# Patient Record
Sex: Female | Born: 1955 | Race: Black or African American | Hispanic: No | Marital: Single | Smoking: Former smoker
Health system: Southern US, Community
[De-identification: ages and names within clinical notes are randomized; demographics above are authoritative.]

## PROBLEM LIST (undated history)

## (undated) DIAGNOSIS — E7800 Pure hypercholesterolemia, unspecified: Secondary | ICD-10-CM

## (undated) DIAGNOSIS — I10 Essential (primary) hypertension: Secondary | ICD-10-CM

## (undated) DIAGNOSIS — E119 Type 2 diabetes mellitus without complications: Secondary | ICD-10-CM

## (undated) DIAGNOSIS — Z944 Liver transplant status: Secondary | ICD-10-CM

## (undated) HISTORY — PX: LIVER TRANSPLANTATION: SUR1389

## (undated) HISTORY — DX: Essential (primary) hypertension: I10

## (undated) HISTORY — DX: Pure hypercholesterolemia, unspecified: E78.00

---

## 1998-04-21 ENCOUNTER — Other Ambulatory Visit: Admission: RE | Admit: 1998-04-21 | Discharge: 1998-04-21 | Payer: Self-pay | Admitting: Gastroenterology

## 1998-12-13 ENCOUNTER — Ambulatory Visit (HOSPITAL_COMMUNITY): Admission: RE | Admit: 1998-12-13 | Discharge: 1998-12-13 | Payer: Self-pay | Admitting: Gastroenterology

## 2001-04-23 ENCOUNTER — Encounter (HOSPITAL_COMMUNITY): Admission: RE | Admit: 2001-04-23 | Discharge: 2001-05-28 | Payer: Self-pay | Admitting: *Deleted

## 2001-05-29 ENCOUNTER — Encounter (HOSPITAL_COMMUNITY): Admission: RE | Admit: 2001-05-29 | Discharge: 2001-08-27 | Payer: Self-pay | Admitting: *Deleted

## 2006-09-05 ENCOUNTER — Ambulatory Visit (HOSPITAL_COMMUNITY): Admission: RE | Admit: 2006-09-05 | Discharge: 2006-09-05 | Payer: Self-pay | Admitting: Internal Medicine

## 2010-05-06 ENCOUNTER — Inpatient Hospital Stay (HOSPITAL_COMMUNITY): Admission: EM | Admit: 2010-05-06 | Discharge: 2010-05-08 | Payer: Self-pay | Admitting: Emergency Medicine

## 2010-09-20 ENCOUNTER — Ambulatory Visit (HOSPITAL_COMMUNITY)
Admission: RE | Admit: 2010-09-20 | Discharge: 2010-09-20 | Payer: Self-pay | Source: Home / Self Care | Admitting: Internal Medicine

## 2011-01-14 LAB — COMPREHENSIVE METABOLIC PANEL
ALT: 14 U/L (ref 0–35)
ALT: 16 U/L (ref 0–35)
AST: 24 U/L (ref 0–37)
Albumin: 3.4 g/dL — ABNORMAL LOW (ref 3.5–5.2)
Alkaline Phosphatase: 112 U/L (ref 39–117)
CO2: 21 mEq/L (ref 19–32)
CO2: 23 mEq/L (ref 19–32)
CO2: 26 mEq/L (ref 19–32)
Calcium: 9.9 mg/dL (ref 8.4–10.5)
Chloride: 108 mEq/L (ref 96–112)
Creatinine, Ser: 0.9 mg/dL (ref 0.4–1.2)
Creatinine, Ser: 1.27 mg/dL — ABNORMAL HIGH (ref 0.4–1.2)
GFR calc Af Amer: >60 mL/min (ref >60)
GFR calc non Af Amer: 44 mL/min — ABNORMAL LOW (ref >60)
GFR calc non Af Amer: >60 mL/min (ref >60)
Glucose, Bld: 292 mg/dL — ABNORMAL HIGH (ref 70–99)
Glucose, Bld: 867 mg/dL (ref 70–99)
Potassium: 3.2 mEq/L — ABNORMAL LOW (ref 3.5–5.1)
Potassium: 4 mEq/L (ref 3.5–5.1)
Sodium: 138 mEq/L (ref 135–145)
Sodium: 140 mEq/L (ref 135–145)
Total Bilirubin: 0.8 mg/dL (ref 0.3–1.2)
Total Bilirubin: 0.9 mg/dL (ref 0.3–1.2)
Total Bilirubin: 1.7 mg/dL — ABNORMAL HIGH (ref 0.3–1.2)

## 2011-01-14 LAB — URINALYSIS, ROUTINE W REFLEX MICROSCOPIC
Bilirubin Urine: NEGATIVE
Glucose, UA: >1000 mg/dL — AB
Leukocytes, UA: NEGATIVE
Protein, ur: NEGATIVE mg/dL

## 2011-01-14 LAB — CBC
HCT: 35.3 % — ABNORMAL LOW (ref 36.0–46.0)
HCT: 44.8 % (ref 36.0–46.0)
Hemoglobin: 12.7 g/dL (ref 12.0–15.0)
Hemoglobin: 14.5 g/dL (ref 12.0–15.0)
Hemoglobin: 15.7 g/dL — ABNORMAL HIGH (ref 12.0–15.0)
MCH: 31.5 pg (ref 26.0–34.0)
MCHC: 35 g/dL (ref 30.0–36.0)
MCV: 88.8 fL (ref 78.0–100.0)
MCV: 89.8 fL (ref 78.0–100.0)
Platelets: 104 10*3/uL — ABNORMAL LOW (ref 150–400)
RBC: 4.59 MIL/uL (ref 3.87–5.11)
WBC: 3.3 10*3/uL — ABNORMAL LOW (ref 4.0–10.5)
WBC: 7 10*3/uL (ref 4.0–10.5)

## 2011-01-14 LAB — GLUCOSE, CAPILLARY
Glucose-Capillary: 128 mg/dL — ABNORMAL HIGH (ref 70–99)
Glucose-Capillary: 132 mg/dL — ABNORMAL HIGH (ref 70–99)
Glucose-Capillary: 137 mg/dL — ABNORMAL HIGH (ref 70–99)
Glucose-Capillary: 151 mg/dL — ABNORMAL HIGH (ref 70–99)
Glucose-Capillary: 175 mg/dL — ABNORMAL HIGH (ref 70–99)
Glucose-Capillary: 193 mg/dL — ABNORMAL HIGH (ref 70–99)
Glucose-Capillary: 228 mg/dL — ABNORMAL HIGH (ref 70–99)
Glucose-Capillary: 237 mg/dL — ABNORMAL HIGH (ref 70–99)
Glucose-Capillary: 272 mg/dL — ABNORMAL HIGH (ref 70–99)
Glucose-Capillary: 288 mg/dL — ABNORMAL HIGH (ref 70–99)
Glucose-Capillary: 322 mg/dL — ABNORMAL HIGH (ref 70–99)
Glucose-Capillary: >600 mg/dL (ref 70–99)

## 2011-01-14 LAB — DIFFERENTIAL
Basophils Absolute: 0.1 10*3/uL (ref 0.0–0.1)
Eosinophils Absolute: 0.1 10*3/uL (ref 0.0–0.7)
Lymphocytes Relative: 13 % (ref 12–46)
Lymphs Abs: 0.8 10*3/uL (ref 0.7–4.0)
Neutrophils Relative %: 77 % (ref 43–77)

## 2011-01-14 LAB — APTT: aPTT: 27 seconds (ref 24–37)

## 2011-01-14 LAB — LIPID PANEL
Cholesterol: 180 mg/dL (ref 0–200)
LDL Cholesterol: 65 mg/dL (ref 0–99)
Total CHOL/HDL Ratio: 4.9 RATIO
Triglycerides: 389 mg/dL — ABNORMAL HIGH (ref <150)

## 2011-01-14 LAB — URINE CULTURE

## 2011-01-14 LAB — PROTIME-INR: Prothrombin Time: 13.7 seconds (ref 11.6–15.2)

## 2011-01-14 LAB — LIPASE, BLOOD: Lipase: 36 U/L (ref 11–59)

## 2011-01-14 LAB — MICROALBUMIN, URINE: Microalb, Ur: 2.82 mg/dL — ABNORMAL HIGH (ref 0.00–1.89)

## 2011-01-14 LAB — MAGNESIUM: Magnesium: 1.7 mg/dL (ref 1.5–2.5)

## 2011-10-04 ENCOUNTER — Other Ambulatory Visit (HOSPITAL_COMMUNITY): Payer: Self-pay | Admitting: Family Medicine

## 2011-10-04 DIAGNOSIS — Z1231 Encounter for screening mammogram for malignant neoplasm of breast: Secondary | ICD-10-CM

## 2011-11-12 ENCOUNTER — Ambulatory Visit (HOSPITAL_COMMUNITY)
Admission: RE | Admit: 2011-11-12 | Discharge: 2011-11-12 | Disposition: A | Discharge status: Home / Self Care | Payer: Medicare Other | Source: Ambulatory Visit | Attending: Family Medicine | Admitting: Family Medicine

## 2011-11-12 DIAGNOSIS — Z1231 Encounter for screening mammogram for malignant neoplasm of breast: Secondary | ICD-10-CM

## 2013-07-11 ENCOUNTER — Encounter (HOSPITAL_COMMUNITY): Payer: Self-pay | Admitting: Emergency Medicine

## 2013-07-11 ENCOUNTER — Inpatient Hospital Stay (HOSPITAL_COMMUNITY)
Admission: EM | Admit: 2013-07-11 | Discharge: 2013-07-13 | DRG: 638 | Disposition: A | Discharge status: Home / Self Care | Payer: Medicare Other | Attending: Internal Medicine | Admitting: Internal Medicine

## 2013-07-11 ENCOUNTER — Emergency Department (HOSPITAL_COMMUNITY): Payer: Medicare Other

## 2013-07-11 DIAGNOSIS — Z944 Liver transplant status: Secondary | ICD-10-CM

## 2013-07-11 DIAGNOSIS — E1165 Type 2 diabetes mellitus with hyperglycemia: Secondary | ICD-10-CM

## 2013-07-11 DIAGNOSIS — E860 Dehydration: Secondary | ICD-10-CM | POA: Diagnosis present

## 2013-07-11 DIAGNOSIS — Z794 Long term (current) use of insulin: Secondary | ICD-10-CM

## 2013-07-11 DIAGNOSIS — IMO0001 Reserved for inherently not codable concepts without codable children: Principal | ICD-10-CM | POA: Diagnosis present

## 2013-07-11 DIAGNOSIS — R7309 Other abnormal glucose: Secondary | ICD-10-CM

## 2013-07-11 DIAGNOSIS — B1920 Unspecified viral hepatitis C without hepatic coma: Secondary | ICD-10-CM | POA: Diagnosis present

## 2013-07-11 DIAGNOSIS — Z79899 Other long term (current) drug therapy: Secondary | ICD-10-CM

## 2013-07-11 DIAGNOSIS — R739 Hyperglycemia, unspecified: Secondary | ICD-10-CM

## 2013-07-11 HISTORY — DX: Type 2 diabetes mellitus without complications: E11.9

## 2013-07-11 LAB — CBC WITH DIFFERENTIAL/PLATELET
Basophils Relative: 0 % (ref 0–1)
Eosinophils Absolute: 0.2 10*3/uL (ref 0.0–0.7)
Eosinophils Relative: 2 % (ref 0–5)
HCT: 42.3 % (ref 36.0–46.0)
Lymphocytes Relative: 23 % (ref 12–46)
Lymphs Abs: 1.9 10*3/uL (ref 0.7–4.0)
Monocytes Absolute: 0.6 10*3/uL (ref 0.1–1.0)
Neutro Abs: 5.7 10*3/uL (ref 1.7–7.7)
RBC: 4.94 MIL/uL (ref 3.87–5.11)
RDW: 13.5 % (ref 11.5–15.5)
WBC: 8.4 10*3/uL (ref 4.0–10.5)

## 2013-07-11 LAB — URINALYSIS, ROUTINE W REFLEX MICROSCOPIC
Bilirubin Urine: NEGATIVE
Glucose, UA: >1000 mg/dL — AB
Ketones, ur: NEGATIVE mg/dL
Protein, ur: NEGATIVE mg/dL
Urobilinogen, UA: 0.2 mg/dL (ref 0.0–1.0)

## 2013-07-11 LAB — COMPREHENSIVE METABOLIC PANEL
BUN: 25 mg/dL — ABNORMAL HIGH (ref 6–23)
CO2: 21 mEq/L (ref 19–32)
Calcium: 9.2 mg/dL (ref 8.4–10.5)
Creatinine, Ser: 0.93 mg/dL (ref 0.50–1.10)
GFR calc Af Amer: 78 mL/min — ABNORMAL LOW (ref >90)
GFR calc non Af Amer: 67 mL/min — ABNORMAL LOW (ref >90)
Glucose, Bld: 654 mg/dL (ref 70–99)
Sodium: 128 mEq/L — ABNORMAL LOW (ref 135–145)
Total Protein: 8.4 g/dL — ABNORMAL HIGH (ref 6.0–8.3)

## 2013-07-11 LAB — BASIC METABOLIC PANEL
BUN: 17 mg/dL (ref 6–23)
CO2: 24 mEq/L (ref 19–32)
Calcium: 8.6 mg/dL (ref 8.4–10.5)
Creatinine, Ser: 0.74 mg/dL (ref 0.50–1.10)
Glucose, Bld: 248 mg/dL — ABNORMAL HIGH (ref 70–99)
Potassium: 3.2 mEq/L — ABNORMAL LOW (ref 3.5–5.1)
Sodium: 138 mEq/L (ref 135–145)

## 2013-07-11 LAB — CBC
Hemoglobin: 14.1 g/dL (ref 12.0–15.0)
MCH: 30.6 pg (ref 26.0–34.0)
MCHC: 35.8 g/dL (ref 30.0–36.0)
RDW: 13.4 % (ref 11.5–15.5)

## 2013-07-11 LAB — GLUCOSE, CAPILLARY
Glucose-Capillary: >600 mg/dL (ref 70–99)
Glucose-Capillary: 430 mg/dL — ABNORMAL HIGH (ref 70–99)
Glucose-Capillary: 197 mg/dL — ABNORMAL HIGH (ref 70–99)

## 2013-07-11 LAB — URINE MICROSCOPIC-ADD ON

## 2013-07-11 MED ORDER — SODIUM CHLORIDE 0.9 % IV BOLUS (SEPSIS)
1000.0000 mL | Freq: Once | INTRAVENOUS | Status: AC
  Administered 2013-07-11: 1000 mL via INTRAVENOUS

## 2013-07-11 MED ORDER — ENOXAPARIN SODIUM 40 MG/0.4ML ~~LOC~~ SOLN
40.0000 mg | Freq: Every day | SUBCUTANEOUS | Status: DC
  Administered 2013-07-11 – 2013-07-12 (×2): 40 mg via SUBCUTANEOUS
  Filled 2013-07-11 (×3): qty 0.4

## 2013-07-11 MED ORDER — ASPIRIN 81 MG PO CHEW
81.0000 mg | CHEWABLE_TABLET | Freq: Every day | ORAL | Status: DC
  Administered 2013-07-12 – 2013-07-13 (×2): 81 mg via ORAL
  Filled 2013-07-11 (×2): qty 1

## 2013-07-11 MED ORDER — POTASSIUM CHLORIDE CRYS ER 20 MEQ PO TBCR
40.0000 meq | EXTENDED_RELEASE_TABLET | Freq: Once | ORAL | Status: AC
  Administered 2013-07-11: 40 meq via ORAL
  Filled 2013-07-11: qty 2

## 2013-07-11 MED ORDER — DEXTROSE-NACL 5-0.45 % IV SOLN
INTRAVENOUS | Status: DC
  Administered 2013-07-11: 21:00:00 via INTRAVENOUS

## 2013-07-11 MED ORDER — AMLODIPINE BESYLATE 10 MG PO TABS
10.0000 mg | ORAL_TABLET | Freq: Every day | ORAL | Status: DC
  Administered 2013-07-12 – 2013-07-13 (×2): 10 mg via ORAL
  Filled 2013-07-11 (×2): qty 1

## 2013-07-11 MED ORDER — AZATHIOPRINE 50 MG PO TABS
50.0000 mg | ORAL_TABLET | Freq: Every day | ORAL | Status: DC
  Administered 2013-07-12 – 2013-07-13 (×2): 50 mg via ORAL
  Filled 2013-07-11 (×3): qty 1

## 2013-07-11 MED ORDER — SODIUM CHLORIDE 0.9 % IV SOLN
INTRAVENOUS | Status: DC
  Administered 2013-07-11: 5.9 [IU]/h via INTRAVENOUS
  Administered 2013-07-11: 5.2 [IU]/h via INTRAVENOUS
  Administered 2013-07-11: 2.8 [IU]/h via INTRAVENOUS
  Administered 2013-07-11: 5 [IU]/h via INTRAVENOUS
  Filled 2013-07-11: qty 1

## 2013-07-11 MED ORDER — INSULIN ASPART PROT & ASPART (70-30 MIX) 100 UNIT/ML ~~LOC~~ SUSP
15.0000 [IU] | Freq: Once | SUBCUTANEOUS | Status: DC
  Filled 2013-07-11: qty 10

## 2013-07-11 MED ORDER — PROPRANOLOL HCL 20 MG PO TABS
20.0000 mg | ORAL_TABLET | Freq: Two times a day (BID) | ORAL | Status: DC
  Administered 2013-07-11 – 2013-07-13 (×4): 20 mg via ORAL
  Filled 2013-07-11 (×5): qty 1

## 2013-07-11 MED ORDER — DEXTROSE 50 % IV SOLN: 25.0000 mL | INTRAVENOUS | Status: DC | PRN

## 2013-07-11 MED ORDER — TACROLIMUS 1 MG PO CAPS
1.0000 mg | ORAL_CAPSULE | Freq: Two times a day (BID) | ORAL | Status: DC
  Administered 2013-07-11 – 2013-07-12 (×2): 1 mg via ORAL
  Filled 2013-07-11 (×3): qty 1

## 2013-07-11 MED ORDER — SODIUM CHLORIDE 0.9 % IV SOLN
INTRAVENOUS | Status: DC
  Filled 2013-07-11 (×2): qty 1000

## 2013-07-11 MED ORDER — PANTOPRAZOLE SODIUM 40 MG PO TBEC
40.0000 mg | DELAYED_RELEASE_TABLET | Freq: Every day | ORAL | Status: DC
  Administered 2013-07-12 – 2013-07-13 (×2): 40 mg via ORAL
  Filled 2013-07-11 (×2): qty 1

## 2013-07-11 MED ORDER — ADULT MULTIVITAMIN W/MINERALS CH
1.0000 | ORAL_TABLET | Freq: Every day | ORAL | Status: DC
  Administered 2013-07-12 – 2013-07-13 (×2): 1 via ORAL
  Filled 2013-07-11 (×2): qty 1

## 2013-07-11 MED ORDER — SODIUM CHLORIDE 0.9 % IV SOLN
INTRAVENOUS | Status: DC
  Administered 2013-07-11: 20:00:00 via INTRAVENOUS

## 2013-07-11 MED ORDER — PREDNISONE 5 MG PO TABS
5.0000 mg | ORAL_TABLET | Freq: Every day | ORAL | Status: DC
  Administered 2013-07-12 – 2013-07-13 (×2): 5 mg via ORAL
  Filled 2013-07-11 (×3): qty 1

## 2013-07-11 MED ORDER — POTASSIUM CHLORIDE 10 MEQ/100ML IV SOLN
10.0000 meq | INTRAVENOUS | Status: AC
  Administered 2013-07-11 (×2): 10 meq via INTRAVENOUS
  Filled 2013-07-11: qty 100

## 2013-07-11 NOTE — ED Notes (Signed)
Critical lab value CBG 654

## 2013-07-11 NOTE — ED Provider Notes (Signed)
CSN: [id number]     Arrival date & time 07/11/13  1427 History   First MD Initiated Contact with Patient 07/11/13 1504     Chief Complaint  Patient presents with  . Hyperglycemia   (Consider location/radiation/quality/duration/timing/severity/associated sxs/prior Treatment) HPI Comments: Is a 57 year old female with history of diabetes, liver transplant, hepatitis C Presents today with 2 weeks of home a blood sugar readings of high. She has been measuring her blood sugar 3 times daily and each time her blood sugar has been too high to read. She takes 50 units of Lantus at bedtime and NovoLog on a sliding scale. She's been compliant with these medications. She sees a endocrinologist at [facility]. Her next appointment is in October. She reports no recent illness. No new medications. She did recently start taking a multivitamin one month ago. She is symptomatic with polydipsia. No polyuria or polyphagia. She has had a decrease in appetite. Today she ate shrimp, salad, a baked potato.  The history is provided by the patient. No language interpreter was used.    Past Medical History  Diagnosis Date  . Diabetes mellitus without complication    Past Surgical History  Procedure Laterality Date  . Liver transplantation     No family history on file. History  Substance Use Topics  . Smoking status: Not on file  . Smokeless tobacco: Not on file  . Alcohol Use: Not on file   OB History   Grav Para Term Preterm Abortions TAB SAB Ect Mult Living                 Review of Systems  Constitutional: Positive for appetite change. Negative for fever and chills.  Endocrine: Positive for polydipsia. Negative for polyphagia and polyuria.  All other systems reviewed and are negative.    Allergies  Review of patient's allergies indicates no known allergies.  Home Medications   Current Outpatient Rx  Name  Route  Sig  Dispense  Refill  . amLODipine (NORVASC) 10 MG tablet   Oral   Take 10 mg by  mouth daily.         [name] aspirin 81 MG chewable tablet   Oral   Chew 81 mg by mouth daily.         [name] azaTHIOprine (IMURAN) 50 MG tablet   Oral   Take 50 mg by mouth daily.         . insulin aspart (NOVOLOG) 100 UNIT/ML injection   Subcutaneous   Inject 2-6 Units into the skin 3 (three) times daily with meals. Per sliding scale         . insulin glargine (LANTUS) 100 UNIT/ML injection   Subcutaneous   Inject 0-55 Units into the skin at bedtime. Per sliding scale         . Multiple Vitamin (MULTIVITAMIN WITH MINERALS) TABS tablet   Oral   Take 1 tablet by mouth daily.         [name] omeprazole (PRILOSEC) 20 MG capsule   Oral   Take 20 mg by mouth daily.         . potassium chloride SA (K-DUR,KLOR-CON) 20 MEQ tablet   Oral   Take 20 mEq by mouth daily.         . predniSONE (DELTASONE) 5 MG tablet   Oral   Take 5 mg by mouth daily.         . propranolol (INDERAL) 20 MG tablet   Oral   Take 20 mg by  mouth 2 (two) times daily.         . tacrolimus (PROGRAF) 1 MG capsule   Oral   Take 1 mg by mouth 2 (two) times daily.          BP 134/91  Pulse 89  Temp(Src) 98.5 F (36.9 C) (Oral)  Resp 16  SpO2 97% Physical Exam  Nursing note and vitals reviewed. Constitutional: She is oriented to person, place, and time. She appears well-developed and well-nourished. No distress.  HENT:  Head: Normocephalic and atraumatic.  Right Ear: External ear normal.  Left Ear: External ear normal.  Nose: Nose normal.  Mouth/Throat: Oropharynx is clear and moist. Mucous membranes are dry.  Eyes: Conjunctivae are normal.  Neck: Normal range of motion.  Cardiovascular: Normal rate, regular rhythm and normal heart sounds.   Pulmonary/Chest: Effort normal and breath sounds normal. No stridor. No respiratory distress. She has no wheezes. She has no rales.  Abdominal: Soft. She exhibits no distension.  Musculoskeletal: Normal range of motion.  Neurological: She is alert and  oriented to person, place, and time. She has normal strength.  Skin: Skin is warm and dry. She is not diaphoretic. No erythema.  Psychiatric: She has a normal mood and affect. Her behavior is normal.    ED Course  Procedures (including critical care time) Labs Review Labs Reviewed  GLUCOSE, CAPILLARY - Abnormal; Notable for the following:    Glucose-Capillary >600 (*)    All other components within normal limits  CBC WITH DIFFERENTIAL - Abnormal; Notable for the following:    Hemoglobin 15.4 (*)    MCHC 36.4 (*)    Platelets 130 (*)    All other components within normal limits  URINALYSIS, ROUTINE W REFLEX MICROSCOPIC - Abnormal; Notable for the following:    Specific Gravity, Urine 1.033 (*)    Glucose, UA >1000 (*)    Hgb urine dipstick TRACE (*)    All other components within normal limits  COMPREHENSIVE METABOLIC PANEL - Abnormal; Notable for the following:    Sodium 128 (*)    Chloride 92 (*)    Glucose, Bld 654 (*)    BUN 25 (*)    Total Protein 8.4 (*)    Alkaline Phosphatase 196 (*)    GFR calc non Af Amer 67 (*)    GFR calc Af Amer 78 (*)    All other components within normal limits  GLUCOSE, CAPILLARY - Abnormal; Notable for the following:    Glucose-Capillary 430 (*)    All other components within normal limits  GLUCOSE, CAPILLARY - Abnormal; Notable for the following:    Glucose-Capillary 340 (*)    All other components within normal limits  URINE MICROSCOPIC-ADD ON   Imaging Review Dg Chest 2 View  07/11/2013   *RADIOLOGY REPORT*  Clinical Data: Hyperglycemic, hypertension.  CHEST - 2 VIEW  Comparison: May 07, 2010.  Findings: Cardiomediastinal silhouette appears normal.  No acute pulmonary disease is noted.  Bony thorax is intact.  IMPRESSION: No acute cardiopulmonary abnormality seen.   Original Report Authenticated By: Lupita Raider.,  M.D.    MDM  No diagnosis found.  Patient presents to ED with 2 weeks of hyperglycemia. On arrival CBG >600. She has  been taking both her lantus and novalog at home. No new medications, no recent illness. Patient was placed on glucose stabilizer in ED to which she responded well. Discussed case with Dr. Elvera Lennox who agrees to admit to step down. Admission appreciated. Discussed case  with Dr. Denton Lank who agrees with plan. Vital signs stable. Patient / Family / Caregiver informed of clinical course, understand medical decision-making process, and agree with plan.     Mora Bellman, PA-C 07/11/13 [phone]

## 2013-07-11 NOTE — H&P (Signed)
[facility] History and Physical  Kathryn Walsh GNF:[id number] DOB: 04-19-1956 DOA: 07/11/2013  Referring physician: Dr. Denton Lank PCP: No primary provider on file.  Specialists: none  Chief Complaint: elevated blood sugars  HPI: Kathryn Walsh is a 57 y.o. female has a past medical history significant for insulin-dependent diabetes, liver transplant in 1999, hepatitis C, presents today with a chief complaint of high blood sugars that she hasn't been able to control over the past 2 weeks. She usually takes Lantus sliding scale, and despite her reported compliance with all these medications her sugars have been running in the 3 to 400s over the past couple weeks. She denies any fever or chills. She endorses mild nausea but denies vomiting. She has no abdominal pain. She denies shortness of breath. She had an episode like this a few years back that required hospitalization. She endorses increased thirst. She has no chest pain. She has no lightheadedness or dizziness. She denies any dysuria. She reports no medication changes. She stores her insulin in the refrigerator, and her recent insulin vial is new. No dietary changes.   Review of Systems: as per history of present illness, otherwise negative  Past Medical History  Diagnosis Date  . Diabetes mellitus without complication    Past Surgical History  Procedure Laterality Date  . Liver transplantation     Social History:  has no tobacco, alcohol, and drug history on file.  No Known Allergies  Family history non contributory  Prior to Admission medications   Medication Sig Start Date End Date Taking? Authorizing Provider  amLODipine (NORVASC) 10 MG tablet Take 10 mg by mouth daily.   Yes Historical Provider, MD  aspirin 81 MG chewable tablet Chew 81 mg by mouth daily.   Yes Historical Provider, MD  azaTHIOprine (IMURAN) 50 MG tablet Take 50 mg by mouth daily.   Yes Historical Provider, MD  insulin aspart (NOVOLOG) 100 UNIT/ML  injection Inject 2-6 Units into the skin 3 (three) times daily with meals. Per sliding scale   Yes Historical Provider, MD  insulin glargine (LANTUS) 100 UNIT/ML injection Inject 0-55 Units into the skin at bedtime. Per sliding scale   Yes Historical Provider, MD  Multiple Vitamin (MULTIVITAMIN WITH MINERALS) TABS tablet Take 1 tablet by mouth daily.   Yes Historical Provider, MD  omeprazole (PRILOSEC) 20 MG capsule Take 20 mg by mouth daily.   Yes Historical Provider, MD  potassium chloride SA (K-DUR,KLOR-CON) 20 MEQ tablet Take 20 mEq by mouth daily.   Yes Historical Provider, MD  predniSONE (DELTASONE) 5 MG tablet Take 5 mg by mouth daily.   Yes Historical Provider, MD  propranolol (INDERAL) 20 MG tablet Take 20 mg by mouth 2 (two) times daily.   Yes Historical Provider, MD  tacrolimus (PROGRAF) 1 MG capsule Take 1 mg by mouth 2 (two) times daily.   Yes Historical Provider, MD   Physical Exam: Filed Vitals:   07/11/13 1442 07/11/13 2005  BP: 134/91 125/73  Pulse: 89 79  Temp: 98.5 F (36.9 C) 98.6 F (37 C)  TempSrc: Oral Oral  Resp: 16 17  SpO2: 97% 96%     General:  No apparent distress  Eyes: PERRL, EOMI, no scleral icterus  ENT: moist oropharynx  Neck: supple, no JVD  Cardiovascular: regular rate without MRG; 2+ peripheral pulses  Respiratory: CTA biL, good air movement without wheezing, rhonchi or crackles  Abdomen: soft, non tender to palpation, positive bowel sounds, no guarding, no rebound  Skin: no rashes  Musculoskeletal:  no peripheral edema  Psychiatric: normal mood and affect  Neurologic: non focal  Labs on Admission:  Basic Metabolic Panel:  Recent Labs Lab 07/11/13 1518  NA 128*  K 4.3  CL 92*  CO2 21  GLUCOSE 654*  BUN 25*  CREATININE 0.93  CALCIUM 9.2   Liver Function Tests:  Recent Labs Lab 07/11/13 1518  AST 14  ALT 9  ALKPHOS 196*  BILITOT 0.6  PROT 8.4*  ALBUMIN 3.7   CBC:  Recent Labs Lab 07/11/13 1515  WBC 8.4   NEUTROABS 5.7  HGB 15.4*  HCT 42.3  MCV 85.6  PLT 130*   CBG:  Recent Labs Lab 07/11/13 1449 07/11/13 1731 07/11/13 1816 07/11/13 1943  GLUCAP >600* 430* 340* 321*    Radiological Exams on Admission: Dg Chest 2 View  07/11/2013   *RADIOLOGY REPORT*  Clinical Data: Hyperglycemic, hypertension.  CHEST - 2 VIEW  Comparison: May 07, 2010.  Findings: Cardiomediastinal silhouette appears normal.  No acute pulmonary disease is noted.  Bony thorax is intact.  IMPRESSION: No acute cardiopulmonary abnormality seen.   Original Report Authenticated By: Lupita Raider.,  M.D.    EKG: Independently reviewed.  Assessment/Plan Active Problems:   S/P liver transplant   HCV (hepatitis C virus)   Type II diabetes mellitus, uncontrolled   Hyperglycemia  Hyperglycemia - insulin stabilizer, SDU - bicarb 21, no ketones in urine. - unclear precipitant, no evidence of infection, UA clear, CXR normal, afebrile, no leukocytosis.  DM - check HBA1C, insulin drip, transition to Lantus once sugars better. NPO for now. S/p Liver tx - continue home medications, check tacrolimus level. Followed as an outpatient at [facility]. DVT prophylaxis - lovenox  Code Status: Full  Family Communication: none  Disposition Plan: SDU  Time spent: 20  Costin M. Elvera Lennox, MD [facility] Pager [phone]  If 7PM-7AM, please contact night-coverage [email or web address] Password [facility] 07/11/2013, 8:31 PM

## 2013-07-11 NOTE — ED Notes (Signed)
Pt here for c/o blood elevated x2 weeks even after taking scheduled Insulin denies illness blood sugar over 600

## 2013-07-12 ENCOUNTER — Encounter (HOSPITAL_COMMUNITY): Payer: Self-pay | Admitting: *Deleted

## 2013-07-12 DIAGNOSIS — B1920 Unspecified viral hepatitis C without hepatic coma: Secondary | ICD-10-CM

## 2013-07-12 LAB — BASIC METABOLIC PANEL
BUN: 14 mg/dL (ref 6–23)
BUN: 15 mg/dL (ref 6–23)
CO2: 23 mEq/L (ref 19–32)
CO2: 23 mEq/L (ref 19–32)
Calcium: 8.5 mg/dL (ref 8.4–10.5)
Calcium: 8.6 mg/dL (ref 8.4–10.5)
Chloride: 105 mEq/L (ref 96–112)
Creatinine, Ser: 0.58 mg/dL (ref 0.50–1.10)
Creatinine, Ser: 0.63 mg/dL (ref 0.50–1.10)
Creatinine, Ser: 0.64 mg/dL (ref 0.50–1.10)
GFR calc non Af Amer: >90 mL/min (ref >90)
Glucose, Bld: 141 mg/dL — ABNORMAL HIGH (ref 70–99)
Glucose, Bld: 180 mg/dL — ABNORMAL HIGH (ref 70–99)
Potassium: 3.4 mEq/L — ABNORMAL LOW (ref 3.5–5.1)
Potassium: 3.7 mEq/L (ref 3.5–5.1)

## 2013-07-12 LAB — GLUCOSE, CAPILLARY
Glucose-Capillary: 220 mg/dL — ABNORMAL HIGH (ref 70–99)
Glucose-Capillary: 187 mg/dL — ABNORMAL HIGH (ref 70–99)
Glucose-Capillary: 131 mg/dL — ABNORMAL HIGH (ref 70–99)
Glucose-Capillary: 158 mg/dL — ABNORMAL HIGH (ref 70–99)
Glucose-Capillary: 359 mg/dL — ABNORMAL HIGH (ref 70–99)
Glucose-Capillary: 339 mg/dL — ABNORMAL HIGH (ref 70–99)
Glucose-Capillary: 270 mg/dL — ABNORMAL HIGH (ref 70–99)

## 2013-07-12 LAB — HEMOGLOBIN A1C: Hgb A1c MFr Bld: 11.8 % — ABNORMAL HIGH (ref <5.7)

## 2013-07-12 MED ORDER — LIVING WELL WITH DIABETES BOOK
Freq: Once | Status: AC
  Administered 2013-07-12: 06:00:00
  Filled 2013-07-12: qty 1

## 2013-07-12 MED ORDER — PNEUMOCOCCAL VAC POLYVALENT 25 MCG/0.5ML IJ INJ
0.5000 mL | INJECTION | INTRAMUSCULAR | Status: AC
  Administered 2013-07-13: 0.5 mL via INTRAMUSCULAR
  Filled 2013-07-12 (×2): qty 0.5

## 2013-07-12 MED ORDER — TACROLIMUS 1 MG PO CAPS
3.0000 mg | ORAL_CAPSULE | Freq: Two times a day (BID) | ORAL | Status: DC
  Administered 2013-07-12 – 2013-07-13 (×2): 3 mg via ORAL
  Filled 2013-07-12 (×3): qty 3

## 2013-07-12 MED ORDER — SODIUM CHLORIDE 0.9 % IV SOLN: INTRAVENOUS | Status: DC

## 2013-07-12 MED ORDER — INSULIN GLARGINE 100 UNIT/ML ~~LOC~~ SOLN
60.0000 [IU] | Freq: Every day | SUBCUTANEOUS | Status: DC
  Administered 2013-07-12: 60 [IU] via SUBCUTANEOUS
  Filled 2013-07-12 (×2): qty 0.6

## 2013-07-12 MED ORDER — TACROLIMUS 1 MG PO CAPS
2.0000 mg | ORAL_CAPSULE | Freq: Once | ORAL | Status: AC
  Administered 2013-07-12: 2 mg via ORAL
  Filled 2013-07-12: qty 2

## 2013-07-12 MED ORDER — INSULIN ASPART 100 UNIT/ML ~~LOC~~ SOLN
0.0000 [IU] | Freq: Three times a day (TID) | SUBCUTANEOUS | Status: DC
  Administered 2013-07-12: 5 [IU] via SUBCUTANEOUS
  Administered 2013-07-12: 11 [IU] via SUBCUTANEOUS
  Administered 2013-07-12: 15 [IU] via SUBCUTANEOUS
  Administered 2013-07-12: 3 [IU] via SUBCUTANEOUS
  Administered 2013-07-13: 8 [IU] via SUBCUTANEOUS
  Administered 2013-07-13: 5 [IU] via SUBCUTANEOUS

## 2013-07-12 MED ORDER — INSULIN GLARGINE 100 UNIT/ML ~~LOC~~ SOLN
10.0000 [IU] | Freq: Once | SUBCUTANEOUS | Status: AC
  Administered 2013-07-12: 10 [IU] via SUBCUTANEOUS
  Filled 2013-07-12: qty 0.1

## 2013-07-12 MED ORDER — INSULIN ASPART 100 UNIT/ML ~~LOC~~ SOLN
0.0000 [IU] | Freq: Every day | SUBCUTANEOUS | Status: DC
  Administered 2013-07-12: 3 [IU] via SUBCUTANEOUS

## 2013-07-12 MED ORDER — HYDROCODONE-ACETAMINOPHEN 7.5-325 MG/15ML PO SOLN
10.0000 mL | Freq: Four times a day (QID) | ORAL | Status: DC | PRN
  Administered 2013-07-12: 10 mL via ORAL
  Filled 2013-07-12: qty 15

## 2013-07-12 MED ORDER — SODIUM CHLORIDE 0.9 % IV SOLN
INTRAVENOUS | Status: DC
  Administered 2013-07-12: 1000 mL via INTRAVENOUS

## 2013-07-12 NOTE — Progress Notes (Signed)
[facility] PROGRESS NOTE  Kathryn Walsh UEA:[id number] DOB: August 23, 1956 DOA: 07/11/2013 PCP: No primary provider on file.  Assessment/Plan: 1. Uncontrolled Hyperglycemia -trigger unclear, no obvious source of infection -off glucomander -Resume Lantus, increase dose to 60Units QHS, takes 50units at home -Novolog SSI, resume diet -Fu hbaic -DM coordinator consult -continue IVF-NS for 6 more hours  2. S/p Liver transplant -continue Prograf takes 3mg  BID  -Followed by Isidor Holts at [facility], next FU later this month  3. Dehydration -continue IVF today, decrease rate  DVT proph: lovenox  Code Status: Full Family Communication: none at bedside Disposition Plan: TX to floor, home tomorrow     HPI/Subjective: Feels good, hungry, no complaints  Objective: Filed Vitals:   07/12/13 0805  BP:   Pulse:   Temp: 98.1 F (36.7 C)  Resp:     Intake/Output Summary (Last 24 hours) at 07/12/13 1023 Last data filed at 07/12/13 0900  Gross per 24 hour  Intake 1437.05 ml  Output    750 ml  Net 687.05 ml   Filed Weights   07/11/13 2005 07/12/13 0400  Weight: 94.2 kg (207 lb 10.8 oz) 95.5 kg (210 lb 8.6 oz)    Exam:   General: AAOx3, no distress  Cardiovascular:S1S2/RRR  Respiratory: CTAB  Chest: small draining sinus in R inframammary area  Abdomen: soft, NT, BS present  Musculoskeletal:no edema c/c  Data Reviewed: Basic Metabolic Panel:  Recent Labs Lab 07/11/13 1518 07/11/13 2055 07/11/13 2330 07/12/13 0127 07/12/13 0520  NA 128* 138 137 140 138  K 4.3 3.2* 3.2* 3.4* 3.7  CL 92* 104 105 109 105  CO2 21 24 23 21 23   GLUCOSE 654* 248* 180* 141* 177*  BUN 25* 17 15 14 13   CREATININE 0.93 0.74 0.64 0.58 0.63  CALCIUM 9.2 8.6 8.5 8.6 8.9   Liver Function Tests:  Recent Labs Lab 07/11/13 1518  AST 14  ALT 9  ALKPHOS 196*  BILITOT 0.6  PROT 8.4*  ALBUMIN 3.7   No results found for this basename: LIPASE, AMYLASE,  in the last 168  hours No results found for this basename: AMMONIA,  in the last 168 hours CBC:  Recent Labs Lab 07/11/13 1515 07/11/13 2055  WBC 8.4 5.0  NEUTROABS 5.7  --   HGB 15.4* 14.1  HCT 42.3 39.4  MCV 85.6 85.5  PLT 130* 94*   Cardiac Enzymes: No results found for this basename: CKTOTAL, CKMB, CKMBINDEX, TROPONINI,  in the last 168 hours BNP (last 3 results) No results found for this basename: PROBNP,  in the last 8760 hours CBG:  Recent Labs Lab 07/11/13 2356 07/12/13 0059 07/12/13 0202 07/12/13 0306 07/12/13 0831  GLUCAP 177* 135* 131* 144* 226*    Recent Results (from the past 240 hour(s))  MRSA PCR SCREENING     Status: None   Collection Time    07/11/13  8:03 PM      Result Value Range Status   MRSA by PCR NEGATIVE  NEGATIVE Final   Comment:            The GeneXpert MRSA Assay (FDA     approved for NASAL specimens     only), is one component of a     comprehensive MRSA colonization     surveillance program. It is not     intended to diagnose MRSA     infection nor to guide or     monitor treatment for     MRSA infections.  Studies: Dg Chest 2 View  07/11/2013   *RADIOLOGY REPORT*  Clinical Data: Hyperglycemic, hypertension.  CHEST - 2 VIEW  Comparison: May 07, 2010.  Findings: Cardiomediastinal silhouette appears normal.  No acute pulmonary disease is noted.  Bony thorax is intact.  IMPRESSION: No acute cardiopulmonary abnormality seen.   Original Report Authenticated By: Lupita Raider.,  M.D.    Scheduled Meds: . amLODipine  10 mg Oral Daily  . aspirin  81 mg Oral Daily  . azaTHIOprine  50 mg Oral Daily  . enoxaparin (LOVENOX) injection  40 mg Subcutaneous QHS  . insulin aspart  0-15 Units Subcutaneous TID WC  . insulin aspart  0-5 Units Subcutaneous QHS  . insulin glargine  60 Units Subcutaneous QHS  . multivitamin with minerals  1 tablet Oral Daily  . pantoprazole  40 mg Oral Daily  . [START ON 07/13/2013] pneumococcal 23 valent vaccine  0.5 mL  Intramuscular Tomorrow-1000  . predniSONE  5 mg Oral Q breakfast  . propranolol  20 mg Oral BID  . tacrolimus  3 mg Oral BID   Continuous Infusions: . sodium chloride      Active Problems:   S/P liver transplant   HCV (hepatitis C virus)   Type II diabetes mellitus, uncontrolled   Hyperglycemia    Time spent:    [facility]  [facility] Pager [phone]. If 7PM-7AM, please contact night-coverage at [email or web address], password [facility] 07/12/2013, 10:23 AM  LOS: 1 day

## 2013-07-13 LAB — BASIC METABOLIC PANEL
CO2: 21 mEq/L (ref 19–32)
Chloride: 105 mEq/L (ref 96–112)
Glucose, Bld: 235 mg/dL — ABNORMAL HIGH (ref 70–99)
Potassium: 3.4 mEq/L — ABNORMAL LOW (ref 3.5–5.1)
Sodium: 136 mEq/L (ref 135–145)

## 2013-07-13 LAB — CBC
Hemoglobin: 12.7 g/dL (ref 12.0–15.0)
Platelets: 79 10*3/uL — ABNORMAL LOW (ref 150–400)
RBC: 4.25 MIL/uL (ref 3.87–5.11)
WBC: 3.1 10*3/uL — ABNORMAL LOW (ref 4.0–10.5)

## 2013-07-13 LAB — GLUCOSE, CAPILLARY: Glucose-Capillary: 233 mg/dL — ABNORMAL HIGH (ref 70–99)

## 2013-07-13 MED ORDER — INSULIN ASPART 100 UNIT/ML ~~LOC~~ SOLN: 8.0000 [IU] | Freq: Three times a day (TID) | SUBCUTANEOUS | Status: DC

## 2013-07-13 MED ORDER — INSULIN GLARGINE 100 UNIT/ML ~~LOC~~ SOLN: 60.0000 [IU] | Freq: Every day | SUBCUTANEOUS | Status: DC

## 2013-07-13 MED ORDER — INSULIN ASPART 100 UNIT/ML ~~LOC~~ SOLN
4.0000 [IU] | Freq: Three times a day (TID) | SUBCUTANEOUS | Status: DC
  Administered 2013-07-13: 4 [IU] via SUBCUTANEOUS

## 2013-07-13 NOTE — Progress Notes (Signed)
Inpatient Diabetes Program Recommendations  AACE/ADA: New Consensus Statement on Inpatient Glycemic Control (2013)  Target Ranges:  Prepandial:   less than 140 mg/dL      Peak postprandial:   less than 180 mg/dL (1-2 hours)      Critically ill patients:  140 - 180 mg/dL   Reason for Visit: Diabetes Consult  57 year-old female with history of diabetes, liver transplant, hepatitis C Presents today with 2 weeks of home a blood sugar readings of high. She has been measuring her blood sugar 3 times daily and each time her blood sugar has been too high to read. She takes 50 units of Lantus at bedtime and NovoLog on a sliding scale.  Pt states she tries to follow good diet and has increased her Novolog at mealtime since blood sugars have been running high. Checks blood sugars at least 3 times/day.  Uses insulin pen for Lantus and Novolog; gives insulin in abdomen most of the time.  Keeps insulin pen at room temp and stores other pens in refrigerator.  States insulin is not on skin, therefore she knows it's "going in." Pt states she would like to change doctors and Clinical research associate stressed importance of f/u in the next week with a MD to manage her diabetes.  She has taken logbook with her to MD appts in the past, but states the last two times she went for her appt, the doctor did not look at her readings.  Results for LESLI, ISSA (MRN [id number]) as of 07/13/2013 11:39  Ref. Range 07/12/2013 08:31 07/12/2013 12:24 07/12/2013 17:10 07/12/2013 21:40 07/13/2013 07:22  Glucose-Capillary Latest Range: 70-99 mg/dL 409 (H) 811 (H) 914 (H) 270 (H) 233 (H)  Results for LLEWELLYN, SCHOENBERGER (MRN [id number]) as of 07/13/2013 11:39  Ref. Range 07/11/2013 20:55  Hemoglobin A1C Latest Range: <5.7 % 11.8 (H)   Blood sugars still running high, although improving. Received Lantus 10 units prior to discontinuation of insulin drip. Recommendations: Increase meal coverage insulin to Novolog 8 units tidwc if pt eats >50% meal. Would  not increase Lantus at this point. Continue to titrate up her meal coverage insulin until CBGs <180 mg/dL. May need assistance from case management for PCP to manage DM.  Will continue to follow while inpatient. Thank you. Ailene Ards, RD, LDN, CDE Inpatient Diabetes Coordinator [phone]

## 2013-07-13 NOTE — Progress Notes (Signed)
Pt. Was discharged home she was given her discharge instructions and all questions were answered. She was transported home by a friend.

## 2013-07-13 NOTE — Care Management Note (Signed)
Cm spoke with patient at the bedside concerning discharge planning. Pt states wants to change PCP. Pt provided CM permission to schedule an appt at [facility] on 9/24 at noon to establish a new PCP. Pt is independent. Pt provides own transportation. Pt uses [facility] on [address].   Roxy Manns Cortavius Montesinos,RN,MSN [phone]

## 2013-07-13 NOTE — Progress Notes (Signed)
INITIAL NUTRITION ASSESSMENT  DOCUMENTATION CODES Per approved criteria  -Obesity Unspecified   INTERVENTION: - Stressed importance of following diabetic diet - serving sizes discussed, recommended meals/snacks discussed, handouts provided. Pt familiar with information. Expect good compliance. - Will continue to monitor   NUTRITION DIAGNOSIS: Altered nutrition related lab values related to uncontrolled diabetes as evidenced by elevated blood sugars and HbA1c.   Goal: 1. CBGs < 200 mg/dL 2. Pt to consume >90% of meals  Monitor:  Weights, labs, intake, CBGs  Reason for Assessment: Nutrition risk   57 y.o. female  Admitting Dx: Elevated blood sugars  ASSESSMENT: Pt with history of insulin-dependent diabetes, liver transplant, and hepatitis C. Pt with elevated blood sugars x 2 weeks associated with poor appetite and blurred vision. Pt also c/o changes in taste. Pt has tried to force herself to eat 3 meals/day. Pt states she typically eats oatmeal, fruit, and coffee for breakfast, a handful of cashew nuts for lunch, and some greens for dinner. Pt reports drinking excess amounts of water but still drinking a lot. Potassium slightly low with elevated Alk phos. Unsure of usual weight but thinks it's around 200 pounds, admission weight was 210 pounds.   Lab Results  Component Value Date   HGBA1C 11.8* 07/11/2013    Height: Ht Readings from Last 1 Encounters:  07/12/13 5\' 7"  (1.702 m)    Weight: Wt Readings from Last 1 Encounters:  07/12/13 210 lb 8.6 oz (95.5 kg)    Ideal Body Weight: 135 lb   % Ideal Body Weight: 156%  Wt Readings from Last 10 Encounters:  07/12/13 210 lb 8.6 oz (95.5 kg)    Usual Body Weight: 200 lb per pt  % Usual Body Weight: 105%  BMI:  Body mass index is 32.97 kg/(m^2). Class I obesity  Estimated Nutritional Needs: Kcal: 1550-1750 Protein: 60-75g Fluid: 1.5-1.7L/day  Skin: Intact  Diet Order: Carb Control  EDUCATION NEEDS: -Education  needs addressed - discussed diabetic diet and provided handouts of this information    Intake/Output Summary (Last 24 hours) at 07/13/13 1221 Last data filed at 07/13/13 0900  Gross per 24 hour  Intake    720 ml  Output   1000 ml  Net   -280 ml    Last BM: 9/13  Labs:   Recent Labs Lab 07/12/13 0127 07/12/13 0520 07/13/13 0345  NA 140 138 136  K 3.4* 3.7 3.4*  CL 109 105 105  CO2 21 23 21   BUN 14 13 9   CREATININE 0.58 0.63 0.60  CALCIUM 8.6 8.9 8.6  GLUCOSE 141* 177* 235*    CBG (last 3)   Recent Labs  07/12/13 2140 07/13/13 0722 07/13/13 1208  GLUCAP 270* 233* 254*    Scheduled Meds: . amLODipine  10 mg Oral Daily  . aspirin  81 mg Oral Daily  . azaTHIOprine  50 mg Oral Daily  . enoxaparin (LOVENOX) injection  40 mg Subcutaneous QHS  . insulin aspart  0-15 Units Subcutaneous TID WC  . insulin aspart  0-5 Units Subcutaneous QHS  . insulin aspart  4 Units Subcutaneous TID WC  . insulin glargine  60 Units Subcutaneous QHS  . multivitamin with minerals  1 tablet Oral Daily  . pantoprazole  40 mg Oral Daily  . predniSONE  5 mg Oral Q breakfast  . propranolol  20 mg Oral BID  . tacrolimus  3 mg Oral BID    Continuous Infusions: . sodium chloride 75 mL/hr at 07/12/13 1406  Past Medical History  Diagnosis Date  . Diabetes mellitus without complication     Past Surgical History  Procedure Laterality Date  . Liver transplantation      Levon Hedger MS, RD, LDN [phone] Pager [phone] After Hours Pager

## 2013-07-13 NOTE — ED Provider Notes (Signed)
Medical screening examination/treatment/procedure(s) were conducted as a shared visit with non-physician practitioner(s) and myself.  I personally evaluated the patient during the encounter Pt w hx liver transplant, c/o persistent high sugars, polyuria. No change in meds or doses. No change in diet. No fever or chills. No nvd. abd soft nt. Chest cta. Labs. Iv ns. Iv insulin. Admit.   Suzi Roots, MD 07/13/13 [phone]

## 2013-07-22 ENCOUNTER — Ambulatory Visit: Payer: Medicare Other | Attending: Internal Medicine | Admitting: Internal Medicine

## 2013-07-22 VITALS — BP 133/85 | HR 69 | Temp 97.9°F | Resp 16 | Wt 213.4 lb

## 2013-07-22 DIAGNOSIS — I10 Essential (primary) hypertension: Secondary | ICD-10-CM | POA: Insufficient documentation

## 2013-07-22 DIAGNOSIS — Z944 Liver transplant status: Secondary | ICD-10-CM

## 2013-07-22 DIAGNOSIS — E1165 Type 2 diabetes mellitus with hyperglycemia: Secondary | ICD-10-CM

## 2013-07-22 DIAGNOSIS — IMO0001 Reserved for inherently not codable concepts without codable children: Secondary | ICD-10-CM | POA: Insufficient documentation

## 2013-07-22 NOTE — Discharge Summary (Signed)
Physician Discharge Summary  Kathryn Walsh GEX:[id number] DOB: Aug 06, 1956 DOA: 07/11/2013  PCP: Jeanann Lewandowsky, MD  Admit date: 07/11/2013 Discharge date: 07/13/2013  Time spent: 45 minutes  Recommendations for Outpatient Follow-up:  1. PCP at [facility] on 9/24  Discharge Diagnoses:  Active Problems:   S/P liver transplant   Type II diabetes mellitus, uncontrolled   Dehydration  Discharge Condition: improved  Diet recommendation: carb modified  Filed Weights   07/11/13 2005 07/12/13 0400  Weight: 94.2 kg (207 lb 10.8 oz) 95.5 kg (210 lb 8.6 oz)    History of present illness:  Kathryn Walsh is a 57 y.o. female has a past medical history significant for insulin-dependent diabetes, liver transplant in 1999, hepatitis C, presents today with a chief complaint of high blood sugars that she hasn't been able to control over the past 2 weeks. She usually takes Lantus sliding scale, and despite her reported compliance with all these medications her sugars have been running in the 3 to 400s over the past couple weeks. She denies any fever or chills. She endorses mild nausea but denies vomiting. She has no abdominal pain. She denies shortness of breath. She had an episode like this a few years back that required hospitalization. She endorses increased thirst. She has no chest pain. She has no lightheadedness or dizziness. She denies any dysuria. She reports no medication changes. She stores her insulin in the refrigerator, and her recent insulin vial is new. No dietary changes.    Hospital Course:  1. Uncontrolled Hyperglycemia -trigger unclear, no obvious source of infection  -transitioned off glucomander  -Resumed Lantus, dose increased dose to 60Units QHS, takes 50units at home  -and Novolog meal coverage added at discharge, will need further titration as outpatient -DM coordinator consult appreciated  2. S/p Liver transplant  -continue Prograf takes 3mg  BID   -Followed by Isidor Holts at [facility], next FU later this month   3. Dehydration  -improved  Consultations:  Dm Coordinator  Discharge Exam: Filed Vitals:   07/13/13 0951  BP: 115/70  Pulse: 67  Temp: 99.7 F (37.6 C)  Resp: 16    General: AAOx3 Cardiovascular: S1S2/RRR Respiratory: CTAB  Discharge Instructions  Discharge Orders   Future Appointments Provider Department Dept Phone   08/21/2013 9:45 AM [facility] Covering Provider [facility] [phone]   Future Orders Complete By Expires   Diet - low sodium heart healthy  As directed    Diet Carb Modified  As directed    Increase activity slowly  As directed        Medication List    STOP taking these medications           TAKE these medications     insulin glargine 100 UNIT/ML injection  Commonly known as:  LANTUS Take 60 Units QHS     amLODipine 10 MG tablet  Commonly known as:  NORVASC  Take 10 mg by mouth daily.     aspirin 81 MG chewable tablet  Chew 81 mg by mouth daily.     azaTHIOprine 50 MG tablet  Commonly known as:  IMURAN  Take 50 mg by mouth daily.     insulin aspart 100 UNIT/ML injection  Commonly known as:  novoLOG  Inject 8 Units into the skin 3 (three) times daily with meals.     multivitamin with minerals Tabs tablet  Take 1 tablet by mouth daily.     omeprazole 20 MG capsule  Commonly known as:  PRILOSEC  Take 20 mg by mouth daily.     potassium chloride SA 20 MEQ tablet  Commonly known as:  K-DUR,KLOR-CON  Take 20 mEq by mouth daily.     predniSONE 5 MG tablet  Commonly known as:  DELTASONE  Take 5 mg by mouth daily.     propranolol 20 MG tablet  Commonly known as:  INDERAL  Take 20 mg by mouth 2 (two) times daily.     tacrolimus 1 MG capsule  Commonly known as:  PROGRAF  Take 3 mg by mouth 2 (two) times daily.       No Known Allergies     Follow-up Information   Follow up with [facility] On  07/22/2013. (Establish PCP appt at noon)    Contact information:   [address] [place] [address] [phone]       The results of significant diagnostics from this hospitalization (including imaging, microbiology, ancillary and laboratory) are listed below for reference.    Significant Diagnostic Studies: Dg Chest 2 View  07/11/2013   *RADIOLOGY REPORT*  Clinical Data: Hyperglycemic, hypertension.  CHEST - 2 VIEW  Comparison: May 07, 2010.  Findings: Cardiomediastinal silhouette appears normal.  No acute pulmonary disease is noted.  Bony thorax is intact.  IMPRESSION: No acute cardiopulmonary abnormality seen.   Original Report Authenticated By: Lupita Raider.,  M.D.    Microbiology: No results found for this or any previous visit (from the past 240 hour(s)).   Labs: Basic Metabolic Panel: No results found for this basename: NA, K, CL, CO2, GLUCOSE, BUN, CREATININE, CALCIUM, MG, PHOS,  in the last 168 hours Liver Function Tests: No results found for this basename: AST, ALT, ALKPHOS, BILITOT, PROT, ALBUMIN,  in the last 168 hours No results found for this basename: LIPASE, AMYLASE,  in the last 168 hours No results found for this basename: AMMONIA,  in the last 168 hours CBC: No results found for this basename: WBC, NEUTROABS, HGB, HCT, MCV, PLT,  in the last 168 hours Cardiac Enzymes: No results found for this basename: CKTOTAL, CKMB, CKMBINDEX, TROPONINI,  in the last 168 hours BNP: BNP (last 3 results) No results found for this basename: PROBNP,  in the last 8760 hours CBG: No results found for this basename: GLUCAP,  in the last 168 hours     Signed:  Ryiah Bellissimo  [facility] 07/22/2013, 8:12 PM

## 2013-07-22 NOTE — Patient Instructions (Signed)
Diabetes, Frequently Asked Questions  WHAT IS DIABETES?  Most of the food we eat is turned into glucose (sugar). Our bodies use it for energy. The pancreas makes a hormone called insulin. It helps glucose get into the cells of our bodies. When you have diabetes, your body either does not make enough insulin or cannot use its own insulin as well as it should. This causes sugars to build up in your blood.  WHAT ARE THE SYMPTOMS OF DIABETES?   Frequent urination.   Excessive thirst.   Unexplained weight loss.   Extreme hunger.   Blurred vision.   Tingling or numbness in hands or feet.   Feeling very tired much of the time.   Dry, itchy skin.   Sores that are slow to heal.   Yeast infections.  WHAT ARE THE TYPES OF DIABETES?  Type 1 Diabetes    About 10% of affected people have this type.   Usually occurs before the age of 30.   Usually occurs in thin to normal weight people.  Type 2 Diabetes   About 90% of affected people have this type.   Usually occurs after the age of 40.   Usually occurs in overweight people.   More likely to have:   A family history of diabetes.   A history of diabetes during pregnancy (gestational diabetes).   High blood pressure.   High cholesterol and triglycerides.  Gestational Diabetes   Occurs in about 4% of pregnancies.   Usually goes away after the baby is born.   More likely to occur in women with:   Family history of diabetes.   Previous gestational diabetes.   Obese.   Over 25 years old.  WHAT IS PRE-DIABETES?  Pre-diabetes means your blood glucose is higher than normal, but lower than the diabetes range. It also means you are at risk of getting type 2 diabetes and heart disease. If you are told you have pre-diabetes, have your blood glucose checked again in 1 to 2 years.  WHAT IS THE TREATMENT FOR DIABETES?   Treatment is aimed at keeping blood glucose near normal levels at all times. Learning how to manage this yourself is important in treating diabetes. Depending on the type of diabetes you have, your treatment will include one or more of the following:   Monitoring your blood glucose.   Meal planning.   Exercise.   Oral medicine (pills) or insulin.  CAN DIABETES BE PREVENTED?  With type 1 diabetes, prevention is more difficult, because the triggers that cause it are not yet known.  With type 2 diabetes, prevention is more likely, with lifestyle changes:   Maintain a healthy weight.   Eat healthy.   Exercise.  IS THERE A CURE FOR DIABETES?  No, there is no cure for diabetes. There is a lot of research going on that is looking for a cure, and progress is being made. Diabetes can be treated and controlled. People with diabetes can manage their diabetes and lead normal, active lives.  SHOULD I BE TESTED FOR DIABETES?  If you are at least 57 years old, you should be tested for diabetes. You should be tested again every 3 years. If you are 45 or older and overweight, you may want to get tested more often. If you are younger than 45, overweight, and have one or more of the following risk factors, you should be tested:   Family history of diabetes.   Inactive lifestyle.   High blood pressure.    WHAT ARE SOME OTHER SOURCES FOR INFORMATION ON DIABETES?  The following organizations may help in your search for more information on diabetes:  National Diabetes Education Program (NDEP)  Internet: http://www.ndep.nih.gov/resources  American Diabetes Association  Internet: http://www.diabetes.org   Juvenile Diabetes Foundation International  Internet: http://www.jdf.org  Document Released: 10/18/2003 Document Revised: 01/07/2012 Document Reviewed: 08/12/2009  [employer] Patient Information 2014 [employer].

## 2013-07-22 NOTE — Progress Notes (Signed)
Patient here for follow up from hospital Her blood sugar was not able to be read on the glucometer Blood sugar this am was 166

## 2013-07-22 NOTE — Progress Notes (Signed)
Patient ID: Kathryn Walsh, female   DOB: [birth year], 57 y.o.   MRN: [id number]  CC: New patient  HPI: 57 year old female with past medical history hepatitis C status post liver transplant on immune- modulator therapy, diabetes, hypertension who presented to clinic to establish primary care provider. Patient reports having been in hospital for uncontrolled diabetes and recommendation was made for a combination of Lantus and NovoLog. Patient reports that this is too difficult for her to follow and is resulting in noncompliance. She would prefer to be on a once a day regimen of Lantus. No lightheadedness or loss of consciousness. Patient does report having occasional blurry vision and is afraid she may have cataracts. No chest pain or shortness of breath.  No Known Allergies Past Medical History  Diagnosis Date  . Diabetes mellitus without complication    Current Outpatient Prescriptions on File Prior to Visit  Medication Sig Dispense Refill  . amLODipine (NORVASC) 10 MG tablet Take 10 mg by mouth daily.      [name] aspirin 81 MG chewable tablet Chew 81 mg by mouth daily.      [name] azaTHIOprine (IMURAN) 50 MG tablet Take 50 mg by mouth daily.      . Multiple Vitamin (MULTIVITAMIN WITH MINERALS) TABS tablet Take 1 tablet by mouth daily.      [name] omeprazole (PRILOSEC) 20 MG capsule Take 20 mg by mouth daily.      . potassium chloride SA (K-DUR,KLOR-CON) 20 MEQ tablet Take 20 mEq by mouth daily.      . predniSONE (DELTASONE) 5 MG tablet Take 5 mg by mouth daily.      . propranolol (INDERAL) 20 MG tablet Take 20 mg by mouth 2 (two) times daily.      . tacrolimus (PROGRAF) 1 MG capsule Take 3 mg by mouth 2 (two) times daily.      . insulin aspart (NOVOLOG) 100 UNIT/ML injection Inject 8 Units into the skin 3 (three) times daily with meals.  1 vial  12   No current facility-administered medications on file prior to visit.   Family medical history significant for HTN, HLD  History   Social History  .  Marital Status: Single    Spouse Name: N/A    Number of Children: N/A  . Years of Education: N/A   Occupational History  . Not on file.   Social History Main Topics  . Smoking status: Current Some Day Smoker -- 0.50 packs/day for 12 years    Types: Cigarettes  . Smokeless tobacco: Not on file  . Alcohol Use: Not on file  . Drug Use: Not on file  . Sexual Activity: Not on file   Other Topics Concern  . Not on file   Social History Narrative  . No narrative on file    Review of Systems  Constitutional: Negative for fever, chills, diaphoresis, activity change, appetite change and fatigue.  HENT: Negative for ear pain, nosebleeds, congestion, facial swelling, rhinorrhea, neck pain, neck stiffness and ear discharge.   Eyes: Negative for pain, discharge, redness, itching and visual disturbance.  Respiratory: Negative for cough, choking, chest tightness, shortness of breath, wheezing and stridor.   Cardiovascular: Negative for chest pain, palpitations and leg swelling.  Gastrointestinal: Negative for abdominal distention.  Genitourinary: Negative for dysuria, urgency, frequency, hematuria, flank pain, decreased urine volume, difficulty urinating and dyspareunia.  Musculoskeletal: Negative for back pain, joint swelling, arthralgias and gait problem.  Neurological: Negative for dizziness, tremors, seizures, syncope, facial asymmetry, speech difficulty, weakness,  light-headedness, numbness and headaches.  Hematological: Negative for adenopathy. Does not bruise/bleed easily.  Psychiatric/Behavioral: Negative for hallucinations, behavioral problems, confusion, dysphoric mood, decreased concentration and agitation.    Objective:   Filed Vitals:   07/22/13 1201  BP: 133/85  Pulse: 69  Temp: 97.9 F (36.6 C)  Resp: 16    Physical Exam  Constitutional: Appears well-developed and well-nourished. No distress.  HENT: Normocephalic. External right and left ear normal. Oropharynx is  clear and moist.  Eyes: Conjunctivae and EOM are normal. PERRLA, no scleral icterus.  Neck: Normal ROM. Neck supple. No JVD. No tracheal deviation. No thyromegaly.  CVS: RRR, S1/S2 +, no murmurs, no gallops, no carotid bruit.  Pulmonary: Effort and breath sounds normal, no stridor, rhonchi, wheezes, rales.  Abdominal: Soft. BS +,  no distension, tenderness, rebound or guarding.  Musculoskeletal: Normal range of motion. No edema and no tenderness.  Lymphadenopathy: No lymphadenopathy noted, cervical, inguinal. Neuro: Alert. Normal reflexes, muscle tone coordination. No cranial nerve deficit. Skin: Skin is warm and dry. No rash noted. Not diaphoretic. No erythema. No pallor.  Psychiatric: Normal mood and affect. Behavior, judgment, thought content normal.   Lab Results  Component Value Date   WBC 3.1* 07/13/2013   HGB 12.7 07/13/2013   HCT 36.5 07/13/2013   MCV 85.9 07/13/2013   PLT 79* 07/13/2013   Lab Results  Component Value Date   CREATININE 0.60 07/13/2013   BUN 9 07/13/2013   NA 136 07/13/2013   K 3.4* 07/13/2013   CL 105 07/13/2013   CO2 21 07/13/2013    Lab Results  Component Value Date   HGBA1C 11.8* 07/11/2013   Lipid Panel     Component Value Date/Time   CHOL  Value: 180        ATP III CLASSIFICATION:  <200     mg/dL   Desirable  161-096  mg/dL   Borderline High  >=045    mg/dL   High        02/04/8118 0609   TRIG 389* 05/07/2010 0609   HDL 37* 05/07/2010 0609   CHOLHDL 4.9 05/07/2010 0609   VLDL 78* 05/07/2010 0609   LDLCALC  Value: 65        Total Cholesterol/HDL:CHD Risk Coronary Heart Disease Risk Table                     Men   Women  1/2 Average Risk   3.4   3.3  Average Risk       5.0   4.4  2 X Average Risk   9.6   7.1  3 X Average Risk  23.4   11.0        Use the calculated Patient Ratio above and the CHD Risk Table to determine the patient's CHD Risk.        ATP III CLASSIFICATION (LDL):  <100     mg/dL   Optimal  147-829  mg/dL   Near or Above                    Optimal   130-159  mg/dL   Borderline  562-130  mg/dL   High  >865     mg/dL   Very High 7/84/6962 9528       Assessment and plan:   Patient Active Problem List   Diagnosis Date Noted  . HTN (hypertension) 07/22/2013    Priority: Medium - Blood pressure 133/85  - We have discussed target  BP range - I have advised pt to check BP regularly and to call us back if the numbers are higher than 140/90 - discussed the importance of compliance with medical therapy and diet  - Continue Norvasc and propranolol   . S/P liver transplant 07/11/2013    Priority: Medium - On azathioprine, prednisone and Prograf   . HCV (hepatitis C virus) 07/11/2013    Priority: Medium - Status post liver transplant   . Type II diabetes mellitus, uncontrolled 07/11/2013    Priority: Medium - CBGs at home in 250 range. Patient does not take NovoLog 8 units 3 times a day. She says this is very inconvenient for her. She's working throughout the day and she prefers to have simpler regimen to once a day. Increase Lantus to 70 units at bedtime and keep a log of sugars. Patient will return in one month and will see how CBGs look and will make further changes from this point. - Referral to ophthalmologist provided for evaluation of cataract

## 2013-07-30 ENCOUNTER — Encounter (HOSPITAL_COMMUNITY): Payer: Self-pay | Admitting: Family

## 2013-07-30 ENCOUNTER — Ambulatory Visit (HOSPITAL_BASED_OUTPATIENT_CLINIC_OR_DEPARTMENT_OTHER): Payer: Medicare Other | Admitting: Family Medicine

## 2013-07-30 ENCOUNTER — Inpatient Hospital Stay (HOSPITAL_COMMUNITY)
Admission: AD | Admit: 2013-07-30 | Discharge: 2013-08-01 | DRG: 603 | Disposition: A | Discharge status: Home Health | Payer: Medicare Other | Source: Ambulatory Visit | Attending: Family Medicine | Admitting: Family Medicine

## 2013-07-30 ENCOUNTER — Encounter: Payer: Self-pay | Admitting: Family Medicine

## 2013-07-30 VITALS — BP 149/95 | HR 88 | Temp 98.2°F | Resp 18 | Ht 71.0 in | Wt 209.0 lb

## 2013-07-30 DIAGNOSIS — IMO0002 Reserved for concepts with insufficient information to code with codable children: Secondary | ICD-10-CM | POA: Diagnosis present

## 2013-07-30 DIAGNOSIS — F17200 Nicotine dependence, unspecified, uncomplicated: Secondary | ICD-10-CM | POA: Diagnosis present

## 2013-07-30 DIAGNOSIS — Z944 Liver transplant status: Secondary | ICD-10-CM

## 2013-07-30 DIAGNOSIS — L0291 Cutaneous abscess, unspecified: Secondary | ICD-10-CM

## 2013-07-30 DIAGNOSIS — IMO0001 Reserved for inherently not codable concepts without codable children: Secondary | ICD-10-CM

## 2013-07-30 DIAGNOSIS — B182 Chronic viral hepatitis C: Secondary | ICD-10-CM | POA: Diagnosis present

## 2013-07-30 DIAGNOSIS — R739 Hyperglycemia, unspecified: Secondary | ICD-10-CM

## 2013-07-30 DIAGNOSIS — E876 Hypokalemia: Secondary | ICD-10-CM | POA: Diagnosis present

## 2013-07-30 DIAGNOSIS — L02219 Cutaneous abscess of trunk, unspecified: Secondary | ICD-10-CM

## 2013-07-30 DIAGNOSIS — K219 Gastro-esophageal reflux disease without esophagitis: Secondary | ICD-10-CM | POA: Diagnosis present

## 2013-07-30 DIAGNOSIS — B3731 Acute candidiasis of vulva and vagina: Secondary | ICD-10-CM | POA: Diagnosis present

## 2013-07-30 DIAGNOSIS — N764 Abscess of vulva: Secondary | ICD-10-CM

## 2013-07-30 DIAGNOSIS — E1165 Type 2 diabetes mellitus with hyperglycemia: Secondary | ICD-10-CM | POA: Diagnosis present

## 2013-07-30 DIAGNOSIS — I10 Essential (primary) hypertension: Secondary | ICD-10-CM

## 2013-07-30 DIAGNOSIS — R7309 Other abnormal glucose: Secondary | ICD-10-CM

## 2013-07-30 DIAGNOSIS — B373 Candidiasis of vulva and vagina: Secondary | ICD-10-CM | POA: Diagnosis present

## 2013-07-30 DIAGNOSIS — L02215 Cutaneous abscess of perineum: Secondary | ICD-10-CM

## 2013-07-30 DIAGNOSIS — D899 Disorder involving the immune mechanism, unspecified: Secondary | ICD-10-CM

## 2013-07-30 DIAGNOSIS — D849 Immunodeficiency, unspecified: Secondary | ICD-10-CM | POA: Diagnosis present

## 2013-07-30 DIAGNOSIS — B958 Unspecified staphylococcus as the cause of diseases classified elsewhere: Secondary | ICD-10-CM | POA: Diagnosis present

## 2013-07-30 MED ORDER — ONDANSETRON HCL 4 MG/2ML IJ SOLN: 4.0000 mg | Freq: Four times a day (QID) | INTRAMUSCULAR | Status: DC | PRN

## 2013-07-30 MED ORDER — HYDROMORPHONE HCL PF 1 MG/ML IJ SOLN
0.5000 mg | Freq: Once | INTRAMUSCULAR | Status: AC
  Administered 2013-07-30: 0.5 mg via INTRAVENOUS
  Filled 2013-07-30: qty 1

## 2013-07-30 MED ORDER — INSULIN ASPART 100 UNIT/ML ~~LOC~~ SOLN
8.0000 [IU] | Freq: Three times a day (TID) | SUBCUTANEOUS | Status: DC
  Administered 2013-07-31 – 2013-08-01 (×3): 8 [IU] via SUBCUTANEOUS

## 2013-07-30 MED ORDER — INSULIN ASPART 100 UNIT/ML ~~LOC~~ SOLN: 0.0000 [IU] | Freq: Every day | SUBCUTANEOUS | Status: DC

## 2013-07-30 MED ORDER — TACROLIMUS 1 MG PO CAPS
3.0000 mg | ORAL_CAPSULE | Freq: Two times a day (BID) | ORAL | Status: DC
  Administered 2013-07-31: 3 mg via ORAL
  Filled 2013-07-30 (×3): qty 3

## 2013-07-30 MED ORDER — ONDANSETRON HCL 4 MG PO TABS: 4.0000 mg | ORAL_TABLET | Freq: Four times a day (QID) | ORAL | Status: DC | PRN

## 2013-07-30 MED ORDER — POTASSIUM CHLORIDE CRYS ER 20 MEQ PO TBCR
20.0000 meq | EXTENDED_RELEASE_TABLET | Freq: Every day | ORAL | Status: DC
  Administered 2013-07-31 – 2013-08-01 (×2): 20 meq via ORAL
  Filled 2013-07-30 (×2): qty 1

## 2013-07-30 MED ORDER — ACETAMINOPHEN 325 MG PO TABS: 650.0000 mg | ORAL_TABLET | Freq: Four times a day (QID) | ORAL | Status: DC | PRN

## 2013-07-30 MED ORDER — PANTOPRAZOLE SODIUM 40 MG PO TBEC
40.0000 mg | DELAYED_RELEASE_TABLET | Freq: Every day | ORAL | Status: DC
  Administered 2013-07-31 – 2013-08-01 (×2): 40 mg via ORAL
  Filled 2013-07-30 (×2): qty 1

## 2013-07-30 MED ORDER — AMLODIPINE BESYLATE 10 MG PO TABS
10.0000 mg | ORAL_TABLET | Freq: Every day | ORAL | Status: DC
  Administered 2013-07-31 – 2013-08-01 (×2): 10 mg via ORAL
  Filled 2013-07-30 (×2): qty 1

## 2013-07-30 MED ORDER — INSULIN ASPART 100 UNIT/ML ~~LOC~~ SOLN
0.0000 [IU] | Freq: Three times a day (TID) | SUBCUTANEOUS | Status: DC
  Administered 2013-07-31: 3 [IU] via SUBCUTANEOUS
  Administered 2013-07-31: 2 [IU] via SUBCUTANEOUS
  Administered 2013-08-01: 3 [IU] via SUBCUTANEOUS

## 2013-07-30 MED ORDER — AZATHIOPRINE 50 MG PO TABS: 50.0000 mg | ORAL_TABLET | Freq: Every day | ORAL | Status: DC

## 2013-07-30 MED ORDER — MORPHINE SULFATE 2 MG/ML IJ SOLN
2.0000 mg | INTRAMUSCULAR | Status: DC | PRN
  Administered 2013-07-31 – 2013-08-01 (×6): 2 mg via INTRAVENOUS
  Filled 2013-07-30 (×6): qty 1

## 2013-07-30 MED ORDER — PREDNISONE 5 MG PO TABS
5.0000 mg | ORAL_TABLET | Freq: Every day | ORAL | Status: DC
  Administered 2013-07-31 – 2013-08-01 (×2): 5 mg via ORAL
  Filled 2013-07-30 (×2): qty 1

## 2013-07-30 MED ORDER — INSULIN GLARGINE 100 UNIT/ML ~~LOC~~ SOLN
70.0000 [IU] | Freq: Every day | SUBCUTANEOUS | Status: DC
  Administered 2013-07-31 (×2): 70 [IU] via SUBCUTANEOUS
  Filled 2013-07-30 (×3): qty 0.7

## 2013-07-30 MED ORDER — SODIUM CHLORIDE 0.9 % IV SOLN
INTRAVENOUS | Status: DC
  Administered 2013-07-30 – 2013-07-31 (×2): via INTRAVENOUS

## 2013-07-30 MED ORDER — PROPRANOLOL HCL 20 MG PO TABS
20.0000 mg | ORAL_TABLET | Freq: Two times a day (BID) | ORAL | Status: DC
  Administered 2013-07-31 – 2013-08-01 (×4): 20 mg via ORAL
  Filled 2013-07-30 (×5): qty 1

## 2013-07-30 MED ORDER — HEPARIN SODIUM (PORCINE) 5000 UNIT/ML IJ SOLN
5000.0000 [IU] | Freq: Three times a day (TID) | INTRAMUSCULAR | Status: DC
  Administered 2013-07-31 – 2013-08-01 (×5): 5000 [IU] via SUBCUTANEOUS
  Filled 2013-07-30 (×8): qty 1

## 2013-07-30 MED ORDER — ACETAMINOPHEN 650 MG RE SUPP: 650.0000 mg | Freq: Four times a day (QID) | RECTAL | Status: DC | PRN

## 2013-07-30 MED ORDER — ASPIRIN 81 MG PO CHEW
81.0000 mg | CHEWABLE_TABLET | Freq: Every day | ORAL | Status: DC
  Administered 2013-07-31 – 2013-08-01 (×2): 81 mg via ORAL
  Filled 2013-07-30 (×2): qty 1

## 2013-07-30 NOTE — Progress Notes (Signed)
Pt here with large left vaginal boil with increased purulent drainage x 1 week cbg 337

## 2013-07-30 NOTE — Patient Instructions (Addendum)
Go directly to the ER for further evaluation and treatment  Perineal Abscess An abscess is a sac filled infection. The perineum is the area between the anus and the scrotum in the female and between the anus and the vulva (the labial opening to the vagina) in the female. The size of the abscess varies. If it remains open and begins draining, it can form a fistula. A fistula is a tract that drains the abscess to the surface. The peak incidence of this problem is in the third to fourth decades of life. Men are affected more frequently than women.  CAUSES  There are numerous causes for this type of infection. In women  The spread of cancer from pelvic organs such as the uterus and ovaries or the spread of infection from special glands located near the vagina.  Problems that occur at the time or soon after delivering a baby:  Infected lochia (the fluid that weeps from the vagina for a week or so after delivery of a baby).  Stool contaminating an episiotomy (a surgical procedure for widening the outlet of the birth canal to facilitate delivery of the baby and to avoid a jagged rip of the perineum).  Poor hygiene could all contribute to forming an abscess of the perineum. In men  Infection of the prostate gland or the spread of cancer from the prostate. In men and women  Complication of surgery to the rectum.  Diseases of the colon (such as Crohn's disease).  The spread of cancer from the rectum.  It is also more common when the immune system is depressed:  Patients receiving chemotherapy.  Patients with AIDS. SYMPTOMS  Pain, swelling, and redness in the area of the abscess are the most frequent symptoms. The redness may go beyond the abscess and appear as a red streak on the skin. There is often a visible lump or a lump that can be felt when touching the area. Bleeding, pus-like discharge, fevers, and general weakness may also be present.  DIAGNOSIS  An examination by your caregiver will  establish this ailment. A digital rectal exam, and in women, a careful vaginal exam, is sometimes necessary. Sometimes looking into your rectum with a special instrument is needed.  TREATMENT  Incision and drainage is one common treatment of an abscess. This can sometimes be done in an office, emergency department, or surgical center with a local anesthetic. For larger or deeper abscesses, an operation is required to drain the abscess. Antibiotics are sometimes used if there is cellulitis (infection of the surrounding tissue). Antibiotics are medications that kill bacteria germs. Antibiotics may also be used if there are immune problems or heart valve problems.  Packing of the wound is sometimes done to produce good drainage. Frequent sitz baths may be recommended to help the wound heal in from the base and sides so that the risk of continued infection and recurrence is reduced. HOME CARE INSTRUCTIONS   If a gauze pack is used in the abscess, it can usually be removed in 2 to 3 days or as directed.  If one or more drains have been placed in the abscess cavity, be careful not to pull at them. Your caregiver will tell you how long they need to remain in place.  Use warm sitz baths 3 to 4 times per day and after bowel movements.  Keep the skin around the abscess clean and dry. Avoid cleaning the area too much or scratching.  Avoid using colored or perfumed toilet papers.  Use pain medication, antibiotics, or other medications that your caregiver tells you to use. SEEK MEDICAL CARE IF:   You have problems having a bowel movement or passing your urine.  The gauze packing or the drain(s) come out before the planned time.  Swelling or pain in the affected area does not seem to be improving as expected.  You feel you have side effects from prescribed medication. SEEK IMMEDIATE MEDICAL CARE IF:   You develop problems moving or using your legs.  You develop severe or increasing pain.  Swelling  of the affected area suddenly gets worse.  You have increased bleeding or passing of pus.  You develop chills or fever above 101 F (38.3 C). MAKE SURE YOU:   Understand these instructions.  Will watch your condition.  Will get help right away if you are not doing well or get worse. Document Released: 11/21/2006 Document Revised: 01/07/2012 Document Reviewed: 12/22/2007 [facility] Patient Information 2014 [place], [place].

## 2013-07-30 NOTE — Progress Notes (Signed)
Discussed case with Dr. Ladean Raya from [facility]. Earlier today, pt was seen for poorly controlled diabetes with draining abscess/cellulitis of vaginal region by Dr. Laural Benes. Pt was given recommendation to go to ED for consideration of admission for IV abx. Instead, pt went to Ob/Gyn where she, again, complained of draining vaginal lesions. Dr. Ladean Raya agrees with and recommends inpt admission for IV abx and better glucose control. States she will follow in consultation while inpatient.  Pt accepted to med-surg floor.

## 2013-07-30 NOTE — H&P (Signed)
Kathryn Walsh History and Physical  Kathryn Walsh ZOX:[id number] DOB: 1956/04/06 DOA: 07/30/2013  Referring physician: Dr. Ladean Walsh PCP: Kathryn Lewandowsky, MD  Specialists:   Chief Complaint: "It burns"  HPI: Kathryn Walsh is a 57 y.o. female  With a hx of poorly controlled type 2 DM, s/p liver transplant in the late 1990's, htn who was recently admitted for uncontrolled blood sugars one month ago. The patient presented to clinic today for follow up with complaints of pain around the vaginal region. Sx started around the time of discharge from her last hospitalization one month ago. Since then, pain and drainage have become worse. The patient was initially referred to the ED by Kathryn Walsh, however, the patient went to [facility] instead. The patient was then directly admitted for IV abx.  Review of Systems:  Per above, remainder of 10pt ros reviewed and are neg  Past Medical History  Diagnosis Date  . Diabetes mellitus without complication    Past Surgical History  Procedure Laterality Date  . Liver transplantation     Social History:  reports that she has been smoking Cigarettes.  She has a 6 pack-year smoking history. She does not have any smokeless tobacco history on file. Her alcohol and drug histories are not on file.  where does patient live--home, ALF, SNF? and with whom if at home?  Can patient participate in ADLs?  No Known Allergies  History reviewed. No pertinent family history.  (be sure to complete)  Prior to Admission medications   Medication Sig Start Date End Date Taking? Authorizing Provider  amLODipine (NORVASC) 10 MG tablet Take 10 mg by mouth daily.   Yes Historical Provider, MD  aspirin 81 MG chewable tablet Chew 81 mg by mouth daily.   Yes Historical Provider, MD  insulin aspart (NOVOLOG) 100 UNIT/ML injection Inject 8 Units into the skin 3 (three) times daily with meals. 07/13/13  Yes Kathryn Cove, MD  insulin glargine (LANTUS) 100  UNIT/ML injection Inject 70 Units into the skin at bedtime. Per sliding scale 07/13/13  Yes Kathryn Cove, MD  Multiple Vitamin (MULTIVITAMIN WITH MINERALS) TABS tablet Take 1 tablet by mouth daily.   Yes Historical Provider, MD  omeprazole (PRILOSEC) 20 MG capsule Take 20 mg by mouth daily.   Yes Historical Provider, MD  potassium chloride SA (K-DUR,KLOR-CON) 20 MEQ tablet Take 20 mEq by mouth daily.   Yes Historical Provider, MD  predniSONE (DELTASONE) 5 MG tablet Take 5 mg by mouth daily.   Yes Historical Provider, MD  propranolol (INDERAL) 20 MG tablet Take 20 mg by mouth 2 (two) times daily.   Yes Historical Provider, MD  tacrolimus (PROGRAF) 1 MG capsule Take 3 mg by mouth 2 (two) times daily.   Yes Historical Provider, MD   Physical Exam: Filed Vitals:   07/30/13 1809 07/30/13 2236 07/31/13 0006  BP: 125/80 95/68   Pulse: 91 80   Temp: 99.2 F (37.3 C) 98 F (36.7 C)   TempSrc: Oral    Resp: 20 18   Height:   5\' 11"  (1.803 m)  Weight:   94.802 kg (209 lb)  SpO2: 99%       General:  Awake, in nad  Eyes: PERRL B  ENT: membranes moist, dentition fair  Neck: trachea midline, neck supple  Cardiovascular: regular, s1, s2  Respiratory: normal resp effort, no wheezing  Abdomen: soft, nondistended  Skin: normal skin turgor  Musculoskeletal: perfused, no clubbing or cyanosis  Psychiatric: mood/affect normal // no auditory/visual hallucinations  Neurologic: cn2-12 grossly intact, strength/sensation intact  Labs on Admission:  Basic Metabolic Panel: No results found for this basename: NA, K, CL, CO2, GLUCOSE, BUN, CREATININE, CALCIUM, MG, PHOS,  in the last 168 hours Liver Function Tests: No results found for this basename: AST, ALT, ALKPHOS, BILITOT, PROT, ALBUMIN,  in the last 168 hours No results found for this basename: LIPASE, AMYLASE,  in the last 168 hours No results found for this basename: AMMONIA,  in the last 168 hours CBC: No results found for this  basename: WBC, NEUTROABS, HGB, HCT, MCV, PLT,  in the last 168 hours Cardiac Enzymes: No results found for this basename: CKTOTAL, CKMB, CKMBINDEX, TROPONINI,  in the last 168 hours  BNP (last 3 results) No results found for this basename: PROBNP,  in the last 8760 hours CBG: No results found for this basename: GLUCAP,  in the last 168 hours  Radiological Exams on Admission: No results found.  EKG: Independently reviewed.   Assessment/Plan Principal Problem:   Cellulitis and abscess Active Problems:   S/P liver transplant   Type II diabetes mellitus, uncontrolled   HTN (hypertension)   Immunosuppressed status   1. Vulvar abscess/cellulitis 1. Afebrile 2. Will obtain blood and wound cultures 3. Empirically start vanc to cover MRSA 2. Type 2 DM, uncontrolled 1. Last a1c of just under 12 one month ago 2. Pt claims to have bs in the 140-200 range 3. For now, will continue current regimen 4. Add SSI for additional coverage 3. Hx Liver transplant 1. Will follow LFT's closely 2. Continue immunosuppressives for now 4. HTN 1. BP stable 2. Cont current regimen 5. DVT prophylaxis 1. Heparin subQ  Code Status: Full (must indicate code status--if unknown or must be presumed, indicate so) Family Communication: Pt in room (indicate person spoken with, if applicable, with phone number if by telephone) Disposition Plan: Pending (indicate anticipated LOS)  Time spent:  Kathryn Walsh K Kathryn Walsh Pager [phone]  If 7PM-7AM, please contact night-coverage [email or web address] Password [email or web address] 07/31/2013, 12:11 AM

## 2013-07-30 NOTE — MAU Note (Signed)
57 yo, perimenopausal, presents to MAU with c/o vaginal cysts x 2; reports she has had the cysts for two weeks; began draining malodorous discharge x 2 days ago.

## 2013-07-30 NOTE — MAU Note (Signed)
Patient state she has a cyst on the left side of the vagina that started about 2 weeks ago. States she has one on the top and had one on the bottom that has burst. States she has had recurrent cysts that she has burst and has never had to have checked. Has a bad odor.

## 2013-07-30 NOTE — MAU Provider Note (Signed)
History     CSN: [id number]  Arrival date and time: 07/30/13 1743   First Provider Initiated Contact with Patient 07/30/13 2033      Chief Complaint  Patient presents with  . Abscess   HPI  57 yo sent from PCP office for evaluation of vulva abscess. Patient reports onset of discomfort approximately 2 weeks ago while hospitalized at [facility]. Patient states she felt it increase in size but the pain has now become unbearable particularly when sitting. Patient also states that an area started draining purulent discharge a few days ago. She denies fevers at home. Patient admits to having poorly controlled diabetes. Her CBG this morning in the office was 330.  Past Medical History  Diagnosis Date  . Diabetes mellitus without complication     Past Surgical History  Procedure Laterality Date  . Liver transplantation      History reviewed. No pertinent family history.  History  Substance Use Topics  . Smoking status: Current Some Day Smoker -- 0.50 packs/day for 12 years    Types: Cigarettes  . Smokeless tobacco: Not on file  . Alcohol Use: Not on file    Allergies: No Known Allergies  Prescriptions prior to admission  Medication Sig Dispense Refill  . amLODipine (NORVASC) 10 MG tablet Take 10 mg by mouth daily.      [name] aspirin 81 MG chewable tablet Chew 81 mg by mouth daily.      . insulin aspart (NOVOLOG) 100 UNIT/ML injection Inject 8 Units into the skin 3 (three) times daily with meals.  1 vial  12  . insulin glargine (LANTUS) 100 UNIT/ML injection Inject 70 Units into the skin at bedtime. Per sliding scale      . Multiple Vitamin (MULTIVITAMIN WITH MINERALS) TABS tablet Take 1 tablet by mouth daily.      [name] omeprazole (PRILOSEC) 20 MG capsule Take 20 mg by mouth daily.      . potassium chloride SA (K-DUR,KLOR-CON) 20 MEQ tablet Take 20 mEq by mouth daily.      . predniSONE (DELTASONE) 5 MG tablet Take 5 mg by mouth daily.      . propranolol (INDERAL) 20 MG tablet Take  20 mg by mouth 2 (two) times daily.      . tacrolimus (PROGRAF) 1 MG capsule Take 3 mg by mouth 2 (two) times daily.      . [DISCONTINUED] azaTHIOprine (IMURAN) 50 MG tablet Take 50 mg by mouth daily.        ROS Physical Exam   Blood pressure 125/80, pulse 91, temperature 99.2 F (37.3 C), temperature source Oral, resp. rate 20, SpO2 99.00%.  Physical Exam GENERAL: Well-developed, well-nourished female in no acute distress.  PELVIC: Visible abscess on mons pubis with area of excoriation. Erythematous and tender to touch, non fluctuant. Swollen left labia majora with a 5 mm opening draining purulent discharge. Evidence of yeast infection in groin area EXTREMITIES: No cyanosis, clubbing, or edema, 2+ distal pulses.   MAU Course  Procedures  Consult to hospitalist service  Assessment and Plan  57 yo with vulva abscess, poorly controlled diabetes, CHTN and s/p liver transplant - Hospitalist service (Dr. Rhona Leavens) accepted patient with GYN as consulting team given extensive co-morbidities - Would recommend to start IV Vancomycin (dosing per pharmacy) q 12 hours - warm compresses to vulva to help with abscess formation - topical nystatin to groin area TID - Will continue to follow.  - Contact [phone] for GYN attending on call with  any questions  Marcedes Tech 07/30/2013, 9:15 PM

## 2013-07-30 NOTE — Progress Notes (Signed)
Patient ID: Kathryn Walsh, female   DOB: 05/28/1956, 57 y.o.   MRN: [id number]  CC:  Vaginal abscess  HPI: The patient is a complicated patient status post having liver transplant with hyperglycemia and type 2 diabetes mellitus on immune suppressant drugs presents to the clinic today reporting that since she was discharged from the hospital she's had a worsening pelvic abscess in the vaginal area that has become intolerable.  The patient reports that she's having severe pain and drainage from the vaginal area involving this abscess.  She reports no fever or chills at this time.  She reports that she can barely sit down because the pain is so severe.  The patient reports that her blood sugars have been uncontrolled.  She's had blood sugars in the 400-700 range.  No Known Allergies Past Medical History  Diagnosis Date  . Diabetes mellitus without complication    Current Outpatient Prescriptions on File Prior to Visit  Medication Sig Dispense Refill  . amLODipine (NORVASC) 10 MG tablet Take 10 mg by mouth daily.      [name] aspirin 81 MG chewable tablet Chew 81 mg by mouth daily.      [name] azaTHIOprine (IMURAN) 50 MG tablet Take 50 mg by mouth daily.      . insulin aspart (NOVOLOG) 100 UNIT/ML injection Inject 8 Units into the skin 3 (three) times daily with meals.  1 vial  12  . insulin glargine (LANTUS) 100 UNIT/ML injection Inject 70 Units into the skin at bedtime. Per sliding scale      . Multiple Vitamin (MULTIVITAMIN WITH MINERALS) TABS tablet Take 1 tablet by mouth daily.      [name] omeprazole (PRILOSEC) 20 MG capsule Take 20 mg by mouth daily.      . potassium chloride SA (K-DUR,KLOR-CON) 20 MEQ tablet Take 20 mEq by mouth daily.      . predniSONE (DELTASONE) 5 MG tablet Take 5 mg by mouth daily.      . propranolol (INDERAL) 20 MG tablet Take 20 mg by mouth 2 (two) times daily.      . tacrolimus (PROGRAF) 1 MG capsule Take 3 mg by mouth 2 (two) times daily.       No current facility-administered  medications on file prior to visit.   No family history on file. History   Social History  . Marital Status: Single    Spouse Name: N/A    Number of Children: N/A  . Years of Education: N/A   Occupational History  . Not on file.   Social History Main Topics  . Smoking status: Current Some Day Smoker -- 0.50 packs/day for 12 years    Types: Cigarettes  . Smokeless tobacco: Not on file  . Alcohol Use: Not on file  . Drug Use: Not on file  . Sexual Activity: Not on file   Other Topics Concern  . Not on file   Social History Narrative  . No narrative on file    Review of Systems  Constitutional: Negative for fever, chills, diaphoresis, activity change, appetite change and fatigue.  HENT: Negative for ear pain, nosebleeds, congestion, facial swelling, rhinorrhea, neck pain, neck stiffness and ear discharge.   Eyes: Negative for pain, discharge, redness, itching and visual disturbance.  Respiratory: Negative for cough, choking, chest tightness, shortness of breath, wheezing and stridor.   Cardiovascular: Negative for chest pain, palpitations and leg swelling.  Gastrointestinal: Negative for abdominal distention.  Genitourinary: severe perineal abscess Musculoskeletal: Negative for back pain, joint  swelling, arthralgias and gait problem.  Neurological: Negative for dizziness, tremors, seizures, syncope, facial asymmetry, speech difficulty, weakness, light-headedness, numbness and headaches.  Hematological: Negative for adenopathy. Does not bruise/bleed easily.  Psychiatric/Behavioral: Negative for hallucinations, behavioral problems, confusion, dysphoric mood, decreased concentration and agitation.    Objective:   Filed Vitals:   07/30/13 1658  BP: 149/95  Pulse: 88  Temp: 98.2 F (36.8 C)  Resp: 18    Physical Exam  Constitutional: Appears well-developed and well-nourished. No distress.  HENT: Normocephalic. External right and left ear normal. Oropharynx is clear and  moist.  Eyes: Conjunctivae and EOM are normal. PERRLA, no scleral icterus.  Neck: Normal ROM. Neck supple. No JVD. No tracheal deviation. No thyromegaly.  CVS: RRR, S1/S2 +, no murmurs, no gallops, no carotid bruit.  Pulmonary: Effort and breath sounds normal, no stridor, rhonchi, wheezes, rales.  Abdominal: Soft. BS +,  no distension, tenderness, rebound or guarding.  Musculoskeletal: Normal range of motion. No edema and no tenderness.  GU: 2 large perineal abscesses noted with 1 draining yellow bloody fluid and one is hard and indurated involving nearly the entire mons pubis area.   Lymphadenopathy: No lymphadenopathy noted, cervical, inguinal. Neuro: Alert. Normal reflexes, muscle tone coordination. No cranial nerve deficit. Skin: Skin is warm and dry. No rash noted. Not diaphoretic. No erythema. No pallor.  Psychiatric: Normal mood and affect. Behavior, judgment, thought content normal.   Lab Results  Component Value Date   WBC 3.1* 07/13/2013   HGB 12.7 07/13/2013   HCT 36.5 07/13/2013   MCV 85.9 07/13/2013   PLT 79* 07/13/2013   Lab Results  Component Value Date   CREATININE 0.60 07/13/2013   BUN 9 07/13/2013   NA 136 07/13/2013   K 3.4* 07/13/2013   CL 105 07/13/2013   CO2 21 07/13/2013    Lab Results  Component Value Date   HGBA1C 11.8* 07/11/2013   Lipid Panel     Component Value Date/Time   CHOL  Value: 180        ATP III CLASSIFICATION:  <200     mg/dL   Desirable  161-096  mg/dL   Borderline High  >=045    mg/dL   High        02/04/8118 0609   TRIG 389* 05/07/2010 0609   HDL 37* 05/07/2010 0609   CHOLHDL 4.9 05/07/2010 0609   VLDL 78* 05/07/2010 0609   LDLCALC  Value: 65        Total Cholesterol/HDL:CHD Risk Coronary Heart Disease Risk Table                     Men   Women  1/2 Average Risk   3.4   3.3  Average Risk       5.0   4.4  2 X Average Risk   9.6   7.1  3 X Average Risk  23.4   11.0        Use the calculated Patient Ratio above and the CHD Risk Table to determine the  patient's CHD Risk.        ATP III CLASSIFICATION (LDL):  <100     mg/dL   Optimal  147-829  mg/dL   Near or Above                    Optimal  130-159  mg/dL   Borderline  562-130  mg/dL   High  >865     mg/dL  Very High 05/07/2010 0609     Assessment and plan:   Patient Active Problem List   Diagnosis Date Noted  . HTN (hypertension) 07/22/2013  . S/P liver transplant 07/11/2013  . HCV (hepatitis C virus) 07/11/2013  . Type II diabetes mellitus, uncontrolled 07/11/2013    The abscesses are too large to consider I&D in the office setting because we are not able to provided adequate pain control and not able to provide the IV antibiotics that the patient is going to need.  She is at very high risk for bacteremia and sepsis.  I am sending her to the ER for further evaluation, blood cultures, IV antibiotics, GYN surgical consult, etc.    Pt declined EMS transport.  Husband says that he will drive her directly to ER.  It was stressed to patient the importance that they go directly over to the ER for treatment.  They both verbalized understanding.    Called ER and reported patient history and signed out.   BS checked in office 333.    RTC after treatment.    Rodney Langton, MD, CDE, FAAFP [facility] [place], [place]

## 2013-07-31 DIAGNOSIS — R739 Hyperglycemia, unspecified: Secondary | ICD-10-CM | POA: Insufficient documentation

## 2013-07-31 LAB — CBC WITH DIFFERENTIAL/PLATELET
Basophils Relative: 0 % (ref 0–1)
Eosinophils Absolute: 0.1 10*3/uL (ref 0.0–0.7)
Eosinophils Relative: 3 % (ref 0–5)
HCT: 31 % — ABNORMAL LOW (ref 36.0–46.0)
Lymphs Abs: 1.5 10*3/uL (ref 0.7–4.0)
MCH: 29.7 pg (ref 26.0–34.0)
MCV: 87.8 fL (ref 78.0–100.0)
Neutro Abs: 2.1 10*3/uL (ref 1.7–7.7)
Platelets: 91 10*3/uL — ABNORMAL LOW (ref 150–400)
RBC: 3.53 MIL/uL — ABNORMAL LOW (ref 3.87–5.11)

## 2013-07-31 LAB — BASIC METABOLIC PANEL
BUN: 9 mg/dL (ref 6–23)
CO2: 24 mEq/L (ref 19–32)
Chloride: 105 mEq/L (ref 96–112)
Potassium: 3.3 mEq/L — ABNORMAL LOW (ref 3.5–5.1)
Sodium: 139 mEq/L (ref 135–145)

## 2013-07-31 LAB — COMPREHENSIVE METABOLIC PANEL
AST: 10 U/L (ref 0–37)
Albumin: 2.7 g/dL — ABNORMAL LOW (ref 3.5–5.2)
Alkaline Phosphatase: 219 U/L — ABNORMAL HIGH (ref 39–117)
BUN: 11 mg/dL (ref 6–23)
Calcium: 8.6 mg/dL (ref 8.4–10.5)
GFR calc Af Amer: >90 mL/min (ref >90)
Glucose, Bld: 204 mg/dL — ABNORMAL HIGH (ref 70–99)
Potassium: 3.3 mEq/L — ABNORMAL LOW (ref 3.5–5.1)
Sodium: 136 mEq/L (ref 135–145)
Total Protein: 6.9 g/dL (ref 6.0–8.3)

## 2013-07-31 LAB — CBC
HCT: 33.2 % — ABNORMAL LOW (ref 36.0–46.0)
MCH: 29.9 pg (ref 26.0–34.0)
MCV: 87.8 fL (ref 78.0–100.0)
RBC: 3.78 MIL/uL — ABNORMAL LOW (ref 3.87–5.11)
WBC: 4.2 10*3/uL (ref 4.0–10.5)

## 2013-07-31 LAB — GLUCOSE, CAPILLARY
Glucose-Capillary: 110 mg/dL — ABNORMAL HIGH (ref 70–99)
Glucose-Capillary: 164 mg/dL — ABNORMAL HIGH (ref 70–99)
Glucose-Capillary: 171 mg/dL — ABNORMAL HIGH (ref 70–99)
Glucose-Capillary: 97 mg/dL (ref 70–99)

## 2013-07-31 MED ORDER — TACROLIMUS 1 MG PO CAPS
2.0000 mg | ORAL_CAPSULE | Freq: Two times a day (BID) | ORAL | Status: DC
  Administered 2013-07-31 – 2013-08-01 (×3): 2 mg via ORAL
  Filled 2013-07-31 (×4): qty 2

## 2013-07-31 MED ORDER — NYSTATIN 100000 UNIT/GM EX OINT
TOPICAL_OINTMENT | Freq: Three times a day (TID) | CUTANEOUS | Status: DC
  Administered 2013-07-31 – 2013-08-01 (×4): via TOPICAL
  Filled 2013-07-31 (×2): qty 15

## 2013-07-31 MED ORDER — AZATHIOPRINE 50 MG PO TABS
200.0000 mg | ORAL_TABLET | Freq: Every day | ORAL | Status: DC
  Administered 2013-07-31 – 2013-08-01 (×2): 200 mg via ORAL
  Filled 2013-07-31 (×2): qty 4

## 2013-07-31 MED ORDER — VANCOMYCIN HCL IN DEXTROSE 1-5 GM/200ML-% IV SOLN
1000.0000 mg | Freq: Three times a day (TID) | INTRAVENOUS | Status: DC
  Administered 2013-07-31 – 2013-08-01 (×5): 1000 mg via INTRAVENOUS
  Filled 2013-07-31 (×7): qty 200

## 2013-07-31 NOTE — Progress Notes (Signed)
ANTIBIOTIC CONSULT NOTE - follow-up  Pharmacy Consult for Vancomycin Indication: vulvar abscess  No Known Allergies  Patient Measurements: Height: 5\' 11"  (180.3 cm) Weight: 209 lb 3.5 oz (94.9 kg) IBW/kg (Calculated) : 70.8 Adjusted Body Weight:   Vital Signs: Temp: 98.5 F (36.9 C) (10/03 0620) Temp src: Oral (10/03 0620) BP: 123/85 mmHg (10/03 0620) Pulse Rate: 73 (10/03 0620) Intake/Output from previous day: 10/02 0701 - 10/03 0700 In: 1006.7 [I.V.:1006.7] Out: -  Intake/Output from this shift:    Labs:  Recent Labs  07/31/13 0115 07/31/13 0532  WBC 4.1 4.2  HGB 10.5* 11.3*  PLT 91* 99*  CREATININE 0.60 0.62   Estimated Creatinine Clearance: 98.5 ml/min (by C-G formula based on Cr of 0.62). No results found for this basename: VANCOTROUGH, Leodis Binet, VANCORANDOM, GENTTROUGH, GENTPEAK, GENTRANDOM, TOBRATROUGH, TOBRAPEAK, TOBRARND, AMIKACINPEAK, AMIKACINTROU, AMIKACIN,  in the last 72 hours   Microbiology: Recent Results (from the past 720 hour(s))  MRSA PCR SCREENING     Status: None   Collection Time    07/11/13  8:03 PM      Result Value Range Status   MRSA by PCR NEGATIVE  NEGATIVE Final   Comment:            The GeneXpert MRSA Assay (FDA     approved for NASAL specimens     only), is one component of a     comprehensive MRSA colonization     surveillance program. It is not     intended to diagnose MRSA     infection nor to guide or     monitor treatment for     MRSA infections.    Medical History: Past Medical History  Diagnosis Date  . Diabetes mellitus without complication     Medications:  Anti-infectives   Start     Dose/Rate Route Frequency Ordered Stop   07/31/13 0015  vancomycin (VANCOCIN) IVPB 1000 mg/200 mL premix     1,000 mg 200 mL/hr over 60 Minutes Intravenous 3 times per day 07/31/13 0008       Assessment: HPI: 3 YOF with vulvar abscess, she has h/o liver transplant on tacrolimus/prednisone  07/31/2013 >> vanc >>  Tmax:  afeb WBCs: 4.2 Renal: SCr = 0.62 for est CrCl = 37ml/min (C-G) and >128ml/min (N)  10/3: blood: pending 10/3: wound: pending  Dose changes/drug level info:  10/4: VT at 0530 = ____mcg/ml prior to 5th dose of 1gm IV q8h  Goal of Therapy:  Vancomycin trough level 15-20 mcg/ml  Plan:   Continue vancomycin 1gm IV q8h d/t immunosuppression and need for more aggressive vancomycin trough  Check trough Saturday am with am labs  Monitor renal fx closely with concomitant tacrolimus  Juliette Alcide, PharmD, BCPS.   Pager: [phone]  07/31/2013,8:22 AM

## 2013-07-31 NOTE — Progress Notes (Signed)
ANTIBIOTIC CONSULT NOTE - INITIAL  Pharmacy Consult for Vancomycin Indication: vulvar abscess  No Known Allergies  Patient Measurements: Height: 5\' 11"  (180.3 cm) Weight: 209 lb (94.802 kg) IBW/kg (Calculated) : 70.8 Adjusted Body Weight:   Vital Signs: Temp: 98 F (36.7 C) (10/02 2236) Temp src: Oral (10/02 1809) BP: 95/68 mmHg (10/02 2236) Pulse Rate: 80 (10/02 2236) Intake/Output from previous day:   Intake/Output from this shift:    Labs: No results found for this basename: WBC, HGB, PLT, LABCREA, CREATININE,  in the last 72 hours Estimated Creatinine Clearance: 98.5 ml/min (by C-G formula based on Cr of 0.6). No results found for this basename: VANCOTROUGH, Leodis Binet, VANCORANDOM, GENTTROUGH, GENTPEAK, GENTRANDOM, TOBRATROUGH, TOBRAPEAK, TOBRARND, AMIKACINPEAK, AMIKACINTROU, AMIKACIN,  in the last 72 hours   Microbiology: Recent Results (from the past 720 hour(s))  MRSA PCR SCREENING     Status: None   Collection Time    07/11/13  8:03 PM      Result Value Range Status   MRSA by PCR NEGATIVE  NEGATIVE Final   Comment:            The GeneXpert MRSA Assay (FDA     approved for NASAL specimens     only), is one component of a     comprehensive MRSA colonization     surveillance program. It is not     intended to diagnose MRSA     infection nor to guide or     monitor treatment for     MRSA infections.    Medical History: Past Medical History  Diagnosis Date  . Diabetes mellitus without complication     Medications:  Anti-infectives   Start     Dose/Rate Route Frequency Ordered Stop   07/31/13 0015  vancomycin (VANCOCIN) IVPB 1000 mg/200 mL premix     1,000 mg 200 mL/hr over 60 Minutes Intravenous 3 times per day 07/31/13 0008       Assessment: Patient with vulvar abscess.    Goal of Therapy:  Vancomycin trough level 15-20 mcg/ml  Plan:  Measure antibiotic drug levels at steady state Follow up culture results Vancomycin 1gm iv q8hr  Kathryn Walsh, Kathryn Walsh 07/31/2013,12:11 AM

## 2013-07-31 NOTE — Consult Note (Signed)
  Patient with persistent pain in vulva region this morning, also complaining of persistent "burning" sensation in groin region. Patient reports area on mons started draining this morning. Patient is otherwise doing well.  Filed Vitals:   07/31/13 0620  BP: 123/85  Pulse: 73  Temp: 98.5 F (36.9 C)  Resp: 16   GENERAL: Well-developed, well-nourished female in no acute distress.  PELVIC: Area of induration on mons now localized to left half of mons. Still erythematous, tender to touch. Small area of fluctuance (1cm) with pin point opening draining foul smelling purulent discharge. Bilateral groin with skin yeast infection EXTREMITIES: No cyanosis, clubbing, or edema, 2+ distal pulses.  A/P 57 yo with vulva abscess in the setting of poorly controlled DM, CHTN and immunosuppressed state secondary to h/o liver transplant - Continue IV antibiotics - Nystatin ointment to groin  - Warm compresses to vulva - Contact GYN attending at [phone] for any question

## 2013-07-31 NOTE — Progress Notes (Signed)
Inpatient Diabetes Program Recommendations  AACE/ADA: New Consensus Statement on Inpatient Glycemic Control (2013)  Target Ranges:  Prepandial:   less than 140 mg/dL      Peak postprandial:   less than 180 mg/dL (1-2 hours)      Critically ill patients:  140 - 180 mg/dL     Results for REBEL, LAUGHRIDGE (MRN [id number]) as of 07/31/2013 09:50  Ref. Range 07/31/2013 00:13 07/31/2013 08:00  Glucose-Capillary Latest Range: 70-99 mg/dL 409 (H) 811 (H)    **Patient admitted with vaginal abscess/cellulitis.  Was recently admitted to [facility] hospital back in September for uncontrolled DM.  Has established care now at the [facility].  Saw Dr. Elisabeth Pigeon in the clinic on 09/24 and saw Dr. Laural Benes in the clinic on 10/02.  Was sent urgently to the [facility] ED for the above issue.    **Noted home dose of Lantus started at midnight.  CBGs stable so far.  Will follow. Ambrose Finland RN, MSN, CDE Diabetes Coordinator Inpatient Diabetes Program Team Pager: [phone] (8a-10p)

## 2013-07-31 NOTE — Progress Notes (Signed)
Kathryn Walsh AVW:[id number] DOB: [birth year] DOA: 07/30/2013 PCP: Jeanann Lewandowsky, MD  Brief narrative: 57 yr old ?, known uncontrolled Ty 2 dm, s/p liver transplant 1999 [? HCV] on immunosuppressants, [facility] mons pubis cellulitis and abscesses  Past medical history-As per Problem list Chart reviewed as below-  Consultants:  Dr. Jolayne Panther of OB/GYN  Procedures:  None  Antibiotics:  Vancomycin 10/2   Subjective  In a lot of pain Patient seen with Dr. Jolayne Panther Area of mons pubis now draining Denies nausea vomiting tolerating diet   Objective    Interim History: None  Telemetry: None telemetry  Objective: Filed Vitals:   07/31/13 0006 07/31/13 0010 07/31/13 0620 07/31/13 1317  BP:  110/70 123/85 108/74  Pulse:  78 73 69  Temp:  98.5 F (36.9 C) 98.5 F (36.9 C) 98.6 F (37 C)  TempSrc:  Oral Oral Oral  Resp:  20 16 14   Height: 5\' 11"  (1.803 m)     Weight: 94.802 kg (209 lb) 94.9 kg (209 lb 3.5 oz)    SpO2:  99% 98% 97%    Intake/Output Summary (Last 24 hours) at 07/31/13 1540 Last data filed at 07/31/13 1317  Gross per 24 hour  Intake 1726.67 ml  Output      0 ml  Net 1726.67 ml    Exam:  General: Alert but in some painful distress Cardiovascular: S1-S2 no murmur rub or gallop Respiratory: Clinically clear Abdomen: Soft, mons pubis has areas of fluctuance and purulent drainage Skin see above Neuro intact euthymic  Data Reviewed: Basic Metabolic Panel:  Recent Labs Lab 07/31/13 0115 07/31/13 0532  NA 136 139  K 3.3* 3.3*  CL 105 105  CO2 22 24  GLUCOSE 204* 108*  BUN 11 9  CREATININE 0.60 0.62  CALCIUM 8.6 8.8   Liver Function Tests:  Recent Labs Lab 07/31/13 0115  AST 10  ALT 13  ALKPHOS 219*  BILITOT 0.3  PROT 6.9  ALBUMIN 2.7*   No results found for this basename: LIPASE, AMYLASE,  in the last 168 hours No results found for this basename: AMMONIA,  in the last 168  hours CBC:  Recent Labs Lab 07/31/13 0115 07/31/13 0532  WBC 4.1 4.2  NEUTROABS 2.1  --   HGB 10.5* 11.3*  HCT 31.0* 33.2*  MCV 87.8 87.8  PLT 91* 99*   Cardiac Enzymes: No results found for this basename: CKTOTAL, CKMB, CKMBINDEX, TROPONINI,  in the last 168 hours BNP: No components found with this basename: POCBNP,  CBG:  Recent Labs Lab 07/31/13 0013 07/31/13 0800 07/31/13 1155  GLUCAP 171* 110* 142*    Recent Results (from the past 240 hour(s))  WOUND CULTURE     Status: None   Collection Time    07/31/13 12:20 AM      Result Value Range Status   Specimen Description VULVA   Final   Special Requests Immunocompromised   Final   Gram Stain     Final   Value: NO WBC SEEN     RARE SQUAMOUS EPITHELIAL CELLS PRESENT     ABUNDANT GRAM POSITIVE COCCI IN PAIRS     Performed at [facility]   Culture PENDING   Incomplete   Report Status PENDING   Incomplete     Studies:              All Imaging reviewed and is as per above notation   Scheduled Meds: . amLODipine  10  mg Oral Daily  . aspirin  81 mg Oral Daily  . azaTHIOprine  200 mg Oral Daily  . heparin  5,000 Units Subcutaneous Q8H  . insulin aspart  0-15 Units Subcutaneous TID WC  . insulin aspart  0-5 Units Subcutaneous QHS  . insulin aspart  8 Units Subcutaneous TID WC  . insulin glargine  70 Units Subcutaneous QHS  . nystatin ointment   Topical TID  . pantoprazole  40 mg Oral Daily  . potassium chloride SA  20 mEq Oral Daily  . predniSONE  5 mg Oral Daily  . propranolol  20 mg Oral BID  . tacrolimus  2 mg Oral BID  . vancomycin  1,000 mg Intravenous Q8H   Continuous Infusions: . sodium chloride 100 mL/hr at 07/31/13 1402     Assessment/Plan: 1. Mons pubis cellulitis-continue vancomycin.  Appreciate GYN consult and management 2. Candidiasis, vulvar-continue nystatin ointment 3 times a day-potentially may need fluconazole for better bioavailability 3. Diabetes mellitus type-blood sugars  relatively better controlled now-ranges are between 171 and 142. Please note patient is on suppressive prednisone 5 mg daily-continue Lantus 70 units subcutaneously, regular insulin 8 units 3 times a day with meals, sliding scale coverage as well 4. Status post liver transplantation-continue tacrolimus 2 mg twice a day, Imuran 200 daily, prednisone 5 mg daily 5. Hypokalemia-replacing with potassium 20 mEq daily 6. GERD-continue pantoprazole 40 daily 7. Hypertension-continue propanolol 20 mg twice a day Levert Feinstein is a once a day drug]? As well as amlodipine 10 daily   Code Status: Full Family Communication: none at bedside Disposition Plan: inpatient, not ready for d/c   Pleas Koch, MD  [facility] Pager [phone] 07/31/2013, 3:40 PM    LOS: 1 day

## 2013-08-01 LAB — CBC WITH DIFFERENTIAL/PLATELET
Basophils Absolute: 0 10*3/uL (ref 0.0–0.1)
Basophils Relative: 0 % (ref 0–1)
Eosinophils Absolute: 0.1 10*3/uL (ref 0.0–0.7)
Eosinophils Relative: 3 % (ref 0–5)
Hemoglobin: 11.3 g/dL — ABNORMAL LOW (ref 12.0–15.0)
MCH: 29.7 pg (ref 26.0–34.0)
MCV: 87.4 fL (ref 78.0–100.0)
Neutrophils Relative %: 52 % (ref 43–77)
Platelets: 111 10*3/uL — ABNORMAL LOW (ref 150–400)
RBC: 3.8 MIL/uL — ABNORMAL LOW (ref 3.87–5.11)
RDW: 13.6 % (ref 11.5–15.5)

## 2013-08-01 LAB — COMPREHENSIVE METABOLIC PANEL
ALT: 10 U/L (ref 0–35)
AST: 13 U/L (ref 0–37)
Albumin: 2.6 g/dL — ABNORMAL LOW (ref 3.5–5.2)
Calcium: 8.8 mg/dL (ref 8.4–10.5)
Creatinine, Ser: 0.56 mg/dL (ref 0.50–1.10)
GFR calc Af Amer: >90 mL/min (ref >90)
Glucose, Bld: 148 mg/dL — ABNORMAL HIGH (ref 70–99)
Potassium: 3.3 mEq/L — ABNORMAL LOW (ref 3.5–5.1)
Sodium: 137 mEq/L (ref 135–145)
Total Protein: 6.8 g/dL (ref 6.0–8.3)

## 2013-08-01 LAB — PROTIME-INR: INR: 1.06 (ref 0.00–1.49)

## 2013-08-01 LAB — GLUCOSE, CAPILLARY
Glucose-Capillary: 164 mg/dL — ABNORMAL HIGH (ref 70–99)
Glucose-Capillary: 74 mg/dL (ref 70–99)

## 2013-08-01 LAB — VANCOMYCIN, TROUGH: Vancomycin Tr: 17.1 ug/mL (ref 10.0–20.0)

## 2013-08-01 MED ORDER — SULFAMETHOXAZOLE-TMP DS 800-160 MG PO TABS: 1.0000 | ORAL_TABLET | Freq: Two times a day (BID) | ORAL | Status: DC

## 2013-08-01 MED ORDER — FLUCONAZOLE 100 MG PO TABS: 100.0000 mg | ORAL_TABLET | Freq: Every day | ORAL | Status: DC

## 2013-08-01 MED ORDER — TRAMADOL HCL 50 MG PO TABS: 50.0000 mg | ORAL_TABLET | Freq: Four times a day (QID) | ORAL | Status: DC | PRN

## 2013-08-01 MED ORDER — NYSTATIN 100000 UNIT/GM EX OINT: TOPICAL_OINTMENT | Freq: Three times a day (TID) | CUTANEOUS | Status: DC

## 2013-08-01 NOTE — Progress Notes (Signed)
   CARE MANAGEMENT NOTE 08/01/2013  Patient:  Kathryn Walsh, Kathryn Walsh   Account Number:  [id number]  Date Initiated:  07/31/2013  Documentation initiated by:  [facility]  Subjective/Objective Assessment:   57 year old female admitted with Vulvar abscess/cellulitis.     Action/Plan:   From home.   Anticipated DC Date:  08/03/2013   Anticipated DC Plan:  HOME/SELF CARE      DC Planning Services  CM consult      [facility] Choice  HOME HEALTH   Choice offered to / List presented to:  C-1 Patient        HH arranged  HH-1 RN      [facility] agency  [facility]   Status of service:  Completed, signed off Medicare Important Message given?  NA - LOS <3 / Initial given by admissions (If response is "NO", the following Medicare IM given date fields will be blank) Date Medicare IM given:   Date Additional Medicare IM given:    Discharge Disposition:  HOME W HOME HEALTH SERVICES  Per UR Regulation:  Reviewed for med. necessity/level of care/duration of stay  If discussed at Long Length of Stay Meetings, dates discussed:    Comments:  08/01/2013 1110 NCM spoke to pt and offered choice for [facility]. Pt agreeable to [facility] for HH. Notified [facility] of dc home today. Isidoro Donning RN CCM Case Mgmt phone [phone]

## 2013-08-01 NOTE — Progress Notes (Signed)
ANTIBIOTIC CONSULT NOTE - FOLLOW UP  Pharmacy Consult for Vancomycin Indication: vulvar abscess   No Known Allergies  Patient Measurements: Height: 5\' 11"  (180.3 cm) Weight: 209 lb 3.5 oz (94.9 kg) IBW/kg (Calculated) : 70.8 Adjusted Body Weight:   Vital Signs: Temp: 98.5 F (36.9 C) (10/04 0554) Temp src: Oral (10/04 0554) BP: 124/75 mmHg (10/04 0554) Pulse Rate: 73 (10/04 0554) Intake/Output from previous day: 10/03 0701 - 10/04 0700 In: 720 [P.O.:720] Out: -  Intake/Output from this shift:    Labs:  Recent Labs  07/31/13 0115 07/31/13 0532  WBC 4.1 4.2  HGB 10.5* 11.3*  PLT 91* 99*  CREATININE 0.60 0.62   Estimated Creatinine Clearance: 98.5 ml/min (by C-G formula based on Cr of 0.62).  Recent Labs  08/01/13 0523  VANCOTROUGH 17.1     Microbiology: Recent Results (from the past 720 hour(s))  MRSA PCR SCREENING     Status: None   Collection Time    07/11/13  8:03 PM      Result Value Range Status   MRSA by PCR NEGATIVE  NEGATIVE Final   Comment:            The GeneXpert MRSA Assay (FDA     approved for NASAL specimens     only), is one component of a     comprehensive MRSA colonization     surveillance program. It is not     intended to diagnose MRSA     infection nor to guide or     monitor treatment for     MRSA infections.  WOUND CULTURE     Status: None   Collection Time    07/31/13 12:20 AM      Result Value Range Status   Specimen Description VULVA   Final   Special Requests Immunocompromised   Final   Gram Stain     Final   Value: NO WBC SEEN     RARE SQUAMOUS EPITHELIAL CELLS PRESENT     ABUNDANT GRAM POSITIVE COCCI IN PAIRS     Performed at [facility]   Culture PENDING   Incomplete   Report Status PENDING   Incomplete    Anti-infectives   Start     Dose/Rate Route Frequency Ordered Stop   07/31/13 0015  vancomycin (VANCOCIN) IVPB 1000 mg/200 mL premix     1,000 mg 200 mL/hr over 60 Minutes Intravenous 3 times per  day 07/31/13 0008        Assessment: Patient with vancomycin level at goal.  Prior doses charted appropriately.    Goal of Therapy:  Vancomycin trough level 15-20 mcg/ml  Plan:  Measure antibiotic drug levels at steady state Follow up culture results Continue with current dose, recheck level at least weekly  Darlina Guys, Jacquenette Shone Crowford 08/01/2013,6:46 AM

## 2013-08-01 NOTE — Consult Note (Signed)
           [facility] OB/GYN Attending Progresss Note   S: Patient reports feeling better today, abscess is significantly decreased and still draining.  No other GYN concerns.  O: BP 124/75  Pulse 73  Temp(Src) 98.5 F (36.9 C) (Oral)  Resp 18  Ht 5\' 11"  (1.803 m)  Wt 209 lb 3.5 oz (94.9 kg)  BMI 29.19 kg/m2  SpO2 96% GENERAL: Well-developed, well-nourished female in no acute distress.  PELVIC: Area of induration on mons now localized to left half of mons and significantly decreased from yesterday. Decreased erythema, tender to touch.  About 7 mm opening with  foul smelling purulent discharge(1cm) with pin point opening draining . Bilateral groin with skin yeast infection, but decreased erythema EXTREMITIES: No cyanosis, clubbing, or edema, 2+ distal pulses.  A/P: 57 yo with resolving vulva abscess in the setting of poorly controlled DM, CHTN and immunosuppressed state secondary to h/o liver transplant - Continue IV antibiotics - Nystatin ointment to groin  - Warm compresses to vulva - No I&D needed given that it is spontaneously draining. Can be managed outpatient with oral antibiotics.  Consider consulting  ID for best choice of oral antibiotics. Needs at least a 14 day regimen.   - Consider home health RN to help clean and examine abscess upon discharge - Patient will be called with appointment in the GYN clinic next week for followup - Please contact GYN attending at [phone] for any questions. No need for further daily rounding at this point.  Appreciate care of Foy [place] by her primary team; will defer to her primary team for management of her other chronic medical issues.  Thank you for helping [place] to take care of this patient   Jaynie Collins, MD, FACOG  Attending Obstetrician & Gynecologist  Faculty Practice, [facility]

## 2013-08-01 NOTE — Discharge Summary (Signed)
Physician Discharge Summary  Kathryn Walsh HQI:[id number] DOB: [birth year] DOA: 07/30/2013  PCP: Jeanann Lewandowsky, MD  Admit date: 07/30/2013 Discharge date: 08/01/2013  Time spent: 40 minutes  Recommendations for Outpatient Follow-up:  1. Continue Bactrim DS until 08/13/13  2. Continue fluconazole from 10/4 to 08/04/13  3. Will copy for this discharge summary to Dr. Laural Benes at [facility] to coordinate further diabetes management-patient has already been instructed specialized diabetic monitoring at home with pre-meal and pulse meal on one day and then post meal and pre-meal on another day to help titrate blood sugars 4. Patient will need to be followed by OB/GYN subacutely for cellulitis management 5. Home health nursing to follow patient at home  Discharge Diagnoses:  Principal Problem:   Cellulitis and abscess Active Problems:   S/P liver transplant   Type II diabetes mellitus, uncontrolled   HTN (hypertension)   Immunosuppressed status   Discharge Condition: Good  Diet recommendation: Diabetic  Filed Weights   07/31/13 0006 07/31/13 0010  Weight: 94.802 kg (209 lb) 94.9 kg (209 lb 3.5 oz)    History of present illness:  57 yr old ?, known uncontrolled Ty 2 dm, s/p liver transplant 1999 [? HCV] on immunosuppressants, [facility] mons pubis cellulitis and abscesses   Hospital Course:  1. Mons pubis cellulitis-continue vancomycin. Appreciate GYN consult and management-did discuss with Dr. Ninetta Lights of ID who confirms Bactrim DS does have some anaerobic coverage and wouldn't interact c immunosuppressants 2. Candidiasis, vulvar-continue nystatin ointment 3 times a day-fluconazole till 10/7 3. Diabetes mellitus type-blood sugars relatively better controlled now-ranges are between 171 and 142. Please note patient is on suppressive prednisone 5 mg daily-continue Lantus 70 units subcutaneously, regular insulin 8 units 3  times a day with meals-explained to her clearly how to monitor sugars bid to get an idea as to how to up-titrate as OP-To follow with Dr. Laural Benes as above 4. Status post liver transplantation-continue tacrolimus 2 mg twice a day, Imuran 200 daily, prednisone 5 mg daily 5. Hypokalemia-replacing with potassium 20 mEq daily 6. GERD-continue pantoprazole 40 daily 7. Hypertension-continue propanolol 20 mg twice a day Levert Feinstein is a once a day drug-deferred to expert management as per PCP] As well as amlodipine 10 daily.  Consultants:  Dr. Jolayne Panther of OB/GYN Telephone consulted infectious disease Dr. Ninetta Lights 08/01/13  Procedures:  None  Antibiotics:  Vancomycin 10/2 Bactrim DS 10/4-->10/16 Fluconazole 10/4--10/7 Continue nystatin until seen by GYN  Discharge Exam: Filed Vitals:   08/01/13 0933  BP: 130/75  Pulse:   Temp:   Resp:     General: Alert pleasant oriented, in NAD Cardiovascular: s1 s 2no m/r/g Respiratory: clear, no added sound Pelvic assesment deferred as per Dr. Mat Carne note  Discharge Instructions   Future Appointments Provider Department Dept Phone   08/21/2013 9:45 AM [facility] Covering Provider [facility] [phone]       Medication List         amLODipine 10 MG tablet  Commonly known as:  NORVASC  Take 10 mg by mouth daily.     aspirin 81 MG chewable tablet  Chew 81 mg by mouth daily.     azaTHIOprine 50 MG tablet  Commonly known as:  IMURAN  Take 200 mg by mouth daily.     fluconazole 100 MG tablet  Commonly known as:  DIFLUCAN  Take 1 tablet (100 mg total) by mouth daily.     insulin aspart 100 UNIT/ML  injection  Commonly known as:  novoLOG  Inject 8 Units into the skin 3 (three) times daily with meals.     insulin glargine 100 UNIT/ML injection  Commonly known as:  LANTUS  Inject 70 Units into the skin at bedtime. Per sliding scale     multivitamin with minerals Tabs tablet  Take 1 tablet by mouth daily.      nystatin ointment  Commonly known as:  MYCOSTATIN  Apply topically 3 (three) times daily.     omeprazole 20 MG capsule  Commonly known as:  PRILOSEC  Take 20 mg by mouth daily.     potassium chloride SA 20 MEQ tablet  Commonly known as:  K-DUR,KLOR-CON  Take 20 mEq by mouth daily.     predniSONE 5 MG tablet  Commonly known as:  DELTASONE  Take 5 mg by mouth daily.     propranolol 20 MG tablet  Commonly known as:  INDERAL  Take 20 mg by mouth 2 (two) times daily.     sulfamethoxazole-trimethoprim 800-160 MG per tablet  Commonly known as:  BACTRIM DS  Take 1 tablet by mouth 2 (two) times daily.     tacrolimus 1 MG capsule  Commonly known as:  PROGRAF  Take 2 mg by mouth 2 (two) times daily.     traMADol 50 MG tablet  Commonly known as:  ULTRAM  Take 1 tablet (50 mg total) by mouth every 6 (six) hours as needed for pain.       No Known Allergies    The results of significant diagnostics from this hospitalization (including imaging, microbiology, ancillary and laboratory) are listed below for reference.    Significant Diagnostic Studies: Dg Chest 2 View  07/11/2013   *RADIOLOGY REPORT*  Clinical Data: Hyperglycemic, hypertension.  CHEST - 2 VIEW  Comparison: May 07, 2010.  Findings: Cardiomediastinal silhouette appears normal.  No acute pulmonary disease is noted.  Bony thorax is intact.  IMPRESSION: No acute cardiopulmonary abnormality seen.   Original Report Authenticated By: Lupita Raider.,  M.D.    Microbiology: Recent Results (from the past 240 hour(s))  WOUND CULTURE     Status: None   Collection Time    07/31/13 12:20 AM      Result Value Range Status   Specimen Description VULVA   Final   Special Requests Immunocompromised   Final   Gram Stain     Final   Value: NO WBC SEEN     RARE SQUAMOUS EPITHELIAL CELLS PRESENT     ABUNDANT GRAM POSITIVE COCCI IN PAIRS     Performed at [facility]   Culture PENDING   Incomplete   Report Status  PENDING   Incomplete     Labs: Basic Metabolic Panel:  Recent Labs Lab 07/31/13 0115 07/31/13 0532 08/01/13 0836  NA 136 139 137  K 3.3* 3.3* 3.3*  CL 105 105 107  CO2 22 24 20   GLUCOSE 204* 108* 148*  BUN 11 9 7   CREATININE 0.60 0.62 0.56  CALCIUM 8.6 8.8 8.8   Liver Function Tests:  Recent Labs Lab 07/31/13 0115 08/01/13 0836  AST 10 13  ALT 13 10  ALKPHOS 219* 214*  BILITOT 0.3 0.3  PROT 6.9 6.8  ALBUMIN 2.7* 2.6*   No results found for this basename: LIPASE, AMYLASE,  in the last 168 hours No results found for this basename: AMMONIA,  in the last 168 hours CBC:  Recent Labs Lab 07/31/13 0115 07/31/13 0532 08/01/13 1610  WBC 4.1 4.2 3.9*  NEUTROABS 2.1  --  2.0  HGB 10.5* 11.3* 11.3*  HCT 31.0* 33.2* 33.2*  MCV 87.8 87.8 87.4  PLT 91* 99* 111*   Cardiac Enzymes: No results found for this basename: CKTOTAL, CKMB, CKMBINDEX, TROPONINI,  in the last 168 hours BNP: BNP (last 3 results) No results found for this basename: PROBNP,  in the last 8760 hours CBG:  Recent Labs Lab 07/31/13 0800 07/31/13 1155 07/31/13 1643 07/31/13 2117 08/01/13 0740  GLUCAP 110* 142* 164* 97 164*       Signed:  Rita Vialpando, JAI-GURMUKH  [facility] 08/01/2013, 9:48 AM

## 2013-08-04 LAB — WOUND CULTURE: Gram Stain: NONE SEEN

## 2013-08-06 ENCOUNTER — Ambulatory Visit (INDEPENDENT_AMBULATORY_CARE_PROVIDER_SITE_OTHER): Payer: Medicare Other | Admitting: Obstetrics & Gynecology

## 2013-08-06 ENCOUNTER — Encounter: Payer: Self-pay | Admitting: Obstetrics & Gynecology

## 2013-08-06 VITALS — BP 114/77 | HR 78 | Temp 98.1°F | Ht 66.0 in | Wt 205.0 lb

## 2013-08-06 DIAGNOSIS — Z01419 Encounter for gynecological examination (general) (routine) without abnormal findings: Secondary | ICD-10-CM

## 2013-08-06 DIAGNOSIS — Z1231 Encounter for screening mammogram for malignant neoplasm of breast: Secondary | ICD-10-CM

## 2013-08-06 DIAGNOSIS — N760 Acute vaginitis: Secondary | ICD-10-CM

## 2013-08-06 LAB — CULTURE, BLOOD (ROUTINE X 2): Culture: NO GROWTH

## 2013-08-06 MED ORDER — NYSTATIN 100000 UNIT/GM EX CREA: TOPICAL_CREAM | Freq: Two times a day (BID) | CUTANEOUS | Status: DC

## 2013-08-06 NOTE — Patient Instructions (Signed)
Abscess  Care After  An abscess (also called a boil or furuncle) is an infected area that contains a collection of pus. Signs and symptoms of an abscess include pain, tenderness, redness, or hardness, or you may feel a moveable soft area under your skin. An abscess can occur anywhere in the body. The infection may spread to surrounding tissues causing cellulitis. A cut (incision) by the surgeon was made over your abscess and the pus was drained out. Gauze may have been packed into the space to provide a drain that will allow the cavity to heal from the inside outwards. The boil may be painful for 5 to 7 days. Most people with a boil do not have high fevers. Your abscess, if seen early, may not have localized, and may not have been lanced. If not, another appointment may be required for this if it does not get better on its own or with medications.  HOME CARE INSTRUCTIONS   · Only take over-the-counter or prescription medicines for pain, discomfort, or fever as directed by your caregiver.  · When you bathe, soak and then remove gauze or iodoform packs at least daily or as directed by your caregiver. You may then wash the wound gently with mild soapy water. Repack with gauze or do as your caregiver directs.  SEEK IMMEDIATE MEDICAL CARE IF:   · You develop increased pain, swelling, redness, drainage, or bleeding in the wound site.  · You develop signs of generalized infection including muscle aches, chills, fever, or a general ill feeling.  · An oral temperature above 102° F (38.9° C) develops, not controlled by medication.  See your caregiver for a recheck if you develop any of the symptoms described above. If medications (antibiotics) were prescribed, take them as directed.  Document Released: 05/03/2005 Document Revised: 01/07/2012 Document Reviewed: 12/29/2007  ExitCare® Patient Information ©2014 [employer].

## 2013-08-06 NOTE — Progress Notes (Signed)
Subjective:     Patient ID: Kathryn Walsh, female   DOB: 01/04/1956, 57 y.o.   MRN: [id number]  HPI Pt was seen in consultation while in the [facility] hosp.  She was noted to have a large vulvar abscess that was already draining.  She is here for f/u.  She denies new onset problems and reports that her GLC is improved since she left the hosp.  She does c/o itching of the outside of the vulva since taking atbx.  Review of Systems     Objective:   Physical Exam BP 114/77  Pulse 78  Temp(Src) 98.1 F (36.7 C) (Oral)  Ht 5\' 6"  (1.676 m)  Wt 205 lb (92.987 kg)  BMI 33.1 kg/m2 Pt in NAD GU: EGBUS:healing abscess on the vulva on the left side.  There is induration but no fluctuance and there are 2 opening with no active drainage even with attempt of expression.         Assessment:     vulvar abscess- improved Yeast vulvovaginitis     Plan:     Keep area clean and dry F/u if drainage returns. F/u in 6-8 weeks for annual GYN exam Pt to schedule mammogram when her letter arrives from [facility] that it is due. Nystatin cream tid

## 2013-08-21 ENCOUNTER — Ambulatory Visit: Payer: Medicare Other

## 2013-09-15 ENCOUNTER — Ambulatory Visit: Payer: Medicare Other | Admitting: Internal Medicine

## 2013-10-06 ENCOUNTER — Encounter: Payer: Self-pay | Admitting: Internal Medicine

## 2013-10-06 ENCOUNTER — Ambulatory Visit: Payer: Medicare Other | Attending: Internal Medicine | Admitting: Internal Medicine

## 2013-10-06 VITALS — BP 142/89 | HR 69 | Temp 99.6°F | Resp 16 | Ht 66.0 in | Wt 205.0 lb

## 2013-10-06 DIAGNOSIS — E1165 Type 2 diabetes mellitus with hyperglycemia: Secondary | ICD-10-CM

## 2013-10-06 DIAGNOSIS — F3289 Other specified depressive episodes: Secondary | ICD-10-CM | POA: Insufficient documentation

## 2013-10-06 DIAGNOSIS — Z23 Encounter for immunization: Secondary | ICD-10-CM

## 2013-10-06 DIAGNOSIS — I10 Essential (primary) hypertension: Secondary | ICD-10-CM

## 2013-10-06 DIAGNOSIS — IMO0001 Reserved for inherently not codable concepts without codable children: Secondary | ICD-10-CM | POA: Insufficient documentation

## 2013-10-06 DIAGNOSIS — Z944 Liver transplant status: Secondary | ICD-10-CM

## 2013-10-06 DIAGNOSIS — F32A Depression, unspecified: Secondary | ICD-10-CM | POA: Insufficient documentation

## 2013-10-06 DIAGNOSIS — N1831 Chronic kidney disease, stage 3a: Secondary | ICD-10-CM | POA: Insufficient documentation

## 2013-10-06 DIAGNOSIS — E1169 Type 2 diabetes mellitus with other specified complication: Secondary | ICD-10-CM | POA: Insufficient documentation

## 2013-10-06 DIAGNOSIS — E119 Type 2 diabetes mellitus without complications: Secondary | ICD-10-CM

## 2013-10-06 DIAGNOSIS — F329 Major depressive disorder, single episode, unspecified: Secondary | ICD-10-CM | POA: Insufficient documentation

## 2013-10-06 LAB — POCT GLYCOSYLATED HEMOGLOBIN (HGB A1C): Hemoglobin A1C: 5.9

## 2013-10-06 MED ORDER — ESCITALOPRAM OXALATE 10 MG PO TABS: 10.0000 mg | ORAL_TABLET | Freq: Every day | ORAL | Status: DC

## 2013-10-06 MED ORDER — AMLODIPINE BESYLATE 10 MG PO TABS: 10.0000 mg | ORAL_TABLET | Freq: Every day | ORAL | Status: DC

## 2013-10-06 NOTE — Patient Instructions (Signed)
Hypertension  As your heart beats, it forces blood through your arteries. This force is your blood pressure. If the pressure is too high, it is called hypertension (HTN) or high blood pressure. HTN is dangerous because you may have it and not know it. High blood pressure may mean that your heart has to work harder to pump blood. Your arteries may be narrow or stiff. The extra work puts you at risk for heart disease, stroke, and other problems.   Blood pressure consists of two numbers, a higher number over a lower, 110/72, for example. It is stated as "110 over 72." The ideal is below 120 for the top number (systolic) and under 80 for the bottom (diastolic). Write down your blood pressure today.  You should pay close attention to your blood pressure if you have certain conditions such as:  · Heart failure.  · Prior heart attack.  · Diabetes  · Chronic kidney disease.  · Prior stroke.  · Multiple risk factors for heart disease.  To see if you have HTN, your blood pressure should be measured while you are seated with your arm held at the level of the heart. It should be measured at least twice. A one-time elevated blood pressure reading (especially in the Emergency Department) does not mean that you need treatment. There may be conditions in which the blood pressure is different between your right and left arms. It is important to see your caregiver soon for a recheck.  Most people have essential hypertension which means that there is not a specific cause. This type of high blood pressure may be lowered by changing lifestyle factors such as:  · Stress.  · Smoking.  · Lack of exercise.  · Excessive weight.  · Drug/tobacco/alcohol use.  · Eating less salt.  Most people do not have symptoms from high blood pressure until it has caused damage to the body. Effective treatment can often prevent, delay or reduce that damage.  TREATMENT   When a cause has been identified, treatment for high blood pressure is directed at the  cause. There are a large number of medications to treat HTN. These fall into several categories, and your caregiver will help you select the medicines that are best for you. Medications may have side effects. You should review side effects with your caregiver.  If your blood pressure stays high after you have made lifestyle changes or started on medicines,   · Your medication(s) may need to be changed.  · Other problems may need to be addressed.  · Be certain you understand your prescriptions, and know how and when to take your medicine.  · Be sure to follow up with your caregiver within the time frame advised (usually within two weeks) to have your blood pressure rechecked and to review your medications.  · If you are taking more than one medicine to lower your blood pressure, make sure you know how and at what times they should be taken. Taking two medicines at the same time can result in blood pressure that is too low.  SEEK IMMEDIATE MEDICAL CARE IF:  · You develop a severe headache, blurred or changing vision, or confusion.  · You have unusual weakness or numbness, or a faint feeling.  · You have severe chest or abdominal pain, vomiting, or breathing problems.  MAKE SURE YOU:   · Understand these instructions.  · Will watch your condition.  · Will get help right away if you are not doing well   or get worse.  Document Released: 10/15/2005 Document Revised: 01/07/2012 Document Reviewed: 06/04/2008  ExitCare® Patient Information ©2014 [employer].    Diabetes and Exercise  Exercising regularly is important. It is not just about losing weight. It has many health benefits, such as:  · Improving your overall fitness, flexibility, and endurance.  · Increasing your bone density.  · Helping with weight control.  · Decreasing your body fat.  · Increasing your muscle strength.  · Reducing stress and tension.  · Improving your overall health.  People with diabetes who exercise gain additional benefits because  exercise:  · Reduces appetite.  · Improves the body's use of blood sugar (glucose).  · Helps lower or control blood glucose.  · Decreases blood pressure.  · Helps control blood lipids (such as cholesterol and triglycerides).  · Improves the body's use of the hormone insulin by:  · Increasing the body's insulin sensitivity.  · Reducing the body's insulin needs.  · Decreases the risk for heart disease because exercising:  · Lowers cholesterol and triglycerides levels.  · Increases the levels of good cholesterol (such as high-density lipoproteins [HDL]) in the body.  · Lowers blood glucose levels.  YOUR ACTIVITY PLAN   Choose an activity that you enjoy and set realistic goals. Your health care provider or diabetes educator can help you make an activity plan that works for you. You can break activities into 2 or 3 sessions throughout the day. Doing so is as good as one long session. Exercise ideas include:  · Taking the dog for a walk.  · Taking the stairs instead of the elevator.  · Dancing to your favorite song.  · Doing your favorite exercise with a friend.  RECOMMENDATIONS FOR EXERCISING WITH TYPE 1 OR TYPE 2 DIABETES   · Check your blood glucose before exercising. If blood glucose levels are greater than 240 mg/dL, check for urine ketones. Do not exercise if ketones are present.  · Avoid injecting insulin into areas of the body that are going to be exercised. For example, avoid injecting insulin into:  · The arms when playing tennis.  · The legs when jogging.  · Keep a record of:  · Food intake before and after you exercise.  · Expected peak times of insulin action.  · Blood glucose levels before and after you exercise.  · The type and amount of exercise you have done.  · Review your records with your health care provider. Your health care provider will help you to develop guidelines for adjusting food intake and insulin amounts before and after exercising.  · If you take insulin or oral hypoglycemic agents, watch  for signs and symptoms of hypoglycemia. They include:  · Dizziness.  · Shaking.  · Sweating.  · Chills.  · Confusion.  · Drink plenty of water while you exercise to prevent dehydration or heat stroke. Body water is lost during exercise and must be replaced.  · Talk to your health care provider before starting an exercise program to make sure it is safe for you. Remember, almost any type of activity is better than none.  Document Released: 01/05/2004 Document Revised: 06/17/2013 Document Reviewed: 03/24/2013  ExitCare® Patient Information ©2014 [employer].

## 2013-10-06 NOTE — Progress Notes (Signed)
Pt is here following up on her diabetes and HTN.  

## 2013-10-07 NOTE — Progress Notes (Signed)
Patient ID: Kathryn Walsh, female   DOB: [birth year], 57 y.o.   MRN: [id number] Patient Demographics  Kathryn Walsh, is a 57 y.o. female  [id number]  [id number]  DOB - 12/21/1955  Chief Complaint  Patient presents with  . Follow-up        Subjective:   Kathryn Walsh is a 57 y.o. female here today for a follow up visit. 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 She is here today for refill of her medications. She said she is doing very well at home, she is compliant with medications, no complaints. She is also here for  flu shot. She follows up with [facility] for liver transplant. She has lab ordered for the future. She has had a pneumococcal vaccine. She continue to smoke cigarette. She does not drink alcohol Patient has No headache, No chest pain, No abdominal pain - No Nausea, No new weakness tingling or numbness, No Cough - SOB.  ALLERGIES: No Known Allergies  PAST MEDICAL HISTORY: Past Medical History  Diagnosis Date  . Diabetes mellitus without complication     MEDICATIONS AT HOME: Prior to Admission medications   Medication Sig Start Date End Date Taking? Authorizing Provider  amLODipine (NORVASC) 10 MG tablet Take 1 tablet (10 mg total) by mouth daily. 10/06/13  Yes Jeanann Lewandowsky, MD  aspirin 81 MG chewable tablet Chew 81 mg by mouth daily.   Yes Historical Provider, MD  azaTHIOprine (IMURAN) 50 MG tablet Take 200 mg by mouth daily.   Yes Historical Provider, MD  escitalopram (LEXAPRO) 10 MG tablet Take 1 tablet (10 mg total) by mouth daily. 10/06/13  Yes Jeanann Lewandowsky, MD  insulin aspart (NOVOLOG) 100 UNIT/ML injection Inject 8 Units into the skin 3 (three) times daily with meals. 07/13/13  Yes Zannie Cove, MD  insulin glargine (LANTUS) 100 UNIT/ML injection Inject 70 Units into the skin at bedtime. Per sliding scale 07/13/13  Yes Zannie Cove, MD  Multiple Vitamin (MULTIVITAMIN WITH MINERALS) TABS tablet Take 1 tablet by mouth daily.   Yes Historical Provider, MD  omeprazole (PRILOSEC) 20 MG capsule Take 20 mg by mouth daily.   Yes Historical Provider, MD  predniSONE (DELTASONE) 5 MG tablet Take 5 mg by mouth daily.   Yes Historical Provider, MD  propranolol (INDERAL) 20 MG tablet Take 20 mg by mouth 2 (two) times daily.   Yes Historical Provider, MD  tacrolimus (PROGRAF) 1 MG capsule Take 2 mg by mouth 2 (two) times daily.   Yes Historical Provider, MD  fluconazole (DIFLUCAN) 100 MG tablet Take 1 tablet (100 mg total) by mouth daily. 08/01/13   Rhetta Mura, MD  nystatin cream (MYCOSTATIN) Apply topically 2 (two) times daily. 08/06/13   Willodean Rosenthal, MD  nystatin ointment (MYCOSTATIN) Apply topically 3 (three) times daily. 08/01/13   Rhetta Mura, MD  potassium chloride SA (K-DUR,KLOR-CON) 20 MEQ tablet Take 20 mEq by mouth daily.    Historical Provider, MD  sulfamethoxazole-trimethoprim (BACTRIM DS) 800-160 MG per tablet Take 1 tablet by mouth 2 (two) times daily. 08/01/13   Rhetta Mura, MD  traMADol (ULTRAM) 50 MG tablet Take 1 tablet (50 mg total) by mouth every 6 (six) hours as needed for pain. 08/01/13   Rhetta Mura, MD     Objective:   Filed Vitals:   10/06/13 1023  BP: 142/89  Pulse: 69  Temp: 99.6 F (37.6 C)  TempSrc: Oral  Resp: 16  Height: 5\' 6"  (1.676 m)  Weight: 205 lb (92.987 kg)  SpO2: 98%    Exam General appearance : Awake, alert, not in any distress. Speech Clear. Not toxic looking HEENT: Atraumatic and Normocephalic, pupils equally reactive to light and accomodation Neck: supple, no JVD. No cervical lymphadenopathy.  Chest:Good air entry bilaterally, no added sounds  CVS: S1 S2 regular, no murmurs.  Abdomen: Bowel sounds present, Non tender and not distended with no gaurding, rigidity or rebound. Extremities: B/L Lower Ext shows no edema, both legs are warm  to touch Neurology: Awake alert, and oriented X 3, CN II-XII intact, Non focal Skin:No Rash Wounds:N/A   Data Review   CBC No results found for this basename: WBC, HGB, HCT, PLT, MCV, MCH, MCHC, RDW, NEUTRABS, LYMPHSABS, MONOABS, EOSABS, BASOSABS, BANDABS, BANDSABD,  in the last 168 hours  Chemistries   No results found for this basename: NA, K, CL, CO2, GLUCOSE, BUN, CREATININE, GFRCGP, CALCIUM, MG, AST, ALT, ALKPHOS, BILITOT,  in the last 168 hours ------------------------------------------------------------------------------------------------------------------  Recent Labs  10/06/13 1030  HGBA1C 5.9   ------------------------------------------------------------------------------------------------------------------ No results found for this basename: CHOL, HDL, LDLCALC, TRIG, CHOLHDL, LDLDIRECT,  in the last 72 hours ------------------------------------------------------------------------------------------------------------------ No results found for this basename: TSH, T4TOTAL, FREET3, T3FREE, THYROIDAB,  in the last 72 hours ------------------------------------------------------------------------------------------------------------------ No results found for this basename: VITAMINB12, FOLATE, FERRITIN, TIBC, IRON, RETICCTPCT,  in the last 72 hours  Coagulation profile  No results found for this basename: INR, PROTIME,  in the last 168 hours    Assessment & Plan   1. Diabetes  - Glucose (CBG) - HgB A1c is 5.9% today, down from 11%  2. Type II diabetes mellitus, uncontrolled Continue present regimen of insulin  Patient has been counseled extensively about nutrition and exercise  - Ambulatory referral to Ophthalmology for diabetic retinopathy screening  3. S/P liver transplant Stable Continue regimen of tacrolimus  4. HTN (hypertension) Continue amlodipine 10 mg tablet by mouth daily Continue propranolol 20 mg tablet and  - amLODipine (NORVASC) 10 MG tablet;  Take 1 tablet (10 mg total) by mouth daily.  Dispense: 90 tablet; Refill: 3  5. Depression Patient counseled - escitalopram (LEXAPRO) 10 MG tablet; Take 1 tablet (10 mg total) by mouth daily.  Dispense: 90 tablet; Refill: 3  6. Need for prophylactic vaccination and inoculation against influenza Flu shot given   Follow up in 3 months or when necessary   The patient was given clear instructions to go to ER or return to medical center if symptoms don't improve, worsen or new problems develop. The patient verbalized understanding. The patient was told to call to get lab results if they haven't heard anything in the next week.    Jeanann Lewandowsky, MD, MHA, FACP, FAAP [facility] [place], [place] [phone]   10/07/2013, 11:35 AM

## 2013-10-26 ENCOUNTER — Other Ambulatory Visit: Payer: Self-pay | Admitting: Emergency Medicine

## 2013-10-26 ENCOUNTER — Telehealth: Payer: Self-pay | Admitting: Internal Medicine

## 2013-10-26 MED ORDER — GLUCOSE BLOOD VI STRP: ORAL_STRIP | Status: DC

## 2013-10-26 MED ORDER — ACCU-CHEK AVIVA PLUS W/DEVICE KIT: 1.0000 | PACK | Freq: Two times a day (BID) | Status: DC

## 2013-10-26 NOTE — Telephone Encounter (Signed)
Pt called regarding a refill of her medication, please contact pt °

## 2013-10-27 NOTE — Telephone Encounter (Signed)
Spoke with pt 10/26/13 Medications ordered

## 2014-01-04 ENCOUNTER — Other Ambulatory Visit (HOSPITAL_COMMUNITY)
Admission: RE | Admit: 2014-01-04 | Discharge: 2014-01-04 | Disposition: A | Discharge status: Home / Self Care | Payer: Medicare Other | Source: Ambulatory Visit | Attending: Internal Medicine | Admitting: Internal Medicine

## 2014-01-04 ENCOUNTER — Ambulatory Visit: Payer: Medicare Other | Attending: Internal Medicine | Admitting: Internal Medicine

## 2014-01-04 ENCOUNTER — Encounter: Payer: Self-pay | Admitting: Internal Medicine

## 2014-01-04 VITALS — BP 124/83 | HR 69 | Temp 98.8°F | Resp 16 | Ht 66.0 in | Wt 202.0 lb

## 2014-01-04 DIAGNOSIS — Z124 Encounter for screening for malignant neoplasm of cervix: Secondary | ICD-10-CM

## 2014-01-04 DIAGNOSIS — Z944 Liver transplant status: Secondary | ICD-10-CM | POA: Insufficient documentation

## 2014-01-04 DIAGNOSIS — Z01419 Encounter for gynecological examination (general) (routine) without abnormal findings: Secondary | ICD-10-CM | POA: Insufficient documentation

## 2014-01-04 DIAGNOSIS — E119 Type 2 diabetes mellitus without complications: Secondary | ICD-10-CM

## 2014-01-04 DIAGNOSIS — Z7982 Long term (current) use of aspirin: Secondary | ICD-10-CM | POA: Insufficient documentation

## 2014-01-04 DIAGNOSIS — Z1151 Encounter for screening for human papillomavirus (HPV): Secondary | ICD-10-CM | POA: Insufficient documentation

## 2014-01-04 DIAGNOSIS — Z09 Encounter for follow-up examination after completed treatment for conditions other than malignant neoplasm: Secondary | ICD-10-CM | POA: Insufficient documentation

## 2014-01-04 DIAGNOSIS — Z794 Long term (current) use of insulin: Secondary | ICD-10-CM | POA: Insufficient documentation

## 2014-01-04 DIAGNOSIS — G894 Chronic pain syndrome: Secondary | ICD-10-CM

## 2014-01-04 DIAGNOSIS — I10 Essential (primary) hypertension: Secondary | ICD-10-CM

## 2014-01-04 LAB — GLUCOSE, POCT (MANUAL RESULT ENTRY): POC GLUCOSE: 147 mg/dL — AB (ref 70–99)

## 2014-01-04 MED ORDER — TRAMADOL HCL 50 MG PO TABS: 50.0000 mg | ORAL_TABLET | Freq: Four times a day (QID) | ORAL | Status: DC | PRN

## 2014-01-04 NOTE — Progress Notes (Signed)
Pt is here following up on her diabetes. Pt is here to get a Pap smear.

## 2014-01-04 NOTE — Patient Instructions (Signed)
Tramadol tablets What is this medicine? TRAMADOL (TRA ma dole) is a pain reliever. It is used to treat moderate to severe pain in adults. This medicine may be used for other purposes; ask your health care provider or pharmacist if you have questions. COMMON BRAND NAME(S): Ultram What should I tell my health care provider before I take this medicine? They need to know if you have any of these conditions: -brain tumor -depression -drug abuse or addiction -head injury -if you frequently drink alcohol containing drinks -kidney disease or trouble passing urine -liver disease -lung disease, asthma, or breathing problems -seizures or epilepsy -suicidal thoughts, plans, or attempt; a previous suicide attempt by you or a family member -an unusual or allergic reaction to tramadol, codeine, other medicines, foods, dyes, or preservatives -pregnant or trying to get pregnant -breast-feeding How should I use this medicine? Take this medicine by mouth with a full glass of water. Follow the directions on the prescription label. If the medicine upsets your stomach, take it with food or milk. Do not take more medicine than you are told to take. Talk to your pediatrician regarding the use of this medicine in children. Special care may be needed. Overdosage: If you think you have taken too much of this medicine contact a poison control center or emergency room at once. NOTE: This medicine is only for you. Do not share this medicine with others. What if I miss a dose? If you miss a dose, take it as soon as you can. If it is almost time for your next dose, take only that dose. Do not take double or extra doses. What may interact with this medicine? Do not take this medicine with any of the following medications: -MAOIs like Carbex, Eldepryl, Marplan, Nardil, and Parnate This medicine may also interact with the following medications: -alcohol or medicines that contain  alcohol -antihistamines -benzodiazepines -bupropion -carbamazepine or oxcarbazepine -clozapine -cyclobenzaprine -digoxin -furazolidone -linezolid -medicines for depression, anxiety, or psychotic disturbances -medicines for migraine headache like almotriptan, eletriptan, frovatriptan, naratriptan, rizatriptan, sumatriptan, zolmitriptan -medicines for pain like pentazocine, buprenorphine, butorphanol, meperidine, nalbuphine, and propoxyphene -medicines for sleep -muscle relaxants -naltrexone -phenobarbital -phenothiazines like perphenazine, thioridazine, chlorpromazine, mesoridazine, fluphenazine, prochlorperazine, promazine, and trifluoperazine -procarbazine -warfarin This list may not describe all possible interactions. Give your health care provider a list of all the medicines, herbs, non-prescription drugs, or dietary supplements you use. Also tell them if you smoke, drink alcohol, or use illegal drugs. Some items may interact with your medicine. What should I watch for while using this medicine? Tell your doctor or health care professional if your pain does not go away, if it gets worse, or if you have new or a different type of pain. You may develop tolerance to the medicine. Tolerance means that you will need a higher dose of the medicine for pain relief. Tolerance is normal and is expected if you take this medicine for a long time. Do not suddenly stop taking your medicine because you may develop a severe reaction. Your body becomes used to the medicine. This does NOT mean you are addicted. Addiction is a behavior related to getting and using a drug for a non-medical reason. If you have pain, you have a medical reason to take pain medicine. Your doctor will tell you how much medicine to take. If your doctor wants you to stop the medicine, the dose will be slowly lowered over time to avoid any side effects. You may get drowsy or dizzy. Do not drive, use  machinery, or do anything that  needs mental alertness until you know how this medicine affects you. Do not stand or sit up quickly, especially if you are an older patient. This reduces the risk of dizzy or fainting spells. Alcohol can increase or decrease the effects of this medicine. Avoid alcoholic drinks. You may have constipation. Try to have a bowel movement at least every 2 to 3 days. If you do not have a bowel movement for 3 days, call your doctor or health care professional. Your mouth may get dry. Chewing sugarless gum or sucking hard candy, and drinking plenty of water may help. Contact your doctor if the problem does not go away or is severe. What side effects may I notice from receiving this medicine? Side effects that you should report to your doctor or health care professional as soon as possible: -allergic reactions like skin rash, itching or hives, swelling of the face, lips, or tongue -breathing difficulties, wheezing -confusion -itching -light headedness or fainting spells -redness, blistering, peeling or loosening of the skin, including inside the mouth -seizures Side effects that usually do not require medical attention (report to your doctor or health care professional if they continue or are bothersome): -constipation -dizziness -drowsiness -headache -nausea, vomiting This list may not describe all possible side effects. Call your doctor for medical advice about side effects. You may report side effects to FDA at 1-800-FDA-1088. Where should I keep my medicine? Keep out of the reach of children. Store at room temperature between 15 and 30 degrees C (59 and 86 degrees F). Keep container tightly closed. Throw away any unused medicine after the expiration date. NOTE: This sheet is a summary. It may not cover all possible information. If you have questions about this medicine, talk to your doctor, pharmacist, or health care provider.  2014, Elsevier/Gold Standard. (2010-06-28 11:55:44) Hypertension As  your heart beats, it forces blood through your arteries. This force is your blood pressure. If the pressure is too high, it is called hypertension (HTN) or high blood pressure. HTN is dangerous because you may have it and not know it. High blood pressure may mean that your heart has to work harder to pump blood. Your arteries may be narrow or stiff. The extra work puts you at risk for heart disease, stroke, and other problems.  Blood pressure consists of two numbers, a higher number over a lower, 110/72, for example. It is stated as "110 over 72." The ideal is below 120 for the top number (systolic) and under 80 for the bottom (diastolic). Write down your blood pressure today. You should pay close attention to your blood pressure if you have certain conditions such as:  Heart failure.  Prior heart attack.  Diabetes  Chronic kidney disease.  Prior stroke.  Multiple risk factors for heart disease. To see if you have HTN, your blood pressure should be measured while you are seated with your arm held at the level of the heart. It should be measured at least twice. A one-time elevated blood pressure reading (especially in the Emergency Department) does not mean that you need treatment. There may be conditions in which the blood pressure is different between your right and left arms. It is important to see your caregiver soon for a recheck. Most people have essential hypertension which means that there is not a specific cause. This type of high blood pressure may be lowered by changing lifestyle factors such as:  Stress.  Smoking.  Lack of exercise.  Excessive weight.  Drug/tobacco/alcohol use.  Eating less salt. Most people do not have symptoms from high blood pressure until it has caused damage to the body. Effective treatment can often prevent, delay or reduce that damage. TREATMENT  When a cause has been identified, treatment for high blood pressure is directed at the cause. There are a  large number of medications to treat HTN. These fall into several categories, and your caregiver will help you select the medicines that are best for you. Medications may have side effects. You should review side effects with your caregiver. If your blood pressure stays high after you have made lifestyle changes or started on medicines,   Your medication(s) may need to be changed.  Other problems may need to be addressed.  Be certain you understand your prescriptions, and know how and when to take your medicine.  Be sure to follow up with your caregiver within the time frame advised (usually within two weeks) to have your blood pressure rechecked and to review your medications.  If you are taking more than one medicine to lower your blood pressure, make sure you know how and at what times they should be taken. Taking two medicines at the same time can result in blood pressure that is too low. SEEK IMMEDIATE MEDICAL CARE IF:  You develop a severe headache, blurred or changing vision, or confusion.  You have unusual weakness or numbness, or a faint feeling.  You have severe chest or abdominal pain, vomiting, or breathing problems. MAKE SURE YOU:   Understand these instructions.  Will watch your condition.  Will get help right away if you are not doing well or get worse. Document Released: 10/15/2005 Document Revised: 01/07/2012 Document Reviewed: 06/04/2008 [facility] Patient Information 2014 [place].

## 2014-01-08 NOTE — Progress Notes (Signed)
Patient ID: Kathryn Walsh, female   DOB: 28-Oct-1956, 58 y.o.   MRN: [id number]   Librada Castronovo, is a 58 y.o. female  MOQ:[id number]  PTW:[id number]  DOB - [birth year]  Chief Complaint  Patient presents with  . Follow-up        Subjective:   Kathryn Walsh is a 58 y.o. female here today for a Pap smear. 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 She has no complaint today. She follows all with her liver specialist at [facility]. She is compliant with medications. Patient has No headache, No chest pain, No abdominal pain - No Nausea, No new weakness tingling or numbness, No Cough - SOB.  No problems updated.  ALLERGIES: No Known Allergies  PAST MEDICAL HISTORY: Past Medical History  Diagnosis Date  . Diabetes mellitus without complication     MEDICATIONS AT HOME: Prior to Admission medications   Medication Sig Start Date End Date Taking? Authorizing Provider  amLODipine (NORVASC) 10 MG tablet Take 1 tablet (10 mg total) by mouth daily. 10/06/13  Yes Angelica Chessman, MD  aspirin 81 MG chewable tablet Chew 81 mg by mouth daily.   Yes Historical Provider, MD  azaTHIOprine (IMURAN) 50 MG tablet Take 200 mg by mouth daily.   Yes Historical Provider, MD  Blood Glucose Monitoring Suppl (ACCU-CHEK AVIVA PLUS) W/DEVICE KIT 1 Device by Does not apply route 2 (two) times daily. 10/26/13  Yes Angelica Chessman, MD  escitalopram (LEXAPRO) 10 MG tablet Take 1 tablet (10 mg total) by mouth daily. 10/06/13  Yes Angelica Chessman, MD  fluconazole (DIFLUCAN) 100 MG tablet Take 1 tablet (100 mg total) by mouth daily. 08/01/13  Yes Nita Sells, MD  glucose blood test strip Use as instructed 10/26/13  Yes Angelica Chessman, MD  insulin aspart (NOVOLOG) 100 UNIT/ML injection Inject 8 Units into the skin 3 (three) times daily with meals. 07/13/13  Yes Domenic Polite, MD  insulin glargine  (LANTUS) 100 UNIT/ML injection Inject 70 Units into the skin at bedtime. Per sliding scale 07/13/13  Yes Domenic Polite, MD  Multiple Vitamin (MULTIVITAMIN WITH MINERALS) TABS tablet Take 1 tablet by mouth daily.   Yes Historical Provider, MD  omeprazole (PRILOSEC) 20 MG capsule Take 20 mg by mouth daily.   Yes Historical Provider, MD  potassium chloride SA (K-DUR,KLOR-CON) 20 MEQ tablet Take 20 mEq by mouth daily.   Yes Historical Provider, MD  predniSONE (DELTASONE) 5 MG tablet Take 5 mg by mouth daily.   Yes Historical Provider, MD  propranolol (INDERAL) 20 MG tablet Take 20 mg by mouth 2 (two) times daily.   Yes Historical Provider, MD  tacrolimus (PROGRAF) 1 MG capsule Take 2 mg by mouth 2 (two) times daily.   Yes Historical Provider, MD  traMADol (ULTRAM) 50 MG tablet Take 1 tablet (50 mg total) by mouth every 6 (six) hours as needed. 01/04/14  Yes Angelica Chessman, MD  nystatin cream (MYCOSTATIN) Apply topically 2 (two) times daily. 08/06/13   Lavonia Drafts, MD  nystatin ointment (MYCOSTATIN) Apply topically 3 (three) times daily. 08/01/13   Nita Sells, MD  sulfamethoxazole-trimethoprim (BACTRIM DS) 800-160 MG per tablet Take 1 tablet by mouth 2 (two) times daily. 08/01/13   Nita Sells, MD     Objective:   Filed Vitals:   01/04/14 1111  BP: 124/83  Pulse: 69  Temp: 98.8 F (37.1 C)  TempSrc: Oral  Resp: 16  Height: '5\' 6"'  (1.676 m)  Weight: 202 lb (91.627 kg)  SpO2: 97%    Exam General appearance : Awake, alert, not in any distress. Speech Clear. Not toxic looking HEENT: Atraumatic and Normocephalic, pupils equally reactive to light and accomodation Neck: supple, no JVD. No cervical lymphadenopathy.  Chest:Good air entry bilaterally, no added sounds  CVS: S1 S2 regular, no murmurs.  Abdomen: Bowel sounds present, Non tender and not distended with no gaurding, rigidity or rebound. Extremities: B/L Lower Ext shows no edema, both legs are warm to  touch Neurology: Awake alert, and oriented X 3, CN II-XII intact, Non focal Skin:No Rash Wounds:N/A  Data Review Lab Results  Component Value Date   HGBA1C 5.9 10/06/2013   HGBA1C 11.8* 07/11/2013   HGBA1C  Value: 10.5 (NOTE)                                                                       According to the ADA Clinical Practice Recommendations for 2011, when HbA1c is used as a screening test:   >=6.5%   Diagnostic of Diabetes Mellitus           (if abnormal result  is confirmed)  5.7-6.4%   Increased risk of developing Diabetes Mellitus  References:Diagnosis and Classification of Diabetes Mellitus,Diabetes YIAX,6553,74(MOLMB 1):S62-S69 and Standards of Medical Care in         Diabetes - 2011,Diabetes Care,2011,34  (Suppl 1):S11-S61.* 05/07/2010     Assessment & Plan   1. Diabetes - Glucose (CBG) Continue present regimen of insulin  2. Pap smear for cervical cancer screening  - Cytology - PAP : sent for cytology  Breast cancer screening - MM Digital Screening; Future  3. HTN (hypertension) Continue amlodipine 10 mg tablet by mouth daily Continue propranolol 20 mg total. There  4. Chronic pain syndrome  - traMADol (ULTRAM) 50 MG tablet; Take 1 tablet (50 mg total) by mouth every 6 (six) hours as needed.  Dispense: 90 tablet; Refill: 0  Patient was counseled extensively on nutrition and exercise Patient was extensively counseled on smoking cessation  Return in about 6 months (around 07/07/2014), or if symptoms worsen or fail to improve, for Routine Follow Up, HTN/DM.  The patient was given clear instructions to go to ER or return to medical center if symptoms don't improve, worsen or new problems develop. The patient verbalized understanding. The patient was told to call to get lab results if they haven't heard anything in the next week.   This note has been created with Surveyor, quantity. Any transcriptional errors are  unintentional.    Angelica Chessman, MD, [place], [place], [place] and [place], [place]   01/08/2014, 10:08 PM

## 2014-01-11 ENCOUNTER — Telehealth: Payer: Self-pay | Admitting: Emergency Medicine

## 2014-01-11 MED ORDER — FLUCONAZOLE 150 MG PO TABS: ORAL_TABLET | ORAL | Status: DC

## 2014-01-11 NOTE — Telephone Encounter (Signed)
Pt given lab results with instructions to start taking Diflucan 150 mg tab once then repeat if sx continues  Script e-scribed to [facility] [address]

## 2014-01-11 NOTE — Telephone Encounter (Signed)
Message copied by Ricci Barker on Mon Jan 11, 2014 12:50 PM ------      Message from: Tresa Garter      Created: Sun Jan 10, 2014  9:01 PM       Please call to inform patient that her Pap smear result is negative for malignancy. However her infection with candidiasis/yeast infection which we can consider treatment with medication.      Please call in Diflucan 150 mg tablet by mouth q. 72 hours x2 doses ------

## 2014-01-12 ENCOUNTER — Telehealth: Payer: Self-pay | Admitting: Internal Medicine

## 2014-01-12 NOTE — Telephone Encounter (Signed)
Pt called regarding her medication, pt states that her pharmacy has not gotten her medication. Please contact pt

## 2014-01-13 NOTE — Telephone Encounter (Signed)
Spoke with patient and the script was called into the wrong pharmacy.  Patient will call right pharmacy and have medication transferred

## 2014-04-06 ENCOUNTER — Other Ambulatory Visit: Payer: Self-pay | Admitting: Internal Medicine

## 2014-05-21 ENCOUNTER — Other Ambulatory Visit: Payer: Self-pay | Admitting: Nurse Practitioner

## 2014-05-21 DIAGNOSIS — I739 Peripheral vascular disease, unspecified: Secondary | ICD-10-CM

## 2014-06-01 ENCOUNTER — Ambulatory Visit
Admission: RE | Admit: 2014-06-01 | Discharge: 2014-06-01 | Disposition: A | Discharge status: Home / Self Care | Payer: Medicare Other | Source: Ambulatory Visit | Attending: Nurse Practitioner | Admitting: Nurse Practitioner

## 2014-06-01 DIAGNOSIS — I739 Peripheral vascular disease, unspecified: Secondary | ICD-10-CM

## 2014-09-30 ENCOUNTER — Other Ambulatory Visit: Payer: Self-pay | Admitting: Internal Medicine

## 2014-10-09 ENCOUNTER — Emergency Department (HOSPITAL_COMMUNITY)
Admission: EM | Admit: 2014-10-09 | Discharge: 2014-10-09 | Disposition: A | Discharge status: Home / Self Care | Payer: Medicare Other | Attending: Emergency Medicine | Admitting: Emergency Medicine

## 2014-10-09 ENCOUNTER — Encounter (HOSPITAL_COMMUNITY): Payer: Self-pay | Admitting: Emergency Medicine

## 2014-10-09 DIAGNOSIS — E119 Type 2 diabetes mellitus without complications: Secondary | ICD-10-CM | POA: Diagnosis not present

## 2014-10-09 DIAGNOSIS — Z720 Tobacco use: Secondary | ICD-10-CM | POA: Insufficient documentation

## 2014-10-09 DIAGNOSIS — Z794 Long term (current) use of insulin: Secondary | ICD-10-CM | POA: Diagnosis not present

## 2014-10-09 DIAGNOSIS — Z79899 Other long term (current) drug therapy: Secondary | ICD-10-CM | POA: Insufficient documentation

## 2014-10-09 DIAGNOSIS — Z7952 Long term (current) use of systemic steroids: Secondary | ICD-10-CM | POA: Insufficient documentation

## 2014-10-09 DIAGNOSIS — L293 Anogenital pruritus, unspecified: Secondary | ICD-10-CM | POA: Diagnosis present

## 2014-10-09 DIAGNOSIS — Z792 Long term (current) use of antibiotics: Secondary | ICD-10-CM | POA: Diagnosis not present

## 2014-10-09 DIAGNOSIS — N760 Acute vaginitis: Secondary | ICD-10-CM | POA: Diagnosis not present

## 2014-10-09 DIAGNOSIS — Z7982 Long term (current) use of aspirin: Secondary | ICD-10-CM | POA: Diagnosis not present

## 2014-10-09 MED ORDER — FLUCONAZOLE 150 MG PO TABS
150.0000 mg | ORAL_TABLET | Freq: Once | ORAL | Status: AC
  Administered 2014-10-09: 150 mg via ORAL
  Filled 2014-10-09: qty 1

## 2014-10-09 MED ORDER — NYSTATIN 100000 UNIT/GM EX CREA: TOPICAL_CREAM | CUTANEOUS | Status: DC

## 2014-10-09 MED ORDER — FLUCONAZOLE 150 MG PO TABS: 150.0000 mg | ORAL_TABLET | ORAL | Status: AC

## 2014-10-09 MED ORDER — LIDOCAINE HCL 2 % EX GEL
1.0000 "application " | Freq: Once | CUTANEOUS | Status: AC
  Administered 2014-10-09: 1 via TOPICAL
  Filled 2014-10-09: qty 10

## 2014-10-09 MED ORDER — NYSTATIN 100000 UNIT/GM EX OINT: TOPICAL_OINTMENT | Freq: Three times a day (TID) | CUTANEOUS | Status: DC

## 2014-10-09 MED ORDER — HYDROXYZINE HCL 25 MG PO TABS
25.0000 mg | ORAL_TABLET | Freq: Once | ORAL | Status: AC
  Administered 2014-10-09: 25 mg via ORAL
  Filled 2014-10-09: qty 1

## 2014-10-09 NOTE — ED Provider Notes (Signed)
CSN: [id number]     Arrival date & time 10/09/14  1511 History   First MD Initiated Contact with Patient 10/09/14 1708     Chief Complaint  Patient presents with  . Vaginal Itching     Patient is a 58 y.o. female presenting with vaginal itching. The history is provided by the patient.  Vaginal Itching Pertinent negatives include no abdominal pain.   Ms. Legros presents for evaluation of vaginal itching. She states that she has been itching on her external vaginal area for the last 3 days. Symptoms are severe, constant, worsening. Denies any vaginal discharge or new sexual contacts. She states that she has scratched herself with her fingernail and has some pain in the area. She denies any fevers, vomiting, or previous similar symptoms.  She has tried applying A and D ointment without relief. She states her sugars are well controlled in the low 100s. She has a history of liver transplant and takes Prograf and prednisone chronically. She has had the same sexual partner for the last 28 years and has no history of sexually transmitted infection.  Past Medical History  Diagnosis Date  . Diabetes mellitus without complication    Past Surgical History  Procedure Laterality Date  . Liver transplantation     No family history on file. History  Substance Use Topics  . Smoking status: Current Some Day Smoker -- 0.50 packs/day for 12 years    Types: Cigarettes  . Smokeless tobacco: Not on file  . Alcohol Use: No   OB History    No data available     Review of Systems  Gastrointestinal: Negative for nausea, vomiting and abdominal pain.  Genitourinary: Negative for urgency, decreased urine volume and vaginal discharge.  All other systems reviewed and are negative.     Allergies  Review of patient's allergies indicates no known allergies.  Home Medications   Prior to Admission medications   Medication Sig Start Date End Date Taking? Authorizing Provider  amLODipine (NORVASC) 10 MG  tablet Take 1 tablet (10 mg total) by mouth daily. 10/06/13  Yes Tresa Garter, MD  aspirin 81 MG chewable tablet Chew 81 mg by mouth daily.   Yes Historical Provider, MD  azaTHIOprine (IMURAN) 50 MG tablet Take 200 mg by mouth daily.   Yes Historical Provider, MD  escitalopram (LEXAPRO) 10 MG tablet Take 1 tablet (10 mg total) by mouth daily. 10/06/13  Yes Tresa Garter, MD  insulin aspart (NOVOLOG) 100 UNIT/ML injection Inject 8 Units into the skin 3 (three) times daily with meals. Patient taking differently: Inject 8 Units into the skin daily.  07/13/13  Yes Domenic Polite, MD  insulin glargine (LANTUS) 100 UNIT/ML injection Inject 15-20 Units into the skin at bedtime. Per sliding scale 07/13/13  Yes Domenic Polite, MD  Multiple Vitamin (MULTIVITAMIN WITH MINERALS) TABS tablet Take 1 tablet by mouth daily.   Yes Historical Provider, MD  omeprazole (PRILOSEC) 20 MG capsule Take 20 mg by mouth daily.   Yes Historical Provider, MD  predniSONE (DELTASONE) 5 MG tablet Take 5 mg by mouth daily.   Yes Historical Provider, MD  propranolol (INDERAL) 20 MG tablet Take 20 mg by mouth 2 (two) times daily.   Yes Historical Provider, MD  tacrolimus (PROGRAF) 1 MG capsule Take 2 mg by mouth 2 (two) times daily.   Yes Historical Provider, MD  traMADol (ULTRAM) 50 MG tablet TAKE 1 TABLET BY MOUTH EVERY 6 HOURS AS NEEDED   Yes Olugbemiga E  Doreene Burke, MD  fluconazole (DIFLUCAN) 100 MG tablet Take 1 tablet (100 mg total) by mouth daily. Patient not taking: Reported on 10/09/2014 08/01/13   Nita Sells, MD  fluconazole (DIFLUCAN) 150 MG tablet Take 1 tablet once then repeat in 72 hrs if no improvement Patient not taking: Reported on 10/09/2014 01/11/14   Tresa Garter, MD  nystatin cream (MYCOSTATIN) Apply topically 2 (two) times daily. Patient not taking: Reported on 10/09/2014 08/06/13   Lavonia Drafts, MD  nystatin ointment (MYCOSTATIN) Apply topically 3 (three) times daily. Patient not  taking: Reported on 10/09/2014 08/01/13   Nita Sells, MD  sulfamethoxazole-trimethoprim (BACTRIM DS) 800-160 MG per tablet Take 1 tablet by mouth 2 (two) times daily. Patient not taking: Reported on 10/09/2014 08/01/13   Nita Sells, MD   BP 161/82 mmHg  Pulse 80  Temp(Src) 98.3 F (36.8 C) (Oral)  Resp 18  SpO2 99% Physical Exam  Constitutional: She is oriented to person, place, and time. She appears well-developed and well-nourished.  Mild distress  Pulmonary/Chest: Effort normal and breath sounds normal. No respiratory distress.  Abdominal: Soft. There is no tenderness. There is no rebound and no guarding.  Genitourinary:  There is no vaginal discharge. There is no induration or erythema in the labial region. There are multiple excoriations and shallow ulcerations over the labial region and introitus.   Musculoskeletal: She exhibits no tenderness.  Neurological: She is alert and oriented to person, place, and time.  Skin: Skin is warm and dry.  Nursing note and vitals reviewed.   ED Course  Procedures (including critical care time) Labs Review Labs Reviewed - No data to display  Imaging Review No results found.   EKG Interpretation None      MDM   Final diagnoses:  Acute vulvovaginitis    Pt w/ hx/o liver transplant and DM here with vaginal itching.  Clinical picture c/w vaginitis, treating for candidal source.  There is no clinical evidence of acute bacterial infection.  Pt does have excoriations and ulcerations present, which pt states are self inflicted.  D/w  Pt home care with local hygeine.  Treating with diflucan and nystatin.      Quintella Reichert, MD 10/09/14 [phone]

## 2014-10-09 NOTE — ED Notes (Signed)
Pt from home c/o vaginal itching x 3 days. She denies vaginal odor or discharge. She reports using A&D ointment.

## 2014-10-09 NOTE — ED Notes (Signed)
Pelvic exam set up at bedside.  

## 2014-10-09 NOTE — Discharge Instructions (Signed)

## 2014-10-23 ENCOUNTER — Other Ambulatory Visit: Payer: Self-pay | Admitting: Internal Medicine

## 2014-10-28 ENCOUNTER — Other Ambulatory Visit: Payer: Self-pay | Admitting: Internal Medicine

## 2015-01-04 DIAGNOSIS — E782 Mixed hyperlipidemia: Secondary | ICD-10-CM | POA: Diagnosis not present

## 2015-01-04 DIAGNOSIS — Z5181 Encounter for therapeutic drug level monitoring: Secondary | ICD-10-CM | POA: Diagnosis not present

## 2015-01-04 DIAGNOSIS — Q675 Congenital deformity of spine: Secondary | ICD-10-CM | POA: Diagnosis not present

## 2015-01-04 DIAGNOSIS — E1165 Type 2 diabetes mellitus with hyperglycemia: Secondary | ICD-10-CM | POA: Diagnosis not present

## 2015-01-04 DIAGNOSIS — M545 Low back pain: Secondary | ICD-10-CM | POA: Diagnosis not present

## 2015-01-04 DIAGNOSIS — Z79891 Long term (current) use of opiate analgesic: Secondary | ICD-10-CM | POA: Diagnosis not present

## 2015-01-04 DIAGNOSIS — I10 Essential (primary) hypertension: Secondary | ICD-10-CM | POA: Diagnosis not present

## 2015-01-04 DIAGNOSIS — Z79899 Other long term (current) drug therapy: Secondary | ICD-10-CM | POA: Diagnosis not present

## 2015-02-08 DIAGNOSIS — Z944 Liver transplant status: Secondary | ICD-10-CM | POA: Diagnosis not present

## 2015-02-08 DIAGNOSIS — K754 Autoimmune hepatitis: Secondary | ICD-10-CM | POA: Diagnosis not present

## 2015-02-08 DIAGNOSIS — Z418 Encounter for other procedures for purposes other than remedying health state: Secondary | ICD-10-CM | POA: Diagnosis not present

## 2015-03-03 DIAGNOSIS — E1165 Type 2 diabetes mellitus with hyperglycemia: Secondary | ICD-10-CM | POA: Diagnosis not present

## 2015-03-03 DIAGNOSIS — B373 Candidiasis of vulva and vagina: Secondary | ICD-10-CM | POA: Diagnosis not present

## 2015-03-24 DIAGNOSIS — R319 Hematuria, unspecified: Secondary | ICD-10-CM | POA: Diagnosis not present

## 2015-08-12 DIAGNOSIS — Z23 Encounter for immunization: Secondary | ICD-10-CM | POA: Diagnosis not present

## 2015-08-12 DIAGNOSIS — K754 Autoimmune hepatitis: Secondary | ICD-10-CM | POA: Diagnosis not present

## 2015-08-12 DIAGNOSIS — Z418 Encounter for other procedures for purposes other than remedying health state: Secondary | ICD-10-CM | POA: Diagnosis not present

## 2015-08-12 DIAGNOSIS — Z944 Liver transplant status: Secondary | ICD-10-CM | POA: Diagnosis not present

## 2015-08-26 DIAGNOSIS — Z418 Encounter for other procedures for purposes other than remedying health state: Secondary | ICD-10-CM | POA: Diagnosis not present

## 2015-08-26 DIAGNOSIS — Z944 Liver transplant status: Secondary | ICD-10-CM | POA: Diagnosis not present

## 2015-08-30 DIAGNOSIS — E1165 Type 2 diabetes mellitus with hyperglycemia: Secondary | ICD-10-CM | POA: Diagnosis not present

## 2015-08-30 DIAGNOSIS — Z794 Long term (current) use of insulin: Secondary | ICD-10-CM | POA: Diagnosis not present

## 2015-08-30 DIAGNOSIS — I10 Essential (primary) hypertension: Secondary | ICD-10-CM | POA: Diagnosis not present

## 2015-09-26 DIAGNOSIS — Z418 Encounter for other procedures for purposes other than remedying health state: Secondary | ICD-10-CM | POA: Diagnosis not present

## 2015-09-26 DIAGNOSIS — Z944 Liver transplant status: Secondary | ICD-10-CM | POA: Diagnosis not present

## 2015-10-05 DIAGNOSIS — K8689 Other specified diseases of pancreas: Secondary | ICD-10-CM | POA: Diagnosis not present

## 2015-10-05 DIAGNOSIS — Z418 Encounter for other procedures for purposes other than remedying health state: Secondary | ICD-10-CM | POA: Diagnosis not present

## 2015-10-05 DIAGNOSIS — K754 Autoimmune hepatitis: Secondary | ICD-10-CM | POA: Diagnosis not present

## 2015-10-05 DIAGNOSIS — Z944 Liver transplant status: Secondary | ICD-10-CM | POA: Diagnosis not present

## 2015-10-05 DIAGNOSIS — K732 Chronic active hepatitis, not elsewhere classified: Secondary | ICD-10-CM | POA: Diagnosis not present

## 2015-10-11 DIAGNOSIS — Z418 Encounter for other procedures for purposes other than remedying health state: Secondary | ICD-10-CM | POA: Diagnosis not present

## 2015-10-11 DIAGNOSIS — Z944 Liver transplant status: Secondary | ICD-10-CM | POA: Diagnosis not present

## 2015-10-11 DIAGNOSIS — K862 Cyst of pancreas: Secondary | ICD-10-CM | POA: Diagnosis not present

## 2015-10-21 ENCOUNTER — Other Ambulatory Visit: Payer: Self-pay | Admitting: Internal Medicine

## 2015-11-08 DIAGNOSIS — Z418 Encounter for other procedures for purposes other than remedying health state: Secondary | ICD-10-CM | POA: Diagnosis not present

## 2015-11-08 DIAGNOSIS — Z944 Liver transplant status: Secondary | ICD-10-CM | POA: Diagnosis not present

## 2015-11-16 ENCOUNTER — Other Ambulatory Visit: Payer: Self-pay | Admitting: Internal Medicine

## 2015-12-22 DIAGNOSIS — Z944 Liver transplant status: Secondary | ICD-10-CM | POA: Diagnosis not present

## 2015-12-22 DIAGNOSIS — Z418 Encounter for other procedures for purposes other than remedying health state: Secondary | ICD-10-CM | POA: Diagnosis not present

## 2016-02-09 DIAGNOSIS — Z86010 Personal history of colonic polyps: Secondary | ICD-10-CM | POA: Diagnosis not present

## 2016-02-09 DIAGNOSIS — Z79899 Other long term (current) drug therapy: Secondary | ICD-10-CM | POA: Diagnosis not present

## 2016-02-09 DIAGNOSIS — K754 Autoimmune hepatitis: Secondary | ICD-10-CM | POA: Diagnosis not present

## 2016-02-09 DIAGNOSIS — Z298 Encounter for other specified prophylactic measures: Secondary | ICD-10-CM | POA: Diagnosis not present

## 2016-02-09 DIAGNOSIS — Z944 Liver transplant status: Secondary | ICD-10-CM | POA: Diagnosis not present

## 2016-02-09 DIAGNOSIS — Z4823 Encounter for aftercare following liver transplant: Secondary | ICD-10-CM | POA: Diagnosis not present

## 2016-02-16 DIAGNOSIS — M545 Low back pain: Secondary | ICD-10-CM | POA: Diagnosis not present

## 2016-02-16 DIAGNOSIS — E782 Mixed hyperlipidemia: Secondary | ICD-10-CM | POA: Diagnosis not present

## 2016-02-16 DIAGNOSIS — E1165 Type 2 diabetes mellitus with hyperglycemia: Secondary | ICD-10-CM | POA: Diagnosis not present

## 2016-02-16 DIAGNOSIS — I10 Essential (primary) hypertension: Secondary | ICD-10-CM | POA: Diagnosis not present

## 2016-05-03 DIAGNOSIS — Z794 Long term (current) use of insulin: Secondary | ICD-10-CM | POA: Diagnosis not present

## 2016-05-03 DIAGNOSIS — R8299 Other abnormal findings in urine: Secondary | ICD-10-CM | POA: Diagnosis not present

## 2016-05-03 DIAGNOSIS — Z944 Liver transplant status: Secondary | ICD-10-CM | POA: Diagnosis not present

## 2016-05-03 DIAGNOSIS — K8689 Other specified diseases of pancreas: Secondary | ICD-10-CM | POA: Diagnosis not present

## 2016-05-03 DIAGNOSIS — K754 Autoimmune hepatitis: Secondary | ICD-10-CM | POA: Diagnosis not present

## 2016-05-03 DIAGNOSIS — E119 Type 2 diabetes mellitus without complications: Secondary | ICD-10-CM | POA: Diagnosis not present

## 2016-05-03 DIAGNOSIS — E1165 Type 2 diabetes mellitus with hyperglycemia: Secondary | ICD-10-CM | POA: Diagnosis not present

## 2016-05-03 DIAGNOSIS — R531 Weakness: Secondary | ICD-10-CM | POA: Diagnosis not present

## 2016-05-03 DIAGNOSIS — Z298 Encounter for other specified prophylactic measures: Secondary | ICD-10-CM | POA: Diagnosis not present

## 2016-05-17 DIAGNOSIS — E1165 Type 2 diabetes mellitus with hyperglycemia: Secondary | ICD-10-CM | POA: Diagnosis not present

## 2016-05-17 DIAGNOSIS — I10 Essential (primary) hypertension: Secondary | ICD-10-CM | POA: Diagnosis not present

## 2016-09-18 DIAGNOSIS — I10 Essential (primary) hypertension: Secondary | ICD-10-CM | POA: Diagnosis not present

## 2016-09-18 DIAGNOSIS — Z23 Encounter for immunization: Secondary | ICD-10-CM | POA: Diagnosis not present

## 2016-09-18 DIAGNOSIS — E1165 Type 2 diabetes mellitus with hyperglycemia: Secondary | ICD-10-CM | POA: Diagnosis not present

## 2016-09-24 ENCOUNTER — Other Ambulatory Visit: Payer: Self-pay | Admitting: Internal Medicine

## 2016-09-24 DIAGNOSIS — Z1231 Encounter for screening mammogram for malignant neoplasm of breast: Secondary | ICD-10-CM

## 2016-10-19 DIAGNOSIS — E119 Type 2 diabetes mellitus without complications: Secondary | ICD-10-CM | POA: Diagnosis not present

## 2016-10-19 DIAGNOSIS — H18413 Arcus senilis, bilateral: Secondary | ICD-10-CM | POA: Diagnosis not present

## 2016-10-25 DIAGNOSIS — Z298 Encounter for other specified prophylactic measures: Secondary | ICD-10-CM | POA: Diagnosis not present

## 2016-10-25 DIAGNOSIS — Z944 Liver transplant status: Secondary | ICD-10-CM | POA: Diagnosis not present

## 2016-10-25 DIAGNOSIS — K754 Autoimmune hepatitis: Secondary | ICD-10-CM | POA: Diagnosis not present

## 2016-10-31 ENCOUNTER — Ambulatory Visit
Admission: RE | Admit: 2016-10-31 | Discharge: 2016-10-31 | Disposition: A | Discharge status: Home / Self Care | Payer: Medicare Other | Source: Ambulatory Visit | Attending: Internal Medicine | Admitting: Internal Medicine

## 2016-10-31 DIAGNOSIS — Z1231 Encounter for screening mammogram for malignant neoplasm of breast: Secondary | ICD-10-CM

## 2016-12-03 DIAGNOSIS — E1165 Type 2 diabetes mellitus with hyperglycemia: Secondary | ICD-10-CM | POA: Diagnosis not present

## 2016-12-03 DIAGNOSIS — Z79899 Other long term (current) drug therapy: Secondary | ICD-10-CM | POA: Diagnosis not present

## 2016-12-03 DIAGNOSIS — I10 Essential (primary) hypertension: Secondary | ICD-10-CM | POA: Diagnosis not present

## 2016-12-11 DIAGNOSIS — Z298 Encounter for other specified prophylactic measures: Secondary | ICD-10-CM | POA: Diagnosis not present

## 2016-12-11 DIAGNOSIS — Z944 Liver transplant status: Secondary | ICD-10-CM | POA: Diagnosis not present

## 2016-12-11 DIAGNOSIS — K754 Autoimmune hepatitis: Secondary | ICD-10-CM | POA: Diagnosis not present

## 2016-12-27 DIAGNOSIS — K740 Hepatic fibrosis: Secondary | ICD-10-CM | POA: Diagnosis not present

## 2016-12-27 DIAGNOSIS — I10 Essential (primary) hypertension: Secondary | ICD-10-CM | POA: Diagnosis not present

## 2016-12-27 DIAGNOSIS — K754 Autoimmune hepatitis: Secondary | ICD-10-CM | POA: Diagnosis not present

## 2016-12-27 DIAGNOSIS — K7469 Other cirrhosis of liver: Secondary | ICD-10-CM | POA: Diagnosis not present

## 2016-12-27 DIAGNOSIS — T8649 Other complications of liver transplant: Secondary | ICD-10-CM | POA: Diagnosis not present

## 2016-12-27 DIAGNOSIS — R079 Chest pain, unspecified: Secondary | ICD-10-CM | POA: Diagnosis not present

## 2016-12-27 DIAGNOSIS — E1165 Type 2 diabetes mellitus with hyperglycemia: Secondary | ICD-10-CM | POA: Diagnosis not present

## 2016-12-27 DIAGNOSIS — K8689 Other specified diseases of pancreas: Secondary | ICD-10-CM | POA: Diagnosis not present

## 2016-12-27 DIAGNOSIS — D899 Disorder involving the immune mechanism, unspecified: Secondary | ICD-10-CM | POA: Diagnosis not present

## 2016-12-27 DIAGNOSIS — Z944 Liver transplant status: Secondary | ICD-10-CM | POA: Diagnosis not present

## 2017-01-28 DIAGNOSIS — E1165 Type 2 diabetes mellitus with hyperglycemia: Secondary | ICD-10-CM | POA: Diagnosis not present

## 2017-01-28 DIAGNOSIS — Z79899 Other long term (current) drug therapy: Secondary | ICD-10-CM | POA: Diagnosis not present

## 2017-01-28 DIAGNOSIS — I10 Essential (primary) hypertension: Secondary | ICD-10-CM | POA: Diagnosis not present

## 2017-01-31 ENCOUNTER — Encounter (HOSPITAL_COMMUNITY): Payer: Self-pay

## 2017-01-31 ENCOUNTER — Emergency Department (HOSPITAL_COMMUNITY)
Admission: EM | Admit: 2017-01-31 | Discharge: 2017-01-31 | Disposition: A | Discharge status: Home / Self Care | Payer: Medicare Other | Attending: Emergency Medicine | Admitting: Emergency Medicine

## 2017-01-31 DIAGNOSIS — F17210 Nicotine dependence, cigarettes, uncomplicated: Secondary | ICD-10-CM | POA: Insufficient documentation

## 2017-01-31 DIAGNOSIS — Z79899 Other long term (current) drug therapy: Secondary | ICD-10-CM | POA: Insufficient documentation

## 2017-01-31 DIAGNOSIS — E119 Type 2 diabetes mellitus without complications: Secondary | ICD-10-CM | POA: Insufficient documentation

## 2017-01-31 DIAGNOSIS — Z794 Long term (current) use of insulin: Secondary | ICD-10-CM | POA: Diagnosis not present

## 2017-01-31 DIAGNOSIS — Z7982 Long term (current) use of aspirin: Secondary | ICD-10-CM | POA: Insufficient documentation

## 2017-01-31 DIAGNOSIS — I10 Essential (primary) hypertension: Secondary | ICD-10-CM | POA: Diagnosis not present

## 2017-01-31 DIAGNOSIS — B029 Zoster without complications: Secondary | ICD-10-CM

## 2017-01-31 DIAGNOSIS — R21 Rash and other nonspecific skin eruption: Secondary | ICD-10-CM | POA: Diagnosis present

## 2017-01-31 MED ORDER — OXYCODONE-ACETAMINOPHEN 5-325 MG PO TABS: 1.0000 | ORAL_TABLET | ORAL | 0 refills | Status: DC | PRN

## 2017-01-31 MED ORDER — PREDNISONE 10 MG PO TABS: 20.0000 mg | ORAL_TABLET | Freq: Every day | ORAL | 0 refills | Status: DC

## 2017-01-31 MED ORDER — OXYCODONE-ACETAMINOPHEN 5-325 MG PO TABS
2.0000 | ORAL_TABLET | Freq: Once | ORAL | Status: AC
  Administered 2017-01-31: 2 via ORAL
  Filled 2017-01-31: qty 2

## 2017-01-31 MED ORDER — VALACYCLOVIR HCL 1 G PO TABS: 1000.0000 mg | ORAL_TABLET | Freq: Three times a day (TID) | ORAL | 0 refills | Status: AC

## 2017-01-31 NOTE — ED Notes (Signed)
ED Provider at bedside. 

## 2017-01-31 NOTE — ED Provider Notes (Signed)
[facility] DEPT Provider Note   CSN: [id number] Arrival date & time: 01/31/17  1052     History   Chief Complaint Chief Complaint  Patient presents with  . Rash    HPI Kathryn Walsh is a 62 y.o. female.  Patient presents with a rash on her right forehead for approximately 1 week. Patient is status post liver transplant in 1999 at [facility]. Rash is painful. No fever, sweats, chills, eye involvement, visual disturbance, neurological deficits. Severity of pain is moderate.      Past Medical History:  Diagnosis Date  . Diabetes mellitus without complication [facility])     Patient Active Problem List   Diagnosis Date Noted  . Pap smear for cervical cancer screening 01/04/2014  . Chronic pain syndrome 01/04/2014  . Diabetes ([place]) 10/06/2013  . Depression 10/06/2013  . Hyperglycemia 07/31/2013  . Cellulitis and abscess 07/30/2013  . Immunosuppressed status ([employer]) 07/30/2013  . HTN (hypertension) 07/22/2013  . S/P liver transplant ([place]) 07/11/2013  . HCV (hepatitis C virus) 07/11/2013  . Type II diabetes mellitus, uncontrolled ([place]) 07/11/2013    Past Surgical History:  Procedure Laterality Date  . LIVER TRANSPLANTATION      OB History    No data available       Home Medications    Prior to Admission medications   Medication Sig Start Date End Date Taking? Authorizing Provider  amLODipine (NORVASC) 10 MG tablet TAKE 1 TABLET BY MOUTH DAILY. 10/20/14   Tresa Garter, MD  aspirin 81 MG chewable tablet Chew 81 mg by mouth daily.    Historical Provider, MD  azaTHIOprine (IMURAN) 50 MG tablet Take 200 mg by mouth daily.    Historical Provider, MD  escitalopram (LEXAPRO) 10 MG tablet TAKE 1 TABLET BY MOUTH EVERY DAY 10/20/14   Tresa Garter, MD  insulin aspart (NOVOLOG) 100 UNIT/ML injection Inject 8 Units into the skin 3 (three) times daily with meals. Patient taking differently: Inject 8 Units into the skin daily.  07/13/13   Domenic Polite, MD    insulin glargine (LANTUS) 100 UNIT/ML injection Inject 15-20 Units into the skin at bedtime. Per sliding scale 07/13/13   Domenic Polite, MD  Multiple Vitamin (MULTIVITAMIN WITH MINERALS) TABS tablet Take 1 tablet by mouth daily.    Historical Provider, MD  nystatin ointment (MYCOSTATIN) Apply topically 3 (three) times daily. 10/09/14   Quintella Reichert, MD  omeprazole (PRILOSEC) 20 MG capsule Take 20 mg by mouth daily.    Historical Provider, MD  oxyCODONE-acetaminophen (PERCOCET) 5-325 MG tablet Take 1-2 tablets by mouth every 4 (four) hours as needed. 01/31/17   Nat Christen, MD  predniSONE (DELTASONE) 10 MG tablet Take 2 tablets (20 mg total) by mouth daily. 01/31/17   Nat Christen, MD  propranolol (INDERAL) 20 MG tablet Take 20 mg by mouth 2 (two) times daily.    Historical Provider, MD  tacrolimus (PROGRAF) 1 MG capsule Take 2 mg by mouth 2 (two) times daily.    Historical Provider, MD  traMADol (ULTRAM) 50 MG tablet TAKE 1 TABLET BY MOUTH EVERY 6 HOURS AS NEEDED    Tresa Garter, MD  valACYclovir (VALTREX) 1000 MG tablet Take 1 tablet (1,000 mg total) by mouth 3 (three) times daily. 01/31/17 02/14/17  Nat Christen, MD    Family History No family history on file.  Social History Social History  Substance Use Topics  . Smoking status: Current Some Day Smoker    Packs/day: 0.50    Years:  12.00    Types: Cigarettes  . Smokeless tobacco: Never Used  . Alcohol use No     Allergies   Patient has no known allergies.   Review of Systems Review of Systems  All other systems reviewed and are negative.    Physical Exam Updated Vital Signs BP (!) 139/102 (BP Location: Left Arm)   Pulse 80   Temp 98.3 F (36.8 C) (Oral)   Resp 17   Ht 5\' 7"  (1.702 m)   Wt 169 lb (76.7 kg)   SpO2 98%   BMI 26.47 kg/m   Physical Exam  Constitutional: She is oriented to person, place, and time. She appears well-developed and well-nourished.  HENT:  Head: Normocephalic and atraumatic.  Eyes:  Conjunctivae are normal.  Neck: Neck supple.  Cardiovascular: Normal rate and regular rhythm.   Pulmonary/Chest: Effort normal and breath sounds normal.  Abdominal: Soft. Bowel sounds are normal.  Musculoskeletal: Normal range of motion.  Neurological: She is alert and oriented to person, place, and time.  Skin:  Right forehead:  35 cm vesicular rash  Psychiatric: She has a normal mood and affect. Her behavior is normal.  Nursing note and vitals reviewed.    ED Treatments / Results  Labs (all labs ordered are listed, but only abnormal results are displayed) Labs Reviewed - No data to display  EKG  EKG Interpretation None       Radiology No results found.  Procedures Procedures (including critical care time)  Medications Ordered in ED Medications  oxyCODONE-acetaminophen (PERCOCET/ROXICET) 5-325 MG per tablet 2 tablet (2 tablets Oral Given 01/31/17 1316)     Initial Impression / Assessment and Plan / ED Course  I have reviewed the triage vital signs and the nursing notes.  Pertinent labs & imaging results that were available during my care of the patient were reviewed by me and considered in my medical decision making (see chart for details).     History and physical consistent with shingles. No eye involvement at this time. I discussed her care with the infectious disease consultant on call. He recommended routine treatment. Patient will follow-up with ophthalmologist or return if worse. Discharge medication valacyclovir, prednisone, Percocet.  Final Clinical Impressions(s) / ED Diagnoses   Final diagnoses:  Herpes zoster without complication    New Prescriptions Discharge Medication List as of 01/31/2017  1:45 PM    START taking these medications   Details  oxyCODONE-acetaminophen (PERCOCET) 5-325 MG tablet Take 1-2 tablets by mouth every 4 (four) hours as needed., Starting Thu 01/31/2017, Print    valACYclovir (VALTREX) 1000 MG tablet Take 1 tablet (1,000 mg  total) by mouth 3 (three) times daily., Starting Thu 01/31/2017, Until Thu 02/14/2017, Print         Nat Christen, MD 01/31/17 [phone]

## 2017-01-31 NOTE — ED Notes (Signed)
Bed: [facility] Expected date:  Expected time:  Means of arrival:  Comments: 

## 2017-01-31 NOTE — ED Triage Notes (Signed)
Pt presents with painful red, pustule, scaly lesions to the right side of her forehead that extends to her scalp under her wig and states it is spreading to her right eye, onset one week ago. She reports headache. Denies any other pain.

## 2017-01-31 NOTE — Discharge Instructions (Signed)
I spoke to an infectious disease specialist. You can be treated as an outpatient. Return if infection goes into your eye or follow-up your eye doctor tomorrow. Prescription for antiviral medication, pain medicine, prednisone.

## 2017-04-04 DIAGNOSIS — E559 Vitamin D deficiency, unspecified: Secondary | ICD-10-CM | POA: Diagnosis not present

## 2017-04-04 DIAGNOSIS — E1165 Type 2 diabetes mellitus with hyperglycemia: Secondary | ICD-10-CM | POA: Diagnosis not present

## 2017-04-04 DIAGNOSIS — I10 Essential (primary) hypertension: Secondary | ICD-10-CM | POA: Diagnosis not present

## 2017-04-04 DIAGNOSIS — Z0000 Encounter for general adult medical examination without abnormal findings: Secondary | ICD-10-CM | POA: Diagnosis not present

## 2017-04-04 DIAGNOSIS — Z1389 Encounter for screening for other disorder: Secondary | ICD-10-CM | POA: Diagnosis not present

## 2017-06-15 ENCOUNTER — Other Ambulatory Visit: Payer: Self-pay | Admitting: Internal Medicine

## 2017-07-09 DIAGNOSIS — Z944 Liver transplant status: Secondary | ICD-10-CM | POA: Diagnosis not present

## 2017-07-09 DIAGNOSIS — D899 Disorder involving the immune mechanism, unspecified: Secondary | ICD-10-CM | POA: Diagnosis not present

## 2017-07-09 DIAGNOSIS — E1165 Type 2 diabetes mellitus with hyperglycemia: Secondary | ICD-10-CM | POA: Diagnosis not present

## 2017-07-10 DIAGNOSIS — M79604 Pain in right leg: Secondary | ICD-10-CM | POA: Diagnosis not present

## 2017-07-10 DIAGNOSIS — I10 Essential (primary) hypertension: Secondary | ICD-10-CM | POA: Diagnosis not present

## 2017-07-10 DIAGNOSIS — E1165 Type 2 diabetes mellitus with hyperglycemia: Secondary | ICD-10-CM | POA: Diagnosis not present

## 2017-08-22 ENCOUNTER — Emergency Department (HOSPITAL_COMMUNITY)
Admission: EM | Admit: 2017-08-22 | Discharge: 2017-08-23 | Disposition: A | Discharge status: Home / Self Care | Payer: Medicare Other | Attending: Emergency Medicine | Admitting: Emergency Medicine

## 2017-08-22 ENCOUNTER — Encounter (HOSPITAL_COMMUNITY): Payer: Self-pay

## 2017-08-22 DIAGNOSIS — A6004 Herpesviral vulvovaginitis: Secondary | ICD-10-CM | POA: Insufficient documentation

## 2017-08-22 DIAGNOSIS — Z7982 Long term (current) use of aspirin: Secondary | ICD-10-CM | POA: Diagnosis not present

## 2017-08-22 DIAGNOSIS — Z794 Long term (current) use of insulin: Secondary | ICD-10-CM | POA: Diagnosis not present

## 2017-08-22 DIAGNOSIS — E1165 Type 2 diabetes mellitus with hyperglycemia: Secondary | ICD-10-CM | POA: Insufficient documentation

## 2017-08-22 DIAGNOSIS — B3731 Acute candidiasis of vulva and vagina: Secondary | ICD-10-CM

## 2017-08-22 DIAGNOSIS — F17210 Nicotine dependence, cigarettes, uncomplicated: Secondary | ICD-10-CM | POA: Diagnosis not present

## 2017-08-22 DIAGNOSIS — R739 Hyperglycemia, unspecified: Secondary | ICD-10-CM

## 2017-08-22 DIAGNOSIS — Z79899 Other long term (current) drug therapy: Secondary | ICD-10-CM | POA: Diagnosis not present

## 2017-08-22 DIAGNOSIS — Z944 Liver transplant status: Secondary | ICD-10-CM | POA: Diagnosis not present

## 2017-08-22 DIAGNOSIS — N898 Other specified noninflammatory disorders of vagina: Secondary | ICD-10-CM | POA: Diagnosis present

## 2017-08-22 DIAGNOSIS — B373 Candidiasis of vulva and vagina: Secondary | ICD-10-CM | POA: Diagnosis not present

## 2017-08-22 HISTORY — DX: Liver transplant status: Z94.4

## 2017-08-22 LAB — URINALYSIS, ROUTINE W REFLEX MICROSCOPIC
Bacteria, UA: NONE SEEN
Bilirubin Urine: NEGATIVE
HGB URINE DIPSTICK: NEGATIVE
Ketones, ur: NEGATIVE mg/dL
NITRITE: NEGATIVE
PROTEIN: 30 mg/dL — AB
SPECIFIC GRAVITY, URINE: 1.026 (ref 1.005–1.030)
pH: 5 (ref 5.0–8.0)

## 2017-08-22 LAB — CBC
HCT: 40 % (ref 36.0–46.0)
Hemoglobin: 13.9 g/dL (ref 12.0–15.0)
MCH: 31.2 pg (ref 26.0–34.0)
MCHC: 34.8 g/dL (ref 30.0–36.0)
MCV: 89.9 fL (ref 78.0–100.0)
Platelets: 123 10*3/uL — ABNORMAL LOW (ref 150–400)
RBC: 4.45 MIL/uL (ref 3.87–5.11)
RDW: 14.8 % (ref 11.5–15.5)
WBC: 5 10*3/uL (ref 4.0–10.5)

## 2017-08-22 LAB — CBG MONITORING, ED: Glucose-Capillary: 441 mg/dL — ABNORMAL HIGH (ref 65–99)

## 2017-08-22 LAB — BASIC METABOLIC PANEL
ANION GAP: 13 (ref 5–15)
BUN: 13 mg/dL (ref 6–20)
CO2: 24 mmol/L (ref 22–32)
CREATININE: 0.7 mg/dL (ref 0.44–1.00)
Calcium: 9 mg/dL (ref 8.9–10.3)
Chloride: 97 mmol/L — ABNORMAL LOW (ref 101–111)
GFR calc Af Amer: >60 mL/min (ref >60)
GLUCOSE: 440 mg/dL — AB (ref 65–99)
Potassium: 3.9 mmol/L (ref 3.5–5.1)
Sodium: 134 mmol/L — ABNORMAL LOW (ref 135–145)

## 2017-08-22 MED ORDER — SODIUM CHLORIDE 0.9 % IV BOLUS (SEPSIS)
1000.0000 mL | Freq: Once | INTRAVENOUS | Status: AC
  Administered 2017-08-22: 1000 mL via INTRAVENOUS

## 2017-08-22 MED ORDER — LIDOCAINE HCL 2 % EX GEL
1.0000 "application " | Freq: Once | CUTANEOUS | Status: AC
  Administered 2017-08-22: 1 via TOPICAL
  Filled 2017-08-22: qty 11

## 2017-08-22 MED ORDER — VALACYCLOVIR HCL 500 MG PO TABS
1000.0000 mg | ORAL_TABLET | Freq: Once | ORAL | Status: AC
  Administered 2017-08-22: 1000 mg via ORAL
  Filled 2017-08-22: qty 2

## 2017-08-22 MED ORDER — FLUCONAZOLE 150 MG PO TABS
150.0000 mg | ORAL_TABLET | Freq: Once | ORAL | Status: AC
  Administered 2017-08-22: 150 mg via ORAL
  Filled 2017-08-22: qty 1

## 2017-08-22 MED ORDER — NYSTATIN 100000 UNIT/GM EX POWD
Freq: Once | CUTANEOUS | Status: AC
  Administered 2017-08-22: via TOPICAL
  Filled 2017-08-22: qty 15

## 2017-08-22 MED ORDER — INSULIN ASPART 100 UNIT/ML ~~LOC~~ SOLN
10.0000 [IU] | Freq: Once | SUBCUTANEOUS | Status: AC
  Administered 2017-08-22: 10 [IU] via INTRAVENOUS
  Filled 2017-08-22: qty 1

## 2017-08-22 NOTE — ED Triage Notes (Signed)
Patient presented with CBG-441. Patient denies N/v or abdominal pain. Patient also c/o vaginal itching and states she has scratched the area and is now raw.

## 2017-08-22 NOTE — ED Notes (Signed)
Pt is dressed out and has been wanded by security.

## 2017-08-22 NOTE — ED Provider Notes (Signed)
TIME SEEN: 11:17 PM  CHIEF COMPLAINT: Hyperglycemia, vaginal itching  HPI: Patient is a 61 year old female with history of insulin-dependent diabetes, liver transplant in 1999 at [facility], vaginal herpes who presents to the emergency department with hyperglycemia.  Reports several days of vaginal itching and white discharge.  Feels similar to previous yeast infections.  States she has scratched herself "raw".  No fevers.  No vomiting or diarrhea.  States she is on sliding scale Humalog in the day and Lantus 32 units at night.  She is on Prograf and Imuran as immunosuppressants.  No longer on prednisone.  ROS: See HPI Constitutional: no fever  Eyes: no drainage  ENT: no runny nose   Cardiovascular:  no chest pain  Resp: no SOB  GI: no vomiting GU: no dysuria Integumentary: no rash  Allergy: no hives  Musculoskeletal: no leg swelling  Neurological: no slurred speech ROS otherwise negative  PAST MEDICAL HISTORY/PAST SURGICAL HISTORY:  Past Medical History:  Diagnosis Date  . Diabetes mellitus without complication ([place])   . Liver transplant recipient [facility])     MEDICATIONS:  Prior to Admission medications   Medication Sig Start Date End Date Taking? Authorizing Provider  amLODipine (NORVASC) 10 MG tablet TAKE 1 TABLET BY MOUTH DAILY. Patient taking differently: TAKE 5 MG TABLET BY MOUTH DAILY. 10/20/14  Yes Tresa Garter, MD  aspirin 81 MG chewable tablet Chew 81 mg by mouth daily.   Yes [provider]  azaTHIOprine (IMURAN) 50 MG tablet Take 100 mg by mouth 2 (two) times daily.    Yes [provider]  insulin glargine (LANTUS) 100 UNIT/ML injection Inject 32 Units into the skin at bedtime. Per sliding scale  07/13/13  Yes Domenic Polite, MD  insulin lispro (HUMALOG KWIKPEN) 100 UNIT/ML KiwkPen Inject 0-15 Units into the skin 3 (three) times daily as needed (high blood sugar).   Yes [provider]  Multiple Vitamin (MULTIVITAMIN WITH MINERALS)  TABS tablet Take 1 tablet by mouth daily.   Yes [provider]  propranolol (INDERAL) 20 MG tablet Take 20 mg by mouth 2 (two) times daily.   Yes [provider]  tacrolimus (PROGRAF) 1 MG capsule Take 2 mg in am and 3 mg in pm 11/19/16  Yes [provider]  vitamin B-12 (CYANOCOBALAMIN) 1000 MCG tablet Take 1,000 mcg by mouth daily.    Yes [provider]  escitalopram (LEXAPRO) 10 MG tablet TAKE 1 TABLET BY MOUTH EVERY DAY Patient not taking: Reported on 08/22/2017 10/20/14   Tresa Garter, MD  insulin aspart (NOVOLOG) 100 UNIT/ML injection Inject 8 Units into the skin 3 (three) times daily with meals. Patient not taking: Reported on 08/22/2017 07/13/13   Domenic Polite, MD  nystatin ointment (MYCOSTATIN) Apply topically 3 (three) times daily. Patient not taking: Reported on 08/22/2017 10/09/14   Quintella Reichert, MD  oxyCODONE-acetaminophen (PERCOCET) 5-325 MG tablet Take 1-2 tablets by mouth every 4 (four) hours as needed. Patient not taking: Reported on 08/22/2017 01/31/17   Nat Christen, MD  predniSONE (DELTASONE) 10 MG tablet Take 2 tablets (20 mg total) by mouth daily. Patient not taking: Reported on 08/22/2017 01/31/17   Nat Christen, MD  traMADol (ULTRAM) 50 MG tablet TAKE 1 TABLET BY MOUTH EVERY 6 HOURS AS NEEDED Patient not taking: Reported on 08/22/2017    Tresa Garter, MD  Vitamin D, Ergocalciferol, (DRISDOL) 50000 units CAPS capsule TAKE 1 CAPSULE BY ORAL ROUTE ON TUESDAYS AND FRIDAYS 08/12/17   [provider]  ALLERGIES:  No Known Allergies  SOCIAL HISTORY:  Social History  Substance Use Topics  . Smoking status: Current Some Day Smoker    Packs/day: 0.50    Years: 12.00    Types: Cigarettes  . Smokeless tobacco: Never Used  . Alcohol use No    FAMILY HISTORY: No family history on file.  EXAM: BP (!) 147/121 (BP Location: Left Arm)   Pulse 85   Temp 98.1 F (36.7 C) (Oral)   Resp 18   Ht 5\' 6"  (1.676 m)    Wt 73.5 kg (162 lb)   SpO2 100%   BMI 26.15 kg/m  CONSTITUTIONAL: Alert and oriented and responds appropriately to questions. well-nourished, chronically ill-appearing HEAD: Normocephalic EYES: Conjunctivae clear, pupils appear equal, EOMI ENT: normal nose; moist mucous membranes NECK: Supple, no meningismus, no nuchal rigidity, no LAD  CARD: RRR; S1 and S2 appreciated; no murmurs, no clicks, no rubs, no gallops RESP: Normal chest excursion without splinting or tachypnea; breath sounds clear and equal bilaterally; no wheezes, no rhonchi, no rales, no hypoxia or respiratory distress, speaking full sentences ABD/GI: Normal bowel sounds; non-distended; soft, non-tender, no rebound, no guarding, no peritoneal signs, no hepatosplenomegaly GU: Patient has ulcerated lesions to her labia majora and minora consistent with herpes and swelling to the labia majora with excoriations and a white thick discharge consistent with yeast vaginitis, no vaginal bleeding BACK:  The back appears normal and is non-tender to palpation, there is no CVA tenderness EXT: Normal ROM in all joints; non-tender to palpation; no edema; normal capillary refill; no cyanosis, no calf tenderness or swelling    SKIN: Normal color for age and race; warm; no rash NEURO: Moves all extremities equally PSYCH: The patient's mood and manner are appropriate. Grooming and personal hygiene are appropriate.  MEDICAL DECISION MAKING: Patient here with hyperglycemia.  Labs and urine obtained show no sign of DKA.  Will give IV fluids, IV insulin.  She also appears to have yeast vaginitis as well as vaginal herpes lesions.  She states that they have given her something to "numb" this area in the past.  It appears she has been given nystatin and lidocaine which has helped her significantly.  We will give her these medications from the emergency department.  We will also place her on Valtrex and Diflucan.  ED PROGRESS: Patient reports that she is  feeling better.  Blood glucose is improved to 346 after 2 liters of IV fluids and 10 units of IV insulin.  Will give second 10 units of IV insulin the patient is requesting discharge home and I feel this is reasonable.  She is not indeed.  She will monitor her blood glucose closely and follow-up with her primary care doctor.  She has follow-up with her liver transplant doctor at [facility] on Monday.  Will discharge with Lidocaine jelly, Valtrex and Diflucan.  She has nystatin for home.   At this time, I do not feel there is any life-threatening condition present. I have reviewed and discussed all results (EKG, imaging, lab, urine as appropriate) and exam findings with patient/family. I have reviewed nursing notes and appropriate previous records.  I feel the patient is safe to be discharged home without further emergent workup and can continue workup as an outpatient as needed. Discussed usual and customary return precautions. Patient/family verbalize understanding and are comfortable with this plan.  Outpatient follow-up has been provided if needed. All questions have been answered.      Ward, Delice Bison, DO  08/23/17 0301  

## 2017-08-23 LAB — CBG MONITORING, ED
GLUCOSE-CAPILLARY: 361 mg/dL — AB (ref 65–99)
Glucose-Capillary: 346 mg/dL — ABNORMAL HIGH (ref 65–99)
Glucose-Capillary: 481 mg/dL — ABNORMAL HIGH (ref 65–99)

## 2017-08-23 MED ORDER — FLUCONAZOLE 150 MG PO TABS: 150.0000 mg | ORAL_TABLET | Freq: Once | ORAL | 0 refills | Status: AC

## 2017-08-23 MED ORDER — VALACYCLOVIR HCL 1 G PO TABS: 1000.0000 mg | ORAL_TABLET | Freq: Two times a day (BID) | ORAL | 0 refills | Status: DC

## 2017-08-23 MED ORDER — INSULIN ASPART 100 UNIT/ML ~~LOC~~ SOLN
10.0000 [IU] | Freq: Once | SUBCUTANEOUS | Status: AC
  Administered 2017-08-23: 10 [IU] via INTRAVENOUS
  Filled 2017-08-23: qty 1

## 2017-08-23 MED ORDER — SODIUM CHLORIDE 0.9 % IV BOLUS (SEPSIS)
1000.0000 mL | Freq: Once | INTRAVENOUS | Status: AC
  Administered 2017-08-23: 1000 mL via INTRAVENOUS

## 2017-08-23 MED ORDER — LIDOCAINE HCL 2 % EX GEL: 1.0000 "application " | Freq: Four times a day (QID) | CUTANEOUS | 0 refills | Status: DC | PRN

## 2017-12-10 ENCOUNTER — Other Ambulatory Visit: Payer: Self-pay | Admitting: Internal Medicine

## 2017-12-10 DIAGNOSIS — Z139 Encounter for screening, unspecified: Secondary | ICD-10-CM

## 2017-12-23 DIAGNOSIS — E1165 Type 2 diabetes mellitus with hyperglycemia: Secondary | ICD-10-CM | POA: Diagnosis not present

## 2017-12-23 DIAGNOSIS — Z79899 Other long term (current) drug therapy: Secondary | ICD-10-CM | POA: Diagnosis not present

## 2017-12-23 DIAGNOSIS — Z944 Liver transplant status: Secondary | ICD-10-CM | POA: Diagnosis not present

## 2017-12-23 DIAGNOSIS — I10 Essential (primary) hypertension: Secondary | ICD-10-CM | POA: Diagnosis not present

## 2017-12-27 ENCOUNTER — Ambulatory Visit: Payer: Medicare Other

## 2017-12-27 ENCOUNTER — Ambulatory Visit
Admission: RE | Admit: 2017-12-27 | Discharge: 2017-12-27 | Disposition: A | Discharge status: Home / Self Care | Payer: Medicare Other | Source: Ambulatory Visit | Attending: Internal Medicine | Admitting: Internal Medicine

## 2017-12-27 DIAGNOSIS — Z139 Encounter for screening, unspecified: Secondary | ICD-10-CM

## 2017-12-27 DIAGNOSIS — Z1231 Encounter for screening mammogram for malignant neoplasm of breast: Secondary | ICD-10-CM | POA: Diagnosis not present

## 2018-01-08 DIAGNOSIS — Z944 Liver transplant status: Secondary | ICD-10-CM | POA: Diagnosis not present

## 2018-01-08 DIAGNOSIS — D899 Disorder involving the immune mechanism, unspecified: Secondary | ICD-10-CM | POA: Diagnosis not present

## 2018-02-10 DIAGNOSIS — D899 Disorder involving the immune mechanism, unspecified: Secondary | ICD-10-CM | POA: Diagnosis not present

## 2018-02-10 DIAGNOSIS — Z944 Liver transplant status: Secondary | ICD-10-CM | POA: Diagnosis not present

## 2018-02-20 DIAGNOSIS — E119 Type 2 diabetes mellitus without complications: Secondary | ICD-10-CM | POA: Diagnosis not present

## 2018-03-26 DIAGNOSIS — E559 Vitamin D deficiency, unspecified: Secondary | ICD-10-CM | POA: Diagnosis not present

## 2018-03-26 DIAGNOSIS — M549 Dorsalgia, unspecified: Secondary | ICD-10-CM | POA: Diagnosis not present

## 2018-03-26 DIAGNOSIS — I10 Essential (primary) hypertension: Secondary | ICD-10-CM | POA: Diagnosis not present

## 2018-03-26 DIAGNOSIS — E1165 Type 2 diabetes mellitus with hyperglycemia: Secondary | ICD-10-CM | POA: Diagnosis not present

## 2018-04-01 DIAGNOSIS — K754 Autoimmune hepatitis: Secondary | ICD-10-CM | POA: Diagnosis not present

## 2018-04-01 DIAGNOSIS — Z944 Liver transplant status: Secondary | ICD-10-CM | POA: Diagnosis not present

## 2018-04-01 DIAGNOSIS — D899 Disorder involving the immune mechanism, unspecified: Secondary | ICD-10-CM | POA: Diagnosis not present

## 2018-04-07 DIAGNOSIS — Z1212 Encounter for screening for malignant neoplasm of rectum: Secondary | ICD-10-CM | POA: Diagnosis not present

## 2018-04-07 DIAGNOSIS — Z1211 Encounter for screening for malignant neoplasm of colon: Secondary | ICD-10-CM | POA: Diagnosis not present

## 2018-04-08 LAB — COLOGUARD: Cologuard: NEGATIVE

## 2018-07-10 ENCOUNTER — Other Ambulatory Visit: Payer: Self-pay | Admitting: Internal Medicine

## 2018-07-10 DIAGNOSIS — E2839 Other primary ovarian failure: Secondary | ICD-10-CM

## 2018-08-01 ENCOUNTER — Other Ambulatory Visit: Payer: Self-pay | Admitting: Internal Medicine

## 2018-08-15 ENCOUNTER — Other Ambulatory Visit: Payer: Self-pay | Admitting: Internal Medicine

## 2018-09-15 ENCOUNTER — Other Ambulatory Visit: Payer: Self-pay | Admitting: Internal Medicine

## 2018-09-29 ENCOUNTER — Other Ambulatory Visit: Payer: Self-pay | Admitting: Internal Medicine

## 2018-10-06 ENCOUNTER — Ambulatory Visit (INDEPENDENT_AMBULATORY_CARE_PROVIDER_SITE_OTHER): Payer: Medicare Other | Admitting: Internal Medicine

## 2018-10-06 ENCOUNTER — Encounter: Payer: Self-pay | Admitting: Internal Medicine

## 2018-10-06 VITALS — BP 116/78 | HR 72 | Temp 98.2°F | Ht 66.0 in | Wt 173.4 lb

## 2018-10-06 DIAGNOSIS — Z7982 Long term (current) use of aspirin: Secondary | ICD-10-CM

## 2018-10-06 DIAGNOSIS — E1165 Type 2 diabetes mellitus with hyperglycemia: Secondary | ICD-10-CM

## 2018-10-06 DIAGNOSIS — F17210 Nicotine dependence, cigarettes, uncomplicated: Secondary | ICD-10-CM | POA: Diagnosis not present

## 2018-10-06 DIAGNOSIS — F17200 Nicotine dependence, unspecified, uncomplicated: Secondary | ICD-10-CM

## 2018-10-06 DIAGNOSIS — I10 Essential (primary) hypertension: Secondary | ICD-10-CM | POA: Diagnosis not present

## 2018-10-06 NOTE — Progress Notes (Addendum)
Subjective:     Patient ID: Kathryn Walsh , female    DOB: 01/23/1956 , 62 y.o.   MRN: [id number]   Chief Complaint  Patient presents with  . Diabetes  . Hypertension    HPI  Diabetes  She presents for her follow-up diabetic visit. She has type 2 diabetes mellitus. Her disease course has been improving. There are no hypoglycemic associated symptoms. Pertinent negatives for hypoglycemia include no headaches. There are no diabetic associated symptoms. Pertinent negatives for diabetes include no blurred vision and no chest pain. There are no hypoglycemic complications. Risk factors for coronary artery disease include diabetes mellitus, dyslipidemia, hypertension, sedentary lifestyle and post-menopausal. She is compliant with treatment some of the time.  Hypertension  This is a chronic problem. The current episode started more than 1 year ago. The problem has been gradually improving since onset. The problem is controlled. Pertinent negatives include no blurred vision, chest pain, headaches, palpitations or shortness of breath.   Unfortunately, she had a call from her ex on the way here. She also smoked a cigarette after the call. Something she has not done in years.   Past Medical History:  Diagnosis Date  . Diabetes mellitus without complication (HCC)   . Liver transplant recipient (HCC)      Family History  Problem Relation Age of Onset  . Healthy Mother   . Healthy Father      Current Outpatient Medications:  .  amLODipine (NORVASC) 10 MG tablet, TAKE 1 TABLET BY MOUTH DAILY. (Patient taking differently: TAKE 5 MG TABLET BY MOUTH DAILY.), Disp: 90 tablet, Rfl: 3 .  aspirin 81 MG chewable tablet, Chew 81 mg by mouth daily., Disp: , Rfl:  .  azaTHIOprine (IMURAN) 50 MG tablet, Take 100 mg by mouth 2 (two) times daily. , Disp: , Rfl:  .  insulin glargine (LANTUS) 100 UNIT/ML injection, Inject 32 Units into the skin at bedtime. Per sliding scale , Disp: , Rfl:  .  insulin lispro  (HUMALOG KWIKPEN) 100 UNIT/ML KiwkPen, Inject 0-15 Units into the skin 3 (three) times daily as needed (high blood sugar)., Disp: , Rfl:  .  Multiple Vitamin (MULTIVITAMIN WITH MINERALS) TABS tablet, Take 1 tablet by mouth daily., Disp: , Rfl:  .  propranolol (INDERAL) 20 MG tablet, Take 20 mg by mouth 2 (two) times daily., Disp: , Rfl:  .  tacrolimus (PROGRAF) 1 MG capsule, Take 2 mg in am and 3 mg in pm, Disp: , Rfl:  .  Vitamin D, Ergocalciferol, (DRISDOL) 50000 units CAPS capsule, TAKE ONE CAPSULE BY MOUTH ON TUESDAYS AND FRIDAYS, Disp: 24 capsule, Rfl: 0 .  vitamin B-12 (CYANOCOBALAMIN) 1000 MCG tablet, Take 1,000 mcg by mouth daily. , Disp: , Rfl:    No Known Allergies   Review of Systems  Constitutional: Negative.   Eyes: Negative for blurred vision.  Respiratory: Negative.  Negative for shortness of breath.   Cardiovascular: Negative.  Negative for chest pain and palpitations.  Gastrointestinal: Negative.   Neurological: Negative.  Negative for headaches.  Psychiatric/Behavioral: Negative.      Today's Vitals   10/06/18 1148  BP: 116/78  Pulse: 72  Temp: 98.2 F (36.8 C)  TempSrc: Oral  Weight: 173 lb 6.4 oz (78.7 kg)  Height: 5' 6" (1.676 m)   Body mass index is 27.99 kg/m.   Objective:  Physical Exam  Constitutional: She is oriented to person, place, and time. She appears well-developed and well-nourished.  HENT:  Head: Normocephalic and atraumatic.    Eyes: EOM are normal.  Cardiovascular: Normal rate, regular rhythm and normal heart sounds.  Pulmonary/Chest: Effort normal and breath sounds normal.  Neurological: She is alert and oriented to person, place, and time.  Psychiatric: She has a normal mood and affect.  Nursing note and vitals reviewed.       Assessment And Plan:     1. Uncontrolled type 2 diabetes mellitus with hyperglycemia (HCC)  I will check labs as listed below. Importance of medication compliance was discussed with the patient. She is  encouraged to avoid sugary beverages and processed foods.   - CMP14+EGFR - Hemoglobin A1c  2. Essential hypertension, benign  Well controlled. She will continue with current meds for now. She is advised to avoid smoking cigarettes prior to her appts. She is encouraged to throw out remainder of her cigs. She will rto in 3 months for re-evaluation.   3. Cigarette nicotine dependence without complication  Importance of smoking cessation was discussed with the patient. She was congratulated for being successful all these years, and encouraged to not pick up this habit again. WE discussed this for greater than 5 minutes. She does not wish to start any medication at this time.     N , MD  

## 2018-10-06 NOTE — Patient Instructions (Signed)

## 2018-10-07 LAB — CMP14+EGFR
ALBUMIN: 4 g/dL (ref 3.6–4.8)
ALT: 9 IU/L (ref 0–32)
AST: 16 IU/L (ref 0–40)
Albumin/Globulin Ratio: 1.4 (ref 1.2–2.2)
Alkaline Phosphatase: 202 IU/L — ABNORMAL HIGH (ref 39–117)
BUN / CREAT RATIO: 19 (ref 12–28)
BUN: 13 mg/dL (ref 8–27)
Bilirubin Total: 0.4 mg/dL (ref 0.0–1.2)
CO2: 24 mmol/L (ref 20–29)
CREATININE: 0.67 mg/dL (ref 0.57–1.00)
Calcium: 9.1 mg/dL (ref 8.7–10.3)
Chloride: 98 mmol/L (ref 96–106)
GFR calc non Af Amer: 95 mL/min/{1.73_m2} (ref >59)
GFR, EST AFRICAN AMERICAN: 109 mL/min/{1.73_m2} (ref >59)
GLUCOSE: 194 mg/dL — AB (ref 65–99)
Globulin, Total: 2.8 g/dL (ref 1.5–4.5)
Potassium: 4.3 mmol/L (ref 3.5–5.2)
Sodium: 137 mmol/L (ref 134–144)
TOTAL PROTEIN: 6.8 g/dL (ref 6.0–8.5)

## 2018-10-07 LAB — HEMOGLOBIN A1C
Est. average glucose Bld gHb Est-mCnc: 212 mg/dL
Hgb A1c MFr Bld: 9 % — ABNORMAL HIGH (ref 4.8–5.6)

## 2018-10-08 NOTE — Progress Notes (Signed)
Your kidney fxn is nl. Liver fxn is nl. Your hba1c is 9.0. This is too high. Our goal is less than 7.9. pls increase exercise. Get back on eating plan.

## 2018-10-15 ENCOUNTER — Other Ambulatory Visit: Payer: Self-pay

## 2018-10-21 ENCOUNTER — Other Ambulatory Visit: Payer: Self-pay | Admitting: Internal Medicine

## 2018-12-08 ENCOUNTER — Ambulatory Visit (INDEPENDENT_AMBULATORY_CARE_PROVIDER_SITE_OTHER): Payer: Medicare Other | Admitting: Internal Medicine

## 2018-12-08 ENCOUNTER — Encounter: Payer: Self-pay | Admitting: Internal Medicine

## 2018-12-08 VITALS — BP 126/82 | HR 81 | Temp 98.0°F | Ht 66.4 in | Wt 168.8 lb

## 2018-12-08 DIAGNOSIS — Z944 Liver transplant status: Secondary | ICD-10-CM

## 2018-12-08 DIAGNOSIS — E1165 Type 2 diabetes mellitus with hyperglycemia: Secondary | ICD-10-CM | POA: Diagnosis not present

## 2018-12-08 DIAGNOSIS — F17210 Nicotine dependence, cigarettes, uncomplicated: Secondary | ICD-10-CM | POA: Diagnosis not present

## 2018-12-08 DIAGNOSIS — I10 Essential (primary) hypertension: Secondary | ICD-10-CM

## 2018-12-08 LAB — POCT URINALYSIS DIPSTICK
GLUCOSE UA: POSITIVE — AB
Ketones, UA: NEGATIVE
Leukocytes, UA: NEGATIVE
NITRITE UA: NEGATIVE
PROTEIN UA: POSITIVE — AB
Urobilinogen, UA: 1 E.U./dL
pH, UA: 5.5 (ref 5.0–8.0)

## 2018-12-08 LAB — POCT UA - MICROALBUMIN
Creatinine, POC: 200 mg/dL
Microalbumin Ur, POC: 150 mg/L

## 2018-12-08 NOTE — Progress Notes (Signed)
Subjective:     Patient ID: Kathryn Walsh , female    DOB: [birth year] , 63 y.o.   MRN: [id number]   Chief Complaint  Patient presents with  . Diabetes  . Hypertension    HPI  She is here today for diabetes check.  She reports her sugars have been high.  Her son has moved in with her. He buys a lot of bad foods, including bread. She reports she is not usually a bread eater. She is not eating her usual diet.   Diabetes  She presents for her follow-up diabetic visit. She has type 2 diabetes mellitus. Her disease course has been worsening. There are no hypoglycemic associated symptoms. Associated symptoms include weight loss. Pertinent negatives for diabetes include no blurred vision and no chest pain. There are no hypoglycemic complications. Risk factors for coronary artery disease include diabetes mellitus, dyslipidemia, hypertension and sedentary lifestyle. Her weight is decreasing steadily. Her breakfast blood glucose is taken between 9-10 am. Her breakfast blood glucose range is generally 140-180 mg/dl.  Hypertension  This is a chronic problem. The current episode started more than 1 year ago. The problem has been gradually improving since onset. The problem is controlled. Pertinent negatives include no blurred vision, chest pain, palpitations or shortness of breath.   She reports compliance with meds.   Past Medical History:  Diagnosis Date  . Diabetes mellitus without complication ([place])   . High cholesterol   . HTN (hypertension)   . Liver transplant recipient [facility])      Family History  Problem Relation Age of Onset  . Healthy Mother   . Healthy Father      Current Outpatient Medications:  .  amLODipine (NORVASC) 10 MG tablet, TAKE 1 TABLET BY MOUTH DAILY. (Patient taking differently: TAKE 5 MG TABLET BY MOUTH DAILY.), Disp: 90 tablet, Rfl: 3 .  aspirin 81 MG chewable tablet, Chew 81 mg by mouth daily., Disp: , Rfl:  .  azaTHIOprine (IMURAN) 50 MG tablet, Take 100 mg by  mouth 2 (two) times daily. , Disp: , Rfl:  .  Cholecalciferol (VITAMIN D HIGH POTENCY) 25 MCG (1000 UT) capsule, Take 4,000 Units by mouth daily., Disp: , Rfl:  .  insulin lispro (HUMALOG KWIKPEN) 100 UNIT/ML KiwkPen, Inject 0-15 Units into the skin 3 (three) times daily as needed (high blood sugar)., Disp: , Rfl:  .  Multiple Vitamin (MULTIVITAMIN WITH MINERALS) TABS tablet, Take 1 tablet by mouth daily., Disp: , Rfl:  .  propranolol (INDERAL) 20 MG tablet, Take 20 mg by mouth 2 (two) times daily., Disp: , Rfl:  .  tacrolimus (PROGRAF) 1 MG capsule, Take 2 mg in am and 3 mg in pm, Disp: , Rfl:  .  Insulin Glargine, 2 Unit Dial, (TOUJEO MAX SOLOSTAR) 300 UNIT/ML SOPN, INJECT 20 UNITS INTO THE SKIN AT BEDTIME PER INSULIN PROTOCOL (Patient taking differently: 32 Units. ), Disp: 9 pen, Rfl: 7   No Known Allergies   Review of Systems  Constitutional: Positive for weight loss.  Eyes: Negative for blurred vision.  Respiratory: Negative.  Negative for shortness of breath.   Cardiovascular: Negative.  Negative for chest pain and palpitations.  Gastrointestinal: Negative.   Neurological: Negative.   Psychiatric/Behavioral: Negative.      Today's Vitals   12/08/18 1020  BP: 126/82  Pulse: 81  Temp: 98 F (36.7 C)  TempSrc: Oral  Weight: 168 lb 12.8 oz (76.6 kg)  Height: 5' 6.4" (1.687 m)   Body mass index  is 26.92 kg/m.   Objective:  Physical Exam Vitals signs and nursing note reviewed.  Constitutional:      Appearance: Normal appearance.  HENT:     Head: Normocephalic and atraumatic.  Cardiovascular:     Rate and Rhythm: Normal rate and regular rhythm.     Heart sounds: Normal heart sounds.  Pulmonary:     Effort: Pulmonary effort is normal.     Breath sounds: Normal breath sounds.  Skin:    General: Skin is warm.  Neurological:     General: No focal deficit present.     Mental Status: She is alert.  Psychiatric:        Mood and Affect: Mood normal.         Assessment  And Plan:     1. Uncontrolled type 2 diabetes mellitus with hyperglycemia ([place])  I will check labs as listed below.  Importance of dietary compliance was discussed with the patient.  She is encouraged to remove bread from her diet.  Pt reminded that every day her sugars are elevated, this affects her eyes, brain, heart and kidneys. She promises to try to do better.   - POCT Urinalysis Dipstick (81002) - POCT UA - Microalbumin - BMP8+EGFR - Hemoglobin A1c  2. Essential hypertension  Well controlled.  She will continue with current meds. She is encouraged to avoid adding salt to her foods.   3. Cigarette nicotine dependence without complication  She is currently smoking 1/2ppd. She has been smoking since age 45. She reports she plans to start using her Nicotine patches. Importance of smoking cessation was discussed with the patient.    4. S/P liver transplant (HCC)    Maximino Greenland, MD

## 2018-12-08 NOTE — Patient Instructions (Signed)
Diabetes Mellitus and Nutrition, Adult  When you have diabetes (diabetes mellitus), it is very important to have healthy eating habits because your blood sugar (glucose) levels are greatly affected by what you eat and drink. Eating healthy foods in the appropriate amounts, at about the same times every day, can help you:  · Control your blood glucose.  · Lower your risk of heart disease.  · Improve your blood pressure.  · Reach or maintain a healthy weight.  Every person with diabetes is different, and each person has different needs for a meal plan. Your health care provider may recommend that you work with a diet and nutrition specialist (dietitian) to make a meal plan that is best for you. Your meal plan may vary depending on factors such as:  · The calories you need.  · The medicines you take.  · Your weight.  · Your blood glucose, blood pressure, and cholesterol levels.  · Your activity level.  · Other health conditions you have, such as heart or kidney disease.  How do carbohydrates affect me?  Carbohydrates, also called carbs, affect your blood glucose level more than any other type of food. Eating carbs naturally raises the amount of glucose in your blood. Carb counting is a method for keeping track of how many carbs you eat. Counting carbs is important to keep your blood glucose at a healthy level, especially if you use insulin or take certain oral diabetes medicines.  It is important to know how many carbs you can safely have in each meal. This is different for every person. Your dietitian can help you calculate how many carbs you should have at each meal and for each snack.  Foods that contain carbs include:  · Bread, cereal, rice, pasta, and crackers.  · Potatoes and corn.  · Peas, beans, and lentils.  · Milk and yogurt.  · Fruit and juice.  · Desserts, such as cakes, cookies, ice cream, and candy.  How does alcohol affect me?  Alcohol can cause a sudden decrease in blood glucose (hypoglycemia),  especially if you use insulin or take certain oral diabetes medicines. Hypoglycemia can be a life-threatening condition. Symptoms of hypoglycemia (sleepiness, dizziness, and confusion) are similar to symptoms of having too much alcohol.  If your health care provider says that alcohol is safe for you, follow these guidelines:  · Limit alcohol intake to no more than 1 drink per day for nonpregnant women and 2 drinks per day for men. One drink equals 12 oz of beer, 5 oz of wine, or 1½ oz of hard liquor.  · Do not drink on an empty stomach.  · Keep yourself hydrated with water, diet soda, or unsweetened iced tea.  · Keep in mind that regular soda, juice, and other mixers may contain a lot of sugar and must be counted as carbs.  What are tips for following this plan?    Reading food labels  · Start by checking the serving size on the "Nutrition Facts" label of packaged foods and drinks. The amount of calories, carbs, fats, and other nutrients listed on the label is based on one serving of the item. Many items contain more than one serving per package.  · Check the total grams (g) of carbs in one serving. You can calculate the number of servings of carbs in one serving by dividing the total carbs by 15. For example, if a food has 30 g of total carbs, it would be equal to 2   servings of carbs.  · Check the number of grams (g) of saturated and trans fats in one serving. Choose foods that have low or no amount of these fats.  · Check the number of milligrams (mg) of salt (sodium) in one serving. Most people should limit total sodium intake to less than 2,300 mg per day.  · Always check the nutrition information of foods labeled as "low-fat" or "nonfat". These foods may be higher in added sugar or refined carbs and should be avoided.  · Talk to your dietitian to identify your daily goals for nutrients listed on the label.  Shopping  · Avoid buying canned, premade, or processed foods. These foods tend to be high in fat, sodium,  and added sugar.  · Shop around the outside edge of the grocery store. This includes fresh fruits and vegetables, bulk grains, fresh meats, and fresh dairy.  Cooking  · Use low-heat cooking methods, such as baking, instead of high-heat cooking methods like deep frying.  · Cook using healthy oils, such as olive, canola, or sunflower oil.  · Avoid cooking with butter, cream, or high-fat meats.  Meal planning  · Eat meals and snacks regularly, preferably at the same times every day. Avoid going long periods of time without eating.  · Eat foods high in fiber, such as fresh fruits, vegetables, beans, and whole grains. Talk to your dietitian about how many servings of carbs you can eat at each meal.  · Eat 4-6 ounces (oz) of lean protein each day, such as lean meat, chicken, fish, eggs, or tofu. One oz of lean protein is equal to:  ? 1 oz of meat, chicken, or fish.  ? 1 egg.  ? ¼ cup of tofu.  · Eat some foods each day that contain healthy fats, such as avocado, nuts, seeds, and fish.  Lifestyle  · Check your blood glucose regularly.  · Exercise regularly as told by your health care provider. This may include:  ? 150 minutes of moderate-intensity or vigorous-intensity exercise each week. This could be brisk walking, biking, or water aerobics.  ? Stretching and doing strength exercises, such as yoga or weightlifting, at least 2 times a week.  · Take medicines as told by your health care provider.  · Do not use any products that contain nicotine or tobacco, such as cigarettes and e-cigarettes. If you need help quitting, ask your health care provider.  · Work with a counselor or diabetes educator to identify strategies to manage stress and any emotional and social challenges.  Questions to ask a health care provider  · Do I need to meet with a diabetes educator?  · Do I need to meet with a dietitian?  · What number can I call if I have questions?  · When are the best times to check my blood glucose?  Where to find more  information:  · American Diabetes Association: diabetes.org  · Academy of Nutrition and Dietetics: www.eatright.org  · National Institute of Diabetes and Digestive and Kidney Diseases (NIH): www.niddk.nih.gov  Summary  · A healthy meal plan will help you control your blood glucose and maintain a healthy lifestyle.  · Working with a diet and nutrition specialist (dietitian) can help you make a meal plan that is best for you.  · Keep in mind that carbohydrates (carbs) and alcohol have immediate effects on your blood glucose levels. It is important to count carbs and to use alcohol carefully.  This information is not intended to   replace advice given to you by your health care provider. Make sure you discuss any questions you have with your health care provider.  Document Released: 07/12/2005 Document Revised: 05/15/2017 Document Reviewed: 11/19/2016  Elsevier Interactive Patient Education © 2019 [employer]

## 2018-12-09 LAB — BMP8+EGFR
BUN/Creatinine Ratio: 19 (ref 12–28)
BUN: 14 mg/dL (ref 8–27)
CALCIUM: 9.4 mg/dL (ref 8.7–10.3)
CO2: 24 mmol/L (ref 20–29)
Chloride: 99 mmol/L (ref 96–106)
Creatinine, Ser: 0.72 mg/dL (ref 0.57–1.00)
GFR calc Af Amer: 103 mL/min/{1.73_m2} (ref >59)
GFR, EST NON AFRICAN AMERICAN: 89 mL/min/{1.73_m2} (ref >59)
Glucose: 245 mg/dL — ABNORMAL HIGH (ref 65–99)
POTASSIUM: 4.2 mmol/L (ref 3.5–5.2)
Sodium: 138 mmol/L (ref 134–144)

## 2018-12-09 LAB — HEMOGLOBIN A1C
ESTIMATED AVERAGE GLUCOSE: 283 mg/dL
Hgb A1c MFr Bld: 11.5 % — ABNORMAL HIGH (ref 4.8–5.6)

## 2018-12-10 ENCOUNTER — Telehealth: Payer: Self-pay

## 2018-12-10 NOTE — Telephone Encounter (Signed)
Left the pt a message to call back for her lab results. 

## 2018-12-10 NOTE — Telephone Encounter (Signed)
-----   Message from Glendale Chard, MD sent at 12/09/2018  9:53 PM EST ----- Your kidney fxn is nl. Your hba1c is worse at 11.5. pls cut out breads as we discussed during your visit. pls call in am sugars every Monday and Th so I can adjust your insulin.

## 2018-12-18 ENCOUNTER — Telehealth: Payer: Self-pay

## 2018-12-18 NOTE — Telephone Encounter (Signed)
Left the pt a message to call back for her lab results. 

## 2019-02-09 ENCOUNTER — Encounter: Payer: Self-pay | Admitting: Internal Medicine

## 2019-02-09 ENCOUNTER — Ambulatory Visit (INDEPENDENT_AMBULATORY_CARE_PROVIDER_SITE_OTHER): Payer: Medicare Other | Admitting: Internal Medicine

## 2019-02-09 ENCOUNTER — Other Ambulatory Visit: Payer: Self-pay

## 2019-02-09 VITALS — BP 124/66 | HR 46 | Temp 98.5°F | Ht 66.4 in | Wt 174.4 lb

## 2019-02-09 DIAGNOSIS — F17210 Nicotine dependence, cigarettes, uncomplicated: Secondary | ICD-10-CM | POA: Diagnosis not present

## 2019-02-09 DIAGNOSIS — E1165 Type 2 diabetes mellitus with hyperglycemia: Secondary | ICD-10-CM

## 2019-02-09 DIAGNOSIS — I10 Essential (primary) hypertension: Secondary | ICD-10-CM | POA: Diagnosis not present

## 2019-02-09 NOTE — Patient Instructions (Signed)
Diabetes Mellitus and Exercise Exercising regularly is important for your overall health, especially when you have diabetes (diabetes mellitus). Exercising is not only about losing weight. It has many other health benefits, such as increasing muscle strength and bone density and reducing body fat and stress. This leads to improved fitness, flexibility, and endurance, all of which result in better overall health. Exercise has additional benefits for people with diabetes, including:  Reducing appetite.  Helping to lower and control blood glucose.  Lowering blood pressure.  Helping to control amounts of fatty substances (lipids) in the blood, such as cholesterol and triglycerides.  Helping the body to respond better to insulin (improving insulin sensitivity).  Reducing how much insulin the body needs.  Decreasing the risk for heart disease by: ? Lowering cholesterol and triglyceride levels. ? Increasing the levels of good cholesterol. ? Lowering blood glucose levels. What is my activity plan? Your health care provider or certified diabetes educator can help you make a plan for the type and frequency of exercise (activity plan) that works for you. Make sure that you:  Do at least 150 minutes of moderate-intensity or vigorous-intensity exercise each week. This could be brisk walking, biking, or water aerobics. ? Do stretching and strength exercises, such as yoga or weightlifting, at least 2 times a week. ? Spread out your activity over at least 3 days of the week.  Get some form of physical activity every day. ? Do not go more than 2 days in a row without some kind of physical activity. ? Avoid being inactive for more than 30 minutes at a time. Take frequent breaks to walk or stretch.  Choose a type of exercise or activity that you enjoy, and set realistic goals.  Start slowly, and gradually increase the intensity of your exercise over time. What do I need to know about managing my  diabetes?   Check your blood glucose before and after exercising. ? If your blood glucose is 240 mg/dL (13.3 mmol/L) or higher before you exercise, check your urine for ketones. If you have ketones in your urine, do not exercise until your blood glucose returns to normal. ? If your blood glucose is 100 mg/dL (5.6 mmol/L) or lower, eat a snack containing 15-20 grams of carbohydrate. Check your blood glucose 15 minutes after the snack to make sure that your level is above 100 mg/dL (5.6 mmol/L) before you start your exercise.  Know the symptoms of low blood glucose (hypoglycemia) and how to treat it. Your risk for hypoglycemia increases during and after exercise. Common symptoms of hypoglycemia can include: ? Hunger. ? Anxiety. ? Sweating and feeling clammy. ? Confusion. ? Dizziness or feeling light-headed. ? Increased heart rate or palpitations. ? Blurry vision. ? Tingling or numbness around the mouth, lips, or tongue. ? Tremors or shakes. ? Irritability.  Keep a rapid-acting carbohydrate snack available before, during, and after exercise to help prevent or treat hypoglycemia.  Avoid injecting insulin into areas of the body that are going to be exercised. For example, avoid injecting insulin into: ? The arms, when playing tennis. ? The legs, when jogging.  Keep records of your exercise habits. Doing this can help you and your health care provider adjust your diabetes management plan as needed. Write down: ? Food that you eat before and after you exercise. ? Blood glucose levels before and after you exercise. ? The type and amount of exercise you have done. ? When your insulin is expected to peak, if you use   insulin. Avoid exercising at times when your insulin is peaking.  When you start a new exercise or activity, work with your health care provider to make sure the activity is safe for you, and to adjust your insulin, medicines, or food intake as needed.  Drink plenty of water while  you exercise to prevent dehydration or heat stroke. Drink enough fluid to keep your urine clear or pale yellow. Summary  Exercising regularly is important for your overall health, especially when you have diabetes (diabetes mellitus).  Exercising has many health benefits, such as increasing muscle strength and bone density and reducing body fat and stress.  Your health care provider or certified diabetes educator can help you make a plan for the type and frequency of exercise (activity plan) that works for you.  When you start a new exercise or activity, work with your health care provider to make sure the activity is safe for you, and to adjust your insulin, medicines, or food intake as needed. This information is not intended to replace advice given to you by your health care provider. Make sure you discuss any questions you have with your health care provider. Document Released: 01/05/2004 Document Revised: 04/25/2017 Document Reviewed: 03/26/2016 Elsevier Interactive Patient Education  2019 Elsevier Inc.  

## 2019-02-09 NOTE — Progress Notes (Signed)
Subjective:     Patient ID: Kathryn Walsh , female    DOB: November 20, 1955 , 63 y.o.   MRN: [id number]   Chief Complaint  Patient presents with  . Diabetes  . Hypertension    HPI  Diabetes  She presents for her follow-up diabetic visit. She has type 2 diabetes mellitus. Her disease course has been improving. There are no hypoglycemic associated symptoms. Pertinent negatives for diabetes include no blurred vision and no chest pain. There are no hypoglycemic complications. Risk factors for coronary artery disease include diabetes mellitus, dyslipidemia, hypertension, sedentary lifestyle and post-menopausal. She is compliant with treatment some of the time. Her weight is fluctuating minimally. She is following a generally healthy diet. Her home blood glucose trend is fluctuating minimally. Her breakfast blood glucose is taken between 8-9 am. Her breakfast blood glucose range is generally 140-180 mg/dl.  Hypertension  This is a chronic problem. The current episode started more than 1 month ago. The problem has been gradually improving since onset. Pertinent negatives include no blurred vision, chest pain, palpitations or shortness of breath.   She reports compliance with meds.  Past Medical History:  Diagnosis Date  . Diabetes mellitus without complication ([place])   . High cholesterol   . HTN (hypertension)   . Liver transplant recipient [facility])      Family History  Problem Relation Age of Onset  . Healthy Mother   . Healthy Father      Current Outpatient Medications:  .  amLODipine (NORVASC) 10 MG tablet, TAKE 1 TABLET BY MOUTH DAILY. (Patient taking differently: TAKE 5 MG TABLET BY MOUTH DAILY.), Disp: 90 tablet, Rfl: 3 .  aspirin 81 MG chewable tablet, Chew 81 mg by mouth daily., Disp: , Rfl:  .  Cholecalciferol (VITAMIN D HIGH POTENCY) 25 MCG (1000 UT) capsule, Take 4,000 Units by mouth daily., Disp: , Rfl:  .  Insulin Glargine, 2 Unit Dial, (TOUJEO MAX SOLOSTAR) 300 UNIT/ML SOPN,  INJECT 20 UNITS INTO THE SKIN AT BEDTIME PER INSULIN PROTOCOL (Patient taking differently: 32 Units. ), Disp: 9 pen, Rfl: 7 .  insulin lispro (HUMALOG KWIKPEN) 100 UNIT/ML KiwkPen, Inject 0-15 Units into the skin 3 (three) times daily as needed (high blood sugar)., Disp: , Rfl:  .  Multiple Vitamin (MULTIVITAMIN WITH MINERALS) TABS tablet, Take 1 tablet by mouth daily., Disp: , Rfl:  .  propranolol (INDERAL) 20 MG tablet, Take 20 mg by mouth 2 (two) times daily., Disp: , Rfl:  .  tacrolimus (PROGRAF) 1 MG capsule, Take 2 mg in am and 3 mg in pm, Disp: , Rfl:  .  azaTHIOprine (IMURAN) 50 MG tablet, Take 100 mg by mouth 2 (two) times daily. , Disp: , Rfl:    No Known Allergies   Review of Systems  Constitutional: Negative.   HENT: Negative.   Eyes: Negative for blurred vision.  Respiratory: Negative.  Negative for shortness of breath.   Cardiovascular: Negative.  Negative for chest pain and palpitations.  Gastrointestinal: Negative.   Genitourinary: Negative.   Neurological: Negative.   Psychiatric/Behavioral: Negative.      Today's Vitals   02/09/19 1007  BP: 124/66  Pulse: (!) 46  Temp: 98.5 F (36.9 C)  TempSrc: Oral  Weight: 174 lb 6.4 oz (79.1 kg)  Height: 5' 6.4" (1.687 m)  PainSc: 0-No pain   Body mass index is 27.81 kg/m.   Objective:  Physical Exam Vitals signs and nursing note reviewed.  Constitutional:      Appearance: Normal  appearance.  HENT:     Head: Normocephalic and atraumatic.  Cardiovascular:     Rate and Rhythm: Normal rate and regular rhythm.     Heart sounds: Normal heart sounds.  Pulmonary:     Effort: Pulmonary effort is normal.     Breath sounds: Normal breath sounds.  Skin:    General: Skin is warm.  Neurological:     General: No focal deficit present.     Mental Status: She is alert.  Psychiatric:        Mood and Affect: Mood normal.        Behavior: Behavior normal.         Assessment And Plan:     1. Uncontrolled type 2 diabetes  mellitus with hyperglycemia ([place])  I will check labs as listed below. Importance of regular exercise was discussed with the patient.  Encouraged to comply with dietary recommendations to help her achieve optimal glycemic control.   - BMP8+EGFR - Hemoglobin A1c - Lipid panel  2. Essential hypertension  Well controlled. She will continue with current meds. She is encouraged to avoid adding salt to her foods.   3. Cigarette nicotine dependence without complication  She is smoking 4 cigs/ day.  She is encouraged to choose a quit date. She reports she will think about this in the future. Increased risk of cancers, cardiovascular disease associated with tobacco use was discussed with the patient in full detail.   Maximino Greenland, MD    THE PATIENT IS ENCOURAGED TO PRACTICE SOCIAL DISTANCING DUE TO THE COVID-19 PANDEMIC.

## 2019-02-10 LAB — LIPID PANEL
Chol/HDL Ratio: 4 ratio (ref 0.0–4.4)
Cholesterol, Total: 174 mg/dL (ref 100–199)
HDL: 44 mg/dL (ref >39)
LDL Calculated: 96 mg/dL (ref 0–99)
Triglycerides: 168 mg/dL — ABNORMAL HIGH (ref 0–149)
VLDL Cholesterol Cal: 34 mg/dL (ref 5–40)

## 2019-02-10 LAB — BMP8+EGFR
BUN/Creatinine Ratio: 20 (ref 12–28)
BUN: 13 mg/dL (ref 8–27)
CO2: 21 mmol/L (ref 20–29)
Calcium: 9.1 mg/dL (ref 8.7–10.3)
Chloride: 102 mmol/L (ref 96–106)
Creatinine, Ser: 0.66 mg/dL (ref 0.57–1.00)
GFR calc Af Amer: 109 mL/min/{1.73_m2} (ref >59)
GFR calc non Af Amer: 94 mL/min/{1.73_m2} (ref >59)
Glucose: 199 mg/dL — ABNORMAL HIGH (ref 65–99)
Potassium: 4.5 mmol/L (ref 3.5–5.2)
Sodium: 138 mmol/L (ref 134–144)

## 2019-02-10 LAB — HEMOGLOBIN A1C
Est. average glucose Bld gHb Est-mCnc: 243 mg/dL
Hgb A1c MFr Bld: 10.1 % — ABNORMAL HIGH (ref 4.8–5.6)

## 2019-02-18 ENCOUNTER — Other Ambulatory Visit: Payer: Self-pay

## 2019-02-18 ENCOUNTER — Telehealth: Payer: Self-pay

## 2019-02-18 MED ORDER — PRAVASTATIN SODIUM 40 MG PO TABS: 40.0000 mg | ORAL_TABLET | Freq: Every day | ORAL | 1 refills | Status: DC

## 2019-02-18 NOTE — Telephone Encounter (Signed)
I left the pt a message that I needed her to call back so that I can schedule her an appt for a f/u on her pravastatin medication.

## 2019-03-14 ENCOUNTER — Other Ambulatory Visit: Payer: Self-pay | Admitting: Internal Medicine

## 2019-04-07 ENCOUNTER — Other Ambulatory Visit: Payer: Self-pay

## 2019-04-07 ENCOUNTER — Ambulatory Visit (INDEPENDENT_AMBULATORY_CARE_PROVIDER_SITE_OTHER): Payer: Medicare Other | Admitting: Internal Medicine

## 2019-04-07 ENCOUNTER — Encounter: Payer: Self-pay | Admitting: Internal Medicine

## 2019-04-07 ENCOUNTER — Other Ambulatory Visit (HOSPITAL_COMMUNITY)
Admission: RE | Admit: 2019-04-07 | Discharge: 2019-04-07 | Disposition: A | Discharge status: Home / Self Care | Payer: Medicare Other | Source: Ambulatory Visit | Attending: Internal Medicine | Admitting: Internal Medicine

## 2019-04-07 VITALS — BP 116/78 | HR 77 | Temp 98.6°F | Ht 67.0 in | Wt 165.6 lb

## 2019-04-07 DIAGNOSIS — F17210 Nicotine dependence, cigarettes, uncomplicated: Secondary | ICD-10-CM

## 2019-04-07 DIAGNOSIS — E1165 Type 2 diabetes mellitus with hyperglycemia: Secondary | ICD-10-CM | POA: Diagnosis not present

## 2019-04-07 DIAGNOSIS — M5431 Sciatica, right side: Secondary | ICD-10-CM | POA: Diagnosis not present

## 2019-04-07 DIAGNOSIS — Z794 Long term (current) use of insulin: Secondary | ICD-10-CM

## 2019-04-07 DIAGNOSIS — Z1211 Encounter for screening for malignant neoplasm of colon: Secondary | ICD-10-CM

## 2019-04-07 DIAGNOSIS — Z01419 Encounter for gynecological examination (general) (routine) without abnormal findings: Secondary | ICD-10-CM

## 2019-04-07 DIAGNOSIS — I10 Essential (primary) hypertension: Secondary | ICD-10-CM

## 2019-04-07 DIAGNOSIS — Z0000 Encounter for general adult medical examination without abnormal findings: Secondary | ICD-10-CM | POA: Diagnosis not present

## 2019-04-07 LAB — POCT UA - MICROALBUMIN
Albumin/Creatinine Ratio, Urine, POC: 300
Creatinine, POC: 300 mg/dL
Microalbumin Ur, POC: 150 mg/L

## 2019-04-07 LAB — POCT URINALYSIS DIPSTICK
Bilirubin, UA: NEGATIVE
Glucose, UA: POSITIVE — AB
Leukocytes, UA: NEGATIVE
Nitrite, UA: NEGATIVE
Protein, UA: POSITIVE — AB
Spec Grav, UA: 1.02 (ref 1.010–1.025)
Urobilinogen, UA: 2 E.U./dL — AB
pH, UA: 5.5 (ref 5.0–8.0)

## 2019-04-07 LAB — POC HEMOCCULT BLD/STL (OFFICE/1-CARD/DIAGNOSTIC)
Card #1 Date: 6092020
Fecal Occult Blood, POC: NEGATIVE

## 2019-04-07 MED ORDER — CYCLOBENZAPRINE HCL 10 MG PO TABS: ORAL_TABLET | ORAL | 0 refills | Status: DC

## 2019-04-07 NOTE — Patient Instructions (Signed)

## 2019-04-07 NOTE — Progress Notes (Signed)
Subjective:     Patient ID: Kathryn Walsh , female    DOB: 02-Aug-1956 , 63 y.o.   MRN: [id number]   Chief Complaint  Patient presents with  . Annual Exam  . Diabetes  . Hypertension    HPI  She is here today for a full physical examination.  She is no longer seen by GYN. She is not currently sexually active.   Diabetes  She presents for her follow-up diabetic visit. She has type 2 diabetes mellitus. Her disease course has been worsening. There are no hypoglycemic associated symptoms. Pertinent negatives for diabetes include no blurred vision and no chest pain. There are no hypoglycemic complications. Risk factors for coronary artery disease include diabetes mellitus, dyslipidemia, hypertension and sedentary lifestyle. Her weight is decreasing steadily. She is following a diabetic diet. She participates in exercise intermittently. Her breakfast blood glucose is taken between 9-10 am. Her breakfast blood glucose range is generally 140-180 mg/dl.  Hypertension  This is a chronic problem. The current episode started more than 1 year ago. The problem has been gradually improving since onset. The problem is controlled. Pertinent negatives include no blurred vision, chest pain, palpitations or shortness of breath. Risk factors for coronary artery disease include diabetes mellitus, dyslipidemia and post-menopausal state.     Past Medical History:  Diagnosis Date  . Diabetes mellitus without complication ([place])   . High cholesterol   . HTN (hypertension)   . Liver transplant recipient [facility])      Family History  Problem Relation Age of Onset  . Healthy Mother   . Healthy Father      Current Outpatient Medications:  .  amLODipine (NORVASC) 10 MG tablet, TAKE 1 TABLET BY MOUTH DAILY. (Patient taking differently: TAKE 5 MG TABLET BY MOUTH DAILY.), Disp: 90 tablet, Rfl: 3 .  aspirin 81 MG chewable tablet, Chew 81 mg by mouth daily., Disp: , Rfl:  .  azaTHIOprine (IMURAN) 50 MG tablet,  Take 100 mg by mouth 2 (two) times daily. , Disp: , Rfl:  .  Cholecalciferol (VITAMIN D HIGH POTENCY) 25 MCG (1000 UT) capsule, Take 4,000 Units by mouth daily., Disp: , Rfl:  .  Insulin Glargine, 2 Unit Dial, (TOUJEO MAX SOLOSTAR) 300 UNIT/ML SOPN, INJECT 20 UNITS INTO THE SKIN AT BEDTIME PER INSULIN PROTOCOL (Patient taking differently: 32 Units. ), Disp: 9 pen, Rfl: 7 .  insulin lispro (HUMALOG KWIKPEN) 100 UNIT/ML KiwkPen, Inject 0-15 Units into the skin 3 (three) times daily as needed (high blood sugar)., Disp: , Rfl:  .  Multiple Vitamin (MULTIVITAMIN WITH MINERALS) TABS tablet, Take 1 tablet by mouth daily., Disp: , Rfl:  .  pravastatin (PRAVACHOL) 40 MG tablet, TAKE 1 TABLET BY MOUTH EVERYDAY AT BEDTIME, Disp: 30 tablet, Rfl: 1 .  propranolol (INDERAL) 20 MG tablet, Take 20 mg by mouth 2 (two) times daily., Disp: , Rfl:  .  tacrolimus (PROGRAF) 1 MG capsule, Take 2 mg in am and 3 mg in pm, Disp: , Rfl:  .  cyclobenzaprine (FLEXERIL) 10 MG tablet, One tab po qhs pr, Disp: 30 tablet, Rfl: 0   No Known Allergies    The patient states she uses post menopausal status for birth control. Last LMP was No LMP recorded. Patient is postmenopausal.. Negative for Dysmenorrhea Negative for: breast discharge, breast lump(s), breast pain and breast self exam. Associated symptoms include abnormal vaginal bleeding. Pertinent negatives include abnormal bleeding (hematology), anxiety, decreased libido, depression, difficulty falling sleep, dyspareunia, history of infertility, nocturia, sexual  dysfunction, sleep disturbances, urinary incontinence, urinary urgency, vaginal discharge and vaginal itching. Diet regular.The patient states her exercise level is  minimal.  . The patient's tobacco use is:  Social History   Tobacco Use  Smoking Status Current Some Day Smoker  . Packs/day: 0.25  . Years: 12.00  . Pack years: 3.00  . Types: Cigarettes  Smokeless Tobacco Never Used  . She has been exposed to passive  smoke. The patient's alcohol use is:  Social History   Substance and Sexual Activity  Alcohol Use No    Review of Systems  Constitutional: Negative.   HENT: Negative.   Eyes: Negative.  Negative for blurred vision.  Respiratory: Negative.  Negative for shortness of breath.   Cardiovascular: Negative.  Negative for chest pain and palpitations.  Gastrointestinal: Negative.   Endocrine: Negative.   Genitourinary: Negative.   Musculoskeletal: Positive for back pain.  Allergic/Immunologic: Negative.   Neurological: Negative.   Hematological: Negative.   Psychiatric/Behavioral: Negative.      Today's Vitals   04/07/19 0950  BP: 116/78  Pulse: 77  Temp: 98.6 F (37 C)  TempSrc: Oral  Weight: 165 lb 9.6 oz (75.1 kg)  Height: 5\' 7"  (1.702 m)  PainSc: 0-No pain   Body mass index is 25.94 kg/m.   Objective:  Physical Exam Vitals signs and nursing note reviewed. Exam conducted with a chaperone present.  Constitutional:      Appearance: Normal appearance.  HENT:     Head: Normocephalic and atraumatic.     Right Ear: Tympanic membrane, ear canal and external ear normal.     Left Ear: Tympanic membrane, ear canal and external ear normal.     Nose: Nose normal.     Mouth/Throat:     Mouth: Mucous membranes are moist.     Pharynx: Oropharynx is clear.  Eyes:     Extraocular Movements: Extraocular movements intact.     Conjunctiva/sclera: Conjunctivae normal.     Pupils: Pupils are equal, round, and reactive to light.  Neck:     Musculoskeletal: Normal range of motion and neck supple.  Cardiovascular:     Rate and Rhythm: Normal rate and regular rhythm.     Pulses: Normal pulses.          Dorsalis pedis pulses are 2+ on the right side and 2+ on the left side.     Heart sounds: Normal heart sounds.  Pulmonary:     Effort: Pulmonary effort is normal.     Breath sounds: Normal breath sounds.  Chest:     Breasts:        Right: Normal. No swelling, bleeding, inverted  nipple, mass, nipple discharge or skin change.        Left: Normal. No swelling, bleeding, inverted nipple, mass, nipple discharge or skin change.  Abdominal:     General: Abdomen is flat. Bowel sounds are normal.     Palpations: Abdomen is soft.  Genitourinary:    General: Normal vulva.     Exam position: Lithotomy position.     Vagina: Normal.     Cervix: Normal.     Uterus: Normal.      Adnexa: Right adnexa normal and left adnexa normal.     Rectum: Normal. Guaiac result negative.  Musculoskeletal: Normal range of motion.  Feet:     Right foot:     Protective Sensation: 5 sites tested. 5 sites sensed.     Skin integrity: Callus present.     Toenail Condition:  Right toenails are normal.     Left foot:     Protective Sensation: 5 sites tested. 5 sites sensed.     Skin integrity: Callus present.     Toenail Condition: Left toenails are normal.  Skin:    General: Skin is warm and dry.  Neurological:     General: No focal deficit present.     Mental Status: She is alert and oriented to person, place, and time.  Psychiatric:        Mood and Affect: Mood normal.        Behavior: Behavior normal.         Assessment And Plan:     1. Routine general medical examination at health care facility  A full exam was performed. Importance of monthly self breast exams was discussed with the patient. PATIENT HAS BEEN ADVISED TO GET 30-45 MINUTES REGULAR EXERCISE NO LESS THAN FOUR TO FIVE DAYS PER WEEK - BOTH WEIGHTBEARING EXERCISES AND AEROBIC ARE RECOMMENDED.  SHE IS ADVISED TO FOLLOW A HEALTHY DIET WITH AT LEAST SIX FRUITS/VEGGIES PER DAY, DECREASE INTAKE OF RED MEAT, AND TO INCREASE FISH INTAKE TO TWO DAYS PER WEEK.  MEATS/FISH SHOULD NOT BE FRIED, BAKED OR BROILED IS PREFERABLE.  I SUGGEST WEARING SPF 50 SUNSCREEN ON EXPOSED PARTS AND ESPECIALLY WHEN IN THE DIRECT SUNLIGHT FOR AN EXTENDED PERIOD OF TIME.  PLEASE AVOID FAST FOOD RESTAURANTS AND INCREASE YOUR WATER INTAKE.  - CBC -  Hemoglobin A1c - Liver Profile  2. Encntr for gyn exam (general) (routine) w/o abn findings  Pap smear performed, along with HPV testing. Stool is guiaic negative.   3. Uncontrolled type 2 diabetes mellitus with hyperglycemia ([facility])  Diabetic foot exam was performed. I DISCUSSED WITH THE PATIENT AT LENGTH REGARDING THE GOALS OF GLYCEMIC CONTROL AND POSSIBLE LONG-TERM COMPLICATIONS.  I  ALSO STRESSED THE IMPORTANCE OF COMPLIANCE WITH HOME GLUCOSE MONITORING, DIETARY RESTRICTIONS INCLUDING AVOIDANCE OF SUGARY DRINKS/PROCESSED FOODS,  ALONG WITH REGULAR EXERCISE.  I  ALSO STRESSED THE IMPORTANCE OF ANNUAL EYE EXAMS, SELF FOOT CARE AND COMPLIANCE WITH OFFICE VISITS.   4. Essential hypertension  Well controlled. She will continue with current meds. She is encouraged to avoid adding salt to her foods. EKG performed, no new changes noted. She will rto in six months for her next evaluation.   - EKG 12-Lead  5. Cigarette nicotine dependence without complication  Importance of smoking cessation was discussed with the patient for greater than 3 minutes. She reports smoking 1/2 ppd since the age of 16. She denies ever smoking 1 ppd. She does not wish to quit at this time.   6. Sciatica of right side  She was given rx cyclobenzaprine, 10 mg to use nightly as needed. She will let me know if her symptoms persist. She is also encouraged to perform back stretches daily. She may also benefit from OTC lidocaine patches to affected area as needed.   Maximino Greenland, MD    THE PATIENT IS ENCOURAGED TO PRACTICE SOCIAL DISTANCING DUE TO THE COVID-19 PANDEMIC.

## 2019-04-08 ENCOUNTER — Other Ambulatory Visit: Payer: Self-pay

## 2019-04-08 DIAGNOSIS — E1165 Type 2 diabetes mellitus with hyperglycemia: Secondary | ICD-10-CM

## 2019-04-08 LAB — CBC
Hematocrit: 41.3 % (ref 34.0–46.6)
Hemoglobin: 14.3 g/dL (ref 11.1–15.9)
MCH: 30.9 pg (ref 26.6–33.0)
MCHC: 34.6 g/dL (ref 31.5–35.7)
MCV: 89 fL (ref 79–97)
Platelets: 131 10*3/uL — ABNORMAL LOW (ref 150–450)
RBC: 4.63 x10E6/uL (ref 3.77–5.28)
RDW: 14.5 % (ref 11.7–15.4)
WBC: 6.2 10*3/uL (ref 3.4–10.8)

## 2019-04-08 LAB — HEPATIC FUNCTION PANEL
ALT: 9 IU/L (ref 0–32)
AST: 13 IU/L (ref 0–40)
Albumin: 4.3 g/dL (ref 3.8–4.8)
Alkaline Phosphatase: 198 IU/L — ABNORMAL HIGH (ref 39–117)
Bilirubin Total: 0.6 mg/dL (ref 0.0–1.2)
Bilirubin, Direct: 0.19 mg/dL (ref 0.00–0.40)
Total Protein: 7.6 g/dL (ref 6.0–8.5)

## 2019-04-08 LAB — HEMOGLOBIN A1C
Est. average glucose Bld gHb Est-mCnc: 255 mg/dL
Hgb A1c MFr Bld: 10.5 % — ABNORMAL HIGH (ref 4.8–5.6)

## 2019-04-09 ENCOUNTER — Telehealth: Payer: Self-pay

## 2019-04-09 LAB — CYTOLOGY - PAP
Diagnosis: NEGATIVE
HPV: NOT DETECTED

## 2019-04-14 ENCOUNTER — Other Ambulatory Visit: Payer: Self-pay | Admitting: Internal Medicine

## 2019-04-15 ENCOUNTER — Other Ambulatory Visit: Payer: Self-pay

## 2019-04-15 MED ORDER — ACCU-CHEK AVIVA PLUS W/DEVICE KIT: PACK | 1 refills | Status: DC

## 2019-04-16 ENCOUNTER — Telehealth: Payer: Self-pay

## 2019-04-17 ENCOUNTER — Other Ambulatory Visit: Payer: Self-pay

## 2019-04-17 ENCOUNTER — Ambulatory Visit (INDEPENDENT_AMBULATORY_CARE_PROVIDER_SITE_OTHER): Payer: Medicare Other | Admitting: Pharmacist

## 2019-04-17 DIAGNOSIS — I10 Essential (primary) hypertension: Secondary | ICD-10-CM | POA: Diagnosis not present

## 2019-04-17 DIAGNOSIS — E1165 Type 2 diabetes mellitus with hyperglycemia: Secondary | ICD-10-CM

## 2019-04-17 NOTE — Patient Outreach (Signed)
[place] [facility] [facility]) Care Management  04/17/2019  Kathryn Walsh [birth year] [id number]  TELEPHONE SCREENING Referral date: 04/17/19 Referral source: primary MD  Referral reason: medication assistance Insurance: [employer]  Attempt #1  Telephone call to patient regarding primary MD referral. Attempted call to listed phone number [phone].  Message states caller cannot take call. Unable to leave message.  Attempted call to listed number [phone] phone number is disconnected.   PLAN: RNCM will attempt 2nd telephone outreach to patient within 4 business days.  RNCM will send outreach letter to patient to attempt contact   Quinn Plowman RN,BSN,CCM [facility] Telephonic  [phone]

## 2019-04-21 ENCOUNTER — Other Ambulatory Visit: Payer: Self-pay

## 2019-04-21 NOTE — Progress Notes (Signed)
  Chronic Care Management   Initial Visit Note  04/17/2019 Name: Kathryn Walsh MRN: [id number] DOB: 03-16-1956  Referred by: Glendale Chard, MD Reason for referral : Chronic Care Management   Kathryn Walsh is a 63 y.o. year old female who is a primary care patient of Glendale Chard, MD. The CCM team was consulted for assistance with chronic disease management and care coordination needs.   Review of patient status, including review of consultants reports, relevant laboratory and other test results, and collaboration with appropriate care team members and the patient's provider was performed as part of comprehensive patient evaluation and provision of chronic care management services.    I spoke with [name] by telephone today.  Objective:   Goals Addressed            This Visit's Progress     Patient Stated   . I want to apply for financial assistance for my insulins (pt-stated)       Current Barriers:  . Non Adherence to prescribed medication regimen . Financial Barriers  Pharmacist Clinical Goal(s):  [name] Over the next 30 days, patient will work with PharmD to address needs related to applying for medication assistance  Interventions: [name] Medication assistance applications mailed to patient for Toujeo and Humalog.  Patient agreeable to participate in programs. . Patient states that her blood sugars are "coming down", but does not elaborate on numbers.  She denies hypoglycemia.  Patient uses an Accu[employer] glucometer and needs test strip refills (will send CMA message for refill).  Will continue to work with patient on diabetes control . Reviewed dispense history-->patient appears compliant with medication fills.   . Statin in diabetes --> patient has been taking her statin as prescribed. . Patient's last A1c 10.5 on 04/07/19 . Will discuss patient case with CCM RN CM, Angel Little to help engage patient with diabetes control.  Patient Self Care Activities:  . Attends  all scheduled provider appointments . Calls pharmacy for medication refills . Performs ADL's independently  Initial goal documentation         [name] was given information about Chronic Care Management services today including:  1. CCM service includes personalized support from designated clinical staff supervised by her physician, including individualized plan of care and coordination with other care providers 2. 24/7 contact phone numbers for assistance for urgent and routine care needs. 3. Service will only be billed when office clinical staff spend 20 minutes or more in a month to coordinate care. 4. Only one practitioner may furnish and bill the service in a calendar month. 5. The patient may stop CCM services at any time (effective at the end of the month) by phone call to the office staff. 6. The patient will be responsible for cost sharing (co-pay) of up to 20% of the service fee (after annual deductible is met).  Patient agreed to services and verbal consent obtained.   Plan:   The care management team will reach out to the patient again over the next 14 days.   Regina Eck, PharmD, BCPS Clinical Pharmacist, [facility] [place]: [phone]

## 2019-04-21 NOTE — Patient Instructions (Signed)
Visit Information  Goals Addressed            This Visit's Progress     Patient Stated   . I want to apply for financial assistance for my insulins (pt-stated)       Current Barriers:  . Non Adherence to prescribed medication regimen . Financial Barriers  Pharmacist Clinical Goal(s):  [name] Over the next 30 days, patient will work with PharmD to address needs related to applying for medication assistance  Interventions: [name] Medication assistance applications mailed to patient for Toujeo and Humalog.  Patient agreeable to participate in programs. . Patient states that her blood sugars are "coming down", but does not elaborate on numbers.  She denies hypoglycemia.  Patient uses an AccuCheck glucometer and needs test strip refills (will send CMA message for refill).  Will continue to work with patient on diabetes control . Reviewed dispense history-->patient appears compliant with medication fills.   . Statin in diabetes --> patient has been taking her statin as prescribed. . Patient's last A1c 10.5 on 04/07/19 . Will discuss patient case with CCM RN CM, Angel Little to help engage patient with diabetes control.  Patient Self Care Activities:  . Attends all scheduled provider appointments . Calls pharmacy for medication refills . Performs ADL's independently  Initial goal documentation        The patient verbalized understanding of instructions provided today and declined a print copy of patient instruction materials.   The care management team will reach out to the patient again over the next 14 days.   Regina Eck, PharmD, BCPS Clinical Pharmacist, [facility] [place]: [phone]

## 2019-04-21 NOTE — Patient Outreach (Signed)
[facility]) Care Management  04/21/2019  Kathryn Walsh May 15, 1956 [id number]  TELEPHONE SCREENING Referral date: 04/17/19 Referral source: primary MD  Referral reason: medication assistance Insurance: [employer]  Attempt #2  Telephone call to patient regarding primary MD referral.  Attempted call to listed home number.  Message states mail box is full and cannot accept messages.  Telephone call to patients listed mobile number.  Message states phone is disconnected.    PLAN: RNCM will attempt 3rd telephone call to patient within 4 business days.   Quinn Plowman RN,BSN, [place] Telephonic  [phone]

## 2019-04-22 ENCOUNTER — Other Ambulatory Visit: Payer: Self-pay

## 2019-04-22 NOTE — Patient Outreach (Signed)
[facility]) Care Management  04/22/2019  Aviva Wolfer 1956/01/06 [id number]  Care coordination follow up: [facility] received notification from [facility] pharmacist that patient is being followed up by the [facility] care management embedded team.  PLAN; No further telephonic outreach needed by this RNCM at this time.   Quinn Plowman RN,BSN,CCM [facility] Telephonic  [phone]

## 2019-04-27 ENCOUNTER — Ambulatory Visit: Payer: Self-pay

## 2019-04-29 ENCOUNTER — Telehealth: Payer: Self-pay

## 2019-04-29 ENCOUNTER — Ambulatory Visit: Payer: Self-pay

## 2019-04-29 NOTE — Chronic Care Management (AMB) (Signed)
  Chronic Care Management   Outreach Note  04/29/2019 Name: Kathryn Walsh MRN: [id number] DOB: 1956/06/26  Referred by: Glendale Chard, MD Reason for referral : Care Coordination   An unsuccessful telephone outreach was attempted today. The patient was referred to the case management team by for assistance with chronic care management and care coordination.   Follow Up Plan: The care management team will reach out to the patient again over the next 7-10 days.   Daneen Schick, BSW, CDP Social Worker, Certified Dementia Practitioner [place] [facility] [place] [facility] [phone]

## 2019-05-07 ENCOUNTER — Ambulatory Visit: Payer: Self-pay

## 2019-05-07 DIAGNOSIS — E1165 Type 2 diabetes mellitus with hyperglycemia: Secondary | ICD-10-CM

## 2019-05-07 NOTE — Patient Instructions (Signed)
Social Worker Visit Information  Goals we discussed today:  Goals Addressed            This Visit's Progress     Patient Stated   . I want to apply for financial assistance for my insulins (pt-stated)   Not on track    Current Barriers:  . Non Adherence to prescribed medication regimen . Financial Barriers  Pharmacist Clinical Goal(s):  [name] Over the next 30 days, patient will work with PharmD to address needs related to applying for medication assistance  Interventions: [name] Medication assistance applications mailed to patient for Toujeo and Humalog.  Patient agreeable to participate in programs. . Patient states that her blood sugars are "coming down", but does not elaborate on numbers.  She denies hypoglycemia.  Patient uses an AccuCheck glucometer and needs test strip refills (will send CMA message for refill).  Will continue to work with patient on diabetes control . Reviewed dispense history-->patient appears compliant with medication fills.   . Statin in diabetes --> patient has been taking her statin as prescribed. . Patient's last A1c 10.5 on 04/07/19 . Will discuss patient case with CCM RN CM, Angel Little to help engage patient with diabetes control.  CCM SW Interventions: Completed 05/07/19 with the patient . Outbound call to the patient to conduct SDOH screening call . Informed by the patient she has yet to complete the medication assistance applications due to not being able to locate her current social security award letter . "I guess I will just keep doing what I am doing" . Advised the patient CCM SW would communicate with embedded PharmD [name] to update why the patient has yet to complete application  . Collaboration with [name] PharmD via in basket message to communicate patient stated reasoning for not completing the application at this time  Patient Self Care Activities:  . Attends all scheduled provider appointments . Calls pharmacy for medication  refills . Performs ADL's independently  Please see past updates related to this goal by clicking on the "Past Updates" button in the selected goal          Materials Provided: No: Patient declined  Follow Up Plan: No planned follow up by SW at this time. Please call me if you have future resource needs.   Daneen Schick, BSW, CDP Social Worker, Certified Dementia Practitioner [facility] [place] [facility] [phone]

## 2019-05-07 NOTE — Chronic Care Management (AMB) (Signed)
  Chronic Care Management   Social Work General Note  05/07/2019 Name: [name] MRN: [id number] DOB: December 15, 1955  Kathryn Walsh is a 63 y.o. year old female who is a primary care patient of Glendale Chard, MD. The CCM was consulted to assist the patient with chronic care management.  Review of patient status, including review of consultants reports, relevant laboratory and other test results, and collaboration with appropriate care team members and the patient's provider was performed as part of comprehensive patient evaluation and provision of chronic care management services.    SDOH (Social Determinants of Health) screening performed today. The patient denies any current SDOH challenges during today's call. The patient does discuss challenges in completing her current pharmacy goal. See below for today's interventions related to medication assistance.  Goals Addressed            This Visit's Progress     Patient Stated   . I want to apply for financial assistance for my insulins (pt-stated)   Not on track    Current Barriers:  . Non Adherence to prescribed medication regimen . Financial Barriers  Pharmacist Clinical Goal(s):  [name] Over the next 30 days, patient will work with PharmD to address needs related to applying for medication assistance  CCM SW Interventions: Completed 05/07/19 with the patient . Outbound call to the patient to conduct SDOH screening call . Informed by the patient she has yet to complete the medication assistance applications due to not being able to locate her current social security award letter . "I guess I will just keep doing what I am doing" . Advised the patient CCM SW would communicate with embedded PharmD [name] to update why the patient has yet to complete application  . Collaboration with [name] PharmD via in basket message to communicate patient stated reasoning for not completing the application at this time  Patient Self  Care Activities:  . Attends all scheduled provider appointments . Calls pharmacy for medication refills . Performs ADL's independently  Please see past updates related to this goal by clicking on the "Past Updates" button in the selected goal          Follow Up Plan: No planned SW follow up at this time. The patient is encouraged to contact CCM SW for any future resource needs.        Daneen Schick, BSW, CDP Social Worker, Certified Dementia Practitioner [place] [facility] [place] [facility] [phone]  Total time spent performing care coordination and/or care management activities with the patient by phone or face to face = 12 minutes.

## 2019-05-08 ENCOUNTER — Other Ambulatory Visit: Payer: Self-pay | Admitting: Internal Medicine

## 2019-05-11 ENCOUNTER — Ambulatory Visit: Payer: Self-pay | Admitting: Pharmacist

## 2019-05-11 NOTE — Progress Notes (Signed)
  Chronic Care Management   Outreach Note  05/11/2019 Name: Kathryn Walsh MRN: [id number] DOB: 1956-06-19  Referred by: Glendale Chard, MD Reason for referral : Chronic Care Management   An unsuccessful telephone outreach was attempted today. The patient was referred to the case management team by for assistance with chronic care management and care coordination. Discussed case with CCM embedded social worker, Daneen Schick.  Follow Up Plan: The care management team will reach out to the patient again over the next 3-5 business days.    Regina Eck, PharmD, BCPS Clinical Pharmacist, [facility] [place]: [phone]

## 2019-05-13 ENCOUNTER — Telehealth: Payer: Self-pay

## 2019-05-14 ENCOUNTER — Ambulatory Visit: Payer: Self-pay | Admitting: Pharmacist

## 2019-05-14 ENCOUNTER — Other Ambulatory Visit: Payer: Self-pay

## 2019-05-14 DIAGNOSIS — E1165 Type 2 diabetes mellitus with hyperglycemia: Secondary | ICD-10-CM

## 2019-05-14 DIAGNOSIS — I10 Essential (primary) hypertension: Secondary | ICD-10-CM

## 2019-05-14 MED ORDER — AMLODIPINE BESYLATE 10 MG PO TABS: 10.0000 mg | ORAL_TABLET | Freq: Every day | ORAL | 3 refills | Status: DC

## 2019-05-14 MED ORDER — TOUJEO MAX SOLOSTAR 300 UNIT/ML ~~LOC~~ SOPN: 42.0000 [IU] | PEN_INJECTOR | Freq: Every day | SUBCUTANEOUS | 2 refills | Status: DC

## 2019-05-15 ENCOUNTER — Telehealth: Payer: Self-pay | Admitting: Internal Medicine

## 2019-05-15 NOTE — Telephone Encounter (Signed)
I called the patient to schedule an earlier AWV with Nicke[place].  There was no answer and no option to leave a message. VDM (DD)

## 2019-05-16 NOTE — Progress Notes (Signed)
Chronic Care Management   Visit Note  05/14/2019 Name: Graclynn Vanantwerp MRN: [id number] DOB: 07-Mar-1956  Referred by: Glendale Chard, MD Reason for referral : Chronic Care Management   Kathryn Walsh is a 63 y.o. year old female who is a primary care patient of Glendale Chard, MD. The CCM team was consulted for assistance with chronic disease management and care coordination needs.   Review of patient status, including review of consultants reports, relevant laboratory and other test results, and collaboration with appropriate care team members and the patient's provider was performed as part of comprehensive patient evaluation and provision of chronic care management services.    I spoke with [name] by telephone today.  Objective:   Goals Addressed            This Visit's Progress     Patient Stated   . I want to apply for financial assistance for my insulins (pt-stated)       Current Barriers:  . Non Adherence to prescribed medication regimen . Financial Barriers  Pharmacist Clinical Goal(s):  [name] Over the next 60 days, patient will work with PharmD to address needs related to applying for medication assistance  Interventions: [name] Medication assistance applications mailed to patient for Toujeo and Humalog, however it appears that patient has LIS/extra help with prescription drug programs.  Given this information, patient does not qualify for medication assistance programs.  Refills will now need to be called in for 90-days=$8.90 for brand name copays and 90 days=$3.40 for generics. . Patient states that her blood sugars are "coming down", but does not elaborate on numbers.  She denies hypoglycemia.  Patient uses an AccuCheck glucometer and needs test strip refills (will send CMA message for refill).  Will continue to work with patient on diabetes control (see other patient goal). . Patient's last A1c 10.5 on 04/07/19 . Will continue to follow patient goal to ensure copays  are affordable to aid in medication compliance.  Patient Self Care Activities:  . Attends all scheduled provider appointments . Calls pharmacy for medication refills . Performs ADL's independently  Please see past updates related to this goal by clicking on the "Past Updates" button in the selected goal      . I would like to control my diabetes (pt-stated)       Current Barriers:  [name] Knowledge Deficits related to disease state management, Non Adherence to prescribed medication regimen, and Financial Barriers  Pharmacist Clinical Goal(s):  [name] Over the next 90 days, patient will work with PharmD & PCP to address needs related to optimized medication management of chronic conditions  Interventions: . Comprehensive medication review performed.  Reviewed medication fill history via insurance claims data confirming patient appears compliant with having his medications filled on time as prescribed by provider. . Reviewed & discussed the following diabetes-related information with patient: o Continue checking blood sugars as directed o Follow ADA recommended "diabetes-friendly" diet  (reviewed healthy snack/food options) o Confirmed current DM regimen: Toujeo 42 units nightly. Humalog sliding scale with meals (0-15units)  Could consider GLP-1 to optimize regimen and compliance.  Will continue to address. o Discussed insulin injection technique; Patient uses AccuCheck glucometer-->needs test strip refills (will send CMA message for refill).  o Reviewed medication purpose/side effects-->patient denies adverse events, denies hypoglycemia, reports states that her blood sugars are "coming down", but does not elaborate on numbers. Reminded patient of goal and encouraged her to continue taking medications as prescribed, o Most recent A1c is 10.5 on 04/07/19 .  Reviewed dispense history-->patient appears compliant with medication fills.  She is currently taking Toujeo 42 units nightly.  She had been running  out of pens early due to new RX needed for increased dose.  New RX for Toujeo has been sent to pharmacy.  Patient states she will pick up tomorrow. . Statin in diabetes --> patient has been taking her statin as prescribed.  New RX called in on 05/14/19.  Will update next month for 90-day supply.  Patient states she does miss doses "every now and then", but she actually feels worse when she misses statin.  Encouraged patient to continue taking statin and pick up medication tomorrow when she gets her Toujeo. . Will discuss patient case with CCM RN CM, Angel Little, to assist in keeping patient engaged with diabetes management.  Patient Self Care Activities:  . Self administers medications as prescribed . Attends all scheduled provider appointments . Calls pharmacy for medication refills . Performs ADL's independently . Performs IADL's independently . Calls provider office for new concerns or questions  Initial goal documentation         Plan:   The care management team will reach out to the patient again over the next 3 weeks.  Regina Eck, PharmD, BCPS Clinical Pharmacist, [facility] [place]: [phone]

## 2019-05-16 NOTE — Patient Instructions (Signed)
Visit Information  Goals Addressed            This Visit's Progress     Patient Stated   . I want to apply for financial assistance for my insulins (pt-stated)       Current Barriers:  . Non Adherence to prescribed medication regimen . Financial Barriers  Pharmacist Clinical Goal(s):  [name] Over the next 60 days, patient will work with PharmD to address needs related to applying for medication assistance  Interventions: [name] Medication assistance applications mailed to patient for Toujeo and Humalog, however it appears that patient has LIS/extra help with prescription drug programs.  Given this information, patient does not qualify for medication assistance programs.  Refills will now need to be called in for 90-days=$8.90 for brand name copays and 90 days=$3.40 for generics. . Patient states that her blood sugars are "coming down", but does not elaborate on numbers.  She denies hypoglycemia.  Patient uses an AccuCheck glucometer and needs test strip refills (will send CMA message for refill).  Will continue to work with patient on diabetes control (see other patient goal). . Patient's last A1c 10.5 on 04/07/19 . Will continue to follow patient goal to ensure copays are affordable to aid in medication compliance.   CCM SW Interventions: Completed 05/07/19 with the patient . Outbound call to the patient to conduct SDOH screening call . Informed by the patient she has yet to complete the medication assistance applications due to not being able to locate her current social security award letter . "I guess I will just keep doing what I am doing" . Advised the patient CCM SW would communicate with embedded PharmD [name] to update why the patient has yet to complete application  . Collaboration with [name] PharmD via in basket message to communicate patient stated reasoning for not completing the application at this time  Patient Self Care Activities:  . Attends all scheduled provider  appointments . Calls pharmacy for medication refills . Performs ADL's independently  Please see past updates related to this goal by clicking on the "Past Updates" button in the selected goal      . I would like to control my diabetes (pt-stated)       Current Barriers:  [name] Knowledge Deficits related to disease state management, Non Adherence to prescribed medication regimen, and Financial Barriers  Pharmacist Clinical Goal(s):  [name] Over the next 90 days, patient will work with PharmD & PCP to address needs related to optimized medication management of chronic conditions  Interventions: . Comprehensive medication review performed.  Reviewed medication fill history via insurance claims data confirming patient appears compliant with having his medications filled on time as prescribed by provider. . Reviewed & discussed the following diabetes-related information with patient: o Continue checking blood sugars as directed o Follow ADA recommended "diabetes-friendly" diet  (reviewed healthy snack/food options) o Confirmed current DM regimen: Toujeo 42 units nightly. Humalog sliding scale with meals (0-15units)  Could consider GLP-1 to optimize regimen and compliance.  Will continue to address. o Discussed insulin injection technique; Patient uses AccuCheck glucometer-->needs test strip refills (will send CMA message for refill).  o Reviewed medication purpose/side effects-->patient denies adverse events, denies hypoglycemia, reports states that her blood sugars are "coming down", but does not elaborate on numbers. Reminded patient of goal and encouraged her to continue taking medications as prescribed, o Most recent A1c is 10.5 on 04/07/19 . Reviewed dispense history-->patient appears compliant with medication fills.  She is currently  taking Toujeo 42 units nightly.  She had been running out of pens early due to new RX needed for increased dose.  New RX for Toujeo has been sent to pharmacy.  Patient  states she will pick up tomorrow. . Statin in diabetes --> patient has been taking her statin as prescribed.  New RX called in on 05/14/19.  Will update next month for 90-day supply.  Patient states she does miss doses "every now and then", but she actually feels worse when she misses statin.  Encouraged patient to continue taking statin and pick up medication tomorrow when she gets her Toujeo. . Will discuss patient case with CCM RN CM, Angel Little, to assist in keeping patient engaged with diabetes management.  Patient Self Care Activities:  . Self administers medications as prescribed . Attends all scheduled provider appointments . Calls pharmacy for medication refills . Performs ADL's independently . Performs IADL's independently . Calls provider office for new concerns or questions  Initial goal documentation        The patient verbalized understanding of instructions provided today and declined a print copy of patient instruction materials.   The care management team will reach out to the patient again over the next 3 weeks.  Regina Eck, PharmD, BCPS Clinical Pharmacist, [facility] [place]: [phone]

## 2019-05-20 ENCOUNTER — Telehealth: Payer: Self-pay

## 2019-05-29 ENCOUNTER — Ambulatory Visit: Payer: Self-pay | Admitting: Pharmacist

## 2019-05-29 NOTE — Progress Notes (Signed)
  Chronic Care Management   Outreach Note  05/29/2019 Name: Kathryn Walsh MRN: [id number] DOB: 12-Dec-1955  Referred by: Glendale Chard, MD Reason for referral : Chronic Care Management   An unsuccessful telephone outreach was attempted today. The patient was referred to the case management team by for assistance with chronic care management and care coordination.   Follow Up Plan: The care management team will reach out to the patient again over the next 10-14 business days.  No voicemail has been set up, therefore unable to leave message.   Regina Eck, PharmD, BCPS Clinical Pharmacist, [facility] [place]: [phone]

## 2019-06-04 ENCOUNTER — Telehealth: Payer: Self-pay

## 2019-06-06 ENCOUNTER — Other Ambulatory Visit: Payer: Self-pay | Admitting: Internal Medicine

## 2019-06-12 ENCOUNTER — Ambulatory Visit (INDEPENDENT_AMBULATORY_CARE_PROVIDER_SITE_OTHER): Payer: Medicare Other | Admitting: Pharmacist

## 2019-06-12 DIAGNOSIS — E1165 Type 2 diabetes mellitus with hyperglycemia: Secondary | ICD-10-CM

## 2019-06-12 DIAGNOSIS — I10 Essential (primary) hypertension: Secondary | ICD-10-CM | POA: Diagnosis not present

## 2019-06-16 NOTE — Patient Instructions (Signed)
Visit Information  Goals Addressed            This Visit's Progress     Patient Stated   . COMPLETED: I want to apply for financial assistance for my insulins (pt-stated)       Current Barriers:  . Non Adherence to prescribed medication regimen . Financial Barriers  Pharmacist Clinical Goal(s):  [name] Over the next 60 days, patient will work with PharmD to address needs related to applying for medication assistance  Interventions: 06/12/19 call completed with patient  . Medication assistance applications mailed to patient for Toujeo and Humalog, however it appears that patient has LIS/extra help with prescription drug programs.  Given this information, patient does not qualify for medication assistance programs.  Refills will now need to be called in for 90-days=$8.90 for brand name copays and 90 days=$3.40 for generics.  Patient verbalizes understanding and was able to pick up all refills for this month. . Patient states that her blood sugars are "coming down", but does not elaborate on numbers.  She denies hypoglycemia.  Patient uses an AccuCheck glucometer and needs test strip refills (refill requested).  Will continue to work with patient on diabetes control (see other patient goal). . Patient's last A1c 10.5 on 04/07/19 . Will continue to follow patient goal to ensure copays are affordable to aid in medication compliance.   CCM SW Interventions: Completed 05/07/19 with the patient . Outbound call to the patient to conduct SDOH screening call . Informed by the patient she has yet to complete the medication assistance applications due to not being able to locate her current social security award letter . "I guess I will just keep doing what I am doing" . Advised the patient CCM SW would communicate with embedded PharmD [name] to update why the patient has yet to complete application  . Collaboration with [name] PharmD via in basket message to communicate patient stated reasoning  for not completing the application at this time  Patient Self Care Activities:  . Attends all scheduled provider appointments . Calls pharmacy for medication refills . Performs ADL's independently  Please see past updates related to this goal by clicking on the "Past Updates" button in the selected goal      . I would like to control my diabetes (pt-stated)       Current Barriers:  [name] Knowledge Deficits related to disease state management, Non Adherence to prescribed medication regimen, and Financial Barriers  Pharmacist Clinical Goal(s):  [name] Over the next 90 days, patient will work with PharmD & PCP to address needs related to optimized medication management of chronic conditions  Interventions: . Comprehensive medication review performed.  Reviewed medication fill history via insurance claims data confirming patient appears compliant with having her medications filled on time as prescribed by provider. . Reviewed & discussed the following diabetes-related information with patient: o Continue checking blood sugars as directed o Follow ADA recommended "diabetes-friendly" diet  (reviewed healthy snack/food options) o Confirmed current DM regimen: Toujeo 42 units nightly. Humalog sliding scale with meals (0-15units)  Could consider GLP-1 to optimize regimen and compliance.  Will continue to address. o Discussed insulin injection technique; Patient uses AccuCheck glucometer-->needs test strip refills (patient was able to pick up refill).  o Reviewed medication purpose/side effects-->patient denies adverse events, denies hypoglycemia, reports states that her blood sugars are "coming down", but does not elaborate on numbers. Reminded patient of goal and encouraged her to continue taking medications as prescribed, o Most  recent A1c is 10.5 on 04/07/19 (continues to be elevated 10-11% this year) . Reviewed dispense history-->patient appears compliant with medication fills.  She continues to take  Toujeo 42 units nightly.  She had been running out of pens early due to new RX needed for increased dose.  New RX for Toujeo has been sent to pharmacy.  Patient has picked up and reports compliance with insulin. . Statin in diabetes --> patient has been taking her statin as prescribed.  New RX called in for statin 90 day supply on 06/08/19.  Patient states she does miss doses "every now and then", but she actually feels worse when she misses statin.  Encouraged patient to continue taking statin as there are great benefits for its use in diabetes patients . Will discuss patient case with CCM RN CM, Angel Little, to assist in keeping patient engaged with diabetes management.  Patient Self Care Activities:  . Self administers medications as prescribed . Attends all scheduled provider appointments . Calls pharmacy for medication refills . Performs ADL's independently . Performs IADL's independently . Calls provider office for new concerns or questions  Please see past updates related to this goal by clicking on the "Past Updates" button in the selected goal         The patient verbalized understanding of instructions provided today and declined a print copy of patient instruction materials.   The care management team will reach out to the patient again over the next 3 weeks.  Regina Eck, PharmD, BCPS Clinical Pharmacist, [facility] [place]: [phone]

## 2019-06-16 NOTE — Progress Notes (Signed)
Chronic Care Management   Visit Note  06/12/2019 Name: Kathryn Walsh MRN: [id number] DOB: 1956-07-23  Referred by: Glendale Chard, MD Reason for referral : Chronic Care Management   Kathryn Walsh is a 63 y.o. year old female who is a primary care patient of Glendale Chard, MD. The CCM team was consulted for assistance with chronic disease management and care coordination needs.   Review of patient status, including review of consultants reports, relevant laboratory and other test results, and collaboration with appropriate care team members and the patient's provider was performed as part of comprehensive patient evaluation and provision of chronic care management services.    I spoke with Kathryn Walsh by telephone today,  Medications: Outpatient Encounter Medications as of 06/12/2019  Medication Sig Note  . amLODipine (NORVASC) 10 MG tablet Take 1 tablet (10 mg total) by mouth daily. (Patient taking differently: Take 5 mg by mouth daily. )   . aspirin 81 MG chewable tablet Chew 81 mg by mouth daily.   [name] azaTHIOprine (IMURAN) 50 MG tablet Take 100 mg by mouth 2 (two) times daily.    . Blood Glucose Monitoring Suppl (ACCU-CHEK AVIVA PLUS) w/Device KIT Use as directed to check blood sugars 3 times per day dx:e11.65   . Cholecalciferol (VITAMIN D HIGH POTENCY) 25 MCG (1000 UT) capsule Take 4,000 Units by mouth daily.   . cyclobenzaprine (FLEXERIL) 10 MG tablet One tab po qhs pr   . Insulin Glargine, 2 Unit Dial, (TOUJEO MAX SOLOSTAR) 300 UNIT/ML SOPN Inject 42 Units into the skin at bedtime. (Patient taking differently: Inject 32 Units into the skin at bedtime. )   . insulin lispro (HUMALOG KWIKPEN) 100 UNIT/ML KiwkPen Inject 0-15 Units into the skin 3 (three) times daily as needed (high blood sugar). 08/22/2017: Pt last dose was 8 units.  . Multiple Vitamin (MULTIVITAMIN WITH MINERALS) TABS tablet Take 1 tablet by mouth daily.   . pravastatin (PRAVACHOL) 40 MG tablet TAKE 1 TABLET BY  MOUTH EVERYDAY AT BEDTIME   . propranolol (INDERAL) 20 MG tablet Take 20 mg by mouth 2 (two) times daily.   . tacrolimus (PROGRAF) 1 MG capsule Take 2 mg in am and 3 mg in pm    No facility-administered encounter medications on file as of 06/12/2019.      Objective:   Goals Addressed            This Visit's Progress     Patient Stated   . COMPLETED: I want to apply for financial assistance for my insulins (pt-stated)       Current Barriers:  . Non Adherence to prescribed medication regimen . Financial Barriers  Pharmacist Clinical Goal(s):  [name] Over the next 60 days, patient will work with PharmD to address needs related to applying for medication assistance  Interventions: 06/12/19 call completed with patient  . Medication assistance applications mailed to patient for Toujeo and Humalog, however it appears that patient has LIS/extra help with prescription drug programs.  Given this information, patient does not qualify for medication assistance programs.  Refills will now need to be called in for 90-days=$8.90 for brand name copays and 90 days=$3.40 for generics.  Patient verbalizes understanding and was able to pick up all refills for this month. . Patient states that her blood sugars are "coming down", but does not elaborate on numbers.  She denies hypoglycemia.  Patient uses an AccuCheck glucometer and needs test strip refills (refill requested).  Will continue to work with patient on diabetes control (  see other patient goal). . Patient's last A1c 10.5 on 04/07/19 . Will continue to follow patient goal to ensure copays are affordable to aid in medication compliance.   CCM SW Interventions: Completed 05/07/19 with the patient . Outbound call to the patient to conduct SDOH screening call . Informed by the patient she has yet to complete the medication assistance applications due to not being able to locate her current social security award letter . "I guess I will just keep doing what  I am doing" . Advised the patient CCM SW would communicate with embedded PharmD [name] to update why the patient has yet to complete application  . Collaboration with [name] PharmD via in basket message to communicate patient stated reasoning for not completing the application at this time  Patient Self Care Activities:  . Attends all scheduled provider appointments . Calls pharmacy for medication refills . Performs ADL's independently  Please see past updates related to this goal by clicking on the "Past Updates" button in the selected goal      . I would like to control my diabetes (pt-stated)       Current Barriers:  [name] Knowledge Deficits related to disease state management, Non Adherence to prescribed medication regimen, and Financial Barriers  Pharmacist Clinical Goal(s):  [name] Over the next 90 days, patient will work with PharmD & PCP to address needs related to optimized medication management of chronic conditions  Interventions: . Comprehensive medication review performed.  Reviewed medication fill history via insurance claims data confirming patient appears compliant with having her medications filled on time as prescribed by provider. . Reviewed & discussed the following diabetes-related information with patient: o Continue checking blood sugars as directed o Follow ADA recommended "diabetes-friendly" diet  (reviewed healthy snack/food options) o Confirmed current DM regimen: Toujeo 42 units nightly. Humalog sliding scale with meals (0-15units)  Could consider GLP-1 to optimize regimen and compliance.  Will continue to address. o Discussed insulin injection technique; Patient uses AccuCheck glucometer-->needs test strip refills (patient was able to pick up refill).  o Reviewed medication purpose/side effects-->patient denies adverse events, denies hypoglycemia, reports states that her blood sugars are "coming down", but does not elaborate on numbers. Reminded patient of  goal and encouraged her to continue taking medications as prescribed, o Most recent A1c is 10.5 on 04/07/19 (continues to be elevated 10-11% this year) . Reviewed dispense history-->patient appears compliant with medication fills.  She continues to take Toujeo 42 units nightly.  She had been running out of pens early due to new RX needed for increased dose.  New RX for Toujeo has been sent to pharmacy.  Patient has picked up and reports compliance with insulin. . Statin in diabetes --> patient has been taking her statin as prescribed.  New RX called in for statin 90 day supply on 06/08/19.  Patient states she does miss doses "every now and then", but she actually feels worse when she misses statin.  Encouraged patient to continue taking statin as there are great benefits for its use in diabetes patients . Will discuss patient case with CCM RN CM, Angel Little, to assist in keeping patient engaged with diabetes management.  Patient Self Care Activities:  . Self administers medications as prescribed . Attends all scheduled provider appointments . Calls pharmacy for medication refills . Performs ADL's independently . Performs IADL's independently . Calls provider office for new concerns or questions  Please see past updates related to this goal by clicking on  the "Past Updates" button in the selected goal         Plan:   The care management team will reach out to the patient again over the next 3 weeks.  Regina Eck, PharmD, BCPS Clinical Pharmacist, [facility] [place]: [phone]

## 2019-06-18 ENCOUNTER — Other Ambulatory Visit: Payer: Self-pay

## 2019-06-18 DIAGNOSIS — E2839 Other primary ovarian failure: Secondary | ICD-10-CM

## 2019-06-27 ENCOUNTER — Other Ambulatory Visit: Payer: Self-pay | Admitting: Internal Medicine

## 2019-07-01 ENCOUNTER — Telehealth: Payer: Self-pay

## 2019-07-02 ENCOUNTER — Other Ambulatory Visit: Payer: Self-pay

## 2019-07-02 ENCOUNTER — Ambulatory Visit (INDEPENDENT_AMBULATORY_CARE_PROVIDER_SITE_OTHER): Payer: Medicare Other

## 2019-07-02 ENCOUNTER — Encounter: Payer: Self-pay | Admitting: Internal Medicine

## 2019-07-02 ENCOUNTER — Ambulatory Visit (INDEPENDENT_AMBULATORY_CARE_PROVIDER_SITE_OTHER): Payer: Medicare Other | Admitting: Internal Medicine

## 2019-07-02 VITALS — BP 138/70 | HR 82 | Temp 98.8°F | Ht 67.0 in | Wt 170.4 lb

## 2019-07-02 DIAGNOSIS — Z944 Liver transplant status: Secondary | ICD-10-CM | POA: Diagnosis not present

## 2019-07-02 DIAGNOSIS — E1165 Type 2 diabetes mellitus with hyperglycemia: Secondary | ICD-10-CM | POA: Diagnosis not present

## 2019-07-02 DIAGNOSIS — Z0000 Encounter for general adult medical examination without abnormal findings: Secondary | ICD-10-CM | POA: Diagnosis not present

## 2019-07-02 DIAGNOSIS — Z23 Encounter for immunization: Secondary | ICD-10-CM

## 2019-07-02 DIAGNOSIS — Z716 Tobacco abuse counseling: Secondary | ICD-10-CM

## 2019-07-02 DIAGNOSIS — D849 Immunodeficiency, unspecified: Secondary | ICD-10-CM

## 2019-07-02 DIAGNOSIS — I10 Essential (primary) hypertension: Secondary | ICD-10-CM

## 2019-07-02 MED ORDER — HUMALOG KWIKPEN 200 UNIT/ML ~~LOC~~ SOPN: 6.0000 [IU] | PEN_INJECTOR | Freq: Two times a day (BID) | SUBCUTANEOUS | 5 refills | Status: DC

## 2019-07-02 MED ORDER — CYCLOBENZAPRINE HCL 10 MG PO TABS: ORAL_TABLET | ORAL | 0 refills | Status: DC

## 2019-07-02 NOTE — Progress Notes (Signed)
Subjective:     Patient ID: Kathryn Walsh , female    DOB: 1955-12-13 , 63 y.o.   MRN: [id number]   Chief Complaint  Patient presents with  . Diabetes  . Hypertension    HPI  Diabetes She presents for her follow-up diabetic visit. She has type 2 diabetes mellitus. Her disease course has been improving. There are no hypoglycemic associated symptoms. Pertinent negatives for diabetes include no blurred vision and no chest pain. There are no hypoglycemic complications. Risk factors for coronary artery disease include diabetes mellitus, dyslipidemia, hypertension, sedentary lifestyle and post-menopausal. She is compliant with treatment some of the time. Her weight is fluctuating minimally. She is following a generally healthy diet. Her home blood glucose trend is fluctuating minimally. Her breakfast blood glucose is taken between 8-9 am. Her breakfast blood glucose range is generally 140-180 mg/dl.  Hypertension This is a chronic problem. The current episode started more than 1 month ago. The problem has been gradually improving since onset. Pertinent negatives include no blurred vision, chest pain, palpitations or shortness of breath. Past treatments include calcium channel blockers and beta blockers. The current treatment provides moderate improvement. Compliance problems include exercise.      Past Medical History:  Diagnosis Date  . Diabetes mellitus without complication ([place])   . High cholesterol   . HTN (hypertension)   . Liver transplant recipient [facility])      Family History  Problem Relation Age of Onset  . Healthy Mother   . Healthy Father      Current Outpatient Medications:  .  amLODipine (NORVASC) 10 MG tablet, Take 1 tablet (10 mg total) by mouth daily. (Patient taking differently: Take 5 mg by mouth daily. ), Disp: 90 tablet, Rfl: 3 .  amLODipine (NORVASC) 5 MG tablet, TAKE 1 TABLET BY MOUTH EVERY DAY, Disp: 90 tablet, Rfl: 2 .  aspirin 81 MG chewable tablet, Chew 81  mg by mouth daily., Disp: , Rfl:  .  azaTHIOprine (IMURAN) 50 MG tablet, Take 100 mg by mouth 2 (two) times daily. , Disp: , Rfl:  .  Blood Glucose Monitoring Suppl (ACCU-CHEK AVIVA PLUS) w/Device KIT, Use as directed to check blood sugars 3 times per day dx:e11.65, Disp: 1 kit, Rfl: 1 .  Cholecalciferol (VITAMIN D HIGH POTENCY) 25 MCG (1000 UT) capsule, Take 4,000 Units by mouth daily., Disp: , Rfl:  .  cyclobenzaprine (FLEXERIL) 10 MG tablet, One tab po qhs pr, Disp: 30 tablet, Rfl: 0 .  Insulin Glargine, 2 Unit Dial, (TOUJEO MAX SOLOSTAR) 300 UNIT/ML SOPN, Inject 42 Units into the skin at bedtime. (Patient taking differently: Inject 32 Units into the skin at bedtime. ), Disp: 5 pen, Rfl: 2 .  Insulin Lispro (HUMALOG KWIKPEN) 200 UNIT/ML SOPN, Inject 6 Units into the skin 2 (two) times daily before a meal. Please inject 6 units before breakfast and dinner; max dose titration 40 units daily, Disp: 5 pen, Rfl: 5 .  Multiple Vitamin (MULTIVITAMIN WITH MINERALS) TABS tablet, Take 1 tablet by mouth daily., Disp: , Rfl:  .  pravastatin (PRAVACHOL) 40 MG tablet, TAKE 1 TABLET BY MOUTH EVERYDAY AT BEDTIME, Disp: 90 tablet, Rfl: 1 .  propranolol (INDERAL) 20 MG tablet, Take 20 mg by mouth 2 (two) times daily., Disp: , Rfl:  .  tacrolimus (PROGRAF) 1 MG capsule, Take 2 mg in am and 3 mg in pm, Disp: , Rfl:  .  Turmeric 500 MG TABS, Take by mouth., Disp: , Rfl:    No  Known Allergies   Review of Systems  Constitutional: Negative.   Eyes: Negative for blurred vision.  Respiratory: Negative.  Negative for shortness of breath.   Cardiovascular: Negative.  Negative for chest pain and palpitations.  Gastrointestinal: Negative.   Neurological: Negative.   Psychiatric/Behavioral: Negative.      Today's Vitals   07/02/19 1052  BP: 138/70  Pulse: 82  Temp: 98.8 F (37.1 C)  TempSrc: Oral  SpO2: 97%  Weight: 170 lb 6.4 oz (77.3 kg)  Height: '5\' 7"'  (1.702 m)   Body mass index is 26.69 kg/m.    Objective:  Physical Exam Vitals signs and nursing note reviewed.  Constitutional:      Appearance: Normal appearance.  HENT:     Head: Normocephalic and atraumatic.  Cardiovascular:     Rate and Rhythm: Normal rate and regular rhythm.     Heart sounds: Normal heart sounds.  Pulmonary:     Effort: Pulmonary effort is normal.     Breath sounds: Normal breath sounds.  Skin:    General: Skin is warm.  Neurological:     General: No focal deficit present.     Mental Status: She is alert.  Psychiatric:        Mood and Affect: Mood normal.        Behavior: Behavior normal.         Assessment And Plan:     1. Uncontrolled type 2 diabetes mellitus with hyperglycemia (HCC)  Chronic. I will check labs as listed below. Importance of dietary compliance was discussed with the patient.   - BMP8+EGFR - Hemoglobin A1c  2. Essential hypertension  Chronic, fair control. Pt advised optimal bp is less than 130/80.  She is encouraged to avoid adding salt to her foods.   3. Immunosuppressed status ([place])  She is s/p liver transplant.   4. Encounter for smoking cessation counseling  Importance of smoking cessation was discussed with the patient for greater than 3 minutes. She is not interested in quitting at this time. She is encouraged to avoid smoking in her home and in her car.   Kathryn Greenland, MD    THE PATIENT IS ENCOURAGED TO PRACTICE SOCIAL DISTANCING DUE TO THE COVID-19 PANDEMIC.

## 2019-07-02 NOTE — Patient Instructions (Addendum)
Kathryn Walsh , Thank you for taking time to come for your Medicare Wellness Visit. I appreciate your ongoing commitment to your health goals. Please review the following plan we discussed and let me know if I can assist you in the future.   Screening recommendations/referrals: Colonoscopy: 11/2018 Mammogram: 12/2017 Bone Density: 05/2014 Recommended yearly ophthalmology/optometry visit for glaucoma screening and checkup Recommended yearly dental visit for hygiene and checkup  Vaccinations: Influenza vaccine: today Pneumococcal vaccine: 11/2018 Tdap vaccine: 05/2012 Shingles vaccine: discussed    Advanced directives: Please bring a copy of your POA (Power of [place]) and/or Living Will to your next appointment.    Conditions/risks identified: overweight  Next appointment: 04/12/2020 at 9:30  Preventive Care 40-64 Years, Female Preventive care refers to lifestyle choices and visits with your health care provider that can promote health and wellness. What does preventive care include?  A yearly physical exam. This is also called an annual well check.  Dental exams once or twice a year.  Routine eye exams. Ask your health care provider how often you should have your eyes checked.  Personal lifestyle choices, including:  Daily care of your teeth and gums.  Regular physical activity.  Eating a healthy diet.  Avoiding tobacco and drug use.  Limiting alcohol use.  Practicing safe sex.  Taking low-dose aspirin daily starting at age 22.  Taking vitamin and mineral supplements as recommended by your health care provider. What happens during an annual well check? The services and screenings done by your health care provider during your annual well check will depend on your age, overall health, lifestyle risk factors, and family history of disease. Counseling  Your health care provider may ask you questions about your:  Alcohol use.  Tobacco use.  Drug use.  Emotional  well-being.  Home and relationship well-being.  Sexual activity.  Eating habits.  Work and work Statistician.  Method of birth control.  Menstrual cycle.  Pregnancy history. Screening  You may have the following tests or measurements:  Height, weight, and BMI.  Blood pressure.  Lipid and cholesterol levels. These may be checked every 5 years, or more frequently if you are over 66 years old.  Skin check.  Lung cancer screening. You may have this screening every year starting at age 34 if you have a 30-pack-year history of smoking and currently smoke or have quit within the past 15 years.  Fecal occult blood test (FOBT) of the stool. You may have this test every year starting at age 65.  Flexible sigmoidoscopy or colonoscopy. You may have a sigmoidoscopy every 5 years or a colonoscopy every 10 years starting at age 87.  Hepatitis C blood test.  Hepatitis B blood test.  Sexually transmitted disease (STD) testing.  Diabetes screening. This is done by checking your blood sugar (glucose) after you have not eaten for a while (fasting). You may have this done every 1-3 years.  Mammogram. This may be done every 1-2 years. Talk to your health care provider about when you should start having regular mammograms. This may depend on whether you have a family history of breast cancer.  BRCA-related cancer screening. This may be done if you have a family history of breast, ovarian, tubal, or peritoneal cancers.  Pelvic exam and Pap test. This may be done every 3 years starting at age 17. Starting at age 26, this may be done every 5 years if you have a Pap test in combination with an HPV test.  Bone density scan.  This is done to screen for osteoporosis. You may have this scan if you are at high risk for osteoporosis. Discuss your test results, treatment options, and if necessary, the need for more tests with your health care provider. Vaccines  Your health care provider may recommend  certain vaccines, such as:  Influenza vaccine. This is recommended every year.  Tetanus, diphtheria, and acellular pertussis (Tdap, Td) vaccine. You may need a Td booster every 10 years.  Zoster vaccine. You may need this after age 26.  Pneumococcal 13-valent conjugate (PCV13) vaccine. You may need this if you have certain conditions and were not previously vaccinated.  Pneumococcal polysaccharide (PPSV23) vaccine. You may need one or two doses if you smoke cigarettes or if you have certain conditions. Talk to your health care provider about which screenings and vaccines you need and how often you need them. This information is not intended to replace advice given to you by your health care provider. Make sure you discuss any questions you have with your health care provider. Document Released: 11/11/2015 Document Revised: 07/04/2016 Document Reviewed: 08/16/2015 Elsevier Interactive Patient Education  2017 [place] Prevention in the Home Falls can cause injuries. They can happen to people of all ages. There are many things you can do to make your home safe and to help prevent falls. What can I do on the outside of my home?  Regularly fix the edges of walkways and driveways and fix any cracks.  Remove anything that might make you trip as you walk through a door, such as a raised step or threshold.  Trim any bushes or trees on the path to your home.  Use bright outdoor lighting.  Clear any walking paths of anything that might make someone trip, such as rocks or tools.  Regularly check to see if handrails are loose or broken. Make sure that both sides of any steps have handrails.  Any raised decks and porches should have guardrails on the edges.  Have any leaves, snow, or ice cleared regularly.  Use sand or salt on walking paths during winter.  Clean up any spills in your garage right away. This includes oil or grease spills. What can I do in the bathroom?  Use  night lights.  Install grab bars by the toilet and in the tub and shower. Do not use towel bars as grab bars.  Use non-skid mats or decals in the tub or shower.  If you need to sit down in the shower, use a plastic, non-slip stool.  Keep the floor dry. Clean up any water that spills on the floor as soon as it happens.  Remove soap buildup in the tub or shower regularly.  Attach bath mats securely with double-sided non-slip rug tape.  Do not have throw rugs and other things on the floor that can make you trip. What can I do in the bedroom?  Use night lights.  Make sure that you have a light by your bed that is easy to reach.  Do not use any sheets or blankets that are too big for your bed. They should not hang down onto the floor.  Have a firm chair that has side arms. You can use this for support while you get dressed.  Do not have throw rugs and other things on the floor that can make you trip. What can I do in the kitchen?  Clean up any spills right away.  Avoid walking on wet floors.  Keep items that you use a lot in easy-to-reach places.  If you need to reach something above you, use a strong step stool that has a grab bar.  Keep electrical cords out of the way.  Do not use floor polish or wax that makes floors slippery. If you must use wax, use non-skid floor wax.  Do not have throw rugs and other things on the floor that can make you trip. What can I do with my stairs?  Do not leave any items on the stairs.  Make sure that there are handrails on both sides of the stairs and use them. Fix handrails that are broken or loose. Make sure that handrails are as long as the stairways.  Check any carpeting to make sure that it is firmly attached to the stairs. Fix any carpet that is loose or worn.  Avoid having throw rugs at the top or bottom of the stairs. If you do have throw rugs, attach them to the floor with carpet tape.  Make sure that you have a light switch at  the top of the stairs and the bottom of the stairs. If you do not have them, ask someone to add them for you. What else can I do to help prevent falls?  Wear shoes that:  Do not have high heels.  Have rubber bottoms.  Are comfortable and fit you well.  Are closed at the toe. Do not wear sandals.  If you use a stepladder:  Make sure that it is fully opened. Do not climb a closed stepladder.  Make sure that both sides of the stepladder are locked into place.  Ask someone to hold it for you, if possible.  Clearly mark and make sure that you can see:  Any grab bars or handrails.  First and last steps.  Where the edge of each step is.  Use tools that help you move around (mobility aids) if they are needed. These include:  Canes.  Walkers.  Scooters.  Crutches.  Turn on the lights when you go into a dark area. Replace any light bulbs as soon as they burn out.  Set up your furniture so you have a clear path. Avoid moving your furniture around.  If any of your floors are uneven, fix them.  If there are any pets around you, be aware of where they are.  Review your medicines with your doctor. Some medicines can make you feel dizzy. This can increase your chance of falling. Ask your doctor what other things that you can do to help prevent falls. This information is not intended to replace advice given to you by your health care provider. Make sure you discuss any questions you have with your health care provider. Document Released: 08/11/2009 Document Revised: 03/22/2016 Document Reviewed: 11/19/2014 Elsevier Interactive Patient Education  2017 [employer].

## 2019-07-02 NOTE — Progress Notes (Signed)
Subjective:   Zuri Lascala is a 63 y.o. female who presents for Medicare Annual (Subsequent) preventive examination.  Review of Systems:  n/a Cardiac Risk Factors include: diabetes mellitus;hypertension     Objective:     Vitals: BP 138/70 (BP Location: Left Arm, Patient Position: Sitting, Cuff Size: Normal)   Pulse 82   Temp 98.8 F (37.1 C) (Oral)   Ht '5\' 7"'  (1.702 m)   Wt 170 lb 6.4 oz (77.3 kg)   SpO2 97%   BMI 26.69 kg/m   Body mass index is 26.69 kg/m.  Advanced Directives 07/02/2019 08/22/2017 01/31/2017 10/09/2014 07/30/2013 07/11/2013  Does Patient Have a Medical Advance Directive? Yes Yes No No Patient has advance directive, copy not in chart Patient has advance directive, copy not in chart  Type of Advance Directive [place];Living will [place];Living will - - Living will Living will  Copy of [place] in Chart? No - copy requested - - - Copy requested from family Copy requested from family  Would patient like information on creating a medical advance directive? - - No - Patient declined No - patient declined information - -  Pre-existing out of facility DNR order (yellow form or pink MOST form) - - - - No No    Tobacco Social History   Tobacco Use  Smoking Status Current Some Day Smoker  . Packs/day: 0.25  . Years: 12.00  . Pack years: 3.00  . Types: Cigarettes  Smokeless Tobacco Never Used     Ready to quit: Yes Counseling given: Not Answered   Clinical Intake:  Pre-visit preparation completed: Yes  Pain : No/denies pain     Nutritional Status: BMI 25 -29 Overweight Nutritional Risks: None Diabetes: Yes CBG done?: No Did pt. bring in CBG monitor from home?: No  How often do you need to have someone help you when you read instructions, pamphlets, or other written materials from your doctor or pharmacy?: 1 - Never What is the last grade level you completed in school?: 2 years college   Interpreter Needed?: No  Information entered by :: NAllen LPN  Past Medical History:  Diagnosis Date  . Diabetes mellitus without complication ([place])   . High cholesterol   . HTN (hypertension)   . 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 LIVER TRANSPLANTATION     Family History  Problem Relation Age of Onset  . Healthy Mother   . Healthy Father    Social History   Socioeconomic History  . Marital status: Single    Spouse name: Not on file  . Number of children: Not on file  . Years of education: Not on file  . Highest education level: Not on file  Occupational History  . Occupation: employed  Scientific laboratory technician  . Financial resource strain: Not very hard  . Food insecurity    Worry: Never true    Inability: Never true  . Transportation needs    Medical: No    Non-medical: No  Tobacco Use  . Smoking status: Current Some Day Smoker    Packs/day: 0.25    Years: 12.00    Pack years: 3.00    Types: Cigarettes  . Smokeless tobacco: Never Used  Substance and Sexual Activity  . Alcohol use: No  . Drug use: No  . Sexual activity: Not Currently  Lifestyle  . Physical activity    Days per week: 7  days    Minutes per session: 30 min  . Stress: To some extent  Relationships  . Social Herbalist on phone: Not on file    Gets together: Not on file    Attends religious service: Not on file    Active member of club or organization: Not on file    Attends meetings of clubs or organizations: Not on file    Relationship status: Not on file  Other Topics Concern  . Not on file  Social History Narrative  . Not on file    Outpatient Encounter Medications as of 07/02/2019  Medication Sig  . amLODipine (NORVASC) 10 MG tablet Take 1 tablet (10 mg total) by mouth daily. (Patient taking differently: Take 5 mg by mouth daily. )  . amLODipine (NORVASC) 5 MG tablet TAKE 1 TABLET BY MOUTH EVERY DAY  . aspirin 81 MG chewable  tablet Chew 81 mg by mouth daily.  [name] azaTHIOprine (IMURAN) 50 MG tablet Take 100 mg by mouth 2 (two) times daily.   . Blood Glucose Monitoring Suppl (ACCU-CHEK AVIVA PLUS) w/Device KIT Use as directed to check blood sugars 3 times per day dx:e11.65  . Cholecalciferol (VITAMIN D HIGH POTENCY) 25 MCG (1000 UT) capsule Take 4,000 Units by mouth daily.  . cyclobenzaprine (FLEXERIL) 10 MG tablet One tab po qhs pr  . Insulin Glargine, 2 Unit Dial, (TOUJEO MAX SOLOSTAR) 300 UNIT/ML SOPN Inject 42 Units into the skin at bedtime. (Patient taking differently: Inject 32 Units into the skin at bedtime. )  . insulin lispro (HUMALOG KWIKPEN) 100 UNIT/ML KiwkPen Inject 0-15 Units into the skin 3 (three) times daily as needed (high blood sugar).  . pravastatin (PRAVACHOL) 40 MG tablet TAKE 1 TABLET BY MOUTH EVERYDAY AT BEDTIME  . propranolol (INDERAL) 20 MG tablet Take 20 mg by mouth 2 (two) times daily.  . tacrolimus (PROGRAF) 1 MG capsule Take 2 mg in am and 3 mg in pm  . Turmeric 500 MG TABS Take by mouth.  . Multiple Vitamin (MULTIVITAMIN WITH MINERALS) TABS tablet Take 1 tablet by mouth daily.  . [DISCONTINUED] amLODipine (NORVASC) 10 MG tablet TAKE 1 TABLET BY MOUTH DAILY. (Patient taking differently: TAKE 5 MG TABLET BY MOUTH DAILY.)  . [DISCONTINUED] Insulin Glargine, 2 Unit Dial, (TOUJEO MAX SOLOSTAR) 300 UNIT/ML SOPN INJECT 20 UNITS INTO THE SKIN AT BEDTIME PER INSULIN PROTOCOL (Patient taking differently: 32 Units. )   No facility-administered encounter medications on file as of 07/02/2019.     Activities of Daily Living In your present state of health, do you have any difficulty performing the following activities: 07/02/2019  Hearing? N  Vision? Y  Comment blurry vision feels due to diabetes  Difficulty concentrating or making decisions? N  Walking or climbing stairs? N  Dressing or bathing? N  Doing errands, shopping? N  Preparing Food and eating ? N  Using the Toilet? N  In the past six  months, have you accidently leaked urine? N  Do you have problems with loss of bowel control? N  Managing your Medications? N  Managing your Finances? N  Housekeeping or managing your Housekeeping? N  Some recent data might be hidden    Patient Care Team: Glendale Chard, MD as PCP - General (Internal Medicine) Lavera Guise, [facility] (Pharmacist)    Assessment:   This is a routine wellness examination for [place].  Exercise Activities and Dietary recommendations Current Exercise Habits: Home exercise routine, Type of exercise: walking, Time (  Minutes): 30, Frequency (Times/Week): 7, Weekly Exercise (Minutes/Week): 210, Intensity: Mild  Goals    . I would like to control my diabetes (pt-stated)     Current Barriers:  [name] Knowledge Deficits related to disease state management, Non Adherence to prescribed medication regimen, and Financial Barriers  Pharmacist Clinical Goal(s):  [name] Over the next 90 days, patient will work with PharmD & PCP to address needs related to optimized medication management of chronic conditions  Interventions: . Comprehensive medication review performed.  Reviewed medication fill history via insurance claims data confirming patient appears compliant with having her medications filled on time as prescribed by provider. . Reviewed & discussed the following diabetes-related information with patient: o Continue checking blood sugars as directed o Follow ADA recommended "diabetes-friendly" diet  (reviewed healthy snack/food options) o Confirmed current DM regimen: Toujeo 42 units nightly. Humalog sliding scale with meals (0-15units)  Could consider GLP-1 to optimize regimen and compliance.  Will continue to address. o Discussed insulin injection technique; Patient uses AccuCheck glucometer-->needs test strip refills (patient was able to pick up refill).  o Reviewed medication purpose/side effects-->patient denies adverse events, denies hypoglycemia, reports states that her  blood sugars are "coming down", but does not elaborate on numbers. Reminded patient of goal and encouraged her to continue taking medications as prescribed, o Most recent A1c is 10.5 on 04/07/19 (continues to be elevated 10-11% this year) . Reviewed dispense history-->patient appears compliant with medication fills.  She continues to take Toujeo 42 units nightly.  She had been running out of pens early due to new RX needed for increased dose.  New RX for Toujeo has been sent to pharmacy.  Patient has picked up and reports compliance with insulin. . Statin in diabetes --> patient has been taking her statin as prescribed.  New RX called in for statin 90 day supply on 06/08/19.  Patient states she does miss doses "every now and then", but she actually feels worse when she misses statin.  Encouraged patient to continue taking statin as there are great benefits for its use in diabetes patients . Will discuss patient case with CCM RN CM, Angel Little, to assist in keeping patient engaged with diabetes management.  Patient Self Care Activities:  . Self administers medications as prescribed . Attends all scheduled provider appointments . Calls pharmacy for medication refills . Performs ADL's independently . Performs IADL's independently . Calls provider office for new concerns or questions  Please see past updates related to this goal by clicking on the "Past Updates" button in the selected goal      . Quit Smoking       Fall Risk Fall Risk  07/02/2019 07/02/2019 04/07/2019 12/08/2018 10/06/2018  Falls in the past year? 0 0 0 0 0  Risk for fall due to : - Medication side effect - - -  Follow up - Falls evaluation completed;Education provided;Falls prevention discussed - - -   Is the patient's home free of loose throw rugs in walkways, pet beds, electrical cords, etc?   yes      Grab bars in the bathroom? no      Handrails on the stairs?  n/a      Adequate lighting?   yes  Timed Get Up and Go performed:  n/a  Depression Screen PHQ 2/9 Scores 07/02/2019 04/07/2019 12/08/2018 10/06/2018  PHQ - 2 Score 0 0 0 0  PHQ- 9 Score 1 - - -     Cognitive Function  6CIT Screen 07/02/2019  What Year? 0 points  What month? 0 points  What time? 0 points  Count back from 20 0 points  Months in reverse 0 points  Repeat phrase 0 points  Total Score 0    Immunization History  Administered Date(s) Administered  . DTaP 06/10/2012  . Influenza,inj,Quad PF,6+ Mos 10/06/2013, 07/02/2019  . Influenza-Unspecified 08/06/2018  . Pneumococcal Polysaccharide-23 07/13/2013    Qualifies for Shingles Vaccine? yes  Screening Tests Health Maintenance  Topic Date Due  . OPHTHALMOLOGY EXAM  11/21/1965  . HIV Screening  12/09/2019 (Originally 11/21/1970)  . HEMOGLOBIN A1C  10/07/2019  . MAMMOGRAM  12/28/2019  . FOOT EXAM  04/06/2020  . URINE MICROALBUMIN  04/06/2020  . PAP SMEAR-Modifier  04/06/2022  . TETANUS/TDAP  06/10/2022  . COLONOSCOPY  12/08/2028  . INFLUENZA VACCINE  Completed  . PNEUMOCOCCAL POLYSACCHARIDE VACCINE AGE 65-64 HIGH RISK  Completed  . Hepatitis C Screening  Completed    Cancer Screenings: Lung: Low Dose CT Chest recommended if Age 83-80 years, 30 pack-year currently smoking OR have quit w/in 15years. Patient does not qualify. Breast:  Up to date on Mammogram? Yes   Up to date of Bone Density/Dexa? Yes Colorectal: up to date  Additional Screenings: : Hepatitis C Screening: 07/2013     Plan:    Patient wants to quit smoking.   I have personally reviewed and noted the following in the patient's chart:   . Medical and social history . Use of alcohol, tobacco or illicit drugs  . Current medications and supplements . Functional ability and status . Nutritional status . Physical activity . Advanced directives . List of other physicians . Hospitalizations, surgeries, and ER visits in previous 12 months . Vitals . Screenings to include cognitive, depression, and falls .  Referrals and appointments  In addition, I have reviewed and discussed with patient certain preventive protocols, quality metrics, and best practice recommendations. A written personalized care plan for preventive services as well as general preventive health recommendations were provided to patient.     Kellie Simmering, LPN  04/06/6167

## 2019-07-02 NOTE — Patient Instructions (Signed)

## 2019-07-03 LAB — BMP8+EGFR
BUN/Creatinine Ratio: 16 (ref 12–28)
BUN: 11 mg/dL (ref 8–27)
CO2: 25 mmol/L (ref 20–29)
Calcium: 9.1 mg/dL (ref 8.7–10.3)
Chloride: 100 mmol/L (ref 96–106)
Creatinine, Ser: 0.7 mg/dL (ref 0.57–1.00)
GFR calc Af Amer: 107 mL/min/{1.73_m2} (ref >59)
GFR calc non Af Amer: 93 mL/min/{1.73_m2} (ref >59)
Glucose: 259 mg/dL — ABNORMAL HIGH (ref 65–99)
Potassium: 4.7 mmol/L (ref 3.5–5.2)
Sodium: 138 mmol/L (ref 134–144)

## 2019-07-03 LAB — HEMOGLOBIN A1C
Est. average glucose Bld gHb Est-mCnc: 223 mg/dL
Hgb A1c MFr Bld: 9.4 % — ABNORMAL HIGH (ref 4.8–5.6)

## 2019-07-09 ENCOUNTER — Encounter: Payer: Self-pay | Admitting: Internal Medicine

## 2019-07-13 ENCOUNTER — Telehealth: Payer: Self-pay

## 2019-07-30 ENCOUNTER — Ambulatory Visit: Payer: Self-pay | Admitting: Pharmacist

## 2019-08-01 NOTE — Progress Notes (Signed)
  Chronic Care Management   Outreach Note  07/30/2019 Name: Kathryn Walsh MRN: [id number] DOB: 07/22/1956  Referred by: Glendale Chard, MD Reason for referral : Chronic Care Management   An unsuccessful telephone outreach was attempted today. The patient was referred to the case management team by for assistance with care management and care coordination.   Follow Up Plan: The care management team will reach out to the patient again over the next 5-7 business days.   Regina Eck, PharmD, BCPS Clinical Pharmacist, [facility] [place]: [phone]

## 2019-08-06 ENCOUNTER — Telehealth: Payer: Self-pay

## 2019-08-07 ENCOUNTER — Ambulatory Visit: Payer: Self-pay | Admitting: Pharmacist

## 2019-08-07 DIAGNOSIS — E1165 Type 2 diabetes mellitus with hyperglycemia: Secondary | ICD-10-CM

## 2019-08-07 DIAGNOSIS — I10 Essential (primary) hypertension: Secondary | ICD-10-CM

## 2019-08-07 NOTE — Progress Notes (Signed)
  Chronic Care Management   Outreach Note  08/07/2019 Name: Kathryn Walsh MRN: [id number] DOB: January 19, 1956  Referred by: Glendale Chard, MD Reason for referral : Chronic Care Management   A second unsuccessful telephone outreach was attempted today. The patient was referred to the case management team for assistance with care management and care coordination.   Follow Up Plan: The care management team will reach out to the patient again over the next 2 weeks.   Regina Eck, PharmD, BCPS Clinical Pharmacist, [facility] [place]: [phone]

## 2019-08-13 ENCOUNTER — Ambulatory Visit: Payer: Self-pay

## 2019-08-13 ENCOUNTER — Telehealth: Payer: Self-pay

## 2019-08-13 DIAGNOSIS — I10 Essential (primary) hypertension: Secondary | ICD-10-CM

## 2019-08-13 DIAGNOSIS — E1165 Type 2 diabetes mellitus with hyperglycemia: Secondary | ICD-10-CM

## 2019-08-13 NOTE — Chronic Care Management (AMB) (Signed)
  Chronic Care Management   Outreach Note  08/13/2019 Name: Kathryn Walsh MRN: [id number] DOB: 08-12-1956  Referred by: Glendale Chard, MD Reason for referral : Chronic Care Management (INITIAL CCM RNCM Telephone Outreach)   An unsuccessful telephone outreach was attempted today. The patient was referred to the case management team by Glendale Chard MD for assistance with care management and care coordination.   Follow Up Plan: Telephone follow up appointment with care management team member scheduled for: 09/10/19  Barb Merino, RN, BSN, CCM Care Management Coordinator [place] [facility]  Direct Phone: [phone]

## 2019-08-18 ENCOUNTER — Telehealth: Payer: Self-pay | Admitting: Pharmacist

## 2019-09-03 ENCOUNTER — Telehealth: Payer: Self-pay

## 2019-09-10 ENCOUNTER — Telehealth: Payer: Self-pay

## 2019-09-16 ENCOUNTER — Telehealth: Payer: Self-pay

## 2019-09-17 ENCOUNTER — Ambulatory Visit (INDEPENDENT_AMBULATORY_CARE_PROVIDER_SITE_OTHER): Payer: Medicare Other | Admitting: Pharmacist

## 2019-09-17 ENCOUNTER — Ambulatory Visit: Payer: Medicare Other | Admitting: Internal Medicine

## 2019-09-17 DIAGNOSIS — E1165 Type 2 diabetes mellitus with hyperglycemia: Secondary | ICD-10-CM

## 2019-09-17 DIAGNOSIS — I10 Essential (primary) hypertension: Secondary | ICD-10-CM

## 2019-09-18 ENCOUNTER — Ambulatory Visit: Payer: Self-pay

## 2019-09-18 DIAGNOSIS — E1165 Type 2 diabetes mellitus with hyperglycemia: Secondary | ICD-10-CM

## 2019-09-18 DIAGNOSIS — I10 Essential (primary) hypertension: Secondary | ICD-10-CM

## 2019-09-18 NOTE — Patient Instructions (Signed)
Social Worker Visit Information  Goals we discussed today:  Goals Addressed            This Visit's Progress     Patient Stated   . "My furnace broken and I can't afford to fix it" (pt-stated)       Current Barriers:  . Financial constraints related to cost of furnace repair . Lacks knowledge of community resource: [place] Work Clinical Goal(s):  [name] Over the next 60 days the patient will work with SW to identity possible resources to assist with heating repair  CCM SW Interventions: . Outbound call to the patient in response to communication received from PharmD indicating patient is utilizing space heaters in the home . Determined the patient is unable to repair her furnace at this time due to financial constraints . Educated the patient on local resources to assist with heating repair . Placed patient referral to [employer] . Scheduled follow up call over the next three weeks to assess outcome of referral . Advised the patient SW would place referral to alternative resource if needed  Patient Self Care Activities:  . Patient verbalizes understanding of plan to follow up with community resource regarding heat . Attends all scheduled provider appointments . Performs ADL's independently . Calls provider office for new concerns or questions  Initial goal documentation         Materials Provided: Verbal education about community resources provided by phone  Follow Up Plan: SW will follow up with patient by phone over the next 3 weeks.   Daneen Schick, BSW, CDP Social Worker, Certified Dementia Practitioner [place] [facility] [place] [facility] [phone]

## 2019-09-18 NOTE — Chronic Care Management (AMB) (Signed)
Chronic Care Management   Social Work General Note  09/18/2019 Name: Kathryn Walsh MRN: [id number] DOB: 30-Aug-1956  Kathryn Walsh is a 63 y.o. year old female who is a primary care patient of Glendale Chard, MD. The CCM was consulted to assist the patient with care coordination.   Review of patient status, including review of consultants reports, relevant laboratory and other test results, and collaboration with appropriate care team members and the patient's provider was performed as part of comprehensive patient evaluation and provision of chronic care management services.    SDOH (Social Determinants of Health) screening performed today. See Care Plan Entry related to challenges with: Housing   Outpatient Encounter Medications as of 09/18/2019  Medication Sig  . amLODipine (NORVASC) 10 MG tablet Take 1 tablet (10 mg total) by mouth daily. (Patient taking differently: Take 5 mg by mouth daily. )  . amLODipine (NORVASC) 5 MG tablet TAKE 1 TABLET BY MOUTH EVERY DAY  . aspirin 81 MG chewable tablet Chew 81 mg by mouth daily.  [name] azaTHIOprine (IMURAN) 50 MG tablet Take 100 mg by mouth 2 (two) times daily.   . Blood Glucose Monitoring Suppl (ACCU-CHEK AVIVA PLUS) w/Device KIT Use as directed to check blood sugars 3 times per day dx:e11.65  . Cholecalciferol (VITAMIN D HIGH POTENCY) 25 MCG (1000 UT) capsule Take 4,000 Units by mouth daily.  . cyclobenzaprine (FLEXERIL) 10 MG tablet One tab po qhs pr  . Insulin Glargine, 2 Unit Dial, (TOUJEO MAX SOLOSTAR) 300 UNIT/ML SOPN Inject 42 Units into the skin at bedtime. (Patient taking differently: Inject 32 Units into the skin at bedtime. )  . Insulin Lispro (HUMALOG KWIKPEN) 200 UNIT/ML SOPN Inject 6 Units into the skin 2 (two) times daily before a meal. Please inject 6 units before breakfast and dinner; max dose titration 40 units daily  . Multiple Vitamin (MULTIVITAMIN WITH MINERALS) TABS tablet Take 1 tablet by mouth daily.  . pravastatin  (PRAVACHOL) 40 MG tablet TAKE 1 TABLET BY MOUTH EVERYDAY AT BEDTIME  . propranolol (INDERAL) 20 MG tablet Take 20 mg by mouth 2 (two) times daily.  . tacrolimus (PROGRAF) 1 MG capsule Take 2 mg in am and 3 mg in pm  . Turmeric 500 MG TABS Take by mouth.   No facility-administered encounter medications on file as of 09/18/2019.     Goals Addressed            This Visit's Progress     Patient Stated   . "My furnace broken and I can't afford to fix it" (pt-stated)       Current Barriers:  . Financial constraints related to cost of furnace repair . Lacks knowledge of community resource: [place] Work Clinical Goal(s):  [name] Over the next 60 days the patient will work with SW to identity possible resources to assist with heating repair  CCM SW Interventions: . Outbound call to the patient in response to communication received from PharmD indicating patient is utilizing space heaters in the home . Determined the patient is unable to repair her furnace at this time due to financial constraints . Educated the patient on local resources to assist with heating repair . Placed patient referral to [employer] . Scheduled follow up call over the next three weeks to assess outcome of referral . Advised the patient SW would place referral to alternative resource if needed  Patient Self Care Activities:  . Patient verbalizes understanding of plan to follow up with  community resource regarding heat . Attends all scheduled provider appointments . Performs ADL's independently . Calls provider office for new concerns or questions  Initial goal documentation         Follow Up Plan: SW will follow up with patient by phone over the next three weeks       Daneen Schick, BSW, CDP Social Worker, Certified Dementia Practitioner [place] [facility] [place] [facility] [phone]  Total time spent performing care coordination and/or care management activities with  the patient by phone or face to face = 10 minutes.

## 2019-09-20 NOTE — Progress Notes (Signed)
Chronic Care Management    Visit Note  09/17/2019 Name: Kathryn Walsh MRN: [id number] DOB: 1956/09/01  Referred by: Glendale Chard, MD Reason for referral : Chronic Care Management   Kathryn Walsh is a 63 y.o. year old female who is a primary care patient of Glendale Chard, MD. The CCM team was consulted for assistance with chronic disease management and care coordination needs related to HLD and DMII  Review of patient status, including review of consultants reports, relevant laboratory and other test results, and collaboration with appropriate care team members and the patient's provider was performed as part of comprehensive patient evaluation and provision of chronic care management services.    I spoke with Kathryn Walsh by telephone today.  Medications: Outpatient Encounter Medications as of 09/17/2019  Medication Sig  . amLODipine (NORVASC) 10 MG tablet Take 1 tablet (10 mg total) by mouth daily. (Patient taking differently: Take 5 mg by mouth daily. )  . amLODipine (NORVASC) 5 MG tablet TAKE 1 TABLET BY MOUTH EVERY DAY  . aspirin 81 MG chewable tablet Chew 81 mg by mouth daily.  [name] azaTHIOprine (IMURAN) 50 MG tablet Take 100 mg by mouth 2 (two) times daily.   . Blood Glucose Monitoring Suppl (ACCU-CHEK AVIVA PLUS) w/Device KIT Use as directed to check blood sugars 3 times per day dx:e11.65  . Cholecalciferol (VITAMIN D HIGH POTENCY) 25 MCG (1000 UT) capsule Take 4,000 Units by mouth daily.  . cyclobenzaprine (FLEXERIL) 10 MG tablet One tab po qhs pr  . Insulin Glargine, 2 Unit Dial, (TOUJEO MAX SOLOSTAR) 300 UNIT/ML SOPN Inject 42 Units into the skin at bedtime. (Patient taking differently: Inject 32 Units into the skin at bedtime. )  . Insulin Lispro (HUMALOG KWIKPEN) 200 UNIT/ML SOPN Inject 6 Units into the skin 2 (two) times daily before a meal. Please inject 6 units before breakfast and dinner; max dose titration 40 units daily  . Multiple Vitamin (MULTIVITAMIN WITH  MINERALS) TABS tablet Take 1 tablet by mouth daily.  . pravastatin (PRAVACHOL) 40 MG tablet TAKE 1 TABLET BY MOUTH EVERYDAY AT BEDTIME  . propranolol (INDERAL) 20 MG tablet Take 20 mg by mouth 2 (two) times daily.  . tacrolimus (PROGRAF) 1 MG capsule Take 2 mg in am and 3 mg in pm  . Turmeric 500 MG TABS Take by mouth.   No facility-administered encounter medications on file as of 09/17/2019.      Objective:   Goals Addressed            This Visit's Progress     Patient Stated   . I would like to control my diabetes (pt-stated)       Current Barriers:  . Diabetes: T2DM; most recent A1c 9.5% on 07/02/19 (was 10.5% on 04/07/19)  . Current antihyperglycemic regimen: Toujeo 42 units qHS, Humalog sliding scale with meals (0-15 units)   o Reviewed dispense history-->patient appears compliant with medication fills.  She continues to take Toujeo 42 units nightly.  She previously was running out of pens early due to new RX needed for increased dose.  New RX for Toujeo has been sent to pharmacy.  Patient continues to pick up and reports compliance with insulin. . Denies hypoglycemic symptoms; Denies hyperglycemic symptoms . Current exercise: n/a; encouraged patient to exercise as able and as recommended by PCP . Current blood glucose readings:  o Reports that her blood sugars are "coming down".  She now states FBG<180 which is an improvement. Reminded patient of goal and encouraged  her to continue taking medications as prescribed, . Cardiovascular risk reduction: o Current hypertensive regimen: o Current hyperlipidemia regimen: Pravastatin 58m daily. Ensured new RX called in for pravastatin 90 day supply on 09/15/19 (filled).  Patient states she does miss doses "every now and then", but she actually feels worse when she misses statin.  Encouraged patient to continue taking statin as prescribed as there are positive benefits for its use in diabetes patients.  Pharmacist Clinical Goal(s):  .Marland KitchenOver  the next 90 days, patient will work with PharmD & PCP to address needs related to optimized medication management of chronic conditions  Interventions: . Comprehensive medication review performed, medication list updated in electronic medical record.  Reviewed medication fill history via insurance claims data confirming patient appears compliant with having her medications filled on time as prescribed by provider. . Reviewed & discussed the following diabetes-related information with patient: o Follow ADA recommended "diabetes-friendly" diet  (reviewed healthy snack/food options) o Discussed insulin injection technique; Patient uses AccuCheck glucometer  o Reviewed medication purpose/side effects-->patient denies adverse events  Patient Self Care Activities:  . Patient will check blood glucose daily , document, and provide at future appointments . Patient will focus on medication adherence by continuing to take medications as prescribed . Patient will take medications as prescribed . Patient will contact provider with any episodes of hypoglycemia . Patient will report any questions or concerns to provider   Please see past updates related to this goal by clicking on the "Past Updates" button in the selected goal           Plan:   The care management team will reach out to the patient again over the next 45 days.   Provider Signature JRegina Eck PharmD, BCPS Clinical Pharmacist, [facility] [place] [phone]

## 2019-09-20 NOTE — Patient Instructions (Signed)
Visit Information  Goals Addressed            This Visit's Progress     Patient Stated   . I would like to control my diabetes (pt-stated)       Current Barriers:  . Diabetes: T2DM; most recent A1c 9.5% on 07/02/19 (was 10.5% on 04/07/19)  . Current antihyperglycemic regimen: Toujeo 42 units qHS, Humalog sliding scale with meals (0-15 units)   o Reviewed dispense history-->patient appears compliant with medication fills.  She continues to take Toujeo 42 units nightly.  She previously was running out of pens early due to new RX needed for increased dose.  New RX for Toujeo has been sent to pharmacy.  Patient continues to pick up and reports compliance with insulin. . Denies hypoglycemic symptoms; Denies hyperglycemic symptoms . Current exercise: n/a; encouraged patient to exercise as able and as recommended by PCP . Current blood glucose readings:  o Reports that her blood sugars are "coming down".  She now states FBG<180 which is an improvement. Reminded patient of goal and encouraged her to continue taking medications as prescribed, . Cardiovascular risk reduction: o Current hypertensive regimen: o Current hyperlipidemia regimen: Pravastatin 40mg  daily. Ensured new RX called in for pravastatin 90 day supply on 09/15/19 (filled).  Patient states she does miss doses "every now and then", but she actually feels worse when she misses statin.  Encouraged patient to continue taking statin as prescribed as there are positive benefits for its use in diabetes patients.  Pharmacist Clinical Goal(s):  [name] Over the next 90 days, patient will work with PharmD & PCP to address needs related to optimized medication management of chronic conditions  Interventions: . Comprehensive medication review performed, medication list updated in electronic medical record.  Reviewed medication fill history via insurance claims data confirming patient appears compliant with having her medications filled on time as  prescribed by provider. . Reviewed & discussed the following diabetes-related information with patient: o Follow ADA recommended "diabetes-friendly" diet  (reviewed healthy snack/food options) o Discussed insulin injection technique; Patient uses AccuCheck glucometer  o Reviewed medication purpose/side effects-->patient denies adverse events  Patient Self Care Activities:  . Patient will check blood glucose daily , document, and provide at future appointments . Patient will focus on medication adherence by continuing to take medications as prescribed . Patient will take medications as prescribed . Patient will contact provider with any episodes of hypoglycemia . Patient will report any questions or concerns to provider   Please see past updates related to this goal by clicking on the "Past Updates" button in the selected goal         The patient verbalized understanding of instructions provided today and declined a print copy of patient instruction materials.   The care management team will reach out to the patient again over the next 45 days.   SIGNATURE Regina Eck, PharmD, BCPS Clinical Pharmacist, [facility] [place]: [phone]

## 2019-10-08 ENCOUNTER — Ambulatory Visit: Payer: Self-pay

## 2019-10-08 DIAGNOSIS — E1165 Type 2 diabetes mellitus with hyperglycemia: Secondary | ICD-10-CM

## 2019-10-09 NOTE — Chronic Care Management (AMB) (Signed)
Chronic Care Management   Social Work Follow Up Note  10/08/2019 Name: Kathryn Walsh MRN: [id number] DOB: 26-Nov-1955  Kathryn Walsh is a 63 y.o. year old female who is a primary care patient of Glendale Chard, MD. The CCM team was consulted for assistance with care coordination.   Review of patient status, including review of consultants reports, other relevant assessments, and collaboration with appropriate care team members and the patient's provider was performed as part of comprehensive patient evaluation and provision of chronic care management services.    SW placed an outbound call to the patient to review patient goal and assist with care coordination needs.   Outpatient Encounter Medications as of 10/08/2019  Medication Sig  . amLODipine (NORVASC) 10 MG tablet Take 1 tablet (10 mg total) by mouth daily. (Patient taking differently: Take 5 mg by mouth daily. )  . amLODipine (NORVASC) 5 MG tablet TAKE 1 TABLET BY MOUTH EVERY DAY  . aspirin 81 MG chewable tablet Chew 81 mg by mouth daily.  [name] azaTHIOprine (IMURAN) 50 MG tablet Take 100 mg by mouth 2 (two) times daily.   . Blood Glucose Monitoring Suppl (ACCU-CHEK AVIVA PLUS) w/Device KIT Use as directed to check blood sugars 3 times per day dx:e11.65  . Cholecalciferol (VITAMIN D HIGH POTENCY) 25 MCG (1000 UT) capsule Take 4,000 Units by mouth daily.  . cyclobenzaprine (FLEXERIL) 10 MG tablet One tab po qhs pr  . Insulin Glargine, 2 Unit Dial, (TOUJEO MAX SOLOSTAR) 300 UNIT/ML SOPN Inject 42 Units into the skin at bedtime. (Patient taking differently: Inject 32 Units into the skin at bedtime. )  . Insulin Lispro (HUMALOG KWIKPEN) 200 UNIT/ML SOPN Inject 6 Units into the skin 2 (two) times daily before a meal. Please inject 6 units before breakfast and dinner; max dose titration 40 units daily  . Multiple Vitamin (MULTIVITAMIN WITH MINERALS) TABS tablet Take 1 tablet by mouth daily.  . pravastatin (PRAVACHOL) 40 MG tablet TAKE 1  TABLET BY MOUTH EVERYDAY AT BEDTIME  . propranolol (INDERAL) 20 MG tablet Take 20 mg by mouth 2 (two) times daily.  . tacrolimus (PROGRAF) 1 MG capsule Take 2 mg in am and 3 mg in pm  . Turmeric 500 MG TABS Take by mouth.   No facility-administered encounter medications on file as of 10/08/2019.     Goals Addressed            This Visit's Progress     Patient Stated   . "My furnace broken and I can't afford to fix it" (pt-stated)       Current Barriers:  . Financial constraints related to cost of furnace repair . Lacks knowledge of community resource: [place] Work Clinical Goal(s):  [name] Over the next 60 days the patient will work with SW to identity possible resources to assist with heating repair  CCM SW Interventions: Completed 10/08/2019 . Outbound call to the patient to assess progression of patient goal . Determined the patient has yet to receive contact from [employer] . Outbound call placed to [employer], voice message left for [facility] requesting a return call  Patient Self Care Activities:  . Patient verbalizes understanding of plan to follow up with community resource regarding heat . Attends all scheduled provider appointments . Performs ADL's independently . Calls provider office for new concerns or questions  Please see past updates related to this goal by clicking on the "Past Updates" button in the selected goal  Follow Up Plan: SW will follow up with patient by phone over the next week   Daneen Schick, BSW, CDP Social Worker, Certified Dementia Practitioner [place] [facility] [place] [facility] [phone]

## 2019-10-14 ENCOUNTER — Telehealth: Payer: Self-pay

## 2019-11-02 ENCOUNTER — Other Ambulatory Visit: Payer: Self-pay

## 2019-11-20 ENCOUNTER — Telehealth: Payer: Self-pay

## 2019-11-23 ENCOUNTER — Other Ambulatory Visit: Payer: Self-pay | Admitting: Internal Medicine

## 2019-11-23 DIAGNOSIS — Z944 Liver transplant status: Secondary | ICD-10-CM | POA: Diagnosis not present

## 2019-11-23 DIAGNOSIS — Z298 Encounter for other specified prophylactic measures: Secondary | ICD-10-CM | POA: Diagnosis not present

## 2019-11-23 DIAGNOSIS — D849 Immunodeficiency, unspecified: Secondary | ICD-10-CM | POA: Diagnosis not present

## 2019-11-23 NOTE — Telephone Encounter (Signed)
It is ok for the cyclobenzaprine refill

## 2019-12-01 DIAGNOSIS — Z23 Encounter for immunization: Secondary | ICD-10-CM | POA: Diagnosis not present

## 2019-12-21 ENCOUNTER — Encounter: Payer: Self-pay | Admitting: Internal Medicine

## 2019-12-21 ENCOUNTER — Ambulatory Visit: Payer: Self-pay | Admitting: Pharmacist

## 2019-12-21 ENCOUNTER — Ambulatory Visit (INDEPENDENT_AMBULATORY_CARE_PROVIDER_SITE_OTHER): Payer: Medicare Other | Admitting: Internal Medicine

## 2019-12-21 ENCOUNTER — Other Ambulatory Visit: Payer: Self-pay

## 2019-12-21 VITALS — BP 126/70 | HR 86 | Temp 98.4°F | Ht 67.0 in | Wt 160.0 lb

## 2019-12-21 DIAGNOSIS — Z944 Liver transplant status: Secondary | ICD-10-CM

## 2019-12-21 DIAGNOSIS — E1165 Type 2 diabetes mellitus with hyperglycemia: Secondary | ICD-10-CM

## 2019-12-21 DIAGNOSIS — D849 Immunodeficiency, unspecified: Secondary | ICD-10-CM | POA: Diagnosis not present

## 2019-12-21 DIAGNOSIS — I10 Essential (primary) hypertension: Secondary | ICD-10-CM | POA: Diagnosis not present

## 2019-12-21 DIAGNOSIS — Z716 Tobacco abuse counseling: Secondary | ICD-10-CM

## 2019-12-22 ENCOUNTER — Encounter: Payer: Self-pay | Admitting: Internal Medicine

## 2019-12-22 ENCOUNTER — Telehealth: Payer: Self-pay

## 2019-12-22 LAB — CMP14+EGFR
ALT: 10 IU/L (ref 0–32)
AST: 13 IU/L (ref 0–40)
Albumin/Globulin Ratio: 1.2 (ref 1.2–2.2)
Albumin: 3.7 g/dL — ABNORMAL LOW (ref 3.8–4.8)
Alkaline Phosphatase: 184 IU/L — ABNORMAL HIGH (ref 39–117)
BUN/Creatinine Ratio: 17 (ref 12–28)
BUN: 13 mg/dL (ref 8–27)
Bilirubin Total: 0.3 mg/dL (ref 0.0–1.2)
CO2: 25 mmol/L (ref 20–29)
Calcium: 8.8 mg/dL (ref 8.7–10.3)
Chloride: 101 mmol/L (ref 96–106)
Creatinine, Ser: 0.76 mg/dL (ref 0.57–1.00)
GFR calc Af Amer: 96 mL/min/{1.73_m2} (ref >59)
GFR calc non Af Amer: 83 mL/min/{1.73_m2} (ref >59)
Globulin, Total: 3.1 g/dL (ref 1.5–4.5)
Glucose: 276 mg/dL — ABNORMAL HIGH (ref 65–99)
Potassium: 4.3 mmol/L (ref 3.5–5.2)
Sodium: 137 mmol/L (ref 134–144)
Total Protein: 6.8 g/dL (ref 6.0–8.5)

## 2019-12-22 LAB — HEMOGLOBIN A1C
Est. average glucose Bld gHb Est-mCnc: 298 mg/dL
Hgb A1c MFr Bld: 12 % — ABNORMAL HIGH (ref 4.8–5.6)

## 2019-12-22 NOTE — Telephone Encounter (Signed)
-----   Message from Glendale Chard, MD sent at 12/22/2019  9:53 AM EST ----- Your liver and kidney function are stable. Your hba1c is 12.0.  I am going to refer you to an endocrinologist for further management of your diabetes. You have not been at goal in over a year. Please make necessary dietary changes. Please call in your sugars - you have been prescribed insulin, but you reported yesterday you were not taking prescribed amount. Please call office on Th with AM BS readings. I want to hear from you every Monday and Thursday so I can adjust your Toujeo dose. Someone will contact you once an appt has been scheduled with endocrinologist.

## 2019-12-22 NOTE — Progress Notes (Signed)
This visit occurred during the SARS-CoV-2 public health emergency.  Safety protocols were in place, including screening questions prior to the visit, additional usage of staff PPE, and extensive cleaning of exam room while observing appropriate contact time as indicated for disinfecting solutions.  Subjective:     Patient ID: Kathryn Walsh , female    DOB: 04/22/1956 , 64 y.o.   MRN: [id number]   Chief Complaint  Patient presents with  . Diabetes  . Hypertension    HPI  She is here today for diabetes check.  Sh reports compliance with meds.   Diabetes She presents for her follow-up diabetic visit. She has type 2 diabetes mellitus. Her disease course has been worsening. There are no hypoglycemic associated symptoms. Pertinent negatives for diabetes include no blurred vision and no chest pain. There are no hypoglycemic complications. Risk factors for coronary artery disease include diabetes mellitus, dyslipidemia, hypertension and sedentary lifestyle. Her weight is decreasing steadily. Her breakfast blood glucose is taken between 9-10 am. Her breakfast blood glucose range is generally 180-200 mg/dl.  Hypertension This is a chronic problem. The current episode started more than 1 year ago. The problem has been gradually improving since onset. The problem is controlled. Pertinent negatives include no blurred vision, chest pain, palpitations or shortness of breath. The current treatment provides moderate improvement. Compliance problems include exercise.      Past Medical History:  Diagnosis Date  . Diabetes mellitus without complication ([place])   . High cholesterol   . HTN (hypertension)   . Liver transplant recipient [facility])      Family History  Problem Relation Age of Onset  . Healthy Mother   . Healthy Father      Current Outpatient Medications:  .  amLODipine (NORVASC) 5 MG tablet, TAKE 1 TABLET BY MOUTH EVERY DAY, Disp: 90 tablet, Rfl: 2 .  aspirin 81 MG chewable tablet, Chew  81 mg by mouth daily., Disp: , Rfl:  .  azaTHIOprine (IMURAN) 50 MG tablet, Take 100 mg by mouth 2 (two) times daily. , Disp: , Rfl:  .  Blood Glucose Monitoring Suppl (ACCU-CHEK AVIVA PLUS) w/Device KIT, Use as directed to check blood sugars 3 times per day dx:e11.65, Disp: 1 kit, Rfl: 1 .  Cholecalciferol (VITAMIN D HIGH POTENCY) 25 MCG (1000 UT) capsule, Take 4,000 Units by mouth daily., Disp: , Rfl:  .  cyclobenzaprine (FLEXERIL) 10 MG tablet, TAKE 1 TABLET BY MOUTH EVERYDAY AT BEDTIME AS NEEDED FOR BACK PAIN, Disp: 30 tablet, Rfl: 0 .  Insulin Glargine, 2 Unit Dial, (TOUJEO MAX SOLOSTAR) 300 UNIT/ML SOPN, Inject 42 Units into the skin at bedtime. (Patient taking differently: Inject 32 Units into the skin at bedtime. ), Disp: 5 pen, Rfl: 2 .  Insulin Lispro (HUMALOG KWIKPEN) 200 UNIT/ML SOPN, Inject 6 Units into the skin 2 (two) times daily before a meal. Please inject 6 units before breakfast and dinner; max dose titration 40 units daily, Disp: 5 pen, Rfl: 5 .  pravastatin (PRAVACHOL) 40 MG tablet, TAKE 1 TABLET BY MOUTH EVERYDAY AT BEDTIME, Disp: 90 tablet, Rfl: 1 .  propranolol (INDERAL) 20 MG tablet, Take 20 mg by mouth 2 (two) times daily., Disp: , Rfl:  .  tacrolimus (PROGRAF) 1 MG capsule, Take 2 mg in am and 3 mg in pm, Disp: , Rfl:  .  Turmeric 500 MG TABS, Take by mouth., Disp: , Rfl:    No Known Allergies   Review of Systems  Constitutional: Negative.   Eyes:  Negative for blurred vision.  Respiratory: Negative.  Negative for shortness of breath.   Cardiovascular: Negative.  Negative for chest pain and palpitations.  Gastrointestinal: Negative.   Neurological: Negative.   Psychiatric/Behavioral: Negative.      Today's Vitals   12/21/19 1552  BP: 126/70  Pulse: 86  Temp: 98.4 F (36.9 C)  TempSrc: Oral  Weight: 160 lb (72.6 kg)  Height: 5' 7" (1.702 m)  PainSc: 0-No pain   Body mass index is 25.06 kg/m.   Wt Readings from Last 3 Encounters:  12/21/19 160 lb (72.6  kg)  07/02/19 170 lb 6.4 oz (77.3 kg)  07/02/19 170 lb 6.4 oz (77.3 kg)     Objective:  Physical Exam Vitals and nursing note reviewed.  Constitutional:      Appearance: Normal appearance.  HENT:     Head: Normocephalic and atraumatic.  Cardiovascular:     Rate and Rhythm: Normal rate and regular rhythm.     Heart sounds: Normal heart sounds.  Pulmonary:     Effort: Pulmonary effort is normal.     Breath sounds: Normal breath sounds.  Skin:    General: Skin is warm.  Neurological:     General: No focal deficit present.     Mental Status: She is alert.  Psychiatric:        Mood and Affect: Mood normal.        Behavior: Behavior normal.         Assessment And Plan:     1. Uncontrolled type 2 diabetes mellitus with hyperglycemia (HCC)  Chronic, I will check labs as listed below.  Unfortunately, her last a1c was 9.4 in Sept 2020.   Importance of dietary compliance was discussed with the patient. I will check renal function today.   - CMP14+EGFR - Hemoglobin A1c  2. Essential hypertension  Chronic, well controlled. She will continue with current meds. She is encouraged to avoid   3. Liver transplant status ([place])   4. Immunosuppressed status ([place])  She is encouraged to practice 3 W's, due to her immune status. She is encouraged to speak to her transplant team regarding COVID vaccine.   5. Encounter for smoking cessation counseling  Smoking cessation instruction/counseling given:  counseled patient on the dangers of tobacco use, advised patient to stop smoking, and reviewed strategies to maximize success  She is encouraged to quit smoking, advised of increased inflammation that results from smoking.   Maximino Greenland, MD    THE PATIENT IS ENCOURAGED TO PRACTICE SOCIAL DISTANCING DUE TO THE COVID-19 PANDEMIC.

## 2019-12-22 NOTE — Telephone Encounter (Signed)
LVM for pt to call the office to receive message from provider   Kathryn Chard, MD  Michelle Nasuti, CMA  Cc: Kathryn Walsh, Kathryn Walsh  Your liver and kidney function are stable. Your hba1c is 12.0. I am going to refer you to an endocrinologist for further management of your diabetes. You have not been at goal in over a year. Please make necessary dietary changes. Please call in your sugars - you have been prescribed insulin, but you reported yesterday you were not taking prescribed amount. Please call office on Th with AM BS readings. I want to hear from you every Monday and Thursday so I can adjust your Toujeo dose. Someone will contact you once an appt has been scheduled with endocrinologist.

## 2019-12-22 NOTE — Patient Instructions (Signed)

## 2019-12-23 ENCOUNTER — Telehealth: Payer: Self-pay

## 2019-12-23 ENCOUNTER — Ambulatory Visit: Payer: Self-pay

## 2019-12-23 DIAGNOSIS — E1165 Type 2 diabetes mellitus with hyperglycemia: Secondary | ICD-10-CM

## 2019-12-23 NOTE — Chronic Care Management (AMB) (Signed)
  Chronic Care Management    Social Work Follow Up Note  12/23/2019 Name: Kathryn Walsh MRN: [id number] DOB: [birth year]  Kathryn Walsh is a 64 y.o. year old female who is a primary care patient of Glendale Chard, MD. The CCM team was consulted for assistance with care coordination.   Review of patient status, including review of consultants reports, other relevant assessments, and collaboration with appropriate care team members and the patient's provider was performed as part of comprehensive patient evaluation and provision of chronic care management services.    SDOH (Social Determinants of Health) assessments performed: Yes. No acute needs identified. The patient reports concern with cost of insulin but reports she is actively engaged with PharmD regarding financial assistance programs.   Outpatient Encounter Medications as of 12/23/2019  Medication Sig  . amLODipine (NORVASC) 5 MG tablet TAKE 1 TABLET BY MOUTH EVERY DAY  . aspirin 81 MG chewable tablet Chew 81 mg by mouth daily.  [name] azaTHIOprine (IMURAN) 50 MG tablet Take 100 mg by mouth 2 (two) times daily.   . Blood Glucose Monitoring Suppl (ACCU-CHEK AVIVA PLUS) w/Device KIT Use as directed to check blood sugars 3 times per day dx:e11.65  . Cholecalciferol (VITAMIN D HIGH POTENCY) 25 MCG (1000 UT) capsule Take 4,000 Units by mouth daily.  . cyclobenzaprine (FLEXERIL) 10 MG tablet TAKE 1 TABLET BY MOUTH EVERYDAY AT BEDTIME AS NEEDED FOR BACK PAIN  . Insulin Glargine, 2 Unit Dial, (TOUJEO MAX SOLOSTAR) 300 UNIT/ML SOPN Inject 42 Units into the skin at bedtime. (Patient taking differently: Inject 32 Units into the skin at bedtime. )  . Insulin Lispro (HUMALOG KWIKPEN) 200 UNIT/ML SOPN Inject 6 Units into the skin 2 (two) times daily before a meal. Please inject 6 units before breakfast and dinner; max dose titration 40 units daily  . pravastatin (PRAVACHOL) 40 MG tablet TAKE 1 TABLET BY MOUTH EVERYDAY AT BEDTIME  . propranolol  (INDERAL) 20 MG tablet Take 20 mg by mouth 2 (two) times daily.  . tacrolimus (PROGRAF) 1 MG capsule Take 2 mg in am and 3 mg in pm  . Turmeric 500 MG TABS Take by mouth.   No facility-administered encounter medications on file as of 12/23/2019.     Follow Up Plan: SW will follow up with patient by phone over the next month.  Daneen Schick, BSW, CDP Social Worker, Certified Dementia Practitioner [facility] [phone]

## 2019-12-23 NOTE — Telephone Encounter (Signed)
3rd attempt to reach pt to give lab results to and to give provider message to  Kathryn Chard, MD  Michelle Nasuti, CMA  Cc: [name], [place]  Your liver and kidney function are stable. Your hba1c is 12.0. I am going to refer you to an endocrinologist for further management of your diabetes. You have not been at goal in over a year. Please make necessary dietary changes. Please call in your sugars - you have been prescribed insulin, but you reported yesterday you were not taking prescribed amount. Please call office on Th with AM BS readings. I want to hear from you every Monday and Thursday so I can adjust your Toujeo dose. Someone will contact you once an appt has been scheduled with endocrinologist.

## 2019-12-25 ENCOUNTER — Other Ambulatory Visit: Payer: Self-pay

## 2019-12-25 ENCOUNTER — Telehealth: Payer: Self-pay

## 2019-12-25 ENCOUNTER — Ambulatory Visit: Payer: Self-pay

## 2019-12-25 DIAGNOSIS — E1165 Type 2 diabetes mellitus with hyperglycemia: Secondary | ICD-10-CM | POA: Diagnosis not present

## 2019-12-25 DIAGNOSIS — I10 Essential (primary) hypertension: Secondary | ICD-10-CM | POA: Diagnosis not present

## 2019-12-28 NOTE — Patient Instructions (Signed)
Visit Information  Goals Addressed      Patient Stated   . "I would like to lower my A1C" (pt-stated)       CARE PLAN ENTRY (see longtitudinal plan of care for additional care plan information)  Current Barriers:  [name] Knowledge Deficits related to disease process and Self Health management of DM . Chronic Disease Management support and education needs related to DMII, HTN  Nurse Case Manager Clinical Goal(s):  [name] Over the next 90 days, patient will work with the CCM team and PCP to address needs related to disease education and support to improve Self Health management of DM AEB patient will lower her A1C <12.0 . Over the next 60 days, patient will complete a new patient appointment with Endocrinology for management of DM  CCM RN CM Interventions:  12/28/19 spoke to patient  . Evaluation of current treatment plan related to Diabetes and patient's adherence to plan as established by provider. . Provided education to patient re: recent A1C is up to 12.0; education provided to patient with rationale regarding target A1C of <7.0; Education provided related to ways to achieve this goal through medication adherence, following a diabetic friendly diet and implementing exercise into daily routine  . Reviewed medications with patient and discussed the indication, dosage and frequency of patient's prescribed medication regimen; patient reports adherence with no missed doses; patient did experience symptoms of hypoglycemia this morning after administering 6 units of Insulin Lispro;  she was unable to check her BS but became symptomatic, patient drank orange juice to bring up her sugar; Discussed patient had eaten a boiled egg, 1 slice of toast and water for breakfast; Determined her FBS was 194 prior to insulin administration; Educated patient on appropriate Self health management of hypo/hyperglycemic events . Collaborated with Dr. Baird Cancer PCP  regarding patient's reported dose of Toujeo and hypoglycemic  event occurring this morning following her insulin adminstration  . Discussed plans with patient for ongoing care management follow up and provided patient with direct contact information for care management team . Provided patient with printed educational materials related to Diabetes management with Meal Planning; Diabetes Zone Safety Tool; Know Your A1C, Carb Choices, Carb Counting . Advised patient, providing education and rationale, to check cbg daily before breakfast and record, calling the CCM team and or PCP for findings outside established parameters.    Patient Self Care Activities:  . Patient verbalizes understanding of plan to notify the CCM team and or PCP for reoccurring hypoglycemic events . Self administers medications as prescribed . Attends all scheduled provider appointments . Calls pharmacy for medication refills . Performs ADL's independently . Performs IADL's independently . Calls provider office for new concerns or questions  Initial goal documentation       Patient verbalizes understanding of instructions provided today.   Telephone follow up appointment with care management team member scheduled for: 01/21/20  Barb Merino, RN, BSN, CCM Care Management Coordinator [place] [facility]  Direct Phone: [phone]

## 2019-12-28 NOTE — Chronic Care Management (AMB) (Signed)
Chronic Care Management   Initial Visit Note  12/28/2019 Name: Kathryn Walsh MRN: [id number] DOB: [birth year]  Referred by: Kathryn Chard, MD Reason for referral : Chronic Care Management (RQ #2 Initial Call - DM, HTN)   Kathryn Walsh is a 64 y.o. year old female who is a primary care patient of Kathryn Chard, MD. The CCM team was consulted for assistance with chronic disease management and care coordination needs related to HTN and DMII  Review of patient status, including review of consultants reports, relevant laboratory and other test results, and collaboration with appropriate care team members and the patient's provider was performed as part of comprehensive patient evaluation and provision of chronic care management services.    SDOH (Social Determinants of Health) assessments performed: No See Care Plan activities for detailed interventions related to Kathryn Walsh)   Placed initial CCM RN CM outbound call to patient to assess for CCM RN CM needs.     Medications: Outpatient Encounter Medications as of 12/25/2019  Medication Sig Note  . Insulin Glargine, 2 Unit Dial, (TOUJEO MAX SOLOSTAR) 300 UNIT/ML SOPN Inject 42 Units into the skin at bedtime. (Patient taking differently: Inject 32 Units into the skin at bedtime. ) 12/28/2019: 12/28/19 Patient reports she is taking 40 units at bedtime  . Insulin Lispro (HUMALOG KWIKPEN) 200 UNIT/ML SOPN Inject 6 Units into the skin 2 (two) times daily before a meal. Please inject 6 units before breakfast and dinner; max dose titration 40 units daily   . amLODipine (NORVASC) 5 MG tablet TAKE 1 TABLET BY MOUTH EVERY DAY   . aspirin 81 MG chewable tablet Chew 81 mg by mouth daily.   Kathryn Walsh azaTHIOprine (IMURAN) 50 MG tablet Take 100 mg by mouth 2 (two) times daily.    . Blood Glucose Monitoring Suppl (ACCU-CHEK AVIVA PLUS) w/Device KIT Use as directed to check blood sugars 3 times per day dx:e11.65   . Cholecalciferol (VITAMIN D HIGH POTENCY) 25 MCG (1000  UT) capsule Take 4,000 Units by mouth daily.   . cyclobenzaprine (FLEXERIL) 10 MG tablet TAKE 1 TABLET BY MOUTH EVERYDAY AT BEDTIME AS NEEDED FOR BACK PAIN   . pravastatin (PRAVACHOL) 40 MG tablet TAKE 1 TABLET BY MOUTH EVERYDAY AT BEDTIME   . propranolol (INDERAL) 20 MG tablet Take 20 mg by mouth 2 (two) times daily.   . tacrolimus (PROGRAF) 1 MG capsule Take 2 mg in am and 3 mg in pm   . Turmeric 500 MG TABS Take by mouth.    No facility-administered encounter medications on file as of 12/25/2019.     Objective:  Lab Results  Component Value Date   HGBA1C 12.0 (H) 12/21/2019   HGBA1C 9.4 (H) 07/02/2019   HGBA1C 10.5 (H) 04/07/2019   Lab Results  Component Value Date   MICROALBUR 150 04/07/2019   LDLCALC 96 02/09/2019   CREATININE 0.76 12/21/2019   BP Readings from Last 3 Encounters:  12/21/19 126/70  07/02/19 138/70  07/02/19 138/70    Goals Addressed      Patient Stated   . "I would like to lower my A1C" (pt-stated)       CARE PLAN ENTRY (see longtitudinal plan of care for additional care plan information)  Current Barriers:  Kathryn Walsh Knowledge Deficits related to disease process and Self Health management of DM . Chronic Disease Management support and education needs related to DMII, HTN  Nurse Case Manager Clinical Goal(s):  Kathryn Walsh Over the next 90 days, patient will work with the CCM team  and PCP to address needs related to disease education and support to improve Self Health management of DM AEB patient will lower her A1C <12.0 . Over the next 60 days, patient will complete a new patient appointment with Endocrinology for management of DM  CCM RN CM Interventions:  12/28/19 spoke to patient  . Evaluation of current treatment plan related to Diabetes and patient's adherence to plan as established by provider. . Provided education to patient re: recent A1C is up to 12.0; education provided to patient with rationale regarding target A1C of <7.0; Education provided related to  ways to achieve this goal through medication adherence, following a diabetic friendly diet and implementing exercise into daily routine  . Reviewed medications with patient and discussed the indication, dosage and frequency of patient's prescribed medication regimen; patient reports adherence with no missed doses; patient did experience symptoms of hypoglycemia this morning after administering 6 units of Insulin Lispro;  she was unable to check her BS but became symptomatic, patient drank orange juice to bring up her sugar; Discussed patient had eaten a boiled egg, 1 slice of toast and water for breakfast; Determined her FBS was 194 prior to insulin administration; Educated patient on appropriate Self health management of hypo/hyperglycemic events . Collaborated with Kathryn Walsh Cancer PCP  regarding patient's reported dose of Toujeo and hypoglycemic event occurring this morning following her insulin adminstration  . Discussed plans with patient for ongoing care management follow up and provided patient with direct contact information for care management team . Provided patient with printed educational materials related to Diabetes management with Meal Planning; Diabetes Zone Safety Tool; Know Your A1C, Carb Choices, Carb Counting . Advised patient, providing education and rationale, to check cbg daily before breakfast and record, calling the CCM team and or PCP for findings outside established parameters.    Patient Self Care Activities:  . Patient verbalizes understanding of plan to notify the CCM team and or PCP for reoccurring hypoglycemic events . Self administers medications as prescribed . Attends all scheduled provider appointments . Calls pharmacy for medication refills . Performs ADL's independently . Performs IADL's independently . Calls provider office for new concerns or questions  Initial goal documentation      Plan:   Telephone follow up appointment with care management team member  scheduled for: 01/21/20  Barb Merino, RN, BSN, CCM Care Management Coordinator [place] [facility]  Direct Phone: [phone]

## 2019-12-31 NOTE — Progress Notes (Signed)
Chronic Care Management   Visit Note  12/21/2019 Name: Kathryn Walsh MRN: [id number] DOB: 09/16/1956  Referred by: Glendale Chard, MD Reason for referral : CCM-DM   Kathryn Walsh is a 64 y.o. year old female who is a primary care patient of Glendale Chard, MD. The CCM team was consulted for assistance with chronic disease management and care coordination needs related to DMII  Review of patient status, including review of consultants reports, relevant laboratory and other test results, and collaboration with appropriate care team members and the patient's provider was performed as part of comprehensive patient evaluation and provision of chronic care management services.    SDOH (Social Determinants of Health) assessments performed: Yes See Care Plan activities for detailed interventions related to SDOH)     Medications: Outpatient Encounter Medications as of 12/21/2019  Medication Sig Note  . amLODipine (NORVASC) 5 MG tablet TAKE 1 TABLET BY MOUTH EVERY DAY   . aspirin 81 MG chewable tablet Chew 81 mg by mouth daily.   [name] azaTHIOprine (IMURAN) 50 MG tablet Take 100 mg by mouth 2 (two) times daily.    . Blood Glucose Monitoring Suppl (ACCU-CHEK AVIVA PLUS) w/Device KIT Use as directed to check blood sugars 3 times per day dx:e11.65   . Cholecalciferol (VITAMIN D HIGH POTENCY) 25 MCG (1000 UT) capsule Take 4,000 Units by mouth daily.   . cyclobenzaprine (FLEXERIL) 10 MG tablet TAKE 1 TABLET BY MOUTH EVERYDAY AT BEDTIME AS NEEDED FOR BACK PAIN   . Insulin Glargine, 2 Unit Dial, (TOUJEO MAX SOLOSTAR) 300 UNIT/ML SOPN Inject 42 Units into the skin at bedtime. (Patient taking differently: Inject 32 Units into the skin at bedtime. ) 12/28/2019: 12/28/19 Patient reports she is taking 40 units at bedtime  . Insulin Lispro (HUMALOG KWIKPEN) 200 UNIT/ML SOPN Inject 6 Units into the skin 2 (two) times daily before a meal. Please inject 6 units before breakfast and dinner; max dose titration 40  units daily   . pravastatin (PRAVACHOL) 40 MG tablet TAKE 1 TABLET BY MOUTH EVERYDAY AT BEDTIME   . propranolol (INDERAL) 20 MG tablet Take 20 mg by mouth 2 (two) times daily.   . tacrolimus (PROGRAF) 1 MG capsule Take 2 mg in am and 3 mg in pm   . Turmeric 500 MG TABS Take by mouth.    No facility-administered encounter medications on file as of 12/21/2019.     Objective:   Goals Addressed            This Visit's Progress     Patient Stated   . I would like to control my diabetes (pt-stated)       Current Barriers:  . Diabetes: T2DM; most recent A1c increased to 12.5%  . Current antihyperglycemic regimen: Toujeo 42 units qHS, Humalog sliding scale with meals (0-15 units)   o Denies hypoglycemic symptoms; Denies hyperglycemic symptoms o Unable to gauge patient's compliance; A1c does not match with patient-reported compliance. o MD referral for endocrine o Samples provided for patient (basaglar and humalog)--Toujeo was not available.  Patient has lost medicaid and is unable to afford medications. . Current exercise: n/a; encouraged patient to exercise as able and as recommended by PCP.  Patient is now working 3-4 hours per day. . Current blood glucose readings:  o Reports that her blood sugars are "coming down".  Strongly recommended patient to take medications as prescribed and abide by diet/lifestyle . Cardiovascular risk reduction: o Current hypertensive regimen: o Current hyperlipidemia regimen: Pravastatin 54m daily. Patient  states she does miss doses "every now and then", but she actually feels worse when she misses statin.  Encouraged patient to continue taking statin as prescribed as there are positive benefits for its use in diabetes patients.  Pharmacist Clinical Goal(s):  [name] Over the next 90 days, patient will work with PharmD & PCP to address needs related to optimized medication management of chronic conditions  Interventions: . Comprehensive medication review  performed, medication list updated in electronic medical record.   . Reviewed & discussed the following diabetes-related information with patient: o Follow ADA recommended "diabetes-friendly" diet  (reviewed healthy snack/food options) o Discussed insulin injection technique; Patient uses AccuCheck glucometer  o Reviewed medication purpose/side effects-->patient denies adverse events  Patient Self Care Activities:  . Patient will check blood glucose daily , document, and provide at future appointments . Patient will focus on medication adherence by continuing to take medications as prescribed . Patient will take medications as prescribed . Patient will contact provider with any episodes of hypoglycemia . Patient will report any questions or concerns to provider   Please see past updates related to this goal by clicking on the "Past Updates" button in the selected goal         Plan:   The care management team will reach out to the patient again over the next 30 days.   Provider Signature Regina Eck, PharmD, BCPS Clinical Pharmacist, [facility] [place]: [phone]

## 2019-12-31 NOTE — Patient Instructions (Signed)
Visit Information  Goals Addressed            This Visit's Progress     Patient Stated   . I would like to control my diabetes (pt-stated)       Current Barriers:  . Diabetes: T2DM; most recent A1c increased to 12.5%  . Current antihyperglycemic regimen: Toujeo 42 units qHS, Humalog sliding scale with meals (0-15 units)   o Denies hypoglycemic symptoms; Denies hyperglycemic symptoms o Unable to gauge patient's compliance; A1c does not match with patient-reported compliance. o MD referral for endocrine o Samples provided for patient (basaglar and humalog)--Toujeo was not available.  Patient has lost medicaid and is unable to afford medications. . Current exercise: n/a; encouraged patient to exercise as able and as recommended by PCP.  Patient is now working 3-4 hours per day. . Current blood glucose readings:  o Reports that her blood sugars are "coming down".  Strongly recommended patient to take medications as prescribed and abide by diet/lifestyle . Cardiovascular risk reduction: o Current hypertensive regimen: o Current hyperlipidemia regimen: Pravastatin 40mg  daily. Patient states she does miss doses "every now and then", but she actually feels worse when she misses statin.  Encouraged patient to continue taking statin as prescribed as there are positive benefits for its use in diabetes patients.  Pharmacist Clinical Goal(s):  [name] Over the next 90 days, patient will work with PharmD & PCP to address needs related to optimized medication management of chronic conditions  Interventions: . Comprehensive medication review performed, medication list updated in electronic medical record.   . Reviewed & discussed the following diabetes-related information with patient: o Follow ADA recommended "diabetes-friendly" diet  (reviewed healthy snack/food options) o Discussed insulin injection technique; Patient uses AccuCheck glucometer  o Reviewed medication purpose/side effects-->patient  denies adverse events  Patient Self Care Activities:  . Patient will check blood glucose daily , document, and provide at future appointments . Patient will focus on medication adherence by continuing to take medications as prescribed . Patient will take medications as prescribed . Patient will contact provider with any episodes of hypoglycemia . Patient will report any questions or concerns to provider   Please see past updates related to this goal by clicking on the "Past Updates" button in the selected goal         The patient verbalized understanding of instructions provided today and declined a print copy of patient instruction materials.   The care management team will reach out to the patient again over the next 30 days.   SIGNATURE Regina Eck, PharmD, BCPS Clinical Pharmacist, [facility] [place]: [phone]

## 2020-01-03 ENCOUNTER — Other Ambulatory Visit: Payer: Self-pay | Admitting: Internal Medicine

## 2020-01-04 NOTE — Telephone Encounter (Signed)
Cyclobenzaprine refill 

## 2020-01-07 ENCOUNTER — Telehealth: Payer: Self-pay

## 2020-01-07 NOTE — Telephone Encounter (Signed)
I returned the pt's call to notify her of her last lab results.

## 2020-01-18 DIAGNOSIS — E119 Type 2 diabetes mellitus without complications: Secondary | ICD-10-CM | POA: Diagnosis not present

## 2020-01-18 DIAGNOSIS — Z944 Liver transplant status: Secondary | ICD-10-CM | POA: Diagnosis not present

## 2020-01-18 DIAGNOSIS — J90 Pleural effusion, not elsewhere classified: Secondary | ICD-10-CM | POA: Diagnosis not present

## 2020-01-18 DIAGNOSIS — K754 Autoimmune hepatitis: Secondary | ICD-10-CM | POA: Diagnosis not present

## 2020-01-18 DIAGNOSIS — D849 Immunodeficiency, unspecified: Secondary | ICD-10-CM | POA: Diagnosis not present

## 2020-01-18 DIAGNOSIS — K7469 Other cirrhosis of liver: Secondary | ICD-10-CM | POA: Diagnosis not present

## 2020-01-18 DIAGNOSIS — T8649 Other complications of liver transplant: Secondary | ICD-10-CM | POA: Diagnosis not present

## 2020-01-18 DIAGNOSIS — K7400 Hepatic fibrosis, unspecified: Secondary | ICD-10-CM | POA: Diagnosis not present

## 2020-01-18 DIAGNOSIS — Z8619 Personal history of other infectious and parasitic diseases: Secondary | ICD-10-CM | POA: Diagnosis not present

## 2020-01-21 ENCOUNTER — Telehealth: Payer: Self-pay

## 2020-01-25 DIAGNOSIS — Z944 Liver transplant status: Secondary | ICD-10-CM | POA: Diagnosis not present

## 2020-01-25 DIAGNOSIS — Z298 Encounter for other specified prophylactic measures: Secondary | ICD-10-CM | POA: Diagnosis not present

## 2020-01-25 DIAGNOSIS — D849 Immunodeficiency, unspecified: Secondary | ICD-10-CM | POA: Diagnosis not present

## 2020-01-26 ENCOUNTER — Telehealth: Payer: Self-pay

## 2020-01-26 ENCOUNTER — Ambulatory Visit: Payer: Self-pay

## 2020-01-26 DIAGNOSIS — E1165 Type 2 diabetes mellitus with hyperglycemia: Secondary | ICD-10-CM

## 2020-01-26 DIAGNOSIS — I10 Essential (primary) hypertension: Secondary | ICD-10-CM

## 2020-01-26 NOTE — Chronic Care Management (AMB) (Signed)
  Chronic Care Management   Outreach Note  01/26/2020 Name: Kathryn Walsh MRN: [id number] DOB: 1956/04/22  Referred by: Glendale Chard, MD Reason for referral : Care Coordination   SW placed an unsuccessful outbound call to the patient to assist with care coordination needs. SW left a HIPAA compliant voice message requesting a return call.  Follow Up Plan: The care management team will reach out to the patient again over the next 14 days.   Daneen Schick, BSW, CDP Social Worker, Certified Dementia Practitioner [place] [facility] [place] [facility] [phone]

## 2020-02-08 ENCOUNTER — Ambulatory Visit: Payer: Self-pay

## 2020-02-08 DIAGNOSIS — E1165 Type 2 diabetes mellitus with hyperglycemia: Secondary | ICD-10-CM

## 2020-02-08 NOTE — Chronic Care Management (AMB) (Signed)
  Chronic Care Management    Social Work Follow Up Note  02/08/2020 Name: Kathryn Walsh MRN: [id number] DOB: 1956/10/25  Kathryn Walsh is a 64 y.o. year old female who is a primary care patient of Kathryn Chard, MD. The CCM team was consulted for assistance with care coordination.   Review of patient status, including review of consultants reports, other relevant assessments, and collaboration with appropriate care team members and the patient's provider was performed as part of comprehensive patient evaluation and provision of chronic care management services.    SW placed a successful outbound call to the patient to assess for ongoing care coordination needs. Determined the patient is still experiencing difficulty with medication costs, specifically the cost of insulin, Advised the patient Kathryn Walsh, PharmD has transitioned to an alternate role and is no longer embedded as a member of the patients primary care team. Informed the patient SW would place PharmD referral for medication assistance.    Outpatient Encounter Medications as of 02/08/2020  Medication Sig Note  . amLODipine (NORVASC) 5 MG tablet TAKE 1 TABLET BY MOUTH EVERY DAY   . aspirin 81 MG chewable tablet Chew 81 mg by mouth daily.   [name] azaTHIOprine (IMURAN) 50 MG tablet Take 100 mg by mouth 2 (two) times daily.    . Blood Glucose Monitoring Suppl (ACCU-CHEK AVIVA PLUS) w/Device KIT Use as directed to check blood sugars 3 times per day dx:e11.65   . Cholecalciferol (VITAMIN D HIGH POTENCY) 25 MCG (1000 UT) capsule Take 4,000 Units by mouth daily.   . cyclobenzaprine (FLEXERIL) 10 MG tablet TAKE 1 TABLET BY MOUTH EVERYDAY AT BEDTIME AS NEEDED FOR BACK PAIN   . Insulin Glargine, 2 Unit Dial, (TOUJEO MAX SOLOSTAR) 300 UNIT/ML SOPN Inject 42 Units into the skin at bedtime. (Patient taking differently: Inject 32 Units into the skin at bedtime. ) 12/28/2019: 12/28/19 Patient reports she is taking 40 units at bedtime  . Insulin  Lispro (HUMALOG KWIKPEN) 200 UNIT/ML SOPN Inject 6 Units into the skin 2 (two) times daily before a meal. Please inject 6 units before breakfast and dinner; max dose titration 40 units daily   . pravastatin (PRAVACHOL) 40 MG tablet TAKE 1 TABLET BY MOUTH EVERYDAY AT BEDTIME   . propranolol (INDERAL) 20 MG tablet Take 20 mg by mouth 2 (two) times daily.   . tacrolimus (PROGRAF) 1 MG capsule Take 2 mg in am and 3 mg in pm   . Turmeric 500 MG TABS Take by mouth.    No facility-administered encounter medications on file as of 02/08/2020.     Follow Up Plan: SW will follow up with patient by phone over the next month.   Kathryn Walsh, BSW, CDP Social Worker, Certified Dementia Practitioner [place] [facility] [place] [facility] [phone]

## 2020-02-11 ENCOUNTER — Ambulatory Visit: Payer: Self-pay | Admitting: *Deleted

## 2020-02-11 NOTE — Chronic Care Management (AMB) (Signed)
  Chronic Care Management   Note  02/11/2020 Name: Kathryn Walsh MRN: [id number] DOB: 1956/05/11  Central Pharm[facility] referral made for assistance with insulin assistance/diabetes medication management.   Follow up plan: The [facility] team will follow up with this patient and provide direct communication to the PCP.   [place] Management Coordinator Direct Dial:  [phone]  Fax: [phone]

## 2020-02-15 DIAGNOSIS — Z944 Liver transplant status: Secondary | ICD-10-CM | POA: Diagnosis not present

## 2020-02-15 DIAGNOSIS — D849 Immunodeficiency, unspecified: Secondary | ICD-10-CM | POA: Diagnosis not present

## 2020-02-15 DIAGNOSIS — J90 Pleural effusion, not elsewhere classified: Secondary | ICD-10-CM | POA: Diagnosis not present

## 2020-02-15 DIAGNOSIS — C220 Liver cell carcinoma: Secondary | ICD-10-CM | POA: Diagnosis not present

## 2020-02-16 ENCOUNTER — Other Ambulatory Visit: Payer: Self-pay | Admitting: Pharmacy Technician

## 2020-02-16 NOTE — Patient Outreach (Signed)
[facility])   02/16/2020  Jahara Clucas 08/31/1956 [id number]                                       Medication Assistance Referral  Referral From: [place]   Medication/Company: [employer] and Tyler Aas / [employer] Patient application portion:  Education officer, museum portion: Faxed  to Dr. Johnnye Lana Provider address/fax verified via: Office website   Follow up:  Will follow up with patient in 10-14 business days to confirm application(s) have been received.  Maud Deed Chana Bode, [place] [address] Certified Pharmacy Technician [employer]

## 2020-02-17 ENCOUNTER — Other Ambulatory Visit: Payer: Self-pay | Admitting: Internal Medicine

## 2020-02-18 ENCOUNTER — Telehealth: Payer: Self-pay

## 2020-02-18 ENCOUNTER — Ambulatory Visit: Payer: Self-pay

## 2020-02-18 DIAGNOSIS — I10 Essential (primary) hypertension: Secondary | ICD-10-CM

## 2020-02-18 DIAGNOSIS — E1165 Type 2 diabetes mellitus with hyperglycemia: Secondary | ICD-10-CM

## 2020-02-18 NOTE — Chronic Care Management (AMB) (Signed)
  Chronic Care Management   Outreach Note  02/18/2020 Name: Kathryn Walsh MRN: [id number] DOB: [birth year]  Referred by: Glendale Chard, MD Reason for referral : Care Coordination   SW placed an unsuccessful outbound call to the patient to assist with care coordination needs. Unfortunately, the patients voice mailbox was full which prohibited SW from leaving a HIPAA compliant voice message requesting a return call.  Follow Up Plan: The care management team will reach out to the patient again over the next 10 days.   Daneen Schick, BSW, CDP Social Worker, Certified Dementia Practitioner [place] [facility] [place] [facility] [phone]

## 2020-02-23 ENCOUNTER — Other Ambulatory Visit: Payer: Self-pay | Admitting: Internal Medicine

## 2020-02-23 DIAGNOSIS — Z1231 Encounter for screening mammogram for malignant neoplasm of breast: Secondary | ICD-10-CM

## 2020-02-24 ENCOUNTER — Telehealth: Payer: Self-pay

## 2020-02-24 ENCOUNTER — Ambulatory Visit: Payer: Self-pay

## 2020-02-24 DIAGNOSIS — I10 Essential (primary) hypertension: Secondary | ICD-10-CM

## 2020-02-24 DIAGNOSIS — E1165 Type 2 diabetes mellitus with hyperglycemia: Secondary | ICD-10-CM

## 2020-02-24 NOTE — Chronic Care Management (AMB) (Signed)
  Chronic Care Management   Outreach Note  02/24/2020 Name: Kathryn Walsh MRN: [id number] DOB: 04-09-1956  Referred by: Glendale Chard, MD Reason for referral : Care Coordination   SW placed a second unsuccessful outbound call to the patient to assist with care coordination needs. Unfortunately, the patients voice mailbox is full which prohibited SW from leaving a HIPAA compliant voice message requesting a return call.  Follow Up Plan: SW will attempt a third and final outreach over the next 10 days.  Daneen Schick, BSW, CDP Social Worker, Certified Dementia Practitioner [place] [facility] [place] [facility] [phone]

## 2020-03-01 ENCOUNTER — Other Ambulatory Visit: Payer: Self-pay | Admitting: Pharmacy Technician

## 2020-03-01 DIAGNOSIS — Z0101 Encounter for examination of eyes and vision with abnormal findings: Secondary | ICD-10-CM | POA: Diagnosis not present

## 2020-03-01 DIAGNOSIS — E109 Type 1 diabetes mellitus without complications: Secondary | ICD-10-CM | POA: Diagnosis not present

## 2020-03-01 NOTE — Patient Outreach (Signed)
[facility])  03/01/2020  Kathryn Walsh Apr 04, 1956 [id number]  Received provider portion(s) of patient assistance application(s) for [place] and Novolin R. Faxed completed application and required documents into [employer].  Will follow up with company(ies) in 10-14 business days to check status of application(s).  Maud Deed Chana Bode, [place] Certified Pharmacy Technician [employer]

## 2020-03-07 ENCOUNTER — Ambulatory Visit: Payer: Self-pay

## 2020-03-07 ENCOUNTER — Telehealth: Payer: Self-pay

## 2020-03-07 DIAGNOSIS — I10 Essential (primary) hypertension: Secondary | ICD-10-CM

## 2020-03-07 DIAGNOSIS — E1165 Type 2 diabetes mellitus with hyperglycemia: Secondary | ICD-10-CM

## 2020-03-07 NOTE — Chronic Care Management (AMB) (Signed)
  Chronic Care Management   Outreach Note  03/07/2020 Name: Kathryn Walsh MRN: [id number] DOB: [birth year]  Referred by: Glendale Chard, MD Reason for referral : Care Coordination   SW placed a third unsuccessful outbound call to the patient to assist with care coordination needs. SW unable to leave voice message due to patients voice mailbox continuing to be full. SW to close community resource related goals due to an inability to maintain patient contact. The patient will remain active with embedded RN Care Manager and Pharmacy team.  Follow Up Plan: Follow up with RN Care Manager scheduled for 03/18/20. SW signing off at this time.  Daneen Schick, BSW, CDP Social Worker, Certified Dementia Practitioner [place] [facility] [place] [facility] [phone]

## 2020-03-18 ENCOUNTER — Telehealth: Payer: Self-pay

## 2020-03-19 ENCOUNTER — Other Ambulatory Visit: Payer: Self-pay | Admitting: Internal Medicine

## 2020-03-23 ENCOUNTER — Telehealth: Payer: Self-pay

## 2020-03-23 NOTE — Telephone Encounter (Signed)
Spoke w/pt she will stop by to pick up patient assistant medication

## 2020-04-12 ENCOUNTER — Ambulatory Visit (INDEPENDENT_AMBULATORY_CARE_PROVIDER_SITE_OTHER): Payer: Medicare Other | Admitting: Internal Medicine

## 2020-04-12 ENCOUNTER — Encounter: Payer: Self-pay | Admitting: Internal Medicine

## 2020-04-12 ENCOUNTER — Other Ambulatory Visit (HOSPITAL_COMMUNITY)
Admission: RE | Admit: 2020-04-12 | Discharge: 2020-04-12 | Disposition: A | Discharge status: Home / Self Care | Payer: Medicare Other | Source: Ambulatory Visit | Attending: Internal Medicine | Admitting: Internal Medicine

## 2020-04-12 ENCOUNTER — Other Ambulatory Visit: Payer: Self-pay

## 2020-04-12 VITALS — BP 144/76 | HR 76 | Temp 98.9°F | Ht 66.6 in | Wt 164.6 lb

## 2020-04-12 DIAGNOSIS — Z0000 Encounter for general adult medical examination without abnormal findings: Secondary | ICD-10-CM

## 2020-04-12 DIAGNOSIS — Z1151 Encounter for screening for human papillomavirus (HPV): Secondary | ICD-10-CM | POA: Insufficient documentation

## 2020-04-12 DIAGNOSIS — E1165 Type 2 diabetes mellitus with hyperglycemia: Secondary | ICD-10-CM

## 2020-04-12 DIAGNOSIS — Z780 Asymptomatic menopausal state: Secondary | ICD-10-CM | POA: Diagnosis not present

## 2020-04-12 DIAGNOSIS — Z01419 Encounter for gynecological examination (general) (routine) without abnormal findings: Secondary | ICD-10-CM | POA: Diagnosis present

## 2020-04-12 DIAGNOSIS — I10 Essential (primary) hypertension: Secondary | ICD-10-CM

## 2020-04-12 LAB — POCT UA - MICROALBUMIN
Albumin/Creatinine Ratio, Urine, POC: >300
Creatinine, POC: 50 mg/dL
Microalbumin Ur, POC: 150 mg/L

## 2020-04-12 LAB — POCT URINALYSIS DIPSTICK
Bilirubin, UA: NEGATIVE
Glucose, UA: NEGATIVE
Ketones, UA: NEGATIVE
Leukocytes, UA: NEGATIVE
Nitrite, UA: NEGATIVE
Protein, UA: POSITIVE — AB
Spec Grav, UA: 1.025 (ref 1.010–1.025)
Urobilinogen, UA: 1 E.U./dL
pH, UA: 6 (ref 5.0–8.0)

## 2020-04-12 LAB — POC HEMOCCULT BLD/STL (OFFICE/1-CARD/DIAGNOSTIC)
Card #1 Date: 6152021
Fecal Occult Blood, POC: NEGATIVE

## 2020-04-12 NOTE — Patient Instructions (Signed)
Health Maintenance, Female Adopting a healthy lifestyle and getting preventive care are important in promoting health and wellness. Ask your health care provider about:  The right schedule for you to have regular tests and exams.  Things you can do on your own to prevent diseases and keep yourself healthy. What should I know about diet, weight, and exercise? Eat a healthy diet   Eat a diet that includes plenty of vegetables, fruits, low-fat dairy products, and lean protein.  Do not eat a lot of foods that are high in solid fats, added sugars, or sodium. Maintain a healthy weight Body mass index (BMI) is used to identify weight problems. It estimates body fat based on height and weight. Your health care provider can help determine your BMI and help you achieve or maintain a healthy weight. Get regular exercise Get regular exercise. This is one of the most important things you can do for your health. Most adults should:  Exercise for at least 150 minutes each week. The exercise should increase your heart rate and make you sweat (moderate-intensity exercise).  Do strengthening exercises at least twice a week. This is in addition to the moderate-intensity exercise.  Spend less time sitting. Even light physical activity can be beneficial. Watch cholesterol and blood lipids Have your blood tested for lipids and cholesterol at 64 years of age, then have this test every 5 years. Have your cholesterol levels checked more often if:  Your lipid or cholesterol levels are high.  You are older than 64 years of age.  You are at high risk for heart disease. What should I know about cancer screening? Depending on your health history and family history, you may need to have cancer screening at various ages. This may include screening for:  Breast cancer.  Cervical cancer.  Colorectal cancer.  Skin cancer.  Lung cancer. What should I know about heart disease, diabetes, and high blood  pressure? Blood pressure and heart disease  High blood pressure causes heart disease and increases the risk of stroke. This is more likely to develop in people who have high blood pressure readings, are of African descent, or are overweight.  Have your blood pressure checked: ? Every 3-5 years if you are 18-39 years of age. ? Every year if you are 40 years old or older. Diabetes Have regular diabetes screenings. This checks your fasting blood sugar level. Have the screening done:  Once every three years after age 40 if you are at a normal weight and have a low risk for diabetes.  More often and at a younger age if you are overweight or have a high risk for diabetes. What should I know about preventing infection? Hepatitis B If you have a higher risk for hepatitis B, you should be screened for this virus. Talk with your health care provider to find out if you are at risk for hepatitis B infection. Hepatitis C Testing is recommended for:  Everyone born from 1945 through 1965.  Anyone with known risk factors for hepatitis C. Sexually transmitted infections (STIs)  Get screened for STIs, including gonorrhea and chlamydia, if: ? You are sexually active and are younger than 64 years of age. ? You are older than 64 years of age and your health care provider tells you that you are at risk for this type of infection. ? Your sexual activity has changed since you were last screened, and you are at increased risk for chlamydia or gonorrhea. Ask your health care provider if   you are at risk.  Ask your health care provider about whether you are at high risk for HIV. Your health care provider may recommend a prescription medicine to help prevent HIV infection. If you choose to take medicine to prevent HIV, you should first get tested for HIV. You should then be tested every 3 months for as long as you are taking the medicine. Pregnancy  If you are about to stop having your period (premenopausal) and  you may become pregnant, seek counseling before you get pregnant.  Take 400 to 800 micrograms (mcg) of folic acid every day if you become pregnant.  Ask for birth control (contraception) if you want to prevent pregnancy. Osteoporosis and menopause Osteoporosis is a disease in which the bones lose minerals and strength with aging. This can result in bone fractures. If you are 65 years old or older, or if you are at risk for osteoporosis and fractures, ask your health care provider if you should:  Be screened for bone loss.  Take a calcium or vitamin D supplement to lower your risk of fractures.  Be given hormone replacement therapy (HRT) to treat symptoms of menopause. Follow these instructions at home: Lifestyle  Do not use any products that contain nicotine or tobacco, such as cigarettes, e-cigarettes, and chewing tobacco. If you need help quitting, ask your health care provider.  Do not use street drugs.  Do not share needles.  Ask your health care provider for help if you need support or information about quitting drugs. Alcohol use  Do not drink alcohol if: ? Your health care provider tells you not to drink. ? You are pregnant, may be pregnant, or are planning to become pregnant.  If you drink alcohol: ? Limit how much you use to 0-1 drink a day. ? Limit intake if you are breastfeeding.  Be aware of how much alcohol is in your drink. In the [place] one drink equals one 12 oz bottle of beer (355 mL), one 5 oz glass of wine (148 mL), or one 1 oz glass of hard liquor (44 mL). General instructions  Schedule regular health, dental, and eye exams.  Stay current with your vaccines.  Tell your health care provider if: ? You often feel depressed. ? You have ever been abused or do not feel safe at home. Summary  Adopting a healthy lifestyle and getting preventive care are important in promoting health and wellness.  Follow your health care provider's instructions about healthy  diet, exercising, and getting tested or screened for diseases.  Follow your health care provider's instructions on monitoring your cholesterol and blood pressure. This information is not intended to replace advice given to you by your health care provider. Make sure you discuss any questions you have with your health care provider. Document Revised: 10/08/2018 Document Reviewed: 10/08/2018 Elsevier Patient Education  2020 [employer]  

## 2020-04-12 NOTE — Progress Notes (Signed)
This visit occurred during the SARS-CoV-2 public health emergency.  Safety protocols were in place, including screening questions prior to the visit, additional usage of staff PPE, and extensive cleaning of exam room while observing appropriate contact time as indicated for disinfecting solutions.  Subjective:     Patient ID: Kathryn Walsh , female    DOB: 14-Nov-1955 , 64 y.o.   MRN: [id number]   Chief Complaint  Patient presents with  . Annual Exam  . Diabetes  . Hypertension    HPI  She is here today for a full physical exam. She is no longer followed by GYN for her pelvic exams. She is willing to have a pelvic exam performed today.   Diabetes She presents for her follow-up diabetic visit. She has type 2 diabetes mellitus. Her disease course has been worsening. There are no hypoglycemic associated symptoms. Pertinent negatives for diabetes include no blurred vision and no chest pain. There are no hypoglycemic complications. Risk factors for coronary artery disease include diabetes mellitus, dyslipidemia, hypertension and sedentary lifestyle. Her weight is decreasing steadily. She is following a diabetic diet. She participates in exercise intermittently. Her breakfast blood glucose is taken between 9-10 am. Her breakfast blood glucose range is generally 140-180 mg/dl.  Hypertension This is a chronic problem. The current episode started more than 1 year ago. The problem has been gradually improving since onset. The problem is controlled. Pertinent negatives include no blurred vision, chest pain, palpitations or shortness of breath. Risk factors for coronary artery disease include diabetes mellitus, dyslipidemia and post-menopausal state.     Past Medical History:  Diagnosis Date  . Diabetes mellitus without complication ([place])   . High cholesterol   . HTN (hypertension)   . Liver transplant recipient [facility])      Family History  Problem Relation Age of Onset  . Healthy Mother   .  Healthy Father      Current Outpatient Medications:  .  amLODipine (NORVASC) 5 MG tablet, TAKE 1 TABLET BY MOUTH EVERY DAY, Disp: 90 tablet, Rfl: 2 .  aspirin 81 MG chewable tablet, Chew 81 mg by mouth daily., Disp: , Rfl:  .  Blood Glucose Monitoring Suppl (ACCU-CHEK AVIVA PLUS) w/Device KIT, Use as directed to check blood sugars 3 times per day dx:e11.65, Disp: 1 kit, Rfl: 1 .  Cholecalciferol (VITAMIN D HIGH POTENCY) 25 MCG (1000 UT) capsule, Take 4,000 Units by mouth daily., Disp: , Rfl:  .  insulin degludec (TRESIBA FLEXTOUCH) 200 UNIT/ML FlexTouch Pen, Inject 42 Units into the skin. Pt doing 32 units qhs, Disp: , Rfl:  .  Insulin Lispro (HUMALOG KWIKPEN) 200 UNIT/ML SOPN, Inject 6 Units into the skin 2 (two) times daily before a meal. Please inject 6 units before breakfast and dinner; max dose titration 40 units daily, Disp: 5 pen, Rfl: 5 .  propranolol (INDERAL) 20 MG tablet, Take 20 mg by mouth 2 (two) times daily., Disp: , Rfl:  .  tacrolimus (PROGRAF) 1 MG capsule, Take 2 mg in am and 3 mg in pm, Disp: , Rfl:  .  Turmeric 500 MG TABS, Take by mouth., Disp: , Rfl:  .  azaTHIOprine (IMURAN) 50 MG tablet, Take 100 mg by mouth 2 (two) times daily.  (Patient not taking: Reported on 04/11/2020), Disp: , Rfl:    No Known Allergies    The patient states she uses post menopausal status for birth control. Last LMP was No LMP recorded. Patient is postmenopausal.. Negative for Dysmenorrhea Negative for: breast  discharge, breast lump(s), breast pain and breast self exam. Associated symptoms include abnormal vaginal bleeding. Pertinent negatives include abnormal bleeding (hematology), anxiety, decreased libido, depression, difficulty falling sleep, dyspareunia, history of infertility, nocturia, sexual dysfunction, sleep disturbances, urinary incontinence, urinary urgency, vaginal discharge and vaginal itching. Diet regular.The patient states her exercise level is  intermittent.   . The patient's  tobacco use is:  Social History   Tobacco Use  Smoking Status Current Some Day Smoker  . Packs/day: 0.25  . Years: 12.00  . Pack years: 3.00  . Types: Cigarettes  Smokeless Tobacco Never Used  Tobacco Comment   encouraged to avoid smoking in the house and in her car  . She has been exposed to passive smoke. The patient's alcohol use is:  Social History   Substance and Sexual Activity  Alcohol Use No   Review of Systems  Constitutional: Negative.   HENT: Negative.   Eyes: Negative.  Negative for blurred vision.  Respiratory: Negative.  Negative for shortness of breath.   Cardiovascular: Negative.  Negative for chest pain and palpitations.  Endocrine: Negative.   Genitourinary: Negative.   Musculoskeletal: Negative.   Skin: Negative.   Allergic/Immunologic: Negative.   Neurological: Negative.   Hematological: Negative.   Psychiatric/Behavioral: Negative.      Today's Vitals   04/12/20 0907  BP: (!) 144/76  Pulse: 76  Temp: 98.9 F (37.2 C)  TempSrc: Oral  Weight: 164 lb 9.6 oz (74.7 kg)  Height: 5' 6.6" (1.692 m)  PainSc: 0-No pain   Body mass index is 26.09 kg/m.   Objective:  Physical Exam Vitals and nursing note reviewed. Exam conducted with a chaperone present.  Constitutional:      Appearance: Normal appearance.  HENT:     Head: Normocephalic and atraumatic.     Right Ear: Tympanic membrane, ear canal and external ear normal.     Left Ear: Tympanic membrane, ear canal and external ear normal.     Nose:     Comments: Deferred, masked    Mouth/Throat:     Comments: Deferred, masked Eyes:     Extraocular Movements: Extraocular movements intact.     Conjunctiva/sclera: Conjunctivae normal.     Pupils: Pupils are equal, round, and reactive to light.  Cardiovascular:     Rate and Rhythm: Normal rate and regular rhythm.     Pulses: Normal pulses.          Dorsalis pedis pulses are 2+ on the right side and 2+ on the left side.     Heart sounds: Normal  heart sounds.  Pulmonary:     Effort: Pulmonary effort is normal.     Breath sounds: Normal breath sounds.  Chest:     Breasts: Tanner Score is 5.        Right: Normal.        Left: Normal.  Abdominal:     General: Abdomen is flat. Bowel sounds are normal.     Palpations: Abdomen is soft.     Hernia: There is no hernia in the left inguinal area or right inguinal area.  Genitourinary:    General: Normal vulva.     Exam position: Lithotomy position.     Tanner stage (genital): 5.     Labia:        Right: No rash or tenderness.        Left: No rash or tenderness.      Vagina: Normal.     Cervix: Normal.  Uterus: Normal.      Adnexa: Right adnexa normal and left adnexa normal.     Rectum: Normal. Guaiac result negative. No tenderness.     Comments: Atrophic mucosa Musculoskeletal:        General: Normal range of motion.     Cervical back: Normal range of motion and neck supple.  Feet:     Right foot:     Protective Sensation: 5 sites tested. 5 sites sensed.     Skin integrity: Dry skin present.     Toenail Condition: Right toenails are normal.     Left foot:     Protective Sensation: 5 sites tested. 5 sites sensed.     Skin integrity: Dry skin present.     Toenail Condition: Left toenails are normal.  Lymphadenopathy:     Lower Body: No right inguinal adenopathy. No left inguinal adenopathy.  Skin:    General: Skin is warm and dry.  Neurological:     General: No focal deficit present.     Mental Status: She is alert and oriented to person, place, and time.  Psychiatric:        Mood and Affect: Mood normal.        Behavior: Behavior normal.         Assessment And Plan:     1. Routine general medical examination at health care facility  A full exam was performed.  Importance of monthly self breast exams was discussed with the patient. PATIENT IS ADVISED TO GET 30-45 MINUTES REGULAR EXERCISE NO LESS THAN FOUR TO FIVE DAYS PER WEEK - BOTH WEIGHTBEARING EXERCISES AND  AEROBIC ARE RECOMMENDED.  SHE IS ADVISED TO FOLLOW A HEALTHY DIET WITH AT LEAST SIX FRUITS/VEGGIES PER DAY, DECREASE INTAKE OF RED MEAT, AND TO INCREASE FISH INTAKE TO TWO DAYS PER WEEK.  MEATS/FISH SHOULD NOT BE FRIED, BAKED OR BROILED IS PREFERABLE.  I SUGGEST WEARING SPF 50 SUNSCREEN ON EXPOSED PARTS AND ESPECIALLY WHEN IN THE DIRECT SUNLIGHT FOR AN EXTENDED PERIOD OF TIME.  PLEASE AVOID FAST FOOD RESTAURANTS AND INCREASE YOUR WATER INTAKE.   2. Cervical smear, as part of routine gynecological examination  Pap smear performed. Stool heme negative.   - Cytology -Pap Smear  3. Uncontrolled type 2 diabetes mellitus with hyperglycemia (HCC)  Diabetic foot exam was performed. I DISCUSSED WITH THE PATIENT AT LENGTH REGARDING THE GOALS OF GLYCEMIC CONTROL AND POSSIBLE LONG-TERM COMPLICATIONS.  I  ALSO STRESSED THE IMPORTANCE OF COMPLIANCE WITH HOME GLUCOSE MONITORING, DIETARY RESTRICTIONS INCLUDING AVOIDANCE OF SUGARY DRINKS/PROCESSED FOODS,  ALONG WITH REGULAR EXERCISE.  I  ALSO STRESSED THE IMPORTANCE OF ANNUAL EYE EXAMS, SELF FOOT CARE AND COMPLIANCE WITH OFFICE VISITS.  - POCT Urinalysis Dipstick (81002) - POCT UA - Microalbumin - CMP14+EGFR - CBC - Lipid panel - Hemoglobin A1c - Ambulatory referral to Ophthalmology  4. Essential hypertension  Chronic, uncontrolled. EKG performed, NSR w/o acute changes. She will continue with current meds for now. She is encouraged to avoid adding salt to her foods.   - EKG 12-Lead   Maximino Greenland, MD    THE PATIENT IS ENCOURAGED TO PRACTICE SOCIAL DISTANCING DUE TO THE COVID-19 PANDEMIC.

## 2020-04-13 LAB — CBC
Hematocrit: 37.8 % (ref 34.0–46.6)
Hemoglobin: 12.5 g/dL (ref 11.1–15.9)
MCH: 30.3 pg (ref 26.6–33.0)
MCHC: 33.1 g/dL (ref 31.5–35.7)
MCV: 92 fL (ref 79–97)
Platelets: 130 10*3/uL — ABNORMAL LOW (ref 150–450)
RBC: 4.12 x10E6/uL (ref 3.77–5.28)
RDW: 14.3 % (ref 11.7–15.4)
WBC: 4.3 10*3/uL (ref 3.4–10.8)

## 2020-04-13 LAB — CMP14+EGFR
ALT: 7 IU/L (ref 0–32)
AST: 15 IU/L (ref 0–40)
Albumin/Globulin Ratio: 1.4 (ref 1.2–2.2)
Albumin: 4.1 g/dL (ref 3.8–4.8)
Alkaline Phosphatase: 155 IU/L — ABNORMAL HIGH (ref 48–121)
BUN/Creatinine Ratio: 14 (ref 12–28)
BUN: 9 mg/dL (ref 8–27)
Bilirubin Total: 0.6 mg/dL (ref 0.0–1.2)
CO2: 24 mmol/L (ref 20–29)
Calcium: 9.2 mg/dL (ref 8.7–10.3)
Chloride: 102 mmol/L (ref 96–106)
Creatinine, Ser: 0.66 mg/dL (ref 0.57–1.00)
GFR calc Af Amer: 108 mL/min/{1.73_m2} (ref >59)
GFR calc non Af Amer: 94 mL/min/{1.73_m2} (ref >59)
Globulin, Total: 3 g/dL (ref 1.5–4.5)
Glucose: 127 mg/dL — ABNORMAL HIGH (ref 65–99)
Potassium: 4.5 mmol/L (ref 3.5–5.2)
Sodium: 139 mmol/L (ref 134–144)
Total Protein: 7.1 g/dL (ref 6.0–8.5)

## 2020-04-13 LAB — HEMOGLOBIN A1C
Est. average glucose Bld gHb Est-mCnc: 151 mg/dL
Hgb A1c MFr Bld: 6.9 % — ABNORMAL HIGH (ref 4.8–5.6)

## 2020-04-13 LAB — CYTOLOGY - PAP
Comment: NEGATIVE
Diagnosis: NEGATIVE
High risk HPV: NEGATIVE

## 2020-04-13 LAB — LIPID PANEL
Chol/HDL Ratio: 3.6 ratio (ref 0.0–4.4)
Cholesterol, Total: 143 mg/dL (ref 100–199)
HDL: 40 mg/dL (ref >39)
LDL Chol Calc (NIH): 69 mg/dL (ref 0–99)
Triglycerides: 202 mg/dL — ABNORMAL HIGH (ref 0–149)
VLDL Cholesterol Cal: 34 mg/dL (ref 5–40)

## 2020-04-16 ENCOUNTER — Other Ambulatory Visit: Payer: Self-pay | Admitting: Internal Medicine

## 2020-04-16 NOTE — Progress Notes (Unsigned)
diflucan 

## 2020-04-18 ENCOUNTER — Other Ambulatory Visit: Payer: Self-pay

## 2020-04-18 ENCOUNTER — Telehealth: Payer: Self-pay

## 2020-04-18 ENCOUNTER — Ambulatory Visit: Payer: Self-pay

## 2020-04-18 DIAGNOSIS — E1165 Type 2 diabetes mellitus with hyperglycemia: Secondary | ICD-10-CM

## 2020-04-18 DIAGNOSIS — I10 Essential (primary) hypertension: Secondary | ICD-10-CM

## 2020-04-18 NOTE — Telephone Encounter (Signed)
-----   Message from Glendale Chard, MD sent at 04/16/2020  3:00 PM EDT ----- Let her know I am unable to prescribe oral meds for yeast b/c it interferes with her transplant meds. Have her use monistat 3 day cream over the counter.

## 2020-04-18 NOTE — Telephone Encounter (Signed)
Patient notified

## 2020-04-19 ENCOUNTER — Other Ambulatory Visit: Payer: Self-pay | Admitting: Pharmacy Technician

## 2020-04-19 NOTE — Patient Outreach (Signed)
[place])   04/19/2020  Kathryn Walsh [birth year] [id number]   Per encounter in [place], patient has been approved for Novolog and [place] thru [employer] and medications were delivered to providers office.  Will remove myself from care team  Maud Deed. Chana Bode, [place] Certified Pharmacy Technician [employer]

## 2020-04-19 NOTE — Chronic Care Management (AMB) (Signed)
  Chronic Care Management   Outreach Note  04/19/2020 Name: Kathryn Walsh MRN: [id number] DOB: Dec 12, 1955  Referred by: Glendale Chard, MD Reason for referral : Chronic Care Management (FU Call - DM patient ed mats)   An unsuccessful telephone outreach was attempted today. The patient was referred to the case management team for assistance with care management and care coordination.   Follow Up Plan: Telephone follow up appointment with care management team member scheduled for: 05/31/20  Barb Merino, RN, BSN, CCM Care Management Coordinator [place] [facility]  Direct Phone: [phone]

## 2020-05-16 DIAGNOSIS — E119 Type 2 diabetes mellitus without complications: Secondary | ICD-10-CM | POA: Diagnosis not present

## 2020-05-16 LAB — HM DIABETES EYE EXAM

## 2020-05-18 DIAGNOSIS — H35463 Secondary vitreoretinal degeneration, bilateral: Secondary | ICD-10-CM | POA: Diagnosis not present

## 2020-05-18 DIAGNOSIS — H33321 Round hole, right eye: Secondary | ICD-10-CM | POA: Diagnosis not present

## 2020-05-18 DIAGNOSIS — H43823 Vitreomacular adhesion, bilateral: Secondary | ICD-10-CM | POA: Diagnosis not present

## 2020-05-25 DIAGNOSIS — H33321 Round hole, right eye: Secondary | ICD-10-CM | POA: Diagnosis not present

## 2020-05-31 ENCOUNTER — Other Ambulatory Visit: Payer: Self-pay

## 2020-05-31 ENCOUNTER — Telehealth: Payer: Medicare Other

## 2020-05-31 ENCOUNTER — Ambulatory Visit (INDEPENDENT_AMBULATORY_CARE_PROVIDER_SITE_OTHER): Payer: Medicare Other

## 2020-05-31 DIAGNOSIS — I10 Essential (primary) hypertension: Secondary | ICD-10-CM

## 2020-05-31 DIAGNOSIS — E1165 Type 2 diabetes mellitus with hyperglycemia: Secondary | ICD-10-CM

## 2020-05-31 MED ORDER — ACCU-CHEK AVIVA PLUS W/DEVICE KIT: PACK | 1 refills | Status: DC

## 2020-06-01 NOTE — Chronic Care Management (AMB) (Signed)
Chronic Care Management   Follow Up Note   06/01/2020 Name: Kathryn Walsh MRN: [id number] DOB: 08-17-1956  Referred by: Glendale Chard, MD Reason for referral : Chronic Care Management (FU RN CM Call )   Kathryn Walsh is a 64 y.o. year old female who is a primary care patient of Glendale Chard, MD. The CCM team was consulted for assistance with chronic disease management and care coordination needs.    Review of patient status, including review of consultants reports, relevant laboratory and other test results, and collaboration with appropriate care team members and the patient's provider was performed as part of comprehensive patient evaluation and provision of chronic care management services.    SDOH (Social Determinants of Health) assessments performed: Yes - no acute challenges noted at this time  See Care Plan activities for detailed interventions related to [facility])  Placed outbound follow up call to patient for a CCM RN CM care plan update.      Outpatient Encounter Medications as of 05/31/2020  Medication Sig  . amLODipine (NORVASC) 5 MG tablet TAKE 1 TABLET BY MOUTH EVERY DAY  . aspirin 81 MG chewable tablet Chew 81 mg by mouth daily.  [name] azaTHIOprine (IMURAN) 50 MG tablet Take 100 mg by mouth 2 (two) times daily.  (Patient not taking: Reported on 04/11/2020)  . Cholecalciferol (VITAMIN D HIGH POTENCY) 25 MCG (1000 UT) capsule Take 4,000 Units by mouth daily.  . insulin degludec (TRESIBA FLEXTOUCH) 200 UNIT/ML FlexTouch Pen Inject 42 Units into the skin. Pt doing 32 units qhs  . Insulin Lispro (HUMALOG KWIKPEN) 200 UNIT/ML SOPN Inject 6 Units into the skin 2 (two) times daily before a meal. Please inject 6 units before breakfast and dinner; max dose titration 40 units daily  . propranolol (INDERAL) 20 MG tablet Take 20 mg by mouth 2 (two) times daily.  . tacrolimus (PROGRAF) 1 MG capsule Take 2 mg in am and 3 mg in pm  . Turmeric 500 MG TABS Take by mouth.  . [DISCONTINUED]  Blood Glucose Monitoring Suppl (ACCU-CHEK AVIVA PLUS) w/Device KIT Use as directed to check blood sugars 3 times per day dx:e11.65   No facility-administered encounter medications on file as of 05/31/2020.     Objective:  Lab Results  Component Value Date   HGBA1C 6.9 (H) 04/12/2020   HGBA1C 12.0 (H) 12/21/2019   HGBA1C 9.4 (H) 07/02/2019   Lab Results  Component Value Date   MICROALBUR 150 04/12/2020   LDLCALC 69 04/12/2020   CREATININE 0.66 04/12/2020   BP Readings from Last 3 Encounters:  04/12/20 (!) 144/76  12/21/19 126/70  07/02/19 138/70    Goals Addressed      Patient Stated   .  "I would like to lower my A1C" (pt-stated)        CARE PLAN ENTRY (see longtitudinal plan of care for additional care plan information)  Current Barriers:  [name] Knowledge Deficits related to disease process and Self Health management of DM . Chronic Disease Management support and education needs related to DMII, HTN  Nurse Case Manager Clinical Goal(s):  [name] Over the next 90 days, patient will work with the CCM team and PCP to address needs related to disease education and support to improve Self Health management of DM AEB patient will lower her A1C <12.0 . Over the next 60 days, patient will complete a new patient appointment with Endocrinology for management of DM - Goal Not Met referral not placed by PCP  CCM RN CM  Interventions:  05/31/20 spoke to patient  . Evaluation of current treatment plan related to Diabetes and patient's adherence to plan as established by provider. . Provided education to patient re: recent A1C is down to 6.9 % a decrease from 12.0 % 5 months ago; Positive reinforcement given to patient for making lifestyle changes and adhering to her DM treatment plan; re-educated to patient with rationale regarding target A1C of <7.0; Re-educated to patient ways to maintain this goal through ongoing medication adherence, following a diabetic friendly diet and implementing exercise  into daily routine  . Reviewed medications with patient and discussed the indication, dosage and frequency of patient's prescribed medication regimen; patient reports adherence with no missed doses . Provided patient with printed educational materials related to Diabetes Care Guide; Grocery Shopping with Diabetes; Prevent Complications from Diabetes . Advised patient, providing education and rationale, to check cbg daily before breakfast and record, calling the CCM team and or PCP for findings outside established parameters; Reviewed daily glycemic range FBS 80-130, <180 after meals  . Discussed plans with patient for ongoing care management follow up and provided patient with direct contact information for care management team    Patient Self Care Activities:  . Patient verbalizes understanding of plan to notify the CCM team and or PCP for reoccurring hypoglycemic events . Self administers medications as prescribed . Attends all scheduled provider appointments . Calls pharmacy for medication refills . Performs ADL's independently . Performs IADL's independently . Calls provider office for new concerns or questions   Initial goal documentation and Please see past updates related to this goal by clicking on the "Past Updates" button in the selected goal      .  "to lower my Triglycerides" (pt-stated)        CARE PLAN ENTRY (see longitudinal plan of care for additional care plan information)  Current Barriers:  [name] Knowledge Deficits related to disease process and Self Health management of elevated Triglycerides . Chronic Disease Management support and education needs related to DMT2, HTN  Nurse Case Manager Clinical Goal(s):  [name] Over the next 90 days, patient will work with the [place] CM and PCP to address needs related to disease education and support to improve Self Health management of Hypertriglyceridemia  CCM RN CM Interventions:  05/31/20 call completed with patient   . Inter-disciplinary care team collaboration (see longitudinal plan of care) . Evaluation of current treatment plan related to Hypertriglyceridemia and patient's adherence to plan as established by provider. . Provided education to patient re: recent Triglyceride is elevated to 202; educated on target range  . Discussed plans with patient for ongoing care management follow up and provided patient with direct contact information for care management team . Provided patient with printed  educational materials related to Triglycerides: Why do they matter?; 13 Cholesterol Lowering Foods   Patient Self Care Activities:  . Self administers medications as prescribed . Attends all scheduled provider appointments . Calls pharmacy for medication refills . Calls provider office for new concerns or questions  Initial goal documentation     .  COMPLETED: I would like to control my diabetes (pt-stated)        Current Barriers:  . Diabetes: T2DM; most recent A1c increased to 12.5%  . Current antihyperglycemic regimen: Toujeo 42 units qHS, Humalog sliding scale with meals (0-15 units)   o Denies hypoglycemic symptoms; Denies hyperglycemic symptoms o Unable to gauge patient's compliance; A1c does not match with patient-reported compliance. o MD referral for  endocrine o Samples provided for patient (basaglar and humalog)--Toujeo was not available.  Patient has lost medicaid and is unable to afford medications. . Current exercise: n/a; encouraged patient to exercise as able and as recommended by PCP.  Patient is now working 3-4 hours per day. . Current blood glucose readings:  o Reports that her blood sugars are "coming down".  Strongly recommended patient to take medications as prescribed and abide by diet/lifestyle . Cardiovascular risk reduction: o Current hypertensive regimen: o Current hyperlipidemia regimen: Pravastatin 74m daily. Patient states she does miss doses "every now and then", but she  actually feels worse when she misses statin.  Encouraged patient to continue taking statin as prescribed as there are positive benefits for its use in diabetes patients.  Pharmacist Clinical Goal(s):  .Marland KitchenOver the next 90 days, patient will work with PharmD & PCP to address needs related to optimized medication management of chronic conditions  Interventions: . Comprehensive medication review performed, medication list updated in electronic medical record.   . Reviewed & discussed the following diabetes-related information with patient: o Follow ADA recommended "diabetes-friendly" diet  (reviewed healthy snack/food options) o Discussed insulin injection technique; Patient uses AccuCheck glucometer  o Reviewed medication purpose/side effects-->patient denies adverse events  Patient Self Care Activities:  . Patient will check blood glucose daily , document, and provide at future appointments . Patient will focus on medication adherence by continuing to take medications as prescribed . Patient will take medications as prescribed . Patient will contact provider with any episodes of hypoglycemia . Patient will report any questions or concerns to provider   Please see past updates related to this goal by clicking on the "Past Updates" button in the selected goal        Plan:   Telephone follow up appointment with care management team member scheduled for:   ABarb Merino RN, BSN, CCM Care Management Coordinator [facility]  Direct Phone: [phone]

## 2020-06-01 NOTE — Patient Instructions (Signed)
Visit Information  Goals Addressed      Patient Stated   .  "I would like to lower my A1C" (pt-stated)        CARE PLAN ENTRY (see longtitudinal plan of care for additional care plan information)  Current Barriers:  [name] Knowledge Deficits related to disease process and Self Health management of DM . Chronic Disease Management support and education needs related to DMII, HTN  Nurse Case Manager Clinical Goal(s):  [name] Over the next 90 days, patient will work with the CCM team and PCP to address needs related to disease education and support to improve Self Health management of DM AEB patient will lower her A1C <12.0 . Over the next 60 days, patient will complete a new patient appointment with Endocrinology for management of DM - Goal Not Met referral not placed by PCP  CCM RN CM Interventions:  05/31/20 spoke to patient  . Evaluation of current treatment plan related to Diabetes and patient's adherence to plan as established by provider. . Provided education to patient re: recent A1C is down to 6.9 % a decrease from 12.0 % 5 months ago; Positive reinforcement given to patient for making lifestyle changes and adhering to her DM treatment plan; re-educated to patient with rationale regarding target A1C of <7.0; Re-educated to patient ways to maintain this goal through ongoing medication adherence, following a diabetic friendly diet and implementing exercise into daily routine  . Reviewed medications with patient and discussed the indication, dosage and frequency of patient's prescribed medication regimen; patient reports adherence with no missed doses . Provided patient with printed educational materials related to Diabetes Care Guide; Grocery Shopping with Diabetes; Prevent Complications from Diabetes . Advised patient, providing education and rationale, to check cbg daily before breakfast and record, calling the CCM team and or PCP for findings outside established parameters; Reviewed daily glycemic  range FBS 80-130, <180 after meals  . Discussed plans with patient for ongoing care management follow up and provided patient with direct contact information for care management team    Patient Self Care Activities:  . Patient verbalizes understanding of plan to notify the CCM team and or PCP for reoccurring hypoglycemic events . Self administers medications as prescribed . Attends all scheduled provider appointments . Calls pharmacy for medication refills . Performs ADL's independently . Performs IADL's independently . Calls provider office for new concerns or questions   Initial goal documentation and Please see past updates related to this goal by clicking on the "Past Updates" button in the selected goal      .  "to lower my Triglycerides" (pt-stated)        CARE PLAN ENTRY (see longitudinal plan of care for additional care plan information)  Current Barriers:  [name] Knowledge Deficits related to disease process and Self Health management of elevated Triglycerides . Chronic Disease Management support and education needs related to DMT2, HTN  Nurse Case Manager Clinical Goal(s):  [name] Over the next 90 days, patient will work with the [place] CM and PCP to address needs related to disease education and support to improve Self Health management of Hypertriglyceridemia  CCM RN CM Interventions:  05/31/20 call completed with patient  . Inter-disciplinary care team collaboration (see longitudinal plan of care) . Evaluation of current treatment plan related to Hypertriglyceridemia and patient's adherence to plan as established by provider. . Provided education to patient re: recent Triglyceride is elevated to 202; educated on target range  . Discussed plans with patient for  ongoing care management follow up and provided patient with direct contact information for care management team . Provided patient with printed  educational materials related to Triglycerides: Why do they matter?; 13  Cholesterol Lowering Foods   Patient Self Care Activities:  . Self administers medications as prescribed . Attends all scheduled provider appointments . Calls pharmacy for medication refills . Calls provider office for new concerns or questions  Initial goal documentation     .  COMPLETED: I would like to control my diabetes (pt-stated)        Current Barriers:  . Diabetes: T2DM; most recent A1c increased to 12.5%  . Current antihyperglycemic regimen: Toujeo 42 units qHS, Humalog sliding scale with meals (0-15 units)   o Denies hypoglycemic symptoms; Denies hyperglycemic symptoms o Unable to gauge patient's compliance; A1c does not match with patient-reported compliance. o MD referral for endocrine o Samples provided for patient (basaglar and humalog)--Toujeo was not available.  Patient has lost medicaid and is unable to afford medications. . Current exercise: n/a; encouraged patient to exercise as able and as recommended by PCP.  Patient is now working 3-4 hours per day. . Current blood glucose readings:  o Reports that her blood sugars are "coming down".  Strongly recommended patient to take medications as prescribed and abide by diet/lifestyle . Cardiovascular risk reduction: o Current hypertensive regimen: o Current hyperlipidemia regimen: Pravastatin 63m daily. Patient states she does miss doses "every now and then", but she actually feels worse when she misses statin.  Encouraged patient to continue taking statin as prescribed as there are positive benefits for its use in diabetes patients.  Pharmacist Clinical Goal(s):  .Marland KitchenOver the next 90 days, patient will work with PharmD & PCP to address needs related to optimized medication management of chronic conditions  Interventions: . Comprehensive medication review performed, medication list updated in electronic medical record.   . Reviewed & discussed the following diabetes-related information with patient: o Follow ADA  recommended "diabetes-friendly" diet  (reviewed healthy snack/food options) o Discussed insulin injection technique; Patient uses AccuCheck glucometer  o Reviewed medication purpose/side effects-->patient denies adverse events  Patient Self Care Activities:  . Patient will check blood glucose daily , document, and provide at future appointments . Patient will focus on medication adherence by continuing to take medications as prescribed . Patient will take medications as prescribed . Patient will contact provider with any episodes of hypoglycemia . Patient will report any questions or concerns to provider   Please see past updates related to this goal by clicking on the "Past Updates" button in the selected goal        Patient verbalizes understanding of instructions provided today.   Telephone follow up appointment with care management team member scheduled for: 07/25/20  ABarb Merino RN, BSN, CCM Care Management Coordinator [facility]  Direct Phone: [phone]

## 2020-06-19 ENCOUNTER — Other Ambulatory Visit: Payer: Self-pay | Admitting: Internal Medicine

## 2020-06-22 DIAGNOSIS — H33321 Round hole, right eye: Secondary | ICD-10-CM | POA: Diagnosis not present

## 2020-06-22 DIAGNOSIS — H31091 Other chorioretinal scars, right eye: Secondary | ICD-10-CM | POA: Diagnosis not present

## 2020-07-06 ENCOUNTER — Ambulatory Visit: Payer: Medicare Other | Admitting: Internal Medicine

## 2020-07-11 ENCOUNTER — Telehealth: Payer: Self-pay

## 2020-07-11 ENCOUNTER — Telehealth: Payer: Self-pay | Admitting: *Deleted

## 2020-07-11 NOTE — Chronic Care Management (AMB) (Signed)
  Chronic Care Management   Note  07/11/2020 Name: Kathryn Walsh MRN: [id number] DOB: June 03, 1956  Kathryn Walsh is a 64 y.o. year old female who is a primary care patient of Glendale Chard, MD and is actively engaged with the care management team. I reached out to Roselyn Reef by phone today to assist with scheduling an initial visit with the Pharmacist.  Follow up plan: Telephone appointment with care management team member scheduled for:  07/15/2020  [place] Management

## 2020-07-11 NOTE — Chronic Care Management (AMB) (Signed)
    Chronic Care Management Pharmacy Assistant   Name: Kathryn Walsh  MRN: [id number] DOB: 11-24-1955  Reason for Encounter: Medication Review / Patient assistance Coordination   PCP : Glendale Chard, MD  Allergies:  No Known Allergies  Medications: Outpatient Encounter Medications as of 07/11/2020  Medication Sig  . amLODipine (NORVASC) 5 MG tablet TAKE 1 TABLET BY MOUTH EVERY DAY  . aspirin 81 MG chewable tablet Chew 81 mg by mouth daily.  [name] azaTHIOprine (IMURAN) 50 MG tablet Take 100 mg by mouth 2 (two) times daily.  (Patient not taking: Reported on 04/11/2020)  . Blood Glucose Monitoring Suppl (ACCU-CHEK AVIVA PLUS) w/Device KIT Use as directed to check blood sugars 3 times per day dx:e11.65  . Cholecalciferol (VITAMIN D HIGH POTENCY) 25 MCG (1000 UT) capsule Take 4,000 Units by mouth daily.  . insulin degludec (TRESIBA FLEXTOUCH) 200 UNIT/ML FlexTouch Pen Inject 42 Units into the skin. Pt doing 32 units qhs  . Insulin Lispro (HUMALOG KWIKPEN) 200 UNIT/ML SOPN Inject 6 Units into the skin 2 (two) times daily before a meal. Please inject 6 units before breakfast and dinner; max dose titration 40 units daily  . pravastatin (PRAVACHOL) 40 MG tablet TAKE 1 TABLET BY MOUTH EVERYDAY AT BEDTIME  . propranolol (INDERAL) 20 MG tablet Take 20 mg by mouth 2 (two) times daily.  . tacrolimus (PROGRAF) 1 MG capsule Take 2 mg in am and 3 mg in pm  . Turmeric 500 MG TABS Take by mouth.   No facility-administered encounter medications on file as of 07/11/2020.    Current Diagnosis: Patient Active Problem List   Diagnosis Date Noted  . Pap smear for cervical cancer screening 01/04/2014  . Chronic pain syndrome 01/04/2014  . Diabetes ([place]) 10/06/2013  . Depression 10/06/2013  . Hyperglycemia 07/31/2013  . Cellulitis and abscess 07/30/2013  . Immunosuppressed status ([place]) 07/30/2013  . HTN (hypertension) 07/22/2013  . S/P liver transplant ([place]) 07/11/2013  . HCV (hepatitis C virus)  07/11/2013  . Type II diabetes mellitus, uncontrolled ([place]) 07/11/2013      Follow-Up:  Patient Assistance Coordination - Reorder form Filled out to [employer] For Tresiba 200 units FlexTouch Pen . Awaiting Provider signature to fax.  07/22/2020 [employer], to check on status of patient's medication. Patient needs to submit new application, with income, new insurance card. Patient was approved last medication under the Covid enrollment.  Kathryn Walsh, CPP aware.   Kathryn Walsh, [facility] Clinical Pharmacist Assistant [phone]

## 2020-07-15 ENCOUNTER — Other Ambulatory Visit: Payer: Self-pay

## 2020-07-15 ENCOUNTER — Ambulatory Visit: Payer: Medicare Other

## 2020-07-15 DIAGNOSIS — I10 Essential (primary) hypertension: Secondary | ICD-10-CM

## 2020-07-15 DIAGNOSIS — E1165 Type 2 diabetes mellitus with hyperglycemia: Secondary | ICD-10-CM

## 2020-07-15 NOTE — Chronic Care Management (AMB) (Signed)
Chronic Care Management Pharmacy  Name: Kathryn Walsh  MRN: [id number] DOB: 01-19-1956  Chief Complaint/ HPI  Kathryn Walsh,  64 y.o. , female presents for their Initial CCM visit with the clinical pharmacist via telephone due to COVID-19 Pandemic.  PCP : Glendale Chard, MD  Their chronic conditions include: Hypertension, Hyperlipidemia, Diabetes and Depression  Office Visits: 04/18/20 Telephone call: Advised pt to use Monistat 3 day cream over the counter for yeast infection. Cannot prescribe oral medications due to interaction with transplant medications.  04/12/20 OV: Annual exam. DM/HTN check. Hgb A1c down to 6.9%. Blood count, liver function and kidney function stable. LDL at goal. Triglycerides are elevated, cut back on sugar intake. Pap smear is normal, positive for yeast. Stool heme negative. Diabetic foot exam performed. EKG performed, NSR w/o acute changes.   03/23/20 Telephone call: Pt advised pt assistance medications ready to pick up at office  Consult Visits: 07/13/20 Transplant Telephone call: Pt called stating she cannot take Envarsus due to it causing pain on right side where her drains had been. Pt advised she could be switched back to tacrolimus but it is not covered by PAP. Update labs prior to switching.  06/07/20 Transplant telephone call: Pt has 3 weeks of tacrolimus left and then will switch to Envarsus. Notified PAP approved. Pt will needs labs 2 weeks after switching medications.   04/20/20 Patient approved for Envarsus XR through [employer] patient assistance program. Medication will be shipped to pt's home. Approved through 03/30/21.  01/18/20 [facility]: Blood flow in normal direction in the major hepatic transplant blood vessels.   CCM Encounters: 04/19/20 CPhT: Cira Servant and Tyler Aas approved through [employer]. Medications delivered to provider office  03/01/20 CPhT: Completed application for [place] and Novolin R faxed to [employer]  02/16/20 CPhT: Pt  mailed pt portion of PAP application for [place] and Novolog. PCP faxed provider portion of PAP application.   Medications: Outpatient Encounter Medications as of 07/15/2020  Medication Sig   amLODipine (NORVASC) 5 MG tablet TAKE 1 TABLET BY MOUTH EVERY DAY   aspirin 81 MG chewable tablet Chew 81 mg by mouth daily.   azaTHIOprine (IMURAN) 50 MG tablet Take 100 mg by mouth 2 (two) times daily.    Cholecalciferol (VITAMIN D HIGH POTENCY) 25 MCG (1000 UT) capsule Take 4,000 Units by mouth daily.   insulin degludec (TRESIBA FLEXTOUCH) 200 UNIT/ML FlexTouch Pen Inject 42 Units into the skin. Pt doing 32 units qhs   Insulin Lispro (HUMALOG KWIKPEN) 200 UNIT/ML SOPN Inject 6 Units into the skin 2 (two) times daily before a meal. Please inject 6 units before breakfast and dinner; max dose titration 40 units daily   pravastatin (PRAVACHOL) 40 MG tablet TAKE 1 TABLET BY MOUTH EVERYDAY AT BEDTIME   propranolol (INDERAL) 20 MG tablet Take 20 mg by mouth 2 (two) times daily. Once daily   tacrolimus (PROGRAF) 1 MG capsule Take 2 mg in am and 3 mg in pm   Turmeric 500 MG TABS Take by mouth.   [DISCONTINUED] Blood Glucose Monitoring Suppl (ACCU-CHEK AVIVA PLUS) w/Device KIT Use as directed to check blood sugars 3 times per day dx:e11.65   No facility-administered encounter medications on file as of 07/15/2020.    Current Diagnosis/Assessment:  SDOH Interventions     Most Recent Value  SDOH Interventions  Financial Strain Interventions Other (Comment)  [Assist with reordering Tresiba from patient assistance program]      Goals Addressed  This Visit's Progress    Pharmacy Care Plan       CARE PLAN ENTRY (see longitudinal plan of care for additional care plan information)  Current Barriers:   Chronic Disease Management support, education, and care coordination needs related to Hypertension, Hyperlipidemia, and Diabetes   Hypertension BP Readings from Last 3 Encounters:    04/12/20 (!) 144/76  12/21/19 126/70  07/02/19 138/70    Pharmacist Clinical Goal(s): o Over the next 90 days, patient will work with PharmD and providers to maintain BP goal <130/80  Current regimen:  o Amlodipine 72m daily o Propanolol 265mtwice daily  Interventions: o Recommend patient check BP at home occasionally and if symptomatic o Determined patient was only taking propranolol once daily - Advised patient to start taking propranolol twice daily as directed  Patient self care activities - Over the next 90 days, patient will: o Check BP weekly and if symptomatic, document, and provide at future appointments o Ensure daily salt intake < 2300 mg/day o Take propranolol 2046mwice daily as directed o Increase exercise as able with a goal of 30 minutes daily 5 times per week (150 minutes per week)  Hyperlipidemia Lab Results  Component Value Date/Time   LDLCALC 69 04/12/2020 09:52 AM    Pharmacist Clinical Goal(s): o Over the next 180 days, patient will work with PharmD and providers to maintain LDL goal < 70  Current regimen:  o Pravastatin 52m15mily at bedtime  Interventions: o Discussed appropriate goals for LDL (less than 70), HDL (greater than 50) and triglycerides (less than 150) o Provided dietary and exercise recommendations  Patient self care activities - Over the next 180 days, patient will: o Limit fried foods and fatty foods o Increase exercise as able with a goal of 30 minutes daily 5 times per week (150 minutes per week)  Diabetes Lab Results  Component Value Date/Time   HGBA1C 6.9 (H) 04/12/2020 09:52 AM   HGBA1C 12.0 (H) 12/21/2019 04:43 PM    Pharmacist Clinical Goal(s): o Over the next 90 days, patient will work with PharmD and providers to maintain A1c goal <7%  Current regimen:  o Tresiba 42 units at bedtime o Humalog Kwikpen 6 units twice daily before a meal  Interventions: o Provided dietary and exercise recommendations o Assist  with reordering refill for Tresiba from patient assistance program o Recommend patient check blood sugar if she wakes up in the middle of the night sweating o Collaborate with PCP staff to send in refill for Accu Check Guide Me test strips for blood glucose meter Discussed hypoglycemia treatment (juice, hard candy, etc)  Patient self care activities - Over the next 90 days, patient will: o Check blood sugar twice daily, document, and provide at future appointments o Contact provider with any episodes of hypoglycemia o Increase exercise as able with a goal of 30 minutes daily 5 times per week (150 minutes per week) o Focus on well-balanced meal utilizing the PLATE method  Medication management  Pharmacist Clinical Goal(s): o Over the next 90 days, patient will work with PharmD and providers to maintain optimal medication adherence  Current pharmacy: [facility]  Interventions o Comprehensive medication review performed. o Continue current medication management strategy o Collaborate with PCP staff to send in refill for cyclobenzaprine o Recommend patient receive Shingrix vaccine o Recommend DEXA scan and Vitamin D level  Patient self care activities - Over the next 90 days, patient will: o Focus on medication adherence by considering using  a pill box o Take medications as prescribed o Report any questions or concerns to PharmD and/or provider(s)  Initial goal documentation        Diabetes   A1c goal <7%  Recent Relevant Labs: Lab Results  Component Value Date/Time   HGBA1C 6.9 (H) 04/12/2020 09:52 AM   HGBA1C 12.0 (H) 12/21/2019 04:43 PM   MICROALBUR 150 04/12/2020 09:55 AM   MICROALBUR 150 04/07/2019 11:35 AM    Last diabetic Eye exam: No results found for: HMDIABEYEEXA  Last diabetic Foot exam: 04/12/20  Checking BG: 2x per Day  Recent FBG Readings: 122 Recent pre-meal BG readings: 194, 187 Recent 2hr PP BG readings:   Recent HS BG readings:   Patient has  failed these meds in past: Lantus, Toujeo Patient is currently controlled on the following medications:  Tresiba 42 units at bedtime  Humalog Kwikpen 6 units twice daily before a meal  We discussed:   Diet extensively o Pt states her diet is pretty good o She loves vegetables o Breakfast: Toast, boiled eggs, water o Lunch: Vegetables o Recommend well-balanced meals  - PLATE method in detail o Pt loves fried chicken - Recommend decreasing fried foods o Limit soda  Exercise extensively o Does stretching o Pt states she has scoliosis o Pt says she cannot overwork herself or her back will give out o 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) o Recommend pt get 30 minutes of moderate intensity exercise daily 5 times a week (150 minutes total per week)  Sometimes pt administers less insulin (no less than 32 units)  Uses Humalog per sliding scale   Pt mentions having some instances at night when she wakes up sweating o Recommend check BG when this happens  Hypoglycemia treatment  Assisting with refill/reorder for Tresiba from [employer] patient assistance program  Plan Continue current medications  Collaborate with PCP staff for prescription for test strips for Accu Check Guide Me BG meter  Hyperlipidemia   LDL goal < 70  Lipid Panel     Component Value Date/Time   CHOL 143 04/12/2020 0952   TRIG 202 (H) 04/12/2020 0952   HDL 40 04/12/2020 0952   LDLCALC 69 04/12/2020 0952    Hepatic Function Latest Ref Rng & Units 04/12/2020 12/21/2019 04/07/2019  Total Protein 6.0 - 8.5 g/dL 7.1 6.8 7.6  Albumin 3.8 - 4.8 g/dL 4.1 3.7(L) 4.3  AST 0 - 40 IU/L '15 13 13  ' ALT 0 - 32 IU/L '7 10 9  ' Alk Phosphatase 48 - 121 IU/L 155(H) 184(H) 198(H)  Total Bilirubin 0.0 - 1.2 mg/dL 0.6 0.3 0.6  Bilirubin, Direct 0.00 - 0.40 mg/dL - - 0.19    The 10-year ASCVD risk score Mikey Bussing DC Jr., et al., 2013) is: 39%   Values used to calculate the score:     Age: 55 years      Sex: Female     Is Non-Hispanic African American: Yes     Diabetic: Yes     Tobacco smoker: Yes     Systolic Blood Pressure: 974 mmHg     Is BP treated: Yes     HDL Cholesterol: 40 mg/dL     Total Cholesterol: 143 mg/dL   Patient has failed these meds in past: N/A Patient is currently controlled on the following medications:   Pravastatin 37m daily at bedtime  We discussed:    Goals for LDL, HDL, and triglycerides  Triglycerides elevated o Recommend limiting fried and  fatty foods  Plan Continue current medications   Hypertension   BP goal is:  <130/80  Office blood pressures are  BP Readings from Last 3 Encounters:  04/12/20 (!) 144/76  12/21/19 126/70  07/02/19 138/70   Patient checks BP at home infrequently Patient home BP readings are ranging: None to provide  Patient has failed these meds in the past: N/A Patient is currently controlled on the following medications:   Amlodipine 71m daily  Propanolol 253mtwice daily  We discussed:  Pt says she has never had a big problem with her BP  Recommend check BP occasionally at home and then if symptomatic   Pt only taking propranolol once daily o She will start taking twice daily as directed  Plan Continue current medications   S/P Liver Transplant   Patient has failed these meds in past: Envarsus XR Patient is currently controlled on the following medications:   Azathioprine 2 tablets twice daily  Tacrolimus 2m28mn morning and 2mg28m the evening  We discussed:    Pt mentioned experiencing significant pain at the site of last liver biopsy with Envarsus and so she stopped taking  [facility] has been notified and she is going to go have labs done today or Monday before they switch her back to tacrolimus  Pt restarted tacrolimus that she had left over  Follow up appointment with [facility] on 9/28  Pt understands the importance of taking anti-rejection medications  Plan Continue current  medications  Osteopenia / Osteoporosis   Last DEXA Scan: Ordered 07/10/18, expired  T-Score femoral neck:   T-Score total hip:   T-Score lumbar spine:   T-Score forearm radius:   10-year probability of major osteoporotic fracture:   10-year probability of hip fracture:   No results found for: VD25OH   Patient is not a candidate for pharmacologic treatment  Patient has failed these meds in past: N/A Patient is currently controlled on the following medications:   Cholecalciferol 2000 units 2 capsules daily  Plan Continue current medications  Recommend DEXA scan and Vitamin D level Osteoporosis counseling at follow up  Health Maintenance   Patient is currently on the following medications:   Aspirin 81mg65mwable tablet daily  Turmeric 500mg 71mblets daily  Cyclobenzaprine 10mg a74meded   We discussed:    Pt taking cyclobenzaprine once daily  Plan Continue current medications  Collaborate with PCP regarding refill for cyclobenzaprine  Vaccines   Reviewed and discussed patient's vaccination history.    Immunization History  Administered Date(s) Administered   DTaP 06/10/2012   Influenza,inj,Quad PF,6+ Mos 10/06/2013, 08/12/2015, 08/01/2018, 07/02/2019   Influenza-Unspecified 08/18/2012, 08/06/2018   Moderna SARS-COVID-2 Vaccination 11/03/2019, 12/01/2019, 07/26/2020   Pneumococcal Conjugate-13 08/12/2015   Pneumococcal Polysaccharide-23 07/13/2013, 12/16/2018   We discussed:  Pt had shingles about 4 years ago  Pt getting flu shot on the 9/28  Recommend Shingrix series  Plan Recommended patient receive Shingrix vaccine series at local pharmacy  Medication Management   [facility] Uses pill box? No - Will consider using Pt endorses 100% compliance  We discussed:   Importance of taking each medications daily as directed  Medication synchronization, adherence packaging, and delivery with [facility]  pharmacy  Plan Continue current medication management strategy    Follow up: 2 month phone visit  CourtneJannette FogoD Clinical Pharmacist [facility] [phone]

## 2020-07-21 DIAGNOSIS — Z944 Liver transplant status: Secondary | ICD-10-CM | POA: Diagnosis not present

## 2020-07-21 DIAGNOSIS — D849 Immunodeficiency, unspecified: Secondary | ICD-10-CM | POA: Diagnosis not present

## 2020-07-26 ENCOUNTER — Telehealth: Payer: Self-pay

## 2020-07-26 ENCOUNTER — Telehealth: Payer: Medicare Other

## 2020-07-26 DIAGNOSIS — Z944 Liver transplant status: Secondary | ICD-10-CM | POA: Diagnosis not present

## 2020-07-26 DIAGNOSIS — D696 Thrombocytopenia, unspecified: Secondary | ICD-10-CM | POA: Diagnosis not present

## 2020-07-26 DIAGNOSIS — Z5181 Encounter for therapeutic drug level monitoring: Secondary | ICD-10-CM | POA: Diagnosis not present

## 2020-07-26 DIAGNOSIS — Z23 Encounter for immunization: Secondary | ICD-10-CM | POA: Diagnosis not present

## 2020-07-26 DIAGNOSIS — K7400 Hepatic fibrosis, unspecified: Secondary | ICD-10-CM | POA: Diagnosis not present

## 2020-07-26 DIAGNOSIS — D84821 Immunodeficiency due to drugs: Secondary | ICD-10-CM | POA: Diagnosis not present

## 2020-07-26 DIAGNOSIS — K754 Autoimmune hepatitis: Secondary | ICD-10-CM | POA: Diagnosis not present

## 2020-07-26 DIAGNOSIS — F17210 Nicotine dependence, cigarettes, uncomplicated: Secondary | ICD-10-CM | POA: Diagnosis not present

## 2020-07-26 DIAGNOSIS — T8649 Other complications of liver transplant: Secondary | ICD-10-CM | POA: Diagnosis not present

## 2020-07-26 DIAGNOSIS — R7989 Other specified abnormal findings of blood chemistry: Secondary | ICD-10-CM | POA: Diagnosis not present

## 2020-07-26 NOTE — Telephone Encounter (Cosign Needed)
  Chronic Care Management   Outreach Note  07/26/2020 Name: Kathryn Walsh MRN: [id number] DOB: [birth year]  Referred by: Glendale Chard, MD Reason for referral : Chronic Care Management ([facility] RN CM Call )   An unsuccessful telephone outreach was attempted today. The patient was referred to the case management team for assistance with care management and care coordination.   Follow Up Plan: A HIPAA compliant phone message was left for the patient providing contact information and requesting a return call.  Telephone follow up appointment with care management team member scheduled for: 09/01/20  Barb Merino, RN, BSN, CCM Care Management Coordinator [place] [facility]  Direct Phone: [phone]

## 2020-07-28 ENCOUNTER — Other Ambulatory Visit: Payer: Self-pay

## 2020-07-28 MED ORDER — ACCU-CHEK GUIDE ME W/DEVICE KIT: PACK | 3 refills | Status: DC

## 2020-07-28 MED ORDER — ACCU-CHEK GUIDE VI STRP: ORAL_STRIP | 12 refills | Status: DC

## 2020-07-28 NOTE — Patient Instructions (Addendum)
Visit Information  Goals Addressed            This Visit's Progress   . Pharmacy Care Plan       CARE PLAN ENTRY (see longitudinal plan of care for additional care plan information)  Current Barriers:  . Chronic Disease Management support, education, and care coordination needs related to Hypertension, Hyperlipidemia, and Diabetes   Hypertension BP Readings from Last 3 Encounters:  04/12/20 (!) 144/76  12/21/19 126/70  07/02/19 138/70   . Pharmacist Clinical Goal(s): o Over the next 90 days, patient will work with PharmD and providers to maintain BP goal <130/80 . Current regimen:  o Amlodipine 5mg  daily o Propanolol 20mg  twice daily . Interventions: o Recommend patient check BP at home occasionally and if symptomatic o Determined patient was only taking propranolol once daily - Advised patient to start taking propranolol twice daily as directed . Patient self care activities - Over the next 90 days, patient will: o Check BP weekly and if symptomatic, document, and provide at future appointments o Ensure daily salt intake < 2300 mg/day o Take propranolol 20mg  twice daily as directed o Increase exercise as able with a goal of 30 minutes daily 5 times per week (150 minutes per week)  Hyperlipidemia Lab Results  Component Value Date/Time   LDLCALC 69 04/12/2020 09:52 AM   . Pharmacist Clinical Goal(s): o Over the next 180 days, patient will work with PharmD and providers to maintain LDL goal < 70 . Current regimen:  o Pravastatin 40mg  daily at bedtime . Interventions: o Discussed appropriate goals for LDL (less than 70), HDL (greater than 50) and triglycerides (less than 150) o Provided dietary and exercise recommendations . Patient self care activities - Over the next 180 days, patient will: o Limit fried foods and fatty foods o Increase exercise as able with a goal of 30 minutes daily 5 times per week (150 minutes per week)  Diabetes Lab Results  Component Value  Date/Time   HGBA1C 6.9 (H) 04/12/2020 09:52 AM   HGBA1C 12.0 (H) 12/21/2019 04:43 PM   . Pharmacist Clinical Goal(s): o Over the next 90 days, patient will work with PharmD and providers to maintain A1c goal <7% . Current regimen:  o Tresiba 42 units at bedtime o Humalog Kwikpen 6 units twice daily before a meal . Interventions: o Provided dietary and exercise recommendations o Assist with reordering refill for Tresiba from patient assistance program o Recommend patient check blood sugar if she wakes up in the middle of the night sweating o Collaborate with PCP staff to send in refill for Accu Check Guide Me test strips for blood glucose meter Discussed hypoglycemia treatment (juice, hard candy, etc) . Patient self care activities - Over the next 90 days, patient will: o Check blood sugar twice daily, document, and provide at future appointments o Contact provider with any episodes of hypoglycemia o Increase exercise as able with a goal of 30 minutes daily 5 times per week (150 minutes per week) o Focus on well-balanced meal utilizing the PLATE method  Medication management . Pharmacist Clinical Goal(s): o Over the next 90 days, patient will work with PharmD and providers to maintain optimal medication adherence . Current pharmacy: [facility] . Interventions o Comprehensive medication review performed. o Continue current medication management strategy o Collaborate with PCP staff to send in refill for cyclobenzaprine o Recommend patient receive Shingrix vaccine o Recommend DEXA scan and Vitamin D level . Patient self care activities - Over  the next 90 days, patient will: o Focus on medication adherence by considering using a pill box o Take medications as prescribed o Report any questions or concerns to PharmD and/or provider(s)  Initial goal documentation        Kathryn Walsh was given information about Chronic Care Management services today including:  1. CCM service  includes personalized support from designated clinical staff supervised by her physician, including individualized plan of care and coordination with other care providers 2. 24/7 contact phone numbers for assistance for urgent and routine care needs. 3. Standard insurance, coinsurance, copays and deductibles apply for chronic care management only during months in which we provide at least 20 minutes of these services. Most insurances cover these services at 100%, however patients may be responsible for any copay, coinsurance and/or deductible if applicable. This service may help you avoid the need for more expensive face-to-face services. 4. Only one practitioner may furnish and bill the service in a calendar month. 5. The patient may stop CCM services at any time (effective at the end of the month) by phone call to the office staff.  Patient agreed to services and verbal consent obtained.   The patient verbalized understanding of instructions provided today and agreed to receive a mailed copy of patient instruction and/or educational materials. Telephone follow up appointment with pharmacy team member scheduled for:09/16/20 @ 12:30 PM  Jannette Fogo, PharmD Clinical Pharmacist [facility] [phone]   Diabetes Mellitus and Nutrition, Adult When you have diabetes (diabetes mellitus), it is very important to have healthy eating habits because your blood sugar (glucose) levels are greatly affected by what you eat and drink. Eating healthy foods in the appropriate amounts, at about the same times every day, can help you:  Control your blood glucose.  Lower your risk of heart disease.  Improve your blood pressure.  Reach or maintain a healthy weight. Every person with diabetes is different, and each person has different needs for a meal plan. Your health care provider may recommend that you work with a diet and nutrition specialist (dietitian) to make a meal plan  that is best for you. Your meal plan may vary depending on factors such as:  The calories you need.  The medicines you take.  Your weight.  Your blood glucose, blood pressure, and cholesterol levels.  Your activity level.  Other health conditions you have, such as heart or kidney disease. How do carbohydrates affect me? Carbohydrates, also called carbs, affect your blood glucose level more than any other type of food. Eating carbs naturally raises the amount of glucose in your blood. Carb counting is a method for keeping track of how many carbs you eat. Counting carbs is important to keep your blood glucose at a healthy level, especially if you use insulin or take certain oral diabetes medicines. It is important to know how many carbs you can safely have in each meal. This is different for every person. Your dietitian can help you calculate how many carbs you should have at each meal and for each snack. Foods that contain carbs include:  Bread, cereal, rice, pasta, and crackers.  Potatoes and corn.  Peas, beans, and lentils.  Milk and yogurt.  Fruit and juice.  Desserts, such as cakes, cookies, ice cream, and candy. How does alcohol affect me? Alcohol can cause a sudden decrease in blood glucose (hypoglycemia), especially if you use insulin or take certain oral diabetes medicines. Hypoglycemia can be a life-threatening condition. Symptoms of  hypoglycemia (sleepiness, dizziness, and confusion) are similar to symptoms of having too much alcohol. If your health care provider says that alcohol is safe for you, follow these guidelines:  Limit alcohol intake to no more than 1 drink per day for nonpregnant women and 2 drinks per day for men. One drink equals 12 oz of beer, 5 oz of wine, or 1 oz of hard liquor.  Do not drink on an empty stomach.  Keep yourself hydrated with water, diet soda, or unsweetened iced tea.  Keep in mind that regular soda, juice, and other mixers may contain  a lot of sugar and must be counted as carbs. What are tips for following this plan?  Reading food labels  Start by checking the serving size on the "Nutrition Facts" label of packaged foods and drinks. The amount of calories, carbs, fats, and other nutrients listed on the label is based on one serving of the item. Many items contain more than one serving per package.  Check the total grams (g) of carbs in one serving. You can calculate the number of servings of carbs in one serving by dividing the total carbs by 15. For example, if a food has 30 g of total carbs, it would be equal to 2 servings of carbs.  Check the number of grams (g) of saturated and trans fats in one serving. Choose foods that have low or no amount of these fats.  Check the number of milligrams (mg) of salt (sodium) in one serving. Most people should limit total sodium intake to less than 2,300 mg per day.  Always check the nutrition information of foods labeled as "low-fat" or "nonfat". These foods may be higher in added sugar or refined carbs and should be avoided.  Talk to your dietitian to identify your daily goals for nutrients listed on the label. Shopping  Avoid buying canned, premade, or processed foods. These foods tend to be high in fat, sodium, and added sugar.  Shop around the outside edge of the grocery store. This includes fresh fruits and vegetables, bulk grains, fresh meats, and fresh dairy. Cooking  Use low-heat cooking methods, such as baking, instead of high-heat cooking methods like deep frying.  Cook using healthy oils, such as olive, canola, or sunflower oil.  Avoid cooking with butter, cream, or high-fat meats. Meal planning  Eat meals and snacks regularly, preferably at the same times every day. Avoid going long periods of time without eating.  Eat foods high in fiber, such as fresh fruits, vegetables, beans, and whole grains. Talk to your dietitian about how many servings of carbs you can  eat at each meal.  Eat 4-6 ounces (oz) of lean protein each day, such as lean meat, chicken, fish, eggs, or tofu. One oz of lean protein is equal to: ? 1 oz of meat, chicken, or fish. ? 1 egg. ?  cup of tofu.  Eat some foods each day that contain healthy fats, such as avocado, nuts, seeds, and fish. Lifestyle  Check your blood glucose regularly.  Exercise regularly as told by your health care provider. This may include: ? 150 minutes of moderate-intensity or vigorous-intensity exercise each week. This could be brisk walking, biking, or water aerobics. ? Stretching and doing strength exercises, such as yoga or weightlifting, at least 2 times a week.  Take medicines as told by your health care provider.  Do not use any products that contain nicotine or tobacco, such as cigarettes and e-cigarettes. If you need help  quitting, ask your health care provider.  Work with a Social worker or diabetes educator to identify strategies to manage stress and any emotional and social challenges. Questions to ask a health care provider  Do I need to meet with a diabetes educator?  Do I need to meet with a dietitian?  What number can I call if I have questions?  When are the best times to check my blood glucose? Where to find more information:  American Diabetes Association: diabetes.org  Academy of Nutrition and Dietetics: www.eatright.[employer] of Diabetes and Digestive and Kidney Diseases (NIH): [employer] Summary  A healthy meal plan will help you control your blood glucose and maintain a healthy lifestyle.  Working with a diet and nutrition specialist (dietitian) can help you make a meal plan that is best for you.  Keep in mind that carbohydrates (carbs) and alcohol have immediate effects on your blood glucose levels. It is important to count carbs and to use alcohol carefully. This information is not intended to replace advice given to you by your health care  provider. Make sure you discuss any questions you have with your health care provider. Document Revised: 09/27/2017 Document Reviewed: 11/19/2016 Elsevier Patient Education  [place].  High Triglycerides Eating Plan Triglycerides are a type of fat in the blood. High levels of triglycerides can increase your risk of heart disease and stroke. If your triglyceride levels are high, choosing the right foods can help lower your triglycerides and keep your heart healthy. Work with your health care provider or a diet and nutrition specialist (dietitian) to develop an eating plan that is right for you. What are tips for following this plan? General guidelines   Lose weight, if you are overweight. For most people, losing 5-10 lbs (2-5 kg) helps lower triglyceride levels. A weight-loss plan may include. ? 30 minutes of exercise at least 5 days a week. ? Reducing the amount of calories, sugar, and fat you eat.  Eat a wide variety of fresh fruits, vegetables, and whole grains. These foods are high in fiber.  Eat foods that contain healthy fats, such as fatty fish, nuts, seeds, and olive oil.  Avoid foods that are high in added sugar, added salt (sodium), saturated fat, and trans fat.  Avoid low-fiber, refined carbohydrates such as white bread, crackers, noodles, and white rice.  Avoid foods with partially hydrogenated oils (trans fats), such as fried foods or stick margarine.  Limit alcohol intake to no more than 1 drink a day for nonpregnant women and 2 drinks a day for men. One drink equals 12 oz of beer, 5 oz of wine, or 1 oz of hard liquor. Your health care provider may recommend that you drink less depending on your overall health. Reading food labels  Check food labels for the amount of saturated fat. Choose foods with no or very little saturated fat.  Check food labels for the amount of trans fat. Choose foods with no trans fat.  Check food labels for the amount of cholesterol.  Choose foods low in cholesterol. Ask your dietitian how much cholesterol you should have each day.  Check food labels for the amount of sodium. Choose foods with less than 140 milligrams (mg) per serving. Shopping  Buy dairy products labeled as nonfat (skim) or low-fat (1%).  Avoid buying processed or prepackaged foods. These are often high in added sugar, sodium, and fat. Cooking  Choose healthy fats when cooking, such as olive oil or canola oil.  Cook foods using lower fat methods, such as baking, broiling, boiling, or grilling.  Make your own sauces, dressings, and marinades when possible, instead of buying them. Store-bought sauces, dressings, and marinades are often high in sodium and sugar. Meal planning  Eat more home-cooked food and less restaurant, buffet, and fast food.  Eat fatty fish at least 2 times each week. Examples of fatty fish include salmon, trout, mackerel, tuna, and herring.  If you eat whole eggs, do not eat more than 3 egg yolks per week. What foods are recommended? The items listed may not be a complete list. Talk with your dietitian about what dietary choices are best for you. Grains Whole wheat or whole grain breads, crackers, cereals, and pasta. Unsweetened oatmeal. Bulgur. Barley. Quinoa. Brown rice. Whole wheat flour tortillas. Vegetables Fresh or frozen vegetables. Low-sodium canned vegetables. Fruits All fresh, canned (in natural juice), or frozen fruits. Meats and other protein foods Skinless chicken or [place]. Ground chicken or [place]. Lean cuts of pork, trimmed of fat. Fish and seafood, especially salmon, trout, and herring. Egg whites. Dried beans, peas, or lentils. Unsalted nuts or seeds. Unsalted canned beans. Natural peanut or almond butter. Dairy Low-fat dairy products. Skim or low-fat (1%) milk. Reduced fat (2%) and low-sodium cheese. Low-fat ricotta cheese. Low-fat cottage cheese. Plain, low-fat yogurt. Fats and oils Tub margarine without  trans fats. Light or reduced-fat mayonnaise. Light or reduced-fat salad dressings. Avocado. Safflower, olive, sunflower, soybean, and canola oils. What foods are not recommended? The items listed may not be a complete list. Talk with your dietitian about what dietary choices are best for you. Grains White bread. White (regular) pasta. White rice. Cornbread. Bagels. Pastries. Crackers that contain trans fat. Vegetables Creamed or fried vegetables. Vegetables in a cheese sauce. Fruits Sweetened dried fruit. Canned fruit in syrup. Fruit juice. Meats and other protein foods Fatty cuts of meat. Ribs. Chicken wings. Berniece Salines. Sausage. Bologna. Salami. Chitterlings. Fatback. Hot dogs. Bratwurst. Packaged lunch meats. Dairy Whole or reduced-fat (2%) milk. Half-and-half. Cream cheese. Full-fat or sweetened yogurt. Full-fat cheese. Nondairy creamers. Whipped toppings. Processed cheese or cheese spreads. Cheese curds. Beverages Alcohol. Sweetened drinks, such as soda, lemonade, fruit drinks, or punches. Fats and oils Butter. Stick margarine. Lard. Shortening. Ghee. Bacon fat. Tropical oils, such as coconut, palm kernel, or palm oils. Sweets and desserts Corn syrup. Sugars. Honey. Molasses. Candy. Jam and jelly. Syrup. Sweetened cereals. Cookies. Pies. Cakes. Donuts. Muffins. Ice cream. Condiments Store-bought sauces, dressings, and marinades that are high in sugar, such as ketchup and barbecue sauce. Summary  High levels of triglycerides can increase the risk of heart disease and stroke. Choosing the right foods can help lower your triglycerides.  Eat plenty of fresh fruits, vegetables, and whole grains. Choose low-fat dairy and lean meats. Eat fatty fish at least twice a week.  Avoid processed and prepackaged foods with added sugar, sodium, saturated fat, and trans fat.  If you need suggestions or have questions about what types of food are good for you, talk with your health care provider or a  dietitian. This information is not intended to replace advice given to you by your health care provider. Make sure you discuss any questions you have with your health care provider. Document Revised: 09/27/2017 Document Reviewed: 12/18/2016 Elsevier Patient Education  2020 [employer].

## 2020-08-17 ENCOUNTER — Telehealth: Payer: Medicare Other

## 2020-08-17 ENCOUNTER — Telehealth: Payer: Self-pay

## 2020-08-17 NOTE — Telephone Encounter (Signed)
  Chronic Care Management   Outreach Note  08/17/2020 Name: Kathryn Walsh MRN: [id number] DOB: 1956/01/27  Referred by: Glendale Chard, MD Reason for referral : Care Coordination   An unsuccessful telephone outreach was attempted today. The patient was referred to the case management team for assistance with care management and care coordination.   Follow Up Plan: A HIPAA compliant phone message was left for the patient providing contact information and requesting a return call.  The care management team will reach out to the patient again over the next 21 days.   Daneen Schick, BSW, CDP Social Worker, Certified Dementia Practitioner [place] [facility] [phone]

## 2020-08-25 ENCOUNTER — Ambulatory Visit (INDEPENDENT_AMBULATORY_CARE_PROVIDER_SITE_OTHER): Payer: Medicare Other

## 2020-08-25 ENCOUNTER — Other Ambulatory Visit: Payer: Self-pay

## 2020-08-25 VITALS — BP 150/80 | HR 81 | Temp 98.6°F | Ht 67.2 in | Wt 171.2 lb

## 2020-08-25 DIAGNOSIS — Z23 Encounter for immunization: Secondary | ICD-10-CM

## 2020-08-25 DIAGNOSIS — Z0000 Encounter for general adult medical examination without abnormal findings: Secondary | ICD-10-CM | POA: Diagnosis not present

## 2020-08-25 MED ORDER — SHINGRIX 50 MCG/0.5ML IM SUSR: 0.5000 mL | Freq: Once | INTRAMUSCULAR | 0 refills | Status: AC

## 2020-08-25 NOTE — Patient Instructions (Signed)
Kathryn Walsh , Thank you for taking time to come for your Medicare Wellness Visit. I appreciate your ongoing commitment to your health goals. Please review the following plan we discussed and let me know if I can assist you in the future.   Screening recommendations/referrals: Colonoscopy: cologuard 12/08/2018 Mammogram: completed 12/27/2017 Bone Density: n/a Recommended yearly ophthalmology/optometry visit for glaucoma screening and checkup Recommended yearly dental visit for hygiene and checkup  Vaccinations: Influenza vaccine: completed 07/26/2020, due 05/29/2021 Pneumococcal vaccine: completed 12/16/2018 Tdap vaccine: completed 06/10/2012 Shingles vaccine: sent to pharmacy  Covid-19:  07/26/2020, 12/01/2019, 11/03/2019  Advanced directives: Please bring a copy of your POA (Power of Attorney) and/or Living Will to your next appointment.   Conditions/risks identified: smoking  Next appointment: 04/20/2021 at 9:30 Follow up in one year for your annual wellness visit.   Preventive Care 40-64 Years, Female Preventive care refers to lifestyle choices and visits with your health care provider that can promote health and wellness. What does preventive care include?  A yearly physical exam. This is also called an annual well check.  Dental exams once or twice a year.  Routine eye exams. Ask your health care provider how often you should have your eyes checked.  Personal lifestyle choices, including:  Daily care of your teeth and gums.  Regular physical activity.  Eating a healthy diet.  Avoiding tobacco and drug use.  Limiting alcohol use.  Practicing safe sex.  Taking low-dose aspirin daily starting at age 67.  Taking vitamin and mineral supplements as recommended by your health care provider. What happens during an annual well check? The services and screenings done by your health care provider during your annual well check will depend on your age, overall health, lifestyle risk  factors, and family history of disease. Counseling  Your health care provider may ask you questions about your:  Alcohol use.  Tobacco use.  Drug use.  Emotional well-being.  Home and relationship well-being.  Sexual activity.  Eating habits.  Work and work Statistician.  Method of birth control.  Menstrual cycle.  Pregnancy history. Screening  You may have the following tests or measurements:  Height, weight, and BMI.  Blood pressure.  Lipid and cholesterol levels. These may be checked every 5 years, or more frequently if you are over 73 years old.  Skin check.  Lung cancer screening. You may have this screening every year starting at age 85 if you have a 30-pack-year history of smoking and currently smoke or have quit within the past 15 years.  Fecal occult blood test (FOBT) of the stool. You may have this test every year starting at age 17.  Flexible sigmoidoscopy or colonoscopy. You may have a sigmoidoscopy every 5 years or a colonoscopy every 10 years starting at age 67.  Hepatitis C blood test.  Hepatitis B blood test.  Sexually transmitted disease (STD) testing.  Diabetes screening. This is done by checking your blood sugar (glucose) after you have not eaten for a while (fasting). You may have this done every 1-3 years.  Mammogram. This may be done every 1-2 years. Talk to your health care provider about when you should start having regular mammograms. This may depend on whether you have a family history of breast cancer.  BRCA-related cancer screening. This may be done if you have a family history of breast, ovarian, tubal, or peritoneal cancers.  Pelvic exam and Pap test. This may be done every 3 years starting at age 38. Starting at age 2,  this may be done every 5 years if you have a Pap test in combination with an HPV test.  Bone density scan. This is done to screen for osteoporosis. You may have this scan if you are at high risk for  osteoporosis. Discuss your test results, treatment options, and if necessary, the need for more tests with your health care provider. Vaccines  Your health care provider may recommend certain vaccines, such as:  Influenza vaccine. This is recommended every year.  Tetanus, diphtheria, and acellular pertussis (Tdap, Td) vaccine. You may need a Td booster every 10 years.  Zoster vaccine. You may need this after age 66.  Pneumococcal 13-valent conjugate (PCV13) vaccine. You may need this if you have certain conditions and were not previously vaccinated.  Pneumococcal polysaccharide (PPSV23) vaccine. You may need one or two doses if you smoke cigarettes or if you have certain conditions. Talk to your health care provider about which screenings and vaccines you need and how often you need them. This information is not intended to replace advice given to you by your health care provider. Make sure you discuss any questions you have with your health care provider. Document Released: 11/11/2015 Document Revised: 07/04/2016 Document Reviewed: 08/16/2015 Elsevier Interactive Patient Education  2017 [place] Prevention in the Home Falls can cause injuries. They can happen to people of all ages. There are many things you can do to make your home safe and to help prevent falls. What can I do on the outside of my home?  Regularly fix the edges of walkways and driveways and fix any cracks.  Remove anything that might make you trip as you walk through a door, such as a raised step or threshold.  Trim any bushes or trees on the path to your home.  Use bright outdoor lighting.  Clear any walking paths of anything that might make someone trip, such as rocks or tools.  Regularly check to see if handrails are loose or broken. Make sure that both sides of any steps have handrails.  Any raised decks and porches should have guardrails on the edges.  Have any leaves, snow, or ice cleared  regularly.  Use sand or salt on walking paths during winter.  Clean up any spills in your garage right away. This includes oil or grease spills. What can I do in the bathroom?  Use night lights.  Install grab bars by the toilet and in the tub and shower. Do not use towel bars as grab bars.  Use non-skid mats or decals in the tub or shower.  If you need to sit down in the shower, use a plastic, non-slip stool.  Keep the floor dry. Clean up any water that spills on the floor as soon as it happens.  Remove soap buildup in the tub or shower regularly.  Attach bath mats securely with double-sided non-slip rug tape.  Do not have throw rugs and other things on the floor that can make you trip. What can I do in the bedroom?  Use night lights.  Make sure that you have a light by your bed that is easy to reach.  Do not use any sheets or blankets that are too big for your bed. They should not hang down onto the floor.  Have a firm chair that has side arms. You can use this for support while you get dressed.  Do not have throw rugs and other things on the floor that can make  you trip. What can I do in the kitchen?  Clean up any spills right away.  Avoid walking on wet floors.  Keep items that you use a lot in easy-to-reach places.  If you need to reach something above you, use a strong step stool that has a grab bar.  Keep electrical cords out of the way.  Do not use floor polish or wax that makes floors slippery. If you must use wax, use non-skid floor wax.  Do not have throw rugs and other things on the floor that can make you trip. What can I do with my stairs?  Do not leave any items on the stairs.  Make sure that there are handrails on both sides of the stairs and use them. Fix handrails that are broken or loose. Make sure that handrails are as long as the stairways.  Check any carpeting to make sure that it is firmly attached to the stairs. Fix any carpet that is loose  or worn.  Avoid having throw rugs at the top or bottom of the stairs. If you do have throw rugs, attach them to the floor with carpet tape.  Make sure that you have a light switch at the top of the stairs and the bottom of the stairs. If you do not have them, ask someone to add them for you. What else can I do to help prevent falls?  Wear shoes that:  Do not have high heels.  Have rubber bottoms.  Are comfortable and fit you well.  Are closed at the toe. Do not wear sandals.  If you use a stepladder:  Make sure that it is fully opened. Do not climb a closed stepladder.  Make sure that both sides of the stepladder are locked into place.  Ask someone to hold it for you, if possible.  Clearly mark and make sure that you can see:  Any grab bars or handrails.  First and last steps.  Where the edge of each step is.  Use tools that help you move around (mobility aids) if they are needed. These include:  Canes.  Walkers.  Scooters.  Crutches.  Turn on the lights when you go into a dark area. Replace any light bulbs as soon as they burn out.  Set up your furniture so you have a clear path. Avoid moving your furniture around.  If any of your floors are uneven, fix them.  If there are any pets around you, be aware of where they are.  Review your medicines with your doctor. Some medicines can make you feel dizzy. This can increase your chance of falling. Ask your doctor what other things that you can do to help prevent falls. This information is not intended to replace advice given to you by your health care provider. Make sure you discuss any questions you have with your health care provider. Document Released: 08/11/2009 Document Revised: 03/22/2016 Document Reviewed: 11/19/2014 Elsevier Interactive Patient Education  2017 [employer].

## 2020-08-25 NOTE — Progress Notes (Signed)
This visit occurred during the SARS-CoV-2 public health emergency.  Safety protocols were in place, including screening questions prior to the visit, additional usage of staff PPE, and extensive cleaning of exam room while observing appropriate contact time as indicated for disinfecting solutions.  Subjective:   Kathryn Walsh is a 64 y.o. female who presents for Medicare Annual (Subsequent) preventive examination.  Review of Systems     Cardiac Risk Factors include: advanced age (>52mn, >>72women);diabetes mellitus;dyslipidemia;hypertension;sedentary lifestyle     Objective:    Today's Vitals   08/25/20 1527  BP: (!) 150/80  Pulse: 81  Temp: 98.6 F (37 C)  TempSrc: Oral  SpO2: 97%  Weight: 171 lb 3.2 oz (77.7 kg)  Height: 5' 7.2" (1.707 m)   Body mass index is 26.65 kg/m.  Advanced Directives 08/25/2020 07/02/2019 08/22/2017 01/31/2017 10/09/2014 07/30/2013 07/11/2013  Does Patient Have a Medical Advance Directive? Yes Yes Yes No No Patient has advance directive, copy not in chart Patient has advance directive, copy not in chart  Type of Advance Directive Living will [place] - - Living will Living will  Copy of [place] Chart? - No - copy requested - - - Copy requested from family Copy requested from family  Would patient like information on creating a medical advance directive? - - - No - Patient declined No - patient declined information - -  Pre-existing out of facility DNR order (yellow form or pink MOST form) - - - - - No No    Current Medications (verified) Outpatient Encounter Medications as of 08/25/2020  Medication Sig  . amLODipine (NORVASC) 5 MG tablet TAKE 1 TABLET BY MOUTH EVERY DAY  . aspirin 81 MG chewable tablet Chew 81 mg by mouth daily.  .Marland KitchenazaTHIOprine (IMURAN) 50 MG tablet Take 100 mg by mouth 2 (two) times daily.   . Blood Glucose Monitoring Suppl (ACCU-CHEK  GUIDE ME) w/Device KIT Use to check blood sugar 3 times a day. Dx code e11.65  . Cholecalciferol (VITAMIN D HIGH POTENCY) 25 MCG (1000 UT) capsule Take 4,000 Units by mouth daily.  .Marland Kitchenglucose blood (ACCU-CHEK GUIDE) test strip Use as instructed to check blood sugar 3 times a day. Dx code e11.65  . insulin degludec (TRESIBA FLEXTOUCH) 200 UNIT/ML FlexTouch Pen Inject 42 Units into the skin. Pt doing 32 units qhs  . Insulin Lispro (HUMALOG KWIKPEN) 200 UNIT/ML SOPN Inject 6 Units into the skin 2 (two) times daily before a meal. Please inject 6 units before breakfast and dinner; max dose titration 40 units daily  . pravastatin (PRAVACHOL) 40 MG tablet TAKE 1 TABLET BY MOUTH EVERYDAY AT BEDTIME  . propranolol (INDERAL) 20 MG tablet Take 20 mg by mouth 2 (two) times daily. Once daily  . tacrolimus (PROGRAF) 1 MG capsule Take 2 mg in am and 3 mg in pm  . Turmeric 500 MG TABS Take by mouth.  . Zoster Vaccine Adjuvanted ([facility] injection Inject 0.5 mLs into the muscle once for 1 dose.   No facility-administered encounter medications on file as of 08/25/2020.    Allergies (verified) Patient has no known allergies.   History: Past Medical History:  Diagnosis Date  . Diabetes mellitus without complication ([place]   . High cholesterol   . HTN (hypertension)   . Liver transplant recipient ([facility]    Past Surgical History:  Procedure Laterality Date  . LIVER TRANSPLANTATION     Family History  Problem  Relation Age of Onset  . Healthy Mother   . Healthy Father    Social History   Socioeconomic History  . Marital status: Single    Spouse name: Not on file  . Number of children: Not on file  . Years of education: Not on file  . Highest education level: Not on file  Occupational History  . Occupation: employed  Tobacco Use  . Smoking status: Current Some Day Smoker    Packs/day: 0.25    Years: 12.00    Pack years: 3.00    Types: Cigarettes  . Smokeless tobacco: Never Used  . Tobacco  comment: encouraged to avoid smoking in the house and in her car  Vaping Use  . Vaping Use: Never used  Substance and Sexual Activity  . Alcohol use: No  . Drug use: No  . Sexual activity: Not Currently  Other Topics Concern  . Not on file  Social History Narrative  . Not on file   Social Determinants of Health   Financial Resource Strain: Low Risk   . Difficulty of Paying Living Expenses: Not hard at all  Food Insecurity: No Food Insecurity  . Worried About Charity fundraiser in the Last Year: Never true  . Ran Out of Food in the Last Year: Never true  Transportation Needs: No Transportation Needs  . Lack of Transportation (Medical): No  . Lack of Transportation (Non-Medical): No  Physical Activity: Inactive  . Days of Exercise per Week: 0 days  . Minutes of Exercise per Session: 0 min  Stress: No Stress Concern Present  . Feeling of Stress : Not at all  Social Connections:   . Frequency of Communication with Friends and Family: Not on file  . Frequency of Social Gatherings with Friends and Family: Not on file  . Attends Religious Services: Not on file  . Active Member of Clubs or Organizations: Not on file  . Attends Archivist Meetings: Not on file  . Marital Status: Not on file    Tobacco Counseling Ready to quit: No Counseling given: Not Answered Comment: encouraged to avoid smoking in the house and in her car   Clinical Intake:  Pre-visit preparation completed: Yes  Pain : No/denies pain     Nutritional Status: BMI 25 -29 Overweight Nutritional Risks: None Diabetes: Yes  How often do you need to have someone help you when you read instructions, pamphlets, or other written materials from your doctor or pharmacy?: 1 - Never What is the last grade level you completed in school?: 81yr college  Diabetic? Yes Nutrition Risk Assessment:  Has the patient had any N/V/D within the last 2 months?  No  Does the patient have any non-healing wounds?  No   Has the patient had any unintentional weight loss or weight gain?  No   Diabetes:  Is the patient diabetic?  Yes  If diabetic, was a CBG obtained today?  No  Did the patient bring in their glucometer from home?  No  How often do you monitor your CBG's? Twice daily.   Financial Strains and Diabetes Management:  Are you having any financial strains with the device, your supplies or your medication? No .  Does the patient want to be seen by Chronic Care Management for management of their diabetes?  No  Would the patient like to be referred to a Nutritionist or for Diabetic Management?  No   Diabetic Exams:  Diabetic Eye Exam: Completed 05/16/2020 Diabetic Foot  Exam: Completed 04/12/2020   Interpreter Needed?: No  Information entered by :: NAllen LPN   Activities of Daily Living In your present state of health, do you have any difficulty performing the following activities: 08/25/2020  Hearing? N  Vision? N  Difficulty concentrating or making decisions? N  Walking or climbing stairs? N  Dressing or bathing? N  Doing errands, shopping? N  Preparing Food and eating ? N  Using the Toilet? N  In the past six months, have you accidently leaked urine? N  Do you have problems with loss of bowel control? N  Managing your Medications? N  Managing your Finances? N  Housekeeping or managing your Housekeeping? N  Some recent data might be hidden    Patient Care Team: Glendale Chard, MD as PCP - General (Internal Medicine) Rex Kras, Claudette Stapler, RN as Case Manager Daneen Schick as Social Worker [place], [place], [place] (Inactive) (Pharmacist)  Indicate any recent Medical Services you may have received from other than [facility] providers in the past year (date may be approximate).     Assessment:   This is a routine wellness examination for Aishia.  Hearing/Vision screen  Hearing Screening   '125Hz'  '250Hz'  '500Hz'  '1000Hz'  '2000Hz'  '3000Hz'  '4000Hz'  '6000Hz'  '8000Hz'   Right ear:           Left ear:            Vision Screening Comments: Regular eye exams, Dr, [facility]  Dietary issues and exercise activities discussed: Current Exercise Habits: The patient has a physically strenuous job, but has no regular exercise apart from work.  Goals    .  "I would like to lower my A1C" (pt-stated)      CARE PLAN ENTRY (see longtitudinal plan of care for additional care plan information)  Current Barriers:  [name] Knowledge Deficits related to disease process and Self Health management of DM . Chronic Disease Management support and education needs related to DMII, HTN  Nurse Case Manager Clinical Goal(s):  [name] Over the next 90 days, patient will work with the CCM team and PCP to address needs related to disease education and support to improve Self Health management of DM AEB patient will lower her A1C <12.0 . Over the next 60 days, patient will complete a new patient appointment with Endocrinology for management of DM - Goal Not Met referral not placed by PCP  CCM RN CM Interventions:  05/31/20 spoke to patient  . Evaluation of current treatment plan related to Diabetes and patient's adherence to plan as established by provider. . Provided education to patient re: recent A1C is down to 6.9 % a decrease from 12.0 % 5 months ago; Positive reinforcement given to patient for making lifestyle changes and adhering to her DM treatment plan; re-educated to patient with rationale regarding target A1C of <7.0; Re-educated to patient ways to maintain this goal through ongoing medication adherence, following a diabetic friendly diet and implementing exercise into daily routine  . Reviewed medications with patient and discussed the indication, dosage and frequency of patient's prescribed medication regimen; patient reports adherence with no missed doses . Provided patient with printed educational materials related to Diabetes Care Guide; Grocery Shopping with Diabetes; Prevent Complications from Diabetes . Advised  patient, providing education and rationale, to check cbg daily before breakfast and record, calling the CCM team and or PCP for findings outside established parameters; Reviewed daily glycemic range FBS 80-130, <180 after meals  . Discussed plans with patient for ongoing care management follow up  and provided patient with direct contact information for care management team    Patient Self Care Activities:  . Patient verbalizes understanding of plan to notify the CCM team and or PCP for reoccurring hypoglycemic events . Self administers medications as prescribed . Attends all scheduled provider appointments . Calls pharmacy for medication refills . Performs ADL's independently . Performs IADL's independently . Calls provider office for new concerns or questions   Initial goal documentation and Please see past updates related to this goal by clicking on the "Past Updates" button in the selected goal      .  "to lower my Triglycerides" (pt-stated)      CARE PLAN ENTRY (see longitudinal plan of care for additional care plan information)  Current Barriers:  [name] Knowledge Deficits related to disease process and Self Health management of elevated Triglycerides . Chronic Disease Management support and education needs related to DMT2, HTN  Nurse Case Manager Clinical Goal(s):  [name] Over the next 90 days, patient will work with the [place] CM and PCP to address needs related to disease education and support to improve Self Health management of Hypertriglyceridemia  CCM RN CM Interventions:  05/31/20 call completed with patient  . Inter-disciplinary care team collaboration (see longitudinal plan of care) . Evaluation of current treatment plan related to Hypertriglyceridemia and patient's adherence to plan as established by provider. . Provided education to patient re: recent Triglyceride is elevated to 202; educated on target range  . Discussed plans with patient for ongoing care management follow up  and provided patient with direct contact information for care management team . Provided patient with printed  educational materials related to Triglycerides: Why do they matter?; 13 Cholesterol Lowering Foods   Patient Self Care Activities:  . Self administers medications as prescribed . Attends all scheduled provider appointments . Calls pharmacy for medication refills . Calls provider office for new concerns or questions  Initial goal documentation     .  Patient Stated      08/25/2020, one day to stop smoking    .  Pharmacy Care Plan      CARE PLAN ENTRY (see longitudinal plan of care for additional care plan information)  Current Barriers:  . Chronic Disease Management support, education, and care coordination needs related to Hypertension, Hyperlipidemia, and Diabetes   Hypertension BP Readings from Last 3 Encounters:  04/12/20 (!) 144/76  12/21/19 126/70  07/02/19 138/70   . Pharmacist Clinical Goal(s): o Over the next 90 days, patient will work with PharmD and providers to maintain BP goal <130/80 . Current regimen:  o Amlodipine 69m daily o Propanolol 273mtwice daily . Interventions: o Recommend patient check BP at home occasionally and if symptomatic o Determined patient was only taking propranolol once daily - Advised patient to start taking propranolol twice daily as directed . Patient self care activities - Over the next 90 days, patient will: o Check BP weekly and if symptomatic, document, and provide at future appointments o Ensure daily salt intake < 2300 mg/day o Take propranolol 2011mwice daily as directed o Increase exercise as able with a goal of 30 minutes daily 5 times per week (150 minutes per week)  Hyperlipidemia Lab Results  Component Value Date/Time   LDLCALC 69 04/12/2020 09:52 AM   . Pharmacist Clinical Goal(s): o Over the next 180 days, patient will work with PharmD and providers to maintain LDL goal < 70 . Current regimen:   o Pravastatin 70m16mily at bedtime .  Interventions: o Discussed appropriate goals for LDL (less than 70), HDL (greater than 50) and triglycerides (less than 150) o Provided dietary and exercise recommendations . Patient self care activities - Over the next 180 days, patient will: o Limit fried foods and fatty foods o Increase exercise as able with a goal of 30 minutes daily 5 times per week (150 minutes per week)  Diabetes Lab Results  Component Value Date/Time   HGBA1C 6.9 (H) 04/12/2020 09:52 AM   HGBA1C 12.0 (H) 12/21/2019 04:43 PM   . Pharmacist Clinical Goal(s): o Over the next 90 days, patient will work with PharmD and providers to maintain A1c goal <7% . Current regimen:  o Tresiba 42 units at bedtime o Humalog Kwikpen 6 units twice daily before a meal . Interventions: o Provided dietary and exercise recommendations o Assist with reordering refill for Tresiba from patient assistance program o Recommend patient check blood sugar if she wakes up in the middle of the night sweating o Collaborate with PCP staff to send in refill for Accu Check Guide Me test strips for blood glucose meter Discussed hypoglycemia treatment (juice, hard candy, etc) . Patient self care activities - Over the next 90 days, patient will: o Check blood sugar twice daily, document, and provide at future appointments o Contact provider with any episodes of hypoglycemia o Increase exercise as able with a goal of 30 minutes daily 5 times per week (150 minutes per week) o Focus on well-balanced meal utilizing the PLATE method  Medication management . Pharmacist Clinical Goal(s): o Over the next 90 days, patient will work with PharmD and providers to maintain optimal medication adherence . Current pharmacy: [facility] . Interventions o Comprehensive medication review performed. o Continue current medication management strategy o Collaborate with PCP staff to send in refill for  cyclobenzaprine o Recommend patient receive Shingrix vaccine o Recommend DEXA scan and Vitamin D level . Patient self care activities - Over the next 90 days, patient will: o Focus on medication adherence by considering using a pill box o Take medications as prescribed o Report any questions or concerns to PharmD and/or provider(s)  Initial goal documentation     .  Quit Smoking      Depression Screen PHQ 2/9 Scores 08/25/2020 07/02/2019 04/07/2019 12/08/2018 10/06/2018  PHQ - 2 Score 0 0 0 0 0  PHQ- 9 Score - 1 - - -    Fall Risk Fall Risk  08/25/2020 07/02/2019 07/02/2019 04/07/2019 12/08/2018  Falls in the past year? 1 0 0 0 0  Comment tripped over bag - - - -  Number falls in past yr: 0 - - - -  Injury with Fall? 0 - - - -  Risk for fall due to : Medication side effect - Medication side effect - -  Follow up Falls evaluation completed;Education provided;Falls prevention discussed - Falls evaluation completed;Education provided;Falls prevention discussed - -    Any stairs in or around the home? Yes  If so, are there any without handrails? Yes  Home free of loose throw rugs in walkways, pet beds, electrical cords, etc? Yes  Adequate lighting in your home to reduce risk of falls? Yes   ASSISTIVE DEVICES UTILIZED TO PREVENT FALLS:  Life alert? No  Use of a cane, walker or w/c? No  Grab bars in the bathroom? No  Shower chair or bench in shower? Yes  Elevated toilet seat or a handicapped toilet? No   TIMED UP AND GO:  Was the  test performed? No .    Gait steady and fast without use of assistive device  Cognitive Function:     6CIT Screen 08/25/2020 07/02/2019  What Year? 0 points 0 points  What month? 0 points 0 points  What time? 0 points 0 points  Count back from 20 0 points 0 points  Months in reverse 0 points 0 points  Repeat phrase 0 points 0 points  Total Score 0 0    Immunizations Immunization History  Administered Date(s) Administered  . DTaP 06/10/2012  .  Influenza,inj,Quad PF,6+ Mos 10/06/2013, 08/12/2015, 08/01/2018, 07/02/2019  . Influenza-Unspecified 08/18/2012, 08/06/2018, 07/26/2020  . Moderna SARS-COVID-2 Vaccination 11/03/2019, 12/01/2019, 07/26/2020  . Pneumococcal Conjugate-13 08/12/2015  . Pneumococcal Polysaccharide-23 07/13/2013, 12/16/2018    TDAP status: Up to date Flu Vaccine status: Up to date Pneumococcal vaccine status: Up to date Covid-19 vaccine status: Completed vaccines  Qualifies for Shingles Vaccine? Yes   Zostavax completed No   Shingrix Completed?: No.    Education has been provided regarding the importance of this vaccine. Patient has been advised to call insurance company to determine out of pocket expense if they have not yet received this vaccine. Advised may also receive vaccine at local pharmacy or Health Dept. Verbalized acceptance and understanding.  Screening Tests Health Maintenance  Topic Date Due  . OPHTHALMOLOGY EXAM  Never done  . MAMMOGRAM  12/28/2019  . HIV Screening  04/12/2021 (Originally 11/21/1970)  . HEMOGLOBIN A1C  10/12/2020  . FOOT EXAM  04/12/2021  . URINE MICROALBUMIN  04/12/2021  . Fecal DNA (Cologuard)  12/08/2021  . TETANUS/TDAP  06/10/2022  . PAP SMEAR-Modifier  04/13/2023  . INFLUENZA VACCINE  Completed  . COVID-19 Vaccine  Completed  . Hepatitis C Screening  Completed    Health Maintenance  Health Maintenance Due  Topic Date Due  . OPHTHALMOLOGY EXAM  Never done  . MAMMOGRAM  12/28/2019    Colorectal cancer screening: Completed 12/08/2018. Repeat every 3 years Mammogram status: Ordered 02/23/2020. Pt provided with contact info and advised to call to schedule appt.  Bone Density status: not required  Lung Cancer Screening: (Low Dose CT Chest recommended if Age 73-80 years, 30 pack-year currently smoking OR have quit w/in 15years.) does not qualify.   Lung Cancer Screening Referral: no  Additional Screening:  Hepatitis C Screening: does qualify; Completed  07/11/2013   Vision Screening: Recommended annual ophthalmology exams for early detection of glaucoma and other disorders of the eye. Is the patient up to date with their annual eye exam?  Yes  Who is the provider or what is the name of the office in which the patient attends annual eye exams? Dr. [place] If pt is not established with a provider, would they like to be referred to a provider to establish care? No .   Dental Screening: Recommended annual dental exams for proper oral hygiene  Community Resource Referral / Chronic Care Management: CRR required this visit?  No   CCM required this visit?  No      Plan:     I have personally reviewed and noted the following in the patient's chart:   . Medical and social history . Use of alcohol, tobacco or illicit drugs  . Current medications and supplements . Functional ability and status . Nutritional status . Physical activity . Advanced directives . List of other physicians . Hospitalizations, surgeries, and ER visits in previous 12 months . Vitals . Screenings to include cognitive, depression, and falls . Referrals and  appointments  In addition, I have reviewed and discussed with patient certain preventive protocols, quality metrics, and best practice recommendations. A written personalized care plan for preventive services as well as general preventive health recommendations were provided to patient.     Kellie Simmering, LPN   16/10/930   Nurse Notes:

## 2020-09-06 ENCOUNTER — Ambulatory Visit: Payer: Medicare Other

## 2020-09-06 ENCOUNTER — Telehealth: Payer: Self-pay | Admitting: *Deleted

## 2020-09-06 DIAGNOSIS — E1165 Type 2 diabetes mellitus with hyperglycemia: Secondary | ICD-10-CM

## 2020-09-06 DIAGNOSIS — I10 Essential (primary) hypertension: Secondary | ICD-10-CM

## 2020-09-06 NOTE — Chronic Care Management (AMB) (Signed)
  Care Management   Note  09/06/2020 Name: [name] MRN: [id number] DOB: [birth year]  Kathryn Walsh is a 64 y.o. year old female who is a primary care patient of Glendale Chard, MD and is actively engaged with the care management team. I reached out to [employer] by phone today to assist with re-scheduling a follow up visit with the Pharmacist.  Follow up plan: Unsuccessful telephone outreach attempt made. A HIPAA compliant phone message was left for the patient providing contact information and requesting a return call. The care management team will reach out to the patient again over the next 7 days. If patient returns call to provider office, please advise to call [place] at [phone].  [place] Management  Direct Dial: [phone]

## 2020-09-06 NOTE — Chronic Care Management (AMB) (Signed)
Chronic Care Management    Social Work Follow Up Note  09/06/2020 Name: Kathryn Walsh MRN: [id number] DOB: 12-14-1955  Kathryn Walsh is a 64 y.o. year old female who is a primary care patient of Glendale Chard, MD. The CCM team was consulted for assistance with care coordination.   Review of patient status, including review of consultants reports, other relevant assessments, and collaboration with appropriate care team members and the patient's provider was performed as part of comprehensive patient evaluation and provision of chronic care management services.    SDOH (Social Determinants of Health) assessments performed: No    Outpatient Encounter Medications as of 09/06/2020  Medication Sig   amLODipine (NORVASC) 5 MG tablet TAKE 1 TABLET BY MOUTH EVERY DAY   aspirin 81 MG chewable tablet Chew 81 mg by mouth daily.   azaTHIOprine (IMURAN) 50 MG tablet Take 100 mg by mouth 2 (two) times daily.    Blood Glucose Monitoring Suppl (ACCU-CHEK GUIDE ME) w/Device KIT Use to check blood sugar 3 times a day. Dx code e11.65   Cholecalciferol (VITAMIN D HIGH POTENCY) 25 MCG (1000 UT) capsule Take 4,000 Units by mouth daily.   glucose blood (ACCU-CHEK GUIDE) test strip Use as instructed to check blood sugar 3 times a day. 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 Pt doing 32 units qhs   Insulin Lispro (HUMALOG KWIKPEN) 200 UNIT/ML SOPN Inject 6 Units into the skin 2 (two) times daily before a meal. Please inject 6 units before breakfast and dinner; max dose titration 40 units daily   pravastatin (PRAVACHOL) 40 MG tablet TAKE 1 TABLET BY MOUTH EVERYDAY AT BEDTIME   propranolol (INDERAL) 20 MG tablet Take 20 mg by mouth 2 (two) times daily. Once daily   tacrolimus (PROGRAF) 1 MG capsule Take 2 mg in am and 3 mg in pm   Turmeric 500 MG TABS Take by mouth.   No facility-administered encounter medications on file as  of 09/06/2020.     Goals Addressed            This Visit's Progress    Collaborate with RN Care Manager to perform appropriate assessments to assist with care coordination needs       CARE PLAN ENTRY (see longitudinal plan of care for additional care plan information)  Current Barriers:   Limited knowledge of how to fill prescription for glucometer strips  Chronic conditions including DM II and HTN which put patient at increased risk for hospitalization  Social Work Clinical Goal(s):   Over the next 10 days the patient will obtain glucometer strips from neighborhood pharmacy  Over the next 90 days the patient will work with SW to address care coordination needs  CCM SW Interventions: Completed 09/06/20  Inter-disciplinary care team collaboration (see longitudinal plan of care)  Successful outbound call placed to the patient to assist with care coordination needs  Determined there are no acute SDoH needs at this time  Discussed the patient is in need of testing strips for her glucometer  Performed chart review to note testing strip prescription was sent to [facility] on [address] (patients pharmacy of choice) on 9/30  Successful outbound call placed to [facility] with [facility] who confirms receipt of prescription o Mitzi Hansen reports he will fill prescription for the patient to pick up  Unsuccessful outbound call placed to the patient to inform the prescription will be available for pick up o Unable to  leave voice message o Scheduled follow up call over the next 4 days  Patient Self Care Activities:   Patient verbalizes understanding of plan to contact SW as needed prior to next scheduled call  Self administers medications as prescribed  Attends all scheduled provider appointments  Calls provider office for new concerns or questions  Initial goal documentation         Follow Up Plan: SW will follow up with patient by phone over the next 4 days  Daneen Schick, BSW, CDP Social Worker, Certified Dementia Practitioner [place] [facility] [place] [facility] [phone]  Total time spent performing care coordination and/or care management activities with the patient by phone or face to face = 10 minutes.

## 2020-09-06 NOTE — Patient Instructions (Signed)
Social Worker Visit Information  Goals we discussed today:  Goals Addressed            This Visit's Progress   . Collaborate with RN Care Manager to perform appropriate assessments to assist with care coordination needs       CARE PLAN ENTRY (see longitudinal plan of care for additional care plan information)  Current Barriers:  . Limited knowledge of how to fill prescription for glucometer strips . Chronic conditions including DM II and HTN which put patient at increased risk for hospitalization  Social Work Clinical Goal(s):  [name] Over the next 10 days the patient will obtain glucometer strips from neighborhood pharmacy . Over the next 90 days the patient will work with SW to address care coordination needs  CCM SW Interventions: Completed 09/06/20 . Inter-disciplinary care team collaboration (see longitudinal plan of care) . Successful outbound call placed to the patient to assist with care coordination needs . Determined there are no acute SDoH needs at this time . Discussed the patient is in need of testing strips for her glucometer . Performed chart review to note testing strip [facility] [address] (patients pharmacy of choice) on 9/30 . Successful outbound call placed to [facility] confirms receipt of prescription o Mitzi Hansen reports he will fill prescription for the patient to pick up . Unsuccessful outbound call placed to the patient to inform the prescription will be available for pick up o Unable to leave voice message o Scheduled follow up call over the next 4 days  Patient Self Care Activities:  . Patient verbalizes understanding of plan to contact SW as needed prior to next scheduled call . Self administers medications as prescribed . Attends all scheduled provider appointments . Calls provider office for new concerns or questions  Initial goal documentation         Follow Up Plan: SW will follow up with patient by  phone over the next 4 days.   Daneen Schick, BSW, CDP Social Worker, Certified Dementia Practitioner [place] [facility] [place] [facility] [phone]

## 2020-09-09 ENCOUNTER — Telehealth: Payer: Medicare Other

## 2020-09-09 ENCOUNTER — Telehealth: Payer: Self-pay

## 2020-09-09 NOTE — Telephone Encounter (Signed)
°  Chronic Care Management   Outreach Note  09/09/2020 Name: Prima Rayner MRN: [id number] DOB: Feb 02, 1956  Referred by: Glendale Chard, MD Reason for referral : Care Coordination   SW attempted second outreach call to inform patient DM test strips available for pick up at local pharmacy. Patient voice mailbox continues to be full.  Follow Up Plan: SW will follow up with the patient over the next 60 days.  Daneen Schick, BSW, CDP Social Worker, Certified Dementia Practitioner [place] [facility] [place] [facility] [phone]

## 2020-09-13 NOTE — Chronic Care Management (AMB) (Signed)
  Care Management   Note  09/13/2020 Name: Kathryn Walsh MRN: [id number] DOB: Jun 04, 1956  Kathryn Walsh is a 64 y.o. year old female who is a primary care patient of Glendale Chard, MD and is actively engaged with the care management team. I reached out to Roselyn Reef by phone today to assist with re-scheduling a follow up visit with the Pharmacist.  Follow up plan: Telephone appointment with care management team member scheduled for:10/18/2020  [place] [place] [facility]  Direct Dial: [phone]

## 2020-09-16 ENCOUNTER — Telehealth: Payer: Self-pay

## 2020-09-24 DIAGNOSIS — Z20822 Contact with and (suspected) exposure to covid-19: Secondary | ICD-10-CM | POA: Diagnosis not present

## 2020-10-12 ENCOUNTER — Encounter: Payer: Self-pay | Admitting: Nurse Practitioner

## 2020-10-12 ENCOUNTER — Ambulatory Visit (INDEPENDENT_AMBULATORY_CARE_PROVIDER_SITE_OTHER): Payer: Medicare Other | Admitting: Nurse Practitioner

## 2020-10-12 ENCOUNTER — Other Ambulatory Visit: Payer: Self-pay

## 2020-10-12 VITALS — BP 146/84 | HR 77 | Temp 98.0°F | Ht 67.2 in | Wt 168.0 lb

## 2020-10-12 DIAGNOSIS — Z6826 Body mass index (BMI) 26.0-26.9, adult: Secondary | ICD-10-CM

## 2020-10-12 DIAGNOSIS — I10 Essential (primary) hypertension: Secondary | ICD-10-CM

## 2020-10-12 DIAGNOSIS — G894 Chronic pain syndrome: Secondary | ICD-10-CM

## 2020-10-12 DIAGNOSIS — E1165 Type 2 diabetes mellitus with hyperglycemia: Secondary | ICD-10-CM

## 2020-10-12 DIAGNOSIS — Z794 Long term (current) use of insulin: Secondary | ICD-10-CM

## 2020-10-12 DIAGNOSIS — E663 Overweight: Secondary | ICD-10-CM | POA: Diagnosis not present

## 2020-10-12 MED ORDER — CYCLOBENZAPRINE HCL 10 MG PO TABS: 10.0000 mg | ORAL_TABLET | Freq: Every day | ORAL | 0 refills | Status: DC

## 2020-10-12 NOTE — Patient Instructions (Signed)
Diabetes Mellitus and Exercise Exercising regularly is important for your overall health, especially when you have diabetes (diabetes mellitus). Exercising is not only about losing weight. It has many other health benefits, such as increasing muscle strength and bone density and reducing body fat and stress. This leads to improved fitness, flexibility, and endurance, all of which result in better overall health. Exercise has additional benefits for people with diabetes, including:  Reducing appetite.  Helping to lower and control blood glucose.  Lowering blood pressure.  Helping to control amounts of fatty substances (lipids) in the blood, such as cholesterol and triglycerides.  Helping the body to respond better to insulin (improving insulin sensitivity).  Reducing how much insulin the body needs.  Decreasing the risk for heart disease by: ? Lowering cholesterol and triglyceride levels. ? Increasing the levels of good cholesterol. ? Lowering blood glucose levels. What is my activity plan? Your health care provider or certified diabetes educator can help you make a plan for the type and frequency of exercise (activity plan) that works for you. Make sure that you:  Do at least 150 minutes of moderate-intensity or vigorous-intensity exercise each week. This could be brisk walking, biking, or water aerobics. ? Do stretching and strength exercises, such as yoga or weightlifting, at least 2 times a week. ? Spread out your activity over at least 3 days of the week.  Get some form of physical activity every day. ? Do not go more than 2 days in a row without some kind of physical activity. ? Avoid being inactive for more than 30 minutes at a time. Take frequent breaks to walk or stretch.  Choose a type of exercise or activity that you enjoy, and set realistic goals.  Start slowly, and gradually increase the intensity of your exercise over time. What do I need to know about managing my  diabetes?   Check your blood glucose before and after exercising. ? If your blood glucose is 240 mg/dL (13.3 mmol/L) or higher before you exercise, check your urine for ketones. If you have ketones in your urine, do not exercise until your blood glucose returns to normal. ? If your blood glucose is 100 mg/dL (5.6 mmol/L) or lower, eat a snack containing 15-20 grams of carbohydrate. Check your blood glucose 15 minutes after the snack to make sure that your level is above 100 mg/dL (5.6 mmol/L) before you start your exercise.  Know the symptoms of low blood glucose (hypoglycemia) and how to treat it. Your risk for hypoglycemia increases during and after exercise. Common symptoms of hypoglycemia can include: ? Hunger. ? Anxiety. ? Sweating and feeling clammy. ? Confusion. ? Dizziness or feeling light-headed. ? Increased heart rate or palpitations. ? Blurry vision. ? Tingling or numbness around the mouth, lips, or tongue. ? Tremors or shakes. ? Irritability.  Keep a rapid-acting carbohydrate snack available before, during, and after exercise to help prevent or treat hypoglycemia.  Avoid injecting insulin into areas of the body that are going to be exercised. For example, avoid injecting insulin into: ? The arms, when playing tennis. ? The legs, when jogging.  Keep records of your exercise habits. Doing this can help you and your health care provider adjust your diabetes management plan as needed. Write down: ? Food that you eat before and after you exercise. ? Blood glucose levels before and after you exercise. ? The type and amount of exercise you have done. ? When your insulin is expected to peak, if you use   insulin. Avoid exercising at times when your insulin is peaking.  When you start a new exercise or activity, work with your health care provider to make sure the activity is safe for you, and to adjust your insulin, medicines, or food intake as needed.  Drink plenty of water while  you exercise to prevent dehydration or heat stroke. Drink enough fluid to keep your urine clear or pale yellow. Summary  Exercising regularly is important for your overall health, especially when you have diabetes (diabetes mellitus).  Exercising has many health benefits, such as increasing muscle strength and bone density and reducing body fat and stress.  Your health care provider or certified diabetes educator can help you make a plan for the type and frequency of exercise (activity plan) that works for you.  When you start a new exercise or activity, work with your health care provider to make sure the activity is safe for you, and to adjust your insulin, medicines, or food intake as needed. This information is not intended to replace advice given to you by your health care provider. Make sure you discuss any questions you have with your health care provider. Document Revised: 05/09/2017 Document Reviewed: 03/26/2016 Elsevier Patient Education  2020 Elsevier Inc.  

## 2020-10-12 NOTE — Progress Notes (Signed)
Rutherford Nail as a Education administrator for [employer], NP.,have documented all relevant documentation on the behalf of [employer], NP,as directed by  Bary Castilla, NP while in the presence of Bary Castilla, NP. This visit occurred during the SARS-CoV-2 public health emergency.  Safety protocols were in place, including screening questions prior to the visit, additional usage of staff PPE, and extensive cleaning of exam room while observing appropriate contact time as indicated for disinfecting solutions.  Subjective:     Patient ID: Kathryn Walsh , female    DOB: 1955-11-15 , 64 y.o.   MRN: [id number]   Chief Complaint  Patient presents with  . Diabetes    HPI  She is here today for diabetes check.  She is also here for hypertension medication.  Sh reports compliance with meds.  She is also here back pain. She has been hurting really bad. Started last week. She was bending down to rank up some leaves. She was taking muscle relaxer in the past that has helped [place]. She has a history of liver transplant.  She is going to get eye exam in January 2022.   Diet: eating more vegetables. Have cut down fried food.  Exercise:  She is walking. She also walks. 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   Wt Readings from Last 3 Encounters: 10/12/20 : 168 lb (76.2 kg) 08/25/20 : 171 lb 3.2 oz (77.7 kg) 04/12/20 : 164 lb 9.6 oz (74.7 kg)    Diabetes She presents for her follow-up diabetic visit. She has type 2 diabetes mellitus. Her disease course has been worsening. There are no hypoglycemic associated symptoms. Pertinent negatives for hypoglycemia include no dizziness or headaches. Pertinent negatives for diabetes include no blurred vision, no chest pain, no fatigue, no polydipsia, no polyphagia and no polyuria. There are no hypoglycemic complications. Risk factors for coronary artery disease include diabetes mellitus, dyslipidemia, hypertension and sedentary lifestyle. Her  weight is decreasing steadily. She is following a generally healthy diet. When asked about meal planning, she reported none. She has not had a previous visit with a dietitian. She participates in exercise intermittently. Her breakfast blood glucose is taken between 9-10 am. Her breakfast blood glucose range is generally 140-180 mg/dl. She does not see a podiatrist.Eye exam current: Has a appt coming up.   Hypertension This is a chronic problem. The current episode started more than 1 year ago. The problem has been gradually improving since onset. The problem is controlled. Pertinent negatives include no blurred vision, chest pain, headaches, palpitations or shortness of breath. The current treatment provides moderate improvement. Compliance problems include exercise.      Past Medical History:  Diagnosis Date  . Diabetes mellitus without complication ([place])   . High cholesterol   . HTN (hypertension)   . Liver transplant recipient [facility])      Family History  Problem Relation Age of Onset  . Healthy Mother   . Healthy Father      Current Outpatient Medications:  .  amLODipine (NORVASC) 5 MG tablet, TAKE 1 TABLET BY MOUTH EVERY DAY, Disp: 90 tablet, Rfl: 2 .  aspirin 81 MG chewable tablet, Chew 81 mg by mouth daily., Disp: , Rfl:  .  azaTHIOprine (IMURAN) 50 MG tablet, Take 100 mg by mouth 2 (two) times daily. , Disp: , Rfl:  .  Blood Glucose Monitoring Suppl (ACCU-CHEK GUIDE ME) w/Device KIT, Use to check blood sugar 3 times a day. Dx code e11.65, Disp: 1 kit, Rfl: 3 .  Cholecalciferol 25 MCG (1000 UT) capsule, Take 4,000 Units by mouth daily., Disp: , Rfl:  .  glucose blood (ACCU-CHEK GUIDE) test strip, Use as instructed to check blood sugar 3 times a day. Dx code e11.65, Disp: 100 each, Rfl: 12 .  insulin degludec (TRESIBA FLEXTOUCH) 200 UNIT/ML FlexTouch Pen, Inject 42 Units into the skin. Pt doing 32 units qhs, Disp: , Rfl:  .  Insulin Lispro (HUMALOG KWIKPEN) 200 UNIT/ML SOPN, Inject 6  Units into the skin 2 (two) times daily before a meal. Please inject 6 units before breakfast and dinner; max dose titration 40 units daily, Disp: 5 pen, Rfl: 5 .  pravastatin (PRAVACHOL) 40 MG tablet, TAKE 1 TABLET BY MOUTH EVERYDAY AT BEDTIME, Disp: 90 tablet, Rfl: 1 .  propranolol (INDERAL) 20 MG tablet, Take 20 mg by mouth 2 (two) times daily. Once daily, Disp: , Rfl:  .  tacrolimus (PROGRAF) 1 MG capsule, Take 2 mg in am and 3 mg in pm, Disp: , Rfl:  .  Turmeric 500 MG TABS, Take by mouth., Disp: , Rfl:  .  cyclobenzaprine (FLEXERIL) 10 MG tablet, Take 1 tablet (10 mg total) by mouth at bedtime., Disp: 30 tablet, Rfl: 0   No Known Allergies   Review of Systems  Constitutional: Negative.  Negative for fatigue.  HENT: Negative.   Eyes: Negative.  Negative for blurred vision.  Respiratory: Negative.  Negative for shortness of breath.   Cardiovascular: Negative.  Negative for chest pain and palpitations.  Gastrointestinal: Negative.   Endocrine: Negative for polydipsia, polyphagia and polyuria.  Musculoskeletal: Positive for back pain.  Skin: Negative.   Neurological: Negative for dizziness and headaches.  Psychiatric/Behavioral: Negative.      Today's Vitals   10/12/20 0932  BP: (!) 146/84  Pulse: 77  Temp: 98 F (36.7 C)  TempSrc: Oral  Weight: 168 lb (76.2 kg)  Height: 5' 7.2" (1.707 m)   Body mass index is 26.16 kg/m.  Wt Readings from Last 3 Encounters:  10/12/20 168 lb (76.2 kg)  08/25/20 171 lb 3.2 oz (77.7 kg)  04/12/20 164 lb 9.6 oz (74.7 kg)     Objective:  Physical Exam Vitals reviewed.  Constitutional:      Appearance: Normal appearance.  Cardiovascular:     Rate and Rhythm: Normal rate and regular rhythm.     Pulses: Normal pulses.     Heart sounds: Normal heart sounds. No murmur heard.   Pulmonary:     Effort: Pulmonary effort is normal. No respiratory distress.     Breath sounds: Normal breath sounds. No wheezing.  Musculoskeletal:      Comments: Hx of scoliosis   Skin:    General: Skin is warm and dry.  Neurological:     Mental Status: She is alert.      Assessment And Plan:     1. Uncontrolled type 2 diabetes mellitus with hyperglycemia ([place]) -Patient taking insulin -IT IS IMPORTANT TO MAINTAIN OPTIMAL BLOOD SUGAR CONTROL TO REDUCE YOUR RISK OF SERIOUS DIABETES COMPLICATIONS INCLUDING EYE AND KIDNEY DISEASE.  BLOOD SUGARS SHOULD BE LESS THAN 110 BEFORE MEALS AND LESS THAN 140 TWO HOURS AFTER MEALS.  PLEASE TEST BS THREE TIMES DAILY. BRING YOUR BLOOD SUGAR READINGS FOR REVIEW.  PLEASE MAINTAIN A HEALTHY, WELL BALANCED DIET FREE OF PROCESSED FOODS AND REFINED SUGARS.  MAINTAIN YOUR REGULAR EXERCISE REGIMEN.  PLS NOTIFY OFFICE FOR UNCONTROLLED NAUSEA, VOMITING, DIARRHEA, POOR ORAL INTAKE AND FOR CONSISTENT BS READINGS ABOVE 350. - Will check Hemoglobin  A1c today   2. Primary hypertension - She will continue with current meds. She is encouraged to avoid adding salt to her foods. She will follow up in 3 months. - BMP8+eGFR  3. Chronic pain syndrome -Chronic - cyclobenzaprine (FLEXERIL) 10 MG tablet; Take 1 tablet (10 mg total) by mouth at bedtime.  Dispense: 30 tablet; Refill: 0  4. Overweight with body mass index (BMI) of 26 to 26.9 in adult - SHE WAS ENCOURAGED TO EXERCISE 5/7 DAYS PER WEEK AND TO AVOID SUGARY BEVERAGES AND PROCESSED FOODS. Will F/U IN 3 MONTHS. -She was encouraged to continue to work on her diet, avoid sugary foods and drinks. Avoid fast food and increase her intake of fiber. Avoid high carb foods such as pasta and bread.   Patient was given opportunity to ask questions. Patient verbalized understanding of the plan and was able to repeat key elements of the plan. All questions were answered to their satisfaction.  Bary Castilla, NP   I, Bary Castilla, NP, have reviewed all documentation for this visit. The documentation on 10/12/20 for the exam, diagnosis, procedures, and orders are all  accurate and complete.  THE PATIENT IS ENCOURAGED TO PRACTICE SOCIAL DISTANCING DUE TO THE COVID-19 PANDEMIC.

## 2020-10-13 LAB — BMP8+EGFR
BUN/Creatinine Ratio: 19 (ref 12–28)
BUN: 12 mg/dL (ref 8–27)
CO2: 23 mmol/L (ref 20–29)
Calcium: 8.9 mg/dL (ref 8.7–10.3)
Chloride: 104 mmol/L (ref 96–106)
Creatinine, Ser: 0.64 mg/dL (ref 0.57–1.00)
GFR calc Af Amer: 109 mL/min/{1.73_m2} (ref >59)
GFR calc non Af Amer: 95 mL/min/{1.73_m2} (ref >59)
Glucose: 167 mg/dL — ABNORMAL HIGH (ref 65–99)
Potassium: 4.2 mmol/L (ref 3.5–5.2)
Sodium: 140 mmol/L (ref 134–144)

## 2020-10-13 LAB — HEMOGLOBIN A1C
Est. average glucose Bld gHb Est-mCnc: 137 mg/dL
Hgb A1c MFr Bld: 6.4 % — ABNORMAL HIGH (ref 4.8–5.6)

## 2020-10-18 ENCOUNTER — Telehealth: Payer: Medicare Other

## 2020-11-04 ENCOUNTER — Telehealth: Payer: Self-pay

## 2020-11-04 NOTE — Chronic Care Management (AMB) (Signed)
Chronic Care Management Pharmacy Assistant   Name: Kathryn Walsh  MRN: [id number] DOB: 1956/02/05  Reason for Encounter: Disease State - Diabetes,Hypertension and Lipid Adherence Call.  Patient Questions:  1.  Have you seen any other providers since your last visit? Yes.\ 10/12/2020 Bary Castilla , NP - Diabetes    2.  Any changes in your medicines or health? Cyclobenzaprine HCI 10 mg daily at bedtime    PCP : Glendale Chard, MD  Allergies:  No Known Allergies  Medications: Outpatient Encounter Medications as of 11/04/2020  Medication Sig   amLODipine (NORVASC) 5 MG tablet TAKE 1 TABLET BY MOUTH EVERY DAY   aspirin 81 MG chewable tablet Chew 81 mg by mouth daily.   azaTHIOprine (IMURAN) 50 MG tablet Take 100 mg by mouth 2 (two) times daily.    Blood Glucose Monitoring Suppl (ACCU-CHEK GUIDE ME) w/Device KIT Use to check blood sugar 3 times a day. 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   cyclobenzaprine (FLEXERIL) 10 MG tablet Take 1 tablet (10 mg total) by mouth at bedtime.   glucose blood (ACCU-CHEK GUIDE) test strip Use as instructed to check blood sugar 3 times a day. 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 Pt doing 32 units qhs   Insulin Lispro (HUMALOG KWIKPEN) 200 UNIT/ML SOPN Inject 6 Units into the skin 2 (two) times daily before a meal. Please inject 6 units before breakfast and dinner; max dose titration 40 units daily   pravastatin (PRAVACHOL) 40 MG tablet TAKE 1 TABLET BY MOUTH EVERYDAY AT BEDTIME   propranolol (INDERAL) 20 MG tablet Take 20 mg by mouth 2 (two) times daily. Once daily   tacrolimus (PROGRAF) 1 MG capsule Take 2 mg in am and 3 mg in pm   Turmeric 500 MG TABS Take by mouth.   No facility-administered encounter medications on file as of 11/04/2020.    Current Diagnosis: Patient Active Problem List   Diagnosis  Date Noted   Pap smear for cervical cancer screening 01/04/2014   Chronic pain syndrome 01/04/2014   Diabetes ([place]) 10/06/2013   Depression 10/06/2013   Hyperglycemia 07/31/2013   Cellulitis and abscess 07/30/2013   Immunosuppressed status ([place]) 07/30/2013   HTN (hypertension) 07/22/2013   S/P liver transplant ([place]) 07/11/2013   HCV (hepatitis C virus) 07/11/2013   Type II diabetes mellitus, uncontrolled ([place]) 07/11/2013   Recent Relevant Labs: Lab Results  Component Value Date/Time   HGBA1C 6.4 (H) 10/12/2020 10:57 AM   HGBA1C 6.9 (H) 04/12/2020 09:52 AM   MICROALBUR 150 04/12/2020 09:55 AM   MICROALBUR 150 04/07/2019 11:35 AM    Kidney Function Lab Results  Component Value Date/Time   CREATININE 0.64 10/12/2020 10:57 AM   CREATININE 0.66 04/12/2020 09:52 AM   GFRNONAA 95 10/12/2020 10:57 AM   GFRAA 109 10/12/2020 10:57 AM    Current antihyperglycemic regimen:  o Tresiba 42 units at bedtime o Humalog Kwikpen 6 units twice daily before a meal o   What recent interventions/DTPs have been made to improve glycemic control:  o Patient states she has been taking he medications as directed by providers.   Have there been any recent hospitalizations or ED visits since last visit with CPP? No   Patient denies hypoglycemic symptoms, including Pale, Sweaty, Shaky, Hungry, Nervous/irritable and Vision changes  Patient denies hyperglycemic symptoms, including blurry vision, excessive thirst, fatigue,  polyuria and weakness   How often are you checking your blood sugar? Twice a day   What are your blood sugars ranging?  o Fasting: 11/03/2020 - 145 o Fasting: 11/04/2020 - 162 o Before meals: None o After meals: None o Bedtime: None  During the week, how often does your blood glucose drop below 70? No   Are you checking your feet daily/regularly? Yes Patient denies any open sores, numbness or pain.  Adherence Review: Is the patient currently on a STATIN  medication? Yes Is the patient currently on ACE/ARB medication? No Does the patient have >5 day gap between last estimated fill dates? No Reviewed chart prior to disease state call. Spoke with patient regarding BP  Recent Office Vitals: BP Readings from Last 3 Encounters:  10/12/20 (!) 146/84  08/25/20 (!) 150/80  04/12/20 (!) 144/76   Pulse Readings from Last 3 Encounters:  10/12/20 77  08/25/20 81  04/12/20 76    Wt Readings from Last 3 Encounters:  10/12/20 168 lb (76.2 kg)  08/25/20 171 lb 3.2 oz (77.7 kg)  04/12/20 164 lb 9.6 oz (74.7 kg)     Kidney Function Lab Results  Component Value Date/Time   CREATININE 0.64 10/12/2020 10:57 AM   CREATININE 0.66 04/12/2020 09:52 AM   GFRNONAA 95 10/12/2020 10:57 AM   GFRAA 109 10/12/2020 10:57 AM    BMP Latest Ref Rng & Units 10/12/2020 04/12/2020 12/21/2019  Glucose 65 - 99 mg/dL 167(H) 127(H) 276(H)  BUN 8 - 27 mg/dL '12 9 13  ' Creatinine 0.57 - 1.00 mg/dL 0.64 0.66 0.76  BUN/Creat Ratio 12 - '28 19 14 17  ' Sodium 134 - 144 mmol/L 140 139 137  Potassium 3.5 - 5.2 mmol/L 4.2 4.5 4.3  Chloride 96 - 106 mmol/L 104 102 101  CO2 20 - 29 mmol/L '23 24 25  ' Calcium 8.7 - 10.3 mg/dL 8.9 9.2 8.8    Current antihypertensive regimen:  o Amlodipine 30m daily o Propanolol 20 mg twice daily o   How often are you checking your Blood Pressure? Patient has not been taking at home   Current home BP readings: None   What recent interventions/DTPs have been made by any provider to improve Blood Pressure control since last CPP Visit: Patient states she has been taking her medications as directed by provider.   Any recent hospitalizations or ED visits since last visit with CPP? No   What diet changes have been made to improve Blood Pressure Control?              Patient states she stopped eating salts, he replaced her salts              while cooking to herbs, Ginger, Thymes, Basil. States that she             is watching her fats/ sugars/  red meats. o   What exercise is being done to improve your Blood Pressure Control?  o Patient states she is active, she has been working a few days a week. o   Adherence Review: Is the patient currently on ACE/ARB medication? No  Does the patient have >5 day gap between last estimated fill dates? No    Comprehensive medication review performed; Spoke to patient regarding cholesterol  Lipid Panel    Component Value Date/Time   CHOL 143 04/12/2020 0952   TRIG 202 (H) 04/12/2020 0952   HDL 40 04/12/2020 0952   LDLCALC 69 04/12/2020 0952    10-year  ASCVD risk score: The 10-year ASCVD risk score Mikey Bussing DC Jr., et al., 2013) is: 38.5%   Values used to calculate the score:     Age: 75 years     Sex: Female     Is Non-Hispanic African American: Yes     Diabetic: Yes     Tobacco smoker: Yes     Systolic Blood Pressure: 242 mmHg     Is BP treated: Yes     HDL Cholesterol: 40 mg/dL     Total Cholesterol: 143 mg/dL   Current antihyperlipidemic regimen:  o Pravastatin 40 mg daily at bedtime o   Previous antihyperlipidemic medications tried: None   ASCVD risk enhancing conditions: age >31, DM and HTN    What recent interventions/DTPs have been made by any provider to improve Cholesterol control since last CPP Visit: Patient states she does take her medications as directed by provider.   Any recent hospitalizations or ED visits since last visit with CPP? No   What diet changes have been made to improve Cholesterol?  o Patient states she stopped eating salts, he replaced her salts while cooking to herbs, Ginger, Thymes, Basil. States that she is watching her fats/ sugars/ red meats. o   What exercise is being done to improve Cholesterol?  o Patient states she is active, she has been working some part time at job.  Adherence Review: Does the patient have >5 day gap between last estimated fill dates? No      Goals Addressed            This Visit's Progress     Pharmacy Care Plan   On track    CARE PLAN ENTRY (see longitudinal plan of care for additional care plan information)  Current Barriers:   Chronic Disease Management support, education, and care coordination needs related to Hypertension, Hyperlipidemia, and Diabetes   Hypertension BP Readings from Last 3 Encounters:  04/12/20 (!) 144/76  12/21/19 126/70  07/02/19 138/70    Pharmacist Clinical Goal(s): o Over the next 90 days, patient will work with PharmD and providers to maintain BP goal <130/80  Current regimen:  o Amlodipine 56m daily o Propanolol 266mtwice daily  Interventions: o Recommend patient check BP at home occasionally and if symptomatic o Determined patient was only taking propranolol once daily - Advised patient to start taking propranolol twice daily as directed  Patient self care activities - Over the next 90 days, patient will: o Check BP weekly and if symptomatic, document, and provide at future appointments o Ensure daily salt intake < 2300 mg/day o Take propranolol 2074mwice daily as directed o Increase exercise as able with a goal of 30 minutes daily 5 times per week (150 minutes per week)  Hyperlipidemia Lab Results  Component Value Date/Time   LDLCALC 69 04/12/2020 09:52 AM    Pharmacist Clinical Goal(s): o Over the next 180 days, patient will work with PharmD and providers to maintain LDL goal < 70  Current regimen:  o Pravastatin 73m69mily at bedtime  Interventions: o Discussed appropriate goals for LDL (less than 70), HDL (greater than 50) and triglycerides (less than 150) o Provided dietary and exercise recommendations  Patient self care activities - Over the next 180 days, patient will: o Limit fried foods and fatty foods o Increase exercise as able with a goal of 30 minutes daily 5 times per week (150 minutes per week)  Diabetes Lab Results  Component Value Date/Time   HGBA1C 6.9 (H)  04/12/2020 09:52 AM   HGBA1C 12.0 (H)  12/21/2019 04:43 PM    Pharmacist Clinical Goal(s): o Over the next 90 days, patient will work with PharmD and providers to maintain A1c goal <7%  Current regimen:  o Tresiba 42 units at bedtime o Humalog Kwikpen 6 units twice daily before a meal  Interventions: o Provided dietary and exercise recommendations o Assist with reordering refill for Tresiba from patient assistance program o Recommend patient check blood sugar if she wakes up in the middle of the night sweating o Collaborate with PCP staff to send in refill for Accu Check Guide Me test strips for blood glucose meter Discussed hypoglycemia treatment (juice, hard candy, etc)  Patient self care activities - Over the next 90 days, patient will: o Check blood sugar twice daily, document, and provide at future appointments o Contact provider with any episodes of hypoglycemia o Increase exercise as able with a goal of 30 minutes daily 5 times per week (150 minutes per week) o Focus on well-balanced meal utilizing the PLATE method  Medication management  Pharmacist Clinical Goal(s): o Over the next 90 days, patient will work with PharmD and providers to maintain optimal medication adherence  Current pharmacy: [facility]  Interventions o Comprehensive medication review performed. o Continue current medication management strategy o Collaborate with PCP staff to send in refill for cyclobenzaprine o Recommend patient receive Shingrix vaccine o Recommend DEXA scan and Vitamin D level  Patient self care activities - Over the next 90 days, patient will: o Focus on medication adherence by considering using a pill box o Take medications as prescribed o Report any questions or concerns to PharmD and/or provider(s)  Initial goal documentation        Follow-Up:  Pharmacist Review - Patient states she missed her last appointment with [facility] , because of weather and transportation , she said that she is going to call them this  week to reschedule appointment. Patient did express some concerns about the cost to drive to [facility] for her appointments.  Called [facility] - They do not offer transportation service that goes to [place] , but Helene Kelp said that some times a patient insurance helps with cost. Called patient to give her this information about her insurance , Left message for her to call me back.  [name], CPP Notified  Judithann Sheen, [place] Pharmacist Assistant [phone]

## 2020-11-09 ENCOUNTER — Telehealth: Payer: Medicare Other

## 2020-11-09 ENCOUNTER — Telehealth: Payer: Self-pay

## 2020-11-09 NOTE — Telephone Encounter (Signed)
  Chronic Care Management   Outreach Note  11/09/2020 Name: Kathryn Walsh MRN: [id number] DOB: 01-Dec-1955  Referred by: Glendale Chard, MD Reason for referral : Care Coordination   An unsuccessful telephone outreach was attempted today. The patient was referred to the case management team for assistance with care management and care coordination.   Follow Up Plan: The care management team will reach out to the patient again over the next 30 days.   Daneen Schick, BSW, CDP Social Worker, Certified Dementia Practitioner [place] [facility] [place] [facility] [phone]

## 2020-11-17 ENCOUNTER — Telehealth: Payer: Self-pay

## 2020-11-17 NOTE — Chronic Care Management (AMB) (Signed)
° ° °  Chronic Care Management Pharmacy Assistant   Name: Nadine Ryle  MRN: [id number] DOB: February 16, 1956  Reason for Encounter: Patient Assistance Coordination   PCP : Glendale Chard, MD  Allergies:  No Known Allergies  Medications: Outpatient Encounter Medications as of 11/17/2020  Medication Sig   amLODipine (NORVASC) 5 MG tablet TAKE 1 TABLET BY MOUTH EVERY DAY   aspirin 81 MG chewable tablet Chew 81 mg by mouth daily.   azaTHIOprine (IMURAN) 50 MG tablet Take 100 mg by mouth 2 (two) times daily.    Blood Glucose Monitoring Suppl (ACCU-CHEK GUIDE ME) w/Device KIT Use to check blood sugar 3 times a day. 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   cyclobenzaprine (FLEXERIL) 10 MG tablet Take 1 tablet (10 mg total) by mouth at bedtime.   glucose blood (ACCU-CHEK GUIDE) test strip Use as instructed to check blood sugar 3 times a day. 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 Pt doing 32 units qhs   Insulin Lispro (HUMALOG KWIKPEN) 200 UNIT/ML SOPN Inject 6 Units into the skin 2 (two) times daily before a meal. Please inject 6 units before breakfast and dinner; max dose titration 40 units daily   pravastatin (PRAVACHOL) 40 MG tablet TAKE 1 TABLET BY MOUTH EVERYDAY AT BEDTIME   propranolol (INDERAL) 20 MG tablet Take 20 mg by mouth 2 (two) times daily. Once daily   tacrolimus (PROGRAF) 1 MG capsule Take 2 mg in am and 3 mg in pm   Turmeric 500 MG TABS Take by mouth.   No facility-administered encounter medications on file as of 11/17/2020.    Current Diagnosis: Patient Active Problem List   Diagnosis Date Noted   Pap smear for cervical cancer screening 01/04/2014   Chronic pain syndrome 01/04/2014   Diabetes ([place]) 10/06/2013   Depression 10/06/2013   Hyperglycemia 07/31/2013   Cellulitis and abscess 07/30/2013   Immunosuppressed status ([place])  07/30/2013   HTN (hypertension) 07/22/2013   S/P liver transplant ([place]) 07/11/2013   HCV (hepatitis C virus) 07/11/2013   Type II diabetes mellitus, uncontrolled ([place]) 07/11/2013   Follow-Up:  Patient Assistance Coordination-The patient was called and asked to come into the office in the morning any day next week to provide proof of income and sign application forms for her [facility] medication. The patient verbalized understand. Patient stated she will come in the office and provide what is needed.   Notified [name], CPP.  Raynelle Highland, [place] Pharmacist Assistant [phone]

## 2020-11-22 ENCOUNTER — Telehealth: Payer: Self-pay

## 2020-11-22 NOTE — Progress Notes (Signed)
11/22/20-Called and spoke with the patient reminding her of upcoming CCM Call appointment on 1/26 at 1:00 pm with [name], CPP. The patient was made aware to have medications and supplements nearby during phone call with CPP, [name]. Patient verbalized understanding.  Notified [name], CPP.  Raynelle Highland, [place] Pharmacist Assistant [phone]

## 2020-11-23 ENCOUNTER — Telehealth: Payer: Medicare Other

## 2020-12-01 ENCOUNTER — Telehealth: Payer: Medicare Other

## 2020-12-01 ENCOUNTER — Telehealth: Payer: Self-pay

## 2020-12-01 NOTE — Telephone Encounter (Signed)
  Chronic Care Management   Outreach Note  12/01/2020 Name: Kathryn Walsh MRN: [id number] DOB: August 30, 1956  Referred by: Glendale Chard, MD Reason for referral : Care Coordination   A second unsuccessful telephone outreach was attempted today. The patient was referred to the case management team for assistance with care management and care coordination.   Follow Up Plan: The care management team will reach out to the patient again over the next 21 days.   Daneen Schick, BSW, CDP Social Worker, Certified Dementia Practitioner [place] [facility] [place] [facility] [phone]

## 2020-12-12 ENCOUNTER — Other Ambulatory Visit: Payer: Self-pay | Admitting: Nurse Practitioner

## 2020-12-12 DIAGNOSIS — G894 Chronic pain syndrome: Secondary | ICD-10-CM

## 2020-12-19 ENCOUNTER — Ambulatory Visit: Payer: Self-pay

## 2020-12-19 ENCOUNTER — Telehealth: Payer: Medicare Other

## 2020-12-19 DIAGNOSIS — E1165 Type 2 diabetes mellitus with hyperglycemia: Secondary | ICD-10-CM

## 2020-12-19 DIAGNOSIS — I10 Essential (primary) hypertension: Secondary | ICD-10-CM

## 2020-12-19 NOTE — Chronic Care Management (AMB) (Signed)
  Chronic Care Management   Outreach Note  12/19/2020 Name: Kathryn Walsh MRN: [id number] DOB: [birth year]  Referred by: Glendale Chard, MD Reason for referral : Care Coordination   Third unsuccessful telephone outreach was attempted today. The patient was referred to the case management team for assistance with care management and care coordination. The patient's primary care provider has been notified of our unsuccessful attempts to make or maintain contact with the patient. The care management team is pleased to engage with this patient at any time in the future should he/she be interested in assistance from the care management team.     Follow Up Plan: No SW follow up planned at this time due to inability to maintain patient contact.The patient will remain engaged with embedded pharmacy team.  Daneen Schick, BSW, CDP Social Worker, Certified Dementia Practitioner [place] [facility] [place] [facility] [phone]

## 2020-12-22 DIAGNOSIS — Z4823 Encounter for aftercare following liver transplant: Secondary | ICD-10-CM | POA: Diagnosis not present

## 2020-12-22 DIAGNOSIS — Z5181 Encounter for therapeutic drug level monitoring: Secondary | ICD-10-CM | POA: Diagnosis not present

## 2020-12-22 DIAGNOSIS — D84821 Immunodeficiency due to drugs: Secondary | ICD-10-CM | POA: Diagnosis not present

## 2020-12-22 DIAGNOSIS — F17210 Nicotine dependence, cigarettes, uncomplicated: Secondary | ICD-10-CM | POA: Diagnosis not present

## 2020-12-22 DIAGNOSIS — K754 Autoimmune hepatitis: Secondary | ICD-10-CM | POA: Diagnosis not present

## 2020-12-22 DIAGNOSIS — Z79899 Other long term (current) drug therapy: Secondary | ICD-10-CM | POA: Diagnosis not present

## 2020-12-22 DIAGNOSIS — D849 Immunodeficiency, unspecified: Secondary | ICD-10-CM | POA: Diagnosis not present

## 2020-12-22 DIAGNOSIS — Z8379 Family history of other diseases of the digestive system: Secondary | ICD-10-CM | POA: Diagnosis not present

## 2020-12-22 DIAGNOSIS — K8689 Other specified diseases of pancreas: Secondary | ICD-10-CM | POA: Diagnosis not present

## 2020-12-22 DIAGNOSIS — Z944 Liver transplant status: Secondary | ICD-10-CM | POA: Diagnosis not present

## 2020-12-26 ENCOUNTER — Encounter: Payer: Self-pay | Admitting: Internal Medicine

## 2021-01-10 ENCOUNTER — Ambulatory Visit (INDEPENDENT_AMBULATORY_CARE_PROVIDER_SITE_OTHER): Payer: Medicare Other | Admitting: Internal Medicine

## 2021-01-10 ENCOUNTER — Other Ambulatory Visit: Payer: Self-pay

## 2021-01-10 ENCOUNTER — Encounter: Payer: Self-pay | Admitting: Internal Medicine

## 2021-01-10 VITALS — BP 150/88 | HR 82 | Temp 98.0°F | Ht 67.2 in | Wt 168.8 lb

## 2021-01-10 DIAGNOSIS — I10 Essential (primary) hypertension: Secondary | ICD-10-CM

## 2021-01-10 DIAGNOSIS — E2839 Other primary ovarian failure: Secondary | ICD-10-CM | POA: Diagnosis not present

## 2021-01-10 DIAGNOSIS — R350 Frequency of micturition: Secondary | ICD-10-CM | POA: Diagnosis not present

## 2021-01-10 DIAGNOSIS — Z944 Liver transplant status: Secondary | ICD-10-CM

## 2021-01-10 DIAGNOSIS — D849 Immunodeficiency, unspecified: Secondary | ICD-10-CM

## 2021-01-10 DIAGNOSIS — E1165 Type 2 diabetes mellitus with hyperglycemia: Secondary | ICD-10-CM | POA: Diagnosis not present

## 2021-01-10 DIAGNOSIS — F17210 Nicotine dependence, cigarettes, uncomplicated: Secondary | ICD-10-CM | POA: Diagnosis not present

## 2021-01-10 LAB — POCT URINALYSIS DIPSTICK
Bilirubin, UA: NEGATIVE
Glucose, UA: NEGATIVE
Ketones, UA: NEGATIVE
Nitrite, UA: NEGATIVE
Protein, UA: POSITIVE — AB
Spec Grav, UA: 1.02 (ref 1.010–1.025)
Urobilinogen, UA: 1 E.U./dL
pH, UA: 5.5 (ref 5.0–8.0)

## 2021-01-10 NOTE — Patient Instructions (Signed)
Diabetes Mellitus and Exercise Exercising regularly is important for overall health, especially for people who have diabetes mellitus. Exercising is not only about losing weight. It has many other health benefits, such as increasing muscle strength and bone density and reducing body fat and stress. This leads to improved fitness, flexibility, and endurance, all of which result in better overall health. What are the benefits of exercise if I have diabetes? Exercise has many benefits for people with diabetes. They include:  Helping to lower and control blood sugar (glucose).  Helping the body to respond better to the hormone insulin by improving insulin sensitivity.  Reducing how much insulin the body needs.  Lowering the risk for heart disease by: ? Lowering "bad" cholesterol and triglyceride levels. ? Increasing "good" cholesterol levels. ? Lowering blood pressure. ? Lowering blood glucose levels. What is my activity plan? Your health care provider or certified diabetes educator can help you make a plan for the type and frequency of exercise that works for you. This is called your activity plan. Be sure to:  Get at least 150 minutes of medium-intensity or high-intensity exercise each week. Exercises may include brisk walking, biking, or water aerobics.  Do stretching and strengthening exercises, such as yoga or weight lifting, at least 2 times a week.  Spread out your activity over at least 3 days of the week.  Get some form of physical activity each day. ? Do not go more than 2 days in a row without some kind of physical activity. ? Avoid being inactive for more than 90 minutes at a time. Take frequent breaks to walk or stretch.  Choose exercises or activities that you enjoy. Set realistic goals.  Start slowly and gradually increase your exercise intensity over time.   How do I manage my diabetes during exercise? Monitor your blood glucose  Check your blood glucose before and  after exercising. If your blood glucose is: ? 240 mg/dL (13.3 mmol/L) or higher before you exercise, check your urine for ketones. These are chemicals created by the liver. If you have ketones in your urine, do not exercise until your blood glucose returns to normal. ? 100 mg/dL (5.6 mmol/L) or lower, eat a snack containing 15-20 grams of carbohydrate. Check your blood glucose 15 minutes after the snack to make sure that your glucose level is above 100 mg/dL (5.6 mmol/L) before you start your exercise.  Know the symptoms of low blood glucose (hypoglycemia) and how to treat it. Your risk for hypoglycemia increases during and after exercise. Follow these tips and your health care provider's instructions  Keep a carbohydrate snack that is fast-acting for use before, during, and after exercise to help prevent or treat hypoglycemia.  Avoid injecting insulin into areas of the body that are going to be exercised. For example, avoid injecting insulin into: ? Your arms, when you are about to play tennis. ? Your legs, when you are about to go jogging.  Keep records of your exercise habits. Doing this can help you and your health care provider adjust your diabetes management plan as needed. Write down: ? Food that you eat before and after you exercise. ? Blood glucose levels before and after you exercise. ? The type and amount of exercise you have done.  Work with your health care provider when you start a new exercise or activity. He or she may need to: ? Make sure that the activity is safe for you. ? Adjust your insulin, other medicines, and food that   you eat.  Drink plenty of water while you exercise. This prevents loss of water (dehydration) and problems caused by a lot of heat in the body (heat stroke).   Where to find more information  American Diabetes Association: www.diabetes.org Summary  Exercising regularly is important for overall health, especially for people who have diabetes  mellitus.  Exercising has many health benefits. It increases muscle strength and bone density and reduces body fat and stress. It also lowers and controls blood glucose.  Your health care provider or certified diabetes educator can help you make an activity plan for the type and frequency of exercise that works for you.  Work with your health care provider to make sure any new activity is safe for you. Also work with your health care provider to adjust your insulin, other medicines, and the food you eat. This information is not intended to replace advice given to you by your health care provider. Make sure you discuss any questions you have with your health care provider. Document Revised: 07/13/2019 Document Reviewed: 07/13/2019 Elsevier Patient Education  2021 Elsevier Inc.  

## 2021-01-10 NOTE — Progress Notes (Signed)
I,Tianna Badgett,acting as a Education administrator for Maximino Greenland, MD.,have documented all relevant documentation on the behalf of Maximino Greenland, MD,as directed by  Maximino Greenland, MD while in the presence of Maximino Greenland, MD.  This visit occurred during the SARS-CoV-2 public health emergency.  Safety protocols were in place, including screening questions prior to the visit, additional usage of staff PPE, and extensive cleaning of exam room while observing appropriate contact time as indicated for disinfecting solutions.  Subjective:     Patient ID: Kathryn Walsh , female    DOB: 09-17-1956 , 65 y.o.   MRN: [id number]   Chief Complaint  Patient presents with  . Hypertension  . Diabetes    HPI  She is here today for HTN and diabetes check.  The patient will need any labs done faxed to [facility] fax number [phone].  She reports compliance with meds. She denies headaches, chest pain and shortness of breath.   Hypertension This is a chronic problem. The current episode started more than 1 year ago. The problem has been gradually improving since onset. The problem is controlled. Pertinent negatives include no blurred vision, chest pain, headaches, palpitations or shortness of breath. The current treatment provides moderate improvement. Compliance problems include exercise.   Diabetes She presents for her follow-up diabetic visit. She has type 2 diabetes mellitus. Her disease course has been worsening. There are no hypoglycemic associated symptoms. Pertinent negatives for hypoglycemia include no dizziness or headaches. Pertinent negatives for diabetes include no blurred vision, no chest pain, no fatigue, no polydipsia, no polyphagia and no polyuria. There are no hypoglycemic complications. Risk factors for coronary artery disease include diabetes mellitus, dyslipidemia, hypertension and sedentary lifestyle. Her weight is decreasing steadily. She is following a generally healthy diet.  When asked about meal planning, she reported none. She has not had a previous visit with a dietitian. She participates in exercise intermittently. Her breakfast blood glucose is taken between 9-10 am. Her breakfast blood glucose range is generally 140-180 mg/dl. She does not see a podiatrist.Eye exam current: Has a appt coming up.      Past Medical History:  Diagnosis Date  . Diabetes mellitus without complication ([place])   . High cholesterol   . HTN (hypertension)   . Liver transplant recipient [facility])      Family History  Problem Relation Age of Onset  . Healthy Mother   . Healthy Father      Current Outpatient Medications:  .  amLODipine (NORVASC) 5 MG tablet, TAKE 1 TABLET BY MOUTH EVERY DAY, Disp: 90 tablet, Rfl: 2 .  aspirin 81 MG chewable tablet, Chew 81 mg by mouth daily., Disp: , Rfl:  .  azaTHIOprine (IMURAN) 50 MG tablet, Take 100 mg by mouth 2 (two) times daily. , Disp: , Rfl:  .  Blood Glucose Monitoring Suppl (ACCU-CHEK GUIDE ME) w/Device KIT, Use to check blood sugar 3 times a day. Dx code e11.65, Disp: 1 kit, Rfl: 3 .  Cholecalciferol 25 MCG (1000 UT) capsule, Take 4,000 Units by mouth daily., Disp: , Rfl:  .  cyclobenzaprine (FLEXERIL) 10 MG tablet, TAKE 1 TABLET BY MOUTH EVERYDAY AT BEDTIME, Disp: 30 tablet, Rfl: 0 .  glucose blood (ACCU-CHEK GUIDE) test strip, Use as instructed to check blood sugar 3 times a day. Dx code e11.65, Disp: 100 each, Rfl: 12 .  insulin degludec (TRESIBA FLEXTOUCH) 200 UNIT/ML FlexTouch Pen, Inject 42 Units into the skin. Pt doing 32 units qhs, Disp: ,  Rfl:  .  Insulin Lispro (HUMALOG KWIKPEN) 200 UNIT/ML SOPN, Inject 6 Units into the skin 2 (two) times daily before a meal. Please inject 6 units before breakfast and dinner; max dose titration 40 units daily, Disp: 5 pen, Rfl: 5 .  pravastatin (PRAVACHOL) 40 MG tablet, TAKE 1 TABLET BY MOUTH EVERYDAY AT BEDTIME, Disp: 90 tablet, Rfl: 1 .  propranolol (INDERAL) 20 MG tablet, Take 20 mg by mouth 2  (two) times daily. Once daily, Disp: , Rfl:  .  tacrolimus (PROGRAF) 1 MG capsule, Take 2 mg in am and 3 mg in pm, Disp: , Rfl:  .  Turmeric 500 MG TABS, Take by mouth., Disp: , Rfl:    No Known Allergies   Review of Systems  Constitutional: Negative.  Negative for fatigue.  Eyes: Negative for blurred vision.  Respiratory: Negative.  Negative for shortness of breath.   Cardiovascular: Negative.  Negative for chest pain and palpitations.  Gastrointestinal: Negative.   Endocrine: Negative for polydipsia, polyphagia and polyuria.  Neurological: Negative.  Negative for dizziness and headaches.     Today's Vitals   01/10/21 1115  BP: (!) 150/88  Pulse: 82  Temp: 98 F (36.7 C)  TempSrc: Oral  Weight: 168 lb 12.8 oz (76.6 kg)  Height: 5' 7.2" (1.707 m)  PainSc: 0-No pain   Body mass index is 26.28 kg/m.  Wt Readings from Last 3 Encounters:  01/10/21 168 lb 12.8 oz (76.6 kg)  10/12/20 168 lb (76.2 kg)  08/25/20 171 lb 3.2 oz (77.7 kg)   Objective:  Physical Exam Vitals and nursing note reviewed.  Constitutional:      Appearance: Normal appearance. She is obese.  HENT:     Head: Normocephalic and atraumatic.  Cardiovascular:     Rate and Rhythm: Normal rate and regular rhythm.     Heart sounds: Normal heart sounds.  Pulmonary:     Breath sounds: Normal breath sounds.  Skin:    General: Skin is warm.  Neurological:     General: No focal deficit present.     Mental Status: She is alert and oriented to person, place, and time.         Assessment And Plan:     1. Essential hypertension Comments: Uncontrolled, I think it is exacerbated by tobacco use. She vehemently denies smoking prior to her visit. She does not wish to take add'l meds. Cardiac and cerebrovascular risks associated with elevated bp and smoking were d/w patient in full detail.   2. Uncontrolled type 2 diabetes mellitus with hyperglycemia (HCC) Comments: Chronic. I will check labs as listed below.  Importance of dietary compliance was d/w patient. I will adjust meds as needed. I will check renal function today.  - CMP14+EGFR - Hemoglobin A1c  3. Estrogen deficiency Comments: I will schedule her for bone density. I will try to have scheduled same day as her upcoming mammogram.  - DG Bone Density; Future  4. Cigarette nicotine dependence without complication Comments: She declines low dose CT scan. Importance of smoking cessation was d/w patient. Smoking cessation instruction/counseling given:  counseled patient on the dangers of tobacco use, advised patient to stop smoking, and reviewed strategies to maximize success  5. Urinary frequency Comments: I will check a urinalysis today.  - POCT Urinalysis Dipstick (81002)  6. Immunosuppressed status ([place]) Comments: She is on immunosuppressants as per Liver Transplant clinic.   7. Liver transplant status [facility]) Comments: Her labs will be faxed to [facility] as  requested.    Patient was given opportunity to ask questions. Patient verbalized understanding of the plan and was able to repeat key elements of the plan. All questions were answered to their satisfaction.   I, Maximino Greenland, MD, have reviewed all documentation for this visit. The documentation on 01/14/21 for the exam, diagnosis, procedures, and orders are all accurate and complete.   IF YOU HAVE BEEN REFERRED TO A SPECIALIST, IT MAY TAKE 1-2 WEEKS TO SCHEDULE/PROCESS THE REFERRAL. IF YOU HAVE NOT HEARD FROM US/SPECIALIST IN TWO WEEKS, PLEASE GIVE [place] A CALL AT [phone].   THE PATIENT IS ENCOURAGED TO PRACTICE SOCIAL DISTANCING DUE TO THE COVID-19 PANDEMIC.

## 2021-01-11 LAB — CMP14+EGFR
ALT: 10 IU/L (ref 0–32)
AST: 20 IU/L (ref 0–40)
Albumin/Globulin Ratio: 1.2 (ref 1.2–2.2)
Albumin: 4.1 g/dL (ref 3.8–4.8)
Alkaline Phosphatase: 181 IU/L — ABNORMAL HIGH (ref 44–121)
BUN/Creatinine Ratio: 19 (ref 12–28)
BUN: 13 mg/dL (ref 8–27)
Bilirubin Total: 0.7 mg/dL (ref 0.0–1.2)
CO2: 23 mmol/L (ref 20–29)
Calcium: 9.2 mg/dL (ref 8.7–10.3)
Chloride: 101 mmol/L (ref 96–106)
Creatinine, Ser: 0.7 mg/dL (ref 0.57–1.00)
Globulin, Total: 3.4 g/dL (ref 1.5–4.5)
Glucose: 62 mg/dL — ABNORMAL LOW (ref 65–99)
Potassium: 3.9 mmol/L (ref 3.5–5.2)
Sodium: 138 mmol/L (ref 134–144)
Total Protein: 7.5 g/dL (ref 6.0–8.5)
eGFR: 96 mL/min/{1.73_m2} (ref >59)

## 2021-01-11 LAB — HEMOGLOBIN A1C
Est. average glucose Bld gHb Est-mCnc: 128 mg/dL
Hgb A1c MFr Bld: 6.1 % — ABNORMAL HIGH (ref 4.8–5.6)

## 2021-01-12 ENCOUNTER — Telehealth: Payer: Self-pay

## 2021-01-12 ENCOUNTER — Telehealth: Payer: Medicare Other

## 2021-01-12 NOTE — Telephone Encounter (Signed)
  Chronic Care Management   Outreach Note  01/12/2021 Name: Kathryn Walsh MRN: [id number] DOB: 12-04-1955  Referred by: Glendale Chard, MD Reason for referral : Chronic Care Management (RN CM FU Call Attempt)   An unsuccessful telephone outreach was attempted today. The patient was referred to the case management team for assistance with care management and care coordination.   Follow Up Plan: Telephone follow up appointment with care management team member scheduled for: 02/15/21  Barb Merino, RN, BSN, CCM Care Management Coordinator [place] [facility]  Direct Phone: [phone]

## 2021-02-13 ENCOUNTER — Other Ambulatory Visit: Payer: Self-pay | Admitting: Nurse Practitioner

## 2021-02-13 DIAGNOSIS — G894 Chronic pain syndrome: Secondary | ICD-10-CM

## 2021-02-14 DIAGNOSIS — D849 Immunodeficiency, unspecified: Secondary | ICD-10-CM | POA: Diagnosis not present

## 2021-02-14 DIAGNOSIS — Z944 Liver transplant status: Secondary | ICD-10-CM | POA: Diagnosis not present

## 2021-02-15 ENCOUNTER — Telehealth: Payer: Self-pay

## 2021-02-15 ENCOUNTER — Telehealth: Payer: Medicare Other

## 2021-02-15 NOTE — Chronic Care Management (AMB) (Signed)
Chronic Care Management Pharmacy Assistant   Name: Kathryn Walsh  MRN: [id number] DOB: 1956-07-13   Reason for Encounter: Disease State-Hypertension    Recent office visits:  01/10/21- Glendale Chard, MD (PCP) 10/12/20- Bary Castilla (PCP) 08/25/20- Glendale Chard, MD(PCP)  Recent consult visits:  12/22/20- Teena Irani (Transplant)  Hospital visits:  None in previous 6 months  Medications: Outpatient Encounter Medications as of 02/15/2021  Medication Sig  . amLODipine (NORVASC) 5 MG tablet TAKE 1 TABLET BY MOUTH EVERY DAY  . aspirin 81 MG chewable tablet Chew 81 mg by mouth daily.  [name] azaTHIOprine (IMURAN) 50 MG tablet Take 100 mg by mouth 2 (two) times daily.   . Blood Glucose Monitoring Suppl (ACCU-CHEK GUIDE ME) w/Device KIT Use to check blood sugar 3 times a day. Dx code e11.65  . Cholecalciferol 25 MCG (1000 UT) capsule Take 4,000 Units by mouth daily.  . cyclobenzaprine (FLEXERIL) 10 MG tablet TAKE 1 TABLET BY MOUTH EVERYDAY AT BEDTIME  . glucose blood (ACCU-CHEK GUIDE) test strip Use as instructed to check blood sugar 3 times a day. Dx code e11.65  . insulin degludec (TRESIBA FLEXTOUCH) 200 UNIT/ML FlexTouch Pen Inject 42 Units into the skin. Pt doing 32 units qhs  . Insulin Lispro (HUMALOG KWIKPEN) 200 UNIT/ML SOPN Inject 6 Units into the skin 2 (two) times daily before a meal. Please inject 6 units before breakfast and dinner; max dose titration 40 units daily  . pravastatin (PRAVACHOL) 40 MG tablet TAKE 1 TABLET BY MOUTH EVERYDAY AT BEDTIME  . propranolol (INDERAL) 20 MG tablet Take 20 mg by mouth 2 (two) times daily. Once daily  . tacrolimus (PROGRAF) 1 MG capsule Take 2 mg in am and 3 mg in pm  . Turmeric 500 MG TABS Take by mouth.   No facility-administered encounter medications on file as of 02/15/2021.    Reviewed chart prior to disease state call. Spoke with patient regarding BP  Recent Office Vitals: BP Readings from Last 3 Encounters:  01/10/21  (!) 150/88  10/12/20 (!) 146/84  08/25/20 (!) 150/80   Pulse Readings from Last 3 Encounters:  01/10/21 82  10/12/20 77  08/25/20 81    Wt Readings from Last 3 Encounters:  01/10/21 168 lb 12.8 oz (76.6 kg)  10/12/20 168 lb (76.2 kg)  08/25/20 171 lb 3.2 oz (77.7 kg)     Kidney Function Lab Results  Component Value Date/Time   CREATININE 0.70 01/10/2021 12:03 PM   CREATININE 0.64 10/12/2020 10:57 AM   GFRNONAA 95 10/12/2020 10:57 AM   GFRAA 109 10/12/2020 10:57 AM    BMP Latest Ref Rng & Units 01/10/2021 10/12/2020 04/12/2020  Glucose 65 - 99 mg/dL 62(L) 167(H) 127(H)  BUN 8 - 27 mg/dL _0 Creatinine 0.57 - 1.00 mg/dL 0.70 0.64 0.66  BUN/Creat Ratio 12 - _1 Sodium 134 - 144 mmol/L 138 140 139  Potassium 3.5 - 5.2 mmol/L 3.9 4.2 4.5  Chloride 96 - 106 mmol/L 101 104 102  CO2 20 - 29 mmol/L _2 Calcium 8.7 - 10.3 mg/dL 9.2 8.9 9.2    . Current antihypertensive regimen:  o Amlodipine 5 mg Take 1 tablet daily   . How often are you checking your Blood Pressure?   . Current home BP readings:   . What recent interventions/DTPs have been made by any provider to improve Blood Pressure control since last CPP Visit:  o None noted  . Any recent  hospitalizations or ED visits since last visit with CPP? No   . What diet changes have been made to improve Blood Pressure Control?  [name] What exercise is being done to improve your Blood Pressure Control?    Adherence Review: Is the patient currently on ACE/ARB medication? No Does the patient have >5 day gap between last estimated fill dates? No    Star Rating Drugs: Pravastatin 40 mg last filled 12/12/20 90DS   Third attempt made to reach patient. Unable to reach patient. NO voicemail available.  Lizbeth Bark Clinical Pharmacist Assistant [phone]

## 2021-02-15 NOTE — Telephone Encounter (Signed)
  Chronic Care Management   Outreach Note  02/15/2021 Name: Kathryn Walsh MRN: [id number] DOB: 1956-04-07  Referred by: Glendale Chard, MD Reason for referral : Chronic Care Management (RN CM FU Call Attempt)   An unsuccessful telephone outreach was attempted today. The patient was referred to the case management team for assistance with care management and care coordination.   Follow Up Plan: Telephone follow up appointment with care management team member scheduled for: 04/27/21  Barb Merino, RN, BSN, CCM Care Management Coordinator [place] [facility]  Direct Phone: [phone]

## 2021-02-16 ENCOUNTER — Ambulatory Visit
Admission: RE | Admit: 2021-02-16 | Discharge: 2021-02-16 | Disposition: A | Discharge status: Home / Self Care | Payer: Medicare Other | Source: Ambulatory Visit | Attending: Internal Medicine | Admitting: Internal Medicine

## 2021-02-16 ENCOUNTER — Other Ambulatory Visit: Payer: Self-pay

## 2021-02-16 DIAGNOSIS — Z1231 Encounter for screening mammogram for malignant neoplasm of breast: Secondary | ICD-10-CM

## 2021-02-28 ENCOUNTER — Telehealth: Payer: Self-pay

## 2021-02-28 NOTE — Chronic Care Management (AMB) (Signed)
    Chronic Care Management Pharmacy Assistant   Name: Kathryn Walsh  MRN: [id number] DOB: 05-16-1956  Reason for Encounter: Patient Assistance Coordination   02/28/2021- Patient assistance reorder application filled out for [place] with [employer] patient assistance program, printed for Dr Baird Cancer to sign, then will fax.  [name], CPP faxed forms.  Medications: Outpatient Encounter Medications as of 02/28/2021  Medication Sig  . amLODipine (NORVASC) 5 MG tablet TAKE 1 TABLET BY MOUTH EVERY DAY  . aspirin 81 MG chewable tablet Chew 81 mg by mouth daily.  [name] azaTHIOprine (IMURAN) 50 MG tablet Take 100 mg by mouth 2 (two) times daily.   . Blood Glucose Monitoring Suppl (ACCU-CHEK GUIDE ME) w/Device KIT Use to check blood sugar 3 times a day. Dx code e11.65  . Cholecalciferol 25 MCG (1000 UT) capsule Take 4,000 Units by mouth daily.  . cyclobenzaprine (FLEXERIL) 10 MG tablet TAKE 1 TABLET BY MOUTH EVERYDAY AT BEDTIME  . glucose blood (ACCU-CHEK GUIDE) test strip Use as instructed to check blood sugar 3 times a day. Dx code e11.65  . insulin degludec (TRESIBA FLEXTOUCH) 200 UNIT/ML FlexTouch Pen Inject 42 Units into the skin. Pt doing 32 units qhs  . Insulin Lispro (HUMALOG KWIKPEN) 200 UNIT/ML SOPN Inject 6 Units into the skin 2 (two) times daily before a meal. Please inject 6 units before breakfast and dinner; max dose titration 40 units daily  . pravastatin (PRAVACHOL) 40 MG tablet TAKE 1 TABLET BY MOUTH EVERYDAY AT BEDTIME  . propranolol (INDERAL) 20 MG tablet Take 20 mg by mouth 2 (two) times daily. Once daily  . tacrolimus (PROGRAF) 1 MG capsule Take 2 mg in am and 3 mg in pm  . Turmeric 500 MG TABS Take by mouth.   No facility-administered encounter medications on file as of 02/28/2021.    Star Rating Drugs: Pravastatin 40 mg- Last filled 12/12/2020 for 90 day supply at [facility].   SIG: Pattricia Boss, [place] [phone]

## 2021-03-10 ENCOUNTER — Other Ambulatory Visit: Payer: Self-pay | Admitting: Internal Medicine

## 2021-03-25 ENCOUNTER — Other Ambulatory Visit: Payer: Self-pay | Admitting: Nurse Practitioner

## 2021-03-25 DIAGNOSIS — G894 Chronic pain syndrome: Secondary | ICD-10-CM

## 2021-03-31 ENCOUNTER — Other Ambulatory Visit: Payer: Self-pay | Admitting: Internal Medicine

## 2021-04-04 ENCOUNTER — Encounter: Payer: Self-pay | Admitting: Internal Medicine

## 2021-04-04 ENCOUNTER — Other Ambulatory Visit: Payer: Self-pay

## 2021-04-04 ENCOUNTER — Ambulatory Visit (INDEPENDENT_AMBULATORY_CARE_PROVIDER_SITE_OTHER): Payer: Medicare Other | Admitting: Internal Medicine

## 2021-04-04 VITALS — BP 116/78 | HR 75 | Temp 98.2°F | Ht 67.2 in | Wt 170.6 lb

## 2021-04-04 DIAGNOSIS — M5431 Sciatica, right side: Secondary | ICD-10-CM | POA: Diagnosis not present

## 2021-04-04 DIAGNOSIS — M4184 Other forms of scoliosis, thoracic region: Secondary | ICD-10-CM

## 2021-04-04 NOTE — Progress Notes (Signed)
I,Katawbba Wiggins,acting as a Education administrator for Maximino Greenland, MD.,have documented all relevant documentation on the behalf of Maximino Greenland, MD,as directed by  Maximino Greenland, MD while in the presence of Maximino Greenland, MD.  This visit occurred during the SARS-CoV-2 public health emergency.  Safety protocols were in place, including screening questions prior to the visit, additional usage of staff PPE, and extensive cleaning of exam room while observing appropriate contact time as indicated for disinfecting solutions.  Subjective:     Patient ID: Kathryn Walsh , female    DOB: 1956-01-18 , 65 y.o.   MRN: [id number]   Chief Complaint  Patient presents with   Sciatica    HPI  The patient is here for a follow-up on back pain. She has h/o scoliosis and has occasional flares. States she is unable to sit for long periods of time due to this condition. Denies LE weakness/paresthesias.   Back Pain This is a recurrent problem. The current episode started more than 1 year ago. The problem occurs intermittently. The problem has been waxing and waning since onset. The pain is present in the lumbar spine. The quality of the pain is described as aching. The pain does not radiate. The pain is at a severity of 5/10. The pain is moderate. The symptoms are aggravated by bending. Pertinent negatives include no abdominal pain, bladder incontinence or numbness. Risk factors include menopause. The treatment provided moderate relief.    Past Medical History:  Diagnosis Date   Diabetes mellitus without complication (HCC)    High cholesterol    HTN (hypertension)    Liver transplant recipient [facility])      Family History  Problem Relation Age of Onset   Healthy Mother    Healthy Father      Current Outpatient Medications:    amLODipine (NORVASC) 5 MG tablet, TAKE 1 TABLET BY MOUTH EVERY DAY, Disp: 90 tablet, Rfl: 2   aspirin 81 MG chewable tablet, Chew 81 mg by mouth daily., Disp: , Rfl:     azaTHIOprine (IMURAN) 50 MG tablet, Take 100 mg by mouth 2 (two) times daily. , Disp: , Rfl:    Blood Glucose Monitoring Suppl (ACCU-CHEK GUIDE ME) w/Device KIT, Use to check blood sugar 3 times a day. Dx code e11.65, Disp: 1 kit, Rfl: 3   cyclobenzaprine (FLEXERIL) 10 MG tablet, TAKE 1 TABLET BY MOUTH EVERYDAY AT BEDTIME, Disp: 30 tablet, Rfl: 0   insulin degludec (TRESIBA FLEXTOUCH) 200 UNIT/ML FlexTouch Pen, Inject 42 Units into the skin. Pt doing 32 units qhs, Disp: , Rfl:    Insulin Lispro (HUMALOG KWIKPEN) 200 UNIT/ML SOPN, Inject 6 Units into the skin 2 (two) times daily before a meal. Please inject 6 units before breakfast and dinner; max dose titration 40 units daily, Disp: 5 pen, Rfl: 5   pravastatin (PRAVACHOL) 40 MG tablet, TAKE 1 TABLET BY MOUTH EVERYDAY AT BEDTIME, Disp: 90 tablet, Rfl: 1   propranolol (INDERAL) 20 MG tablet, Take 20 mg by mouth 2 (two) times daily. Once daily, Disp: , Rfl:    tacrolimus (PROGRAF) 1 MG capsule, Take 2 mg in am and 3 mg in pm, Disp: , Rfl:    Turmeric 500 MG TABS, Take by mouth., Disp: , Rfl:    Cholecalciferol 25 MCG (1000 UT) capsule, Take 4,000 Units by mouth daily. (Patient not taking: Reported on 04/04/2021), Disp: , Rfl:    glucose blood (ACCU-CHEK GUIDE) test strip, Use as instructed to check blood sugar 3  times a day. Dx code e11.65, Disp: 100 each, Rfl: 12   No Known Allergies   Review of Systems  Constitutional: Negative.   Respiratory: Negative.    Cardiovascular: Negative.   Gastrointestinal: Negative.  Negative for abdominal pain.  Genitourinary:  Negative for bladder incontinence.  Musculoskeletal:  Positive for back pain.  Neurological: Negative.  Negative for numbness.  Psychiatric/Behavioral: Negative.      Today's Vitals   04/04/21 1008  BP: 116/78  Pulse: 75  Temp: 98.2 F (36.8 C)  TempSrc: Oral  Weight: 170 lb 9.6 oz (77.4 kg)  Height: 5' 7.2" (1.707 m)  PainSc: 8   PainLoc: Back   Body mass index is 26.56 kg/m.   Wt Readings from Last 3 Encounters:  04/20/21 172 lb (78 kg)  04/04/21 170 lb 9.6 oz (77.4 kg)  01/10/21 168 lb 12.8 oz (76.6 kg)   BP Readings from Last 3 Encounters:  04/20/21 134/82  04/04/21 116/78  01/10/21 (!) 150/88   Objective:  Physical Exam Vitals and nursing note reviewed.  Constitutional:      Appearance: Normal appearance.  HENT:     Head: Normocephalic and atraumatic.  Cardiovascular:     Rate and Rhythm: Normal rate and regular rhythm.     Heart sounds: Normal heart sounds.  Pulmonary:     Effort: Pulmonary effort is normal.     Breath sounds: Normal breath sounds.  Musculoskeletal:     Lumbar back: Scoliosis present.  Skin:    General: Skin is warm.  Neurological:     General: No focal deficit present.     Mental Status: She is alert.  Psychiatric:        Mood and Affect: Mood normal.        Behavior: Behavior normal.        Assessment And Plan:     1. Sciatica of right side Comments: She was given several stretching exercises to perform daily, including Figure 4. IF persistent, I will refer her to PT for strengthening exercises.   2. Dextroscoliosis of thoracic spine Comments: Chronic, seen on CXR performed at [facility] 2018. Due to flares, she is unable to participate in jury duty at this time.    Patient was given opportunity to ask questions. Patient verbalized understanding of the plan and was able to repeat key elements of the plan. All questions were answered to their satisfaction.   I, Maximino Greenland, MD, have reviewed all documentation for this visit. The documentation on 04/23/21 for the exam, diagnosis, procedures, and orders are all accurate and complete.   IF YOU HAVE BEEN REFERRED TO A SPECIALIST, IT MAY TAKE 1-2 WEEKS TO SCHEDULE/PROCESS THE REFERRAL. IF YOU HAVE NOT HEARD FROM US/SPECIALIST IN TWO WEEKS, PLEASE GIVE [place] A CALL AT [phone] X [phone].   THE PATIENT IS ENCOURAGED TO PRACTICE SOCIAL DISTANCING DUE TO THE COVID-19 PANDEMIC.

## 2021-04-04 NOTE — Patient Instructions (Signed)
Sciatica  Sciatica is pain, weakness, tingling, or loss of feeling (numbness) along the sciatic nerve. The sciatic nerve starts in the lower back and goes down the back of each leg. Sciatica usually goes away on its own or with treatment. Sometimes, sciatica may come back (recur). What are the causes? This condition happens when the sciatic nerve is pinched or has pressure put on it. This may be the result of:  A disk in between the bones of the spine bulging out too far (herniated disk).  Changes in the spinal disks that occur with aging.  A condition that affects a muscle in the butt.  Extra bone growth near the sciatic nerve.  A break (fracture) of the area between your hip bones (pelvis).  Pregnancy.  Tumor. This is rare. What increases the risk? You are more likely to develop this condition if you:  Play sports that put pressure or stress on the spine.  Have poor strength and ease of movement (flexibility).  Have had a back injury in the past.  Have had back surgery.  Sit for long periods of time.  Do activities that involve bending or lifting over and over again.  Are very overweight (obese). What are the signs or symptoms? Symptoms can vary from mild to very bad. They may include:  Any of these problems in the lower back, leg, hip, or butt: ? Mild tingling, loss of feeling, or dull aches. ? Burning sensations. ? Sharp pains.  Loss of feeling in the back of the calf or the sole of the foot.  Leg weakness.  Very bad back pain that makes it hard to move. These symptoms may get worse when you cough, sneeze, or laugh. They may also get worse when you sit or stand for long periods of time. How is this treated? This condition often gets better without any treatment. However, treatment may include:  Changing or cutting back on physical activity when you have pain.  Doing exercises and stretching.  Putting ice or heat on the affected area.  Medicines that  help: ? To relieve pain and swelling. ? To relax your muscles.  Shots (injections) of medicines that help to relieve pain, irritation, and swelling.  Surgery. Follow these instructions at home: Medicines  Take over-the-counter and prescription medicines only as told by your doctor.  Ask your doctor if the medicine prescribed to you: ? Requires you to avoid driving or using heavy machinery. ? Can cause trouble pooping (constipation). You may need to take these steps to prevent or treat trouble pooping:  Drink enough fluids to keep your pee (urine) pale yellow.  Take over-the-counter or prescription medicines.  Eat foods that are high in fiber. These include beans, whole grains, and fresh fruits and vegetables.  Limit foods that are high in fat and sugar. These include fried or sweet foods. Managing pain  If told, put ice on the affected area. ? Put ice in a plastic bag. ? Place a towel between your skin and the bag. ? Leave the ice on for 20 minutes, 2-3 times a day.  If told, put heat on the affected area. Use the heat source that your doctor tells you to use, such as a moist heat pack or a heating pad. ? Place a towel between your skin and the heat source. ? Leave the heat on for 20-30 minutes. ? Remove the heat if your skin turns bright red. This is very important if you are unable to feel pain,   heat, or cold. You may have a greater risk of getting burned.      Activity  Return to your normal activities as told by your doctor. Ask your doctor what activities are safe for you.  Avoid activities that make your symptoms worse.  Take short rests during the day. ? When you rest for a long time, do some physical activity or stretching between periods of rest. ? Avoid sitting for a long time without moving. Get up and move around at least one time each hour.  Exercise and stretch regularly, as told by your doctor.  Do not lift anything that is heavier than 10 lb (4.5 kg)  while you have symptoms of sciatica. ? Avoid lifting heavy things even when you do not have symptoms. ? Avoid lifting heavy things over and over.  When you lift objects, always lift in a way that is safe for your body. To do this, you should: ? Bend your knees. ? Keep the object close to your body. ? Avoid twisting.   General instructions  Stay at a healthy weight.  Wear comfortable shoes that support your feet. Avoid wearing high heels.  Avoid sleeping on a mattress that is too soft or too hard. You might have less pain if you sleep on a mattress that is firm enough to support your back.  Keep all follow-up visits as told by your doctor. This is important. Contact a doctor if:  You have pain that: ? Wakes you up when you are sleeping. ? Gets worse when you lie down. ? Is worse than the pain you have had in the past. ? Lasts longer than 4 weeks.  You lose weight without trying. Get help right away if:  You cannot control when you pee (urinate) or poop (have a bowel movement).  You have weakness in any of these areas and it gets worse: ? Lower back. ? The area between your hip bones. ? Butt. ? Legs.  You have redness or swelling of your back.  You have a burning feeling when you pee. Summary  Sciatica is pain, weakness, tingling, or loss of feeling (numbness) along the sciatic nerve.  This condition happens when the sciatic nerve is pinched or has pressure put on it.  Sciatica can cause pain, tingling, or loss of feeling (numbness) in the lower back, legs, hips, and butt.  Treatment often includes rest, exercise, medicines, and putting ice or heat on the affected area. This information is not intended to replace advice given to you by your health care provider. Make sure you discuss any questions you have with your health care provider. Document Revised: 11/03/2018 Document Reviewed: 11/03/2018 Elsevier Patient Education  2021 [employer] Inc.  

## 2021-04-05 ENCOUNTER — Encounter: Payer: Self-pay | Admitting: Internal Medicine

## 2021-04-10 DIAGNOSIS — D849 Immunodeficiency, unspecified: Secondary | ICD-10-CM | POA: Diagnosis not present

## 2021-04-10 DIAGNOSIS — Z944 Liver transplant status: Secondary | ICD-10-CM | POA: Diagnosis not present

## 2021-04-11 ENCOUNTER — Telehealth: Payer: Self-pay

## 2021-04-11 NOTE — Chronic Care Management (AMB) (Signed)
Chronic Care Management Pharmacy Assistant   Name: Kathryn Walsh  MRN: [id number] DOB: 1955-11-04   Reason for Encounter: Disease State/ Hypertension   Recent office visits:  12-19-2020 Daneen Schick (CCM)  01-10-2021 Glendale Chard, MD   Recent consult visits:  12-22-2020 Verita Schneiders, MD ([facility])  Hospital visits:  None in previous 6 months  Medications: Outpatient Encounter Medications as of 04/11/2021  Medication Sig   amLODipine (NORVASC) 5 MG tablet TAKE 1 TABLET BY MOUTH EVERY DAY   aspirin 81 MG chewable tablet Chew 81 mg by mouth daily.   azaTHIOprine (IMURAN) 50 MG tablet Take 100 mg by mouth 2 (two) times daily.    Blood Glucose Monitoring Suppl (ACCU-CHEK GUIDE ME) w/Device KIT Use to check blood sugar 3 times a day. 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 (Patient not taking: Reported on 04/04/2021)   cyclobenzaprine (FLEXERIL) 10 MG tablet TAKE 1 TABLET BY MOUTH EVERYDAY AT BEDTIME   glucose blood (ACCU-CHEK GUIDE) test strip Use as instructed to check blood sugar 3 times a day. 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 Pt doing 32 units qhs   Insulin Lispro (HUMALOG KWIKPEN) 200 UNIT/ML SOPN Inject 6 Units into the skin 2 (two) times daily before a meal. Please inject 6 units before breakfast and dinner; max dose titration 40 units daily   pravastatin (PRAVACHOL) 40 MG tablet TAKE 1 TABLET BY MOUTH EVERYDAY AT BEDTIME   propranolol (INDERAL) 20 MG tablet Take 20 mg by mouth 2 (two) times daily. Once daily   tacrolimus (PROGRAF) 1 MG capsule Take 2 mg in am and 3 mg in pm   Turmeric 500 MG TABS Take by mouth.   No facility-administered encounter medications on file as of 04/11/2021.   Reviewed chart prior to disease state call. Spoke with patient regarding BP  Recent Office Vitals: BP Readings from Last 3 Encounters:   04/04/21 116/78  01/10/21 (!) 150/88  10/12/20 (!) 146/84   Pulse Readings from Last 3 Encounters:  04/04/21 75  01/10/21 82  10/12/20 77    Wt Readings from Last 3 Encounters:  04/04/21 170 lb 9.6 oz (77.4 kg)  01/10/21 168 lb 12.8 oz (76.6 kg)  10/12/20 168 lb (76.2 kg)     Kidney Function Lab Results  Component Value Date/Time   CREATININE 0.70 01/10/2021 12:03 PM   CREATININE 0.64 10/12/2020 10:57 AM   GFRNONAA 95 10/12/2020 10:57 AM   GFRAA 109 10/12/2020 10:57 AM    BMP Latest Ref Rng & Units 01/10/2021 10/12/2020 04/12/2020  Glucose 65 - 99 mg/dL 62(L) 167(H) 127(H)  BUN 8 - 27 mg/dL '13 12 9  ' Creatinine 0.57 - 1.00 mg/dL 0.70 0.64 0.66  BUN/Creat Ratio 12 - '28 19 19 14  ' Sodium 134 - 144 mmol/L 138 140 139  Potassium 3.5 - 5.2 mmol/L 3.9 4.2 4.5  Chloride 96 - 106 mmol/L 101 104 102  CO2 20 - 29 mmol/L '23 23 24  ' Calcium 8.7 - 10.3 mg/dL 9.2 8.9 9.2    Current antihypertensive regimen:  Propranolol 20 mg twice daily Amlodipine 5 mg daily  How often are you checking your Blood Pressure? Patient states she doesn't check blood pressure at home.  Current home BP readings: None  What recent interventions/DTPs have been made by any provider to improve Blood Pressure control since last CPP  Visit: Patient states she takes medications as directed.   Any recent hospitalizations or ED visits since last visit with CPP? No  What diet changes have been made to improve Blood Pressure Control?  Patient states she limits her salt intake and eats a lot of fresh vegetables. Patient's daughter has moved with her and has patient on a healthy diet.  What exercise is being done to improve your Blood Pressure Control?  Patient states she works 2 days out the week for a few hours and is outside moving around a lot.  Adherence Review: Is the patient currently on ACE/ARB medication? No Does the patient have >5 day gap between last estimated fill dates? No  NOTES: Patient states  she is doing well managing her blood pressure. Follow up visit with [name] CPP on 05-24-2021 at 9:00 sent to scheduling.  Star Rating Drugs: Pravastatin 40 mg- Last filled 03-10-2021 [facility].  [place] Clinical Pharmacist Assistant [phone]

## 2021-04-20 ENCOUNTER — Encounter: Payer: Self-pay | Admitting: Internal Medicine

## 2021-04-20 ENCOUNTER — Other Ambulatory Visit: Payer: Self-pay

## 2021-04-20 ENCOUNTER — Telehealth: Payer: Self-pay

## 2021-04-20 ENCOUNTER — Ambulatory Visit (INDEPENDENT_AMBULATORY_CARE_PROVIDER_SITE_OTHER): Payer: Medicare Other | Admitting: Internal Medicine

## 2021-04-20 VITALS — BP 134/82 | HR 77 | Temp 98.1°F | Ht 67.4 in | Wt 172.0 lb

## 2021-04-20 DIAGNOSIS — Z0000 Encounter for general adult medical examination without abnormal findings: Secondary | ICD-10-CM | POA: Diagnosis not present

## 2021-04-20 DIAGNOSIS — E1165 Type 2 diabetes mellitus with hyperglycemia: Secondary | ICD-10-CM

## 2021-04-20 DIAGNOSIS — I10 Essential (primary) hypertension: Secondary | ICD-10-CM | POA: Diagnosis not present

## 2021-04-20 DIAGNOSIS — Z23 Encounter for immunization: Secondary | ICD-10-CM | POA: Diagnosis not present

## 2021-04-20 DIAGNOSIS — F17210 Nicotine dependence, cigarettes, uncomplicated: Secondary | ICD-10-CM

## 2021-04-20 LAB — POCT URINALYSIS DIPSTICK
Glucose, UA: NEGATIVE
Ketones, UA: NEGATIVE
Leukocytes, UA: NEGATIVE
Nitrite, UA: NEGATIVE
Protein, UA: POSITIVE — AB
Spec Grav, UA: >=1.03 — AB (ref 1.010–1.025)
Urobilinogen, UA: 2 E.U./dL — AB
pH, UA: 5.5 (ref 5.0–8.0)

## 2021-04-20 LAB — POCT UA - MICROALBUMIN
Creatinine, POC: 300 mg/dL
Microalbumin Ur, POC: 150 mg/L

## 2021-04-20 NOTE — Patient Instructions (Signed)
Health Maintenance, Female Adopting a healthy lifestyle and getting preventive care are important in promoting health and wellness. Ask your health care provider about: The right schedule for you to have regular tests and exams. Things you can do on your own to prevent diseases and keep yourself healthy. What should I know about diet, weight, and exercise? Eat a healthy diet  Eat a diet that includes plenty of vegetables, fruits, low-fat dairy products, and lean protein. Do not eat a lot of foods that are high in solid fats, added sugars, or sodium.  Maintain a healthy weight Body mass index (BMI) is used to identify weight problems. It estimates body fat based on height and weight. Your health care provider can help determineyour BMI and help you achieve or maintain a healthy weight. Get regular exercise Get regular exercise. This is one of the most important things you can do for your health. Most adults should: Exercise for at least 150 minutes each week. The exercise should increase your heart rate and make you sweat (moderate-intensity exercise). Do strengthening exercises at least twice a week. This is in addition to the moderate-intensity exercise. Spend less time sitting. Even light physical activity can be beneficial. Watch cholesterol and blood lipids Have your blood tested for lipids and cholesterol at 65 years of age, then havethis test every 5 years. Have your cholesterol levels checked more often if: Your lipid or cholesterol levels are high. You are older than 65 years of age. You are at high risk for heart disease. What should I know about cancer screening? Depending on your health history and family history, you may need to have cancer screening at various ages. This may include screening for: Breast cancer. Cervical cancer. Colorectal cancer. Skin cancer. Lung cancer. What should I know about heart disease, diabetes, and high blood pressure? Blood pressure and heart  disease High blood pressure causes heart disease and increases the risk of stroke. This is more likely to develop in people who have high blood pressure readings, are of African descent, or are overweight. Have your blood pressure checked: Every 3-5 years if you are 18-39 years of age. Every year if you are 40 years old or older. Diabetes Have regular diabetes screenings. This checks your fasting blood sugar level. Have the screening done: Once every three years after age 40 if you are at a normal weight and have a low risk for diabetes. More often and at a younger age if you are overweight or have a high risk for diabetes. What should I know about preventing infection? Hepatitis B If you have a higher risk for hepatitis B, you should be screened for this virus. Talk with your health care provider to find out if you are at risk forhepatitis B infection. Hepatitis C Testing is recommended for: Everyone born from 1945 through 1965. Anyone with known risk factors for hepatitis C. Sexually transmitted infections (STIs) Get screened for STIs, including gonorrhea and chlamydia, if: You are sexually active and are younger than 65 years of age. You are older than 65 years of age and your health care provider tells you that you are at risk for this type of infection. Your sexual activity has changed since you were last screened, and you are at increased risk for chlamydia or gonorrhea. Ask your health care provider if you are at risk. Ask your health care provider about whether you are at high risk for HIV. Your health care provider may recommend a prescription medicine to help   prevent HIV infection. If you choose to take medicine to prevent HIV, you should first get tested for HIV. You should then be tested every 3 months for as long as you are taking the medicine. Pregnancy If you are about to stop having your period (premenopausal) and you may become pregnant, seek counseling before you get  pregnant. Take 400 to 800 micrograms (mcg) of folic acid every day if you become pregnant. Ask for birth control (contraception) if you want to prevent pregnancy. Osteoporosis and menopause Osteoporosis is a disease in which the bones lose minerals and strength with aging. This can result in bone fractures. If you are 65 years old or older, or if you are at risk for osteoporosis and fractures, ask your health care provider if you should: Be screened for bone loss. Take a calcium or vitamin D supplement to lower your risk of fractures. Be given hormone replacement therapy (HRT) to treat symptoms of menopause. Follow these instructions at home: Lifestyle Do not use any products that contain nicotine or tobacco, such as cigarettes, e-cigarettes, and chewing tobacco. If you need help quitting, ask your health care provider. Do not use street drugs. Do not share needles. Ask your health care provider for help if you need support or information about quitting drugs. Alcohol use Do not drink alcohol if: Your health care provider tells you not to drink. You are pregnant, may be pregnant, or are planning to become pregnant. If you drink alcohol: Limit how much you use to 0-1 drink a day. Limit intake if you are breastfeeding. Be aware of how much alcohol is in your drink. In the [place] one drink equals one 12 oz bottle of beer (355 mL), one 5 oz glass of wine (148 mL), or one 1 oz glass of hard liquor (44 mL). General instructions Schedule regular health, dental, and eye exams. Stay current with your vaccines. Tell your health care provider if: You often feel depressed. You have ever been abused or do not feel safe at home. Summary Adopting a healthy lifestyle and getting preventive care are important in promoting health and wellness. Follow your health care provider's instructions about healthy diet, exercising, and getting tested or screened for diseases. Follow your health care provider's  instructions on monitoring your cholesterol and blood pressure. This information is not intended to replace advice given to you by your health care provider. Make sure you discuss any questions you have with your healthcare provider. Document Revised: 10/08/2018 Document Reviewed: 10/08/2018 Elsevier Patient Education  2022 [employer] Inc.  

## 2021-04-20 NOTE — Telephone Encounter (Signed)
The pt was notified that her urine had blood in it and dr sanders wants to recheck it in 2 wks.

## 2021-04-20 NOTE — Progress Notes (Signed)
I,Katawbba Wiggins,acting as a Education administrator for Maximino Greenland, MD.,have documented all relevant documentation on the behalf of Maximino Greenland, MD,as directed by  Maximino Greenland, MD while in the presence of Maximino Greenland, MD.  This visit occurred during the SARS-CoV-2 public health emergency.  Safety protocols were in place, including screening questions prior to the visit, additional usage of staff PPE, and extensive cleaning of exam room while observing appropriate contact time as indicated for disinfecting solutions.  Subjective:     Patient ID: Kathryn Walsh , female    DOB: [birth year] , 65 y.o.   MRN: [id number]   Chief Complaint  Patient presents with   Annual Exam   Diabetes   Hypertension    HPI  She is here today for a full physical exam. She is no longer followed by GYN.  She has no specific concerns or complaints at this time.   Diabetes She presents for her follow-up diabetic visit. She has type 2 diabetes mellitus. Her disease course has been worsening. There are no hypoglycemic associated symptoms. Pertinent negatives for diabetes include no blurred vision and no chest pain. There are no hypoglycemic complications. Risk factors for coronary artery disease include diabetes mellitus, dyslipidemia, hypertension and sedentary lifestyle. Her weight is decreasing steadily. She is following a diabetic diet. She participates in exercise intermittently. Her breakfast blood glucose is taken between 9-10 am. Her breakfast blood glucose range is generally 140-180 mg/dl.  Hypertension This is a chronic problem. The current episode started more than 1 year ago. The problem has been gradually improving since onset. The problem is controlled. Pertinent negatives include no blurred vision, chest pain, palpitations or shortness of breath. Risk factors for coronary artery disease include diabetes mellitus, dyslipidemia and post-menopausal state.    Past Medical History:  Diagnosis Date    Diabetes mellitus without complication (HCC)    High cholesterol    HTN (hypertension)    Liver transplant recipient [facility])      Family History  Problem Relation Age of Onset   Healthy Mother    Healthy Father      Current Outpatient Medications:    amLODipine (NORVASC) 5 MG tablet, TAKE 1 TABLET BY MOUTH EVERY DAY, Disp: 90 tablet, Rfl: 2   aspirin 81 MG chewable tablet, Chew 81 mg by mouth daily., Disp: , Rfl:    azaTHIOprine (IMURAN) 50 MG tablet, Take 100 mg by mouth 2 (two) times daily. , Disp: , Rfl:    Blood Glucose Monitoring Suppl (ACCU-CHEK GUIDE ME) w/Device KIT, Use to check blood sugar 3 times a day. Dx code e11.65, Disp: 1 kit, Rfl: 3   Cholecalciferol 25 MCG (1000 UT) capsule, Take 4,000 Units by mouth daily. (Patient not taking: Reported on 04/04/2021), Disp: , Rfl:    cyclobenzaprine (FLEXERIL) 10 MG tablet, TAKE 1 TABLET BY MOUTH EVERYDAY AT BEDTIME, Disp: 30 tablet, Rfl: 0   glucose blood (ACCU-CHEK GUIDE) test strip, Use as instructed to check blood sugar 3 times a day. Dx code e11.65, Disp: 100 each, Rfl: 12   insulin degludec (TRESIBA FLEXTOUCH) 200 UNIT/ML FlexTouch Pen, Inject 42 Units into the skin. Pt doing 32 units qhs, Disp: , Rfl:    Insulin Lispro (HUMALOG KWIKPEN) 200 UNIT/ML SOPN, Inject 6 Units into the skin 2 (two) times daily before a meal. Please inject 6 units before breakfast and dinner; max dose titration 40 units daily, Disp: 5 pen, Rfl: 5   pravastatin (PRAVACHOL) 40 MG tablet, TAKE 1 TABLET  BY MOUTH EVERYDAY AT BEDTIME, Disp: 90 tablet, Rfl: 1   propranolol (INDERAL) 20 MG tablet, Take 20 mg by mouth 2 (two) times daily. Once daily, Disp: , Rfl:    tacrolimus (PROGRAF) 1 MG capsule, Take 2 mg in am and 3 mg in pm, Disp: , Rfl:    Turmeric 500 MG TABS, Take by mouth., Disp: , Rfl:    No Known Allergies   The patient states she uses post menopausal status for birth control. Last LMP was No LMP recorded. Patient is postmenopausal.. Negative for  Dysmenorrhea. Negative for: breast discharge, breast lump(s), breast pain and breast self exam. Associated symptoms include abnormal vaginal bleeding. Pertinent negatives include abnormal bleeding (hematology), anxiety, decreased libido, depression, difficulty falling sleep, dyspareunia, history of infertility, nocturia, sexual dysfunction, sleep disturbances, urinary incontinence, urinary urgency, vaginal discharge and vaginal itching. Diet regular.The patient states her exercise level is  intermittent.  . The patient's tobacco use is:  Social History   Tobacco Use  Smoking Status Some Days   Packs/day: 0.75   Years: 40.00   Pack years: 30.00   Types: Cigarettes   Start date: 11/12/1974  Smokeless Tobacco Never  Tobacco Comments   encouraged to avoid smoking in the house and in her car  . She has been exposed to passive smoke. The patient's alcohol use is:  Social History   Substance and Sexual Activity  Alcohol Use No    Review of Systems  Constitutional: Negative.   HENT: Negative.    Eyes: Negative.  Negative for blurred vision.  Respiratory: Negative.  Negative for shortness of breath.   Cardiovascular: Negative.  Negative for chest pain and palpitations.  Gastrointestinal: Negative.   Endocrine: Negative.   Genitourinary: Negative.   Musculoskeletal: Negative.   Skin: Negative.   Allergic/Immunologic: Negative.   Neurological: Negative.   Hematological: Negative.   Psychiatric/Behavioral: Negative.      Today's Vitals   04/20/21 0926  BP: 134/82  Pulse: 77  Temp: 98.1 F (36.7 C)  TempSrc: Oral  Weight: 172 lb (78 kg)  Height: 5' 7.4" (1.712 m)  PainSc: 0-No pain   Body mass index is 26.62 kg/m.  Wt Readings from Last 3 Encounters:  04/20/21 172 lb (78 kg)  04/04/21 170 lb 9.6 oz (77.4 kg)  01/10/21 168 lb 12.8 oz (76.6 kg)    BP Readings from Last 3 Encounters:  04/20/21 134/82  04/04/21 116/78  01/10/21 (!) 150/88    Objective:  Physical  Exam Vitals and nursing note reviewed.  Constitutional:      Appearance: Normal appearance.  HENT:     Head: Normocephalic and atraumatic.     Right Ear: Tympanic membrane, ear canal and external ear normal.     Left Ear: Tympanic membrane, ear canal and external ear normal.     Nose:     Comments: Masked     Mouth/Throat:     Comments: Masked  Eyes:     Extraocular Movements: Extraocular movements intact.     Conjunctiva/sclera: Conjunctivae normal.     Pupils: Pupils are equal, round, and reactive to light.  Cardiovascular:     Rate and Rhythm: Normal rate and regular rhythm.     Pulses: Normal pulses.          Dorsalis pedis pulses are 2+ on the right side and 2+ on the left side.     Heart sounds: Normal heart sounds.  Pulmonary:     Effort: Pulmonary effort is normal.  Breath sounds: Normal breath sounds.  Chest:  Breasts:    Tanner Score is 5.     Right: Normal.     Left: Normal.  Abdominal:     General: Bowel sounds are normal.     Palpations: Abdomen is soft.     Comments: Healed surgical scar  Genitourinary:    Comments: deferred Musculoskeletal:        General: Normal range of motion.     Cervical back: Normal range of motion and neck supple.  Feet:     Right foot:     Protective Sensation: 5 sites tested.  5 sites sensed.     Skin integrity: Dry skin present.     Toenail Condition: Right toenails are long.     Left foot:     Protective Sensation: 5 sites tested.  5 sites sensed.     Skin integrity: Dry skin present.     Toenail Condition: Left toenails are long.  Skin:    General: Skin is warm and dry.  Neurological:     General: No focal deficit present.     Mental Status: She is alert and oriented to person, place, and time.  Psychiatric:        Mood and Affect: Mood normal.        Behavior: Behavior normal.        Assessment And Plan:     1. Routine general medical examination at health care facility Comments: A full exam was performed.  Importance of monthly self breast exams was discussed with the patient.  PATIENT IS ADVISED TO GET 30-45 MINUTES REGULAR EXERCISE NO LESS THAN FOUR TO FIVE DAYS PER WEEK - BOTH WEIGHTBEARING EXERCISES AND AEROBIC ARE RECOMMENDED.  PATIENT IS ADVISED TO FOLLOW A HEALTHY DIET WITH AT LEAST SIX FRUITS/VEGGIES PER DAY, DECREASE INTAKE OF RED MEAT, AND TO INCREASE FISH INTAKE TO TWO DAYS PER WEEK.  MEATS/FISH SHOULD NOT BE FRIED, BAKED OR BROILED IS PREFERABLE.  IT IS ALSO IMPORTANT TO CUT BACK ON YOUR SUGAR INTAKE. PLEASE AVOID ANYTHING WITH ADDED SUGAR, CORN SYRUP OR OTHER SWEETENERS. IF YOU MUST USE A SWEETENER, YOU CAN TRY STEVIA. IT IS ALSO IMPORTANT TO AVOID ARTIFICIALLY SWEETENERS AND DIET BEVERAGES. LASTLY, I SUGGEST WEARING SPF 50 SUNSCREEN ON EXPOSED PARTS AND ESPECIALLY WHEN IN THE DIRECT SUNLIGHT FOR AN EXTENDED PERIOD OF TIME.  PLEASE AVOID FAST FOOD RESTAURANTS AND INCREASE YOUR WATER INTAKE.   2. Uncontrolled type 2 diabetes mellitus with hyperglycemia ([place]) Comments: Diabetic foot exam was performed. She will rto in 4 months for re-evaluation. I DISCUSSED WITH THE PATIENT AT LENGTH REGARDING THE GOALS OF GLYCEMIC CONTROL AND POSSIBLE LONG-TERM COMPLICATIONS.  I  ALSO STRESSED THE IMPORTANCE OF COMPLIANCE WITH HOME GLUCOSE MONITORING, DIETARY RESTRICTIONS INCLUDING AVOIDANCE OF SUGARY DRINKS/PROCESSED FOODS,  ALONG WITH REGULAR EXERCISE.  I  ALSO STRESSED THE IMPORTANCE OF ANNUAL EYE EXAMS, SELF FOOT CARE AND COMPLIANCE WITH OFFICE VISITS.  - POCT Urinalysis Dipstick (81002) - POCT UA - Microalbumin - Hemoglobin A1c - CBC - CMP14+EGFR - Lipid panel  3. Essential hypertension Comments: Chronic, controlled. EKG performed, NSR w/o acute changes. Advised to limit her intake of packaged foods which tend to be high in sodium.  - EKG 12-Lead  4. Cigarette nicotine dependence without complication Comments: She again declines low dose CT scan.  5. Encounter for immunization Comments: She plans  to get Shingrix within two weeks. States her [facility] specialist has already sent to the pharmacy for her.  Patient was given opportunity to ask questions. Patient verbalized understanding of the plan and was able to repeat key elements of the plan. All questions were answered to their satisfaction.   I, Maximino Greenland, MD, have reviewed all documentation for this visit. The documentation on 04/20/21 for the exam, diagnosis, procedures, and orders are all accurate and complete.  THE PATIENT IS ENCOURAGED TO PRACTICE SOCIAL DISTANCING DUE TO THE COVID-19 PANDEMIC.

## 2021-04-21 LAB — CMP14+EGFR
ALT: 8 IU/L (ref 0–32)
AST: 15 IU/L (ref 0–40)
Albumin/Globulin Ratio: 1.1 — ABNORMAL LOW (ref 1.2–2.2)
Albumin: 3.9 g/dL (ref 3.8–4.8)
Alkaline Phosphatase: 180 IU/L — ABNORMAL HIGH (ref 44–121)
BUN/Creatinine Ratio: 17 (ref 12–28)
BUN: 12 mg/dL (ref 8–27)
Bilirubin Total: 0.6 mg/dL (ref 0.0–1.2)
CO2: 24 mmol/L (ref 20–29)
Calcium: 9.1 mg/dL (ref 8.7–10.3)
Chloride: 105 mmol/L (ref 96–106)
Creatinine, Ser: 0.7 mg/dL (ref 0.57–1.00)
Globulin, Total: 3.5 g/dL (ref 1.5–4.5)
Glucose: 92 mg/dL (ref 65–99)
Potassium: 4.4 mmol/L (ref 3.5–5.2)
Sodium: 141 mmol/L (ref 134–144)
Total Protein: 7.4 g/dL (ref 6.0–8.5)
eGFR: 96 mL/min/{1.73_m2} (ref >59)

## 2021-04-21 LAB — LIPID PANEL
Chol/HDL Ratio: 2.8 ratio (ref 0.0–4.4)
Cholesterol, Total: 144 mg/dL (ref 100–199)
HDL: 51 mg/dL (ref >39)
LDL Chol Calc (NIH): 74 mg/dL (ref 0–99)
Triglycerides: 103 mg/dL (ref 0–149)
VLDL Cholesterol Cal: 19 mg/dL (ref 5–40)

## 2021-04-21 LAB — CBC
Hematocrit: 34.5 % (ref 34.0–46.6)
Hemoglobin: 11.9 g/dL (ref 11.1–15.9)
MCH: 31.6 pg (ref 26.6–33.0)
MCHC: 34.5 g/dL (ref 31.5–35.7)
MCV: 92 fL (ref 79–97)
Platelets: 115 10*3/uL — ABNORMAL LOW (ref 150–450)
RBC: 3.77 x10E6/uL (ref 3.77–5.28)
RDW: 14.3 % (ref 11.7–15.4)
WBC: 4.8 10*3/uL (ref 3.4–10.8)

## 2021-04-21 LAB — HEMOGLOBIN A1C
Est. average glucose Bld gHb Est-mCnc: 140 mg/dL
Hgb A1c MFr Bld: 6.5 % — ABNORMAL HIGH (ref 4.8–5.6)

## 2021-04-27 ENCOUNTER — Telehealth: Payer: Medicare Other

## 2021-04-27 ENCOUNTER — Telehealth: Payer: Self-pay

## 2021-04-27 NOTE — Telephone Encounter (Signed)
  Care Management   Follow Up Note   04/27/2021 Name: Kathryn Walsh MRN: [id number] DOB: [birth year]   Referred by: Glendale Chard, MD Reason for referral : Chronic Care Management (RN CM Follow up call )   An unsuccessful telephone outreach was attempted today. The patient was referred to the case management team for assistance with care management and care coordination.   Follow Up Plan: A HIPPA compliant phone message was left for the patient providing contact information and requesting a return call.   Barb Merino, RN, BSN, CCM Care Management Coordinator [place] [facility]  Direct Phone: [phone]

## 2021-05-09 ENCOUNTER — Ambulatory Visit (INDEPENDENT_AMBULATORY_CARE_PROVIDER_SITE_OTHER): Payer: Medicare Other

## 2021-05-09 ENCOUNTER — Other Ambulatory Visit: Payer: Self-pay

## 2021-05-09 VITALS — BP 130/74 | HR 98 | Temp 98.3°F | Ht 67.0 in | Wt 173.0 lb

## 2021-05-09 DIAGNOSIS — Z944 Liver transplant status: Secondary | ICD-10-CM | POA: Diagnosis not present

## 2021-05-09 DIAGNOSIS — R319 Hematuria, unspecified: Secondary | ICD-10-CM | POA: Diagnosis not present

## 2021-05-09 LAB — POCT URINALYSIS DIPSTICK
Bilirubin, UA: NEGATIVE
Glucose, UA: NEGATIVE
Ketones, UA: NEGATIVE
Leukocytes, UA: NEGATIVE
Nitrite, UA: NEGATIVE
Protein, UA: POSITIVE — AB
Spec Grav, UA: 1.02 (ref 1.010–1.025)
Urobilinogen, UA: 1 E.U./dL
pH, UA: 5.5 (ref 5.0–8.0)

## 2021-05-09 LAB — POCT UA - MICROALBUMIN

## 2021-05-09 NOTE — Progress Notes (Signed)
Pt is here to recheck urine.

## 2021-05-10 ENCOUNTER — Other Ambulatory Visit: Payer: Self-pay | Admitting: Internal Medicine

## 2021-05-10 DIAGNOSIS — R319 Hematuria, unspecified: Secondary | ICD-10-CM

## 2021-05-17 ENCOUNTER — Encounter: Payer: Self-pay | Admitting: Internal Medicine

## 2021-05-23 ENCOUNTER — Telehealth: Payer: Self-pay

## 2021-05-23 NOTE — Chronic Care Management (AMB) (Signed)
    No answer, unable to leave voicemail on either numbers with telephone appointment reminder with [name] CPP on 05-24-2021 at 9:00.   [place] Pharmacist Assistant [phone]

## 2021-05-24 ENCOUNTER — Telehealth: Payer: Medicare Other

## 2021-05-24 ENCOUNTER — Telehealth: Payer: Self-pay

## 2021-05-24 NOTE — Telephone Encounter (Signed)
  Care Management   Follow Up Note   05/24/2021 Name: Kathryn Walsh MRN: [id number] DOB: 07/04/1956   Referred by: Glendale Chard, MD Reason for referral : No chief complaint on file.   An unsuccessful telephone outreach was attempted today. The patient was referred to the case management team for assistance with care management and care coordination.   Follow Up Plan: Telephone follow up appointment with care management team member scheduled for: 05/24/2021.   [name], PharmD Clinical Pharmacist [facility] [phone]

## 2021-05-24 NOTE — Progress Notes (Deleted)
Current Barriers:  {pharmacybarriers:24917}  Pharmacist Clinical Goal(s):  Patient will {PHARMACYGOALCHOICES:24921} through collaboration with PharmD and provider.   Interventions: 1:1 collaboration with Glendale Chard, MD regarding development and update of comprehensive plan of care as evidenced by provider attestation and co-signature Inter-disciplinary care team collaboration (see longitudinal plan of care) Comprehensive medication review performed; medication list updated in electronic medical record  {CCM [facility] DISEASE STATES:25130}  Patient Goals/Self-Care Activities Patient will:  - {pharmacypatientgoals:24919}  Follow Up Plan: {CM FOLLOW UP PRXY:58592}   Chronic Care Management Pharmacy Note  05/24/2021 Name:  Kathryn Walsh MRN:  [id number] DOB:  19-Feb-1956  Summary: ***  Recommendations/Changes made from today's visit: ***  Plan: ***   Subjective: Kathryn Walsh is an 65 y.o. year old female who is a primary patient of Glendale Chard, MD.  The CCM team was consulted for assistance with disease management and care coordination needs.    {CCMTELEPHONEFACETOFACE:21091510} for {CCMINITIALFOLLOWUPCHOICE:21091511} in response to provider referral for pharmacy case management and/or care coordination services.   Consent to Services:  {CCMCONSENTOPTIONS:25074}  Patient Care Team: Glendale Chard, MD as PCP - General (Internal Medicine) Rex Kras, Claudette Stapler, RN as Case Manager Mayford Knife, [facility] (Pharmacist)  Recent office visits: ***  Recent consult visits: [facility] visits: {Hospital DC Yes/No:25215}   Objective:  Lab Results  Component Value Date   CREATININE 0.70 04/20/2021   BUN 12 04/20/2021   GFRNONAA 95 10/12/2020   GFRAA 109 10/12/2020   NA 141 04/20/2021   K 4.4 04/20/2021   CALCIUM 9.1 04/20/2021   CO2 24 04/20/2021   GLUCOSE 92 04/20/2021    Lab Results  Component Value Date/Time   HGBA1C 6.5 (H) 04/20/2021 10:29 AM    HGBA1C 6.1 (H) 01/10/2021 12:03 PM   MICROALBUR 150 04/20/2021 04:05 PM   MICROALBUR 150 04/12/2020 09:55 AM    Last diabetic Eye exam:  Lab Results  Component Value Date/Time   HMDIABEYEEXA No Retinopathy 05/16/2020 12:00 AM    Last diabetic Foot exam: No results found for: HMDIABFOOTEX   Lab Results  Component Value Date   CHOL 144 04/20/2021   HDL 51 04/20/2021   LDLCALC 74 04/20/2021   TRIG 103 04/20/2021   CHOLHDL 2.8 04/20/2021    Hepatic Function Latest Ref Rng & Units 04/20/2021 01/10/2021 04/12/2020  Total Protein 6.0 - 8.5 g/dL 7.4 7.5 7.1  Albumin 3.8 - 4.8 g/dL 3.9 4.1 4.1  AST 0 - 40 IU/L '15 20 15  ' ALT 0 - 32 IU/L '8 10 7  ' Alk Phosphatase 44 - 121 IU/L 180(H) 181(H) 155(H)  Total Bilirubin 0.0 - 1.2 mg/dL 0.6 0.7 0.6  Bilirubin, Direct 0.00 - 0.40 mg/dL - - -    No results found for: TSH, FREET4  CBC Latest Ref Rng & Units 04/20/2021 04/12/2020 04/07/2019  WBC 3.4 - 10.8 x10E3/uL 4.8 4.3 6.2  Hemoglobin 11.1 - 15.9 g/dL 11.9 12.5 14.3  Hematocrit 34.0 - 46.6 % 34.5 37.8 41.3  Platelets 150 - 450 x10E3/uL 115(L) 130(L) 131(L)    No results found for: VD25OH  Clinical ASCVD: {YES/NO:21197} The 10-year ASCVD risk score Mikey Bussing DC Jr., et al., 2013) is: 30%   Values used to calculate the score:     Age: 65 years     Sex: Female     Is Non-Hispanic African American: Yes     Diabetic: Yes     Tobacco smoker: Yes     Systolic Blood Pressure: 817 mmHg     Is BP  treated: Yes     HDL Cholesterol: 51 mg/dL     Total Cholesterol: 144 mg/dL    Depression screen [facility] 2/9 08/25/2020 07/02/2019 04/07/2019  Decreased Interest 0 0 0  Down, Depressed, Hopeless 0 0 0  PHQ - 2 Score 0 0 0  Altered sleeping - 0 -  Tired, decreased energy - 1 -  Change in appetite - 0 -  Feeling bad or failure about yourself  - 0 -  Trouble concentrating - 0 -  Moving slowly or fidgety/restless - 0 -  Suicidal thoughts - 0 -  PHQ-9 Score - 1 -  Difficult doing work/chores - Not difficult at  all -     ***Other: (CHADS2VASc if Afib, MMRC or CAT for COPD, ACT, DEXA)  Social History   Tobacco Use  Smoking Status Some Days   Packs/day: 0.75   Years: 40.00   Pack years: 30.00   Types: Cigarettes   Start date: 11/12/1974  Smokeless Tobacco Never  Tobacco Comments   encouraged to avoid smoking in the house and in her car   BP Readings from Last 3 Encounters:  05/09/21 130/74  04/20/21 134/82  04/04/21 116/78   Pulse Readings from Last 3 Encounters:  05/09/21 98  04/20/21 77  04/04/21 75   Wt Readings from Last 3 Encounters:  05/09/21 173 lb (78.5 kg)  04/20/21 172 lb (78 kg)  04/04/21 170 lb 9.6 oz (77.4 kg)   BMI Readings from Last 3 Encounters:  05/09/21 27.10 kg/m  04/20/21 26.62 kg/m  04/04/21 26.56 kg/m    Assessment/Interventions: Review of patient past medical history, allergies, medications, health status, including review of consultants reports, laboratory and other test data, was performed as part of comprehensive evaluation and provision of chronic care management services.   SDOH:  (Social Determinants of Health) assessments and interventions performed: {yes/no:20286}  SDOH Screenings   Alcohol Screen: Not on file  Depression (PHQ2-9): Low Risk    PHQ-2 Score: 0  Financial Resource Strain: Low Risk    Difficulty of Paying Living Expenses: Not hard at all  Food Insecurity: No Food Insecurity   Worried About Charity fundraiser in the Last Year: Never true   Ran Out of Food in the Last Year: Never true  Housing: Not on file  Physical Activity: Inactive   Days of Exercise per Week: 0 days   Minutes of Exercise per Session: 0 min  Social Connections: Not on file  Stress: No Stress Concern Present   Feeling of Stress : Not at all  Tobacco Use: High Risk   Smoking Tobacco Use: Some Days   Smokeless Tobacco Use: Never  Transportation Needs: No Transportation Needs   Lack of Transportation (Medical): No   Lack of Transportation  (Non-Medical): No    CCM Care Plan  No Known Allergies  Medications Reviewed Today     Reviewed by Debbora Dus, CMA (Certified Medical Assistant) on 05/09/21 at [phone]  Med List Status: <None>   Medication Order Taking? Sig Documenting Provider Last Dose Status Informant  amLODipine (NORVASC) 5 MG tablet [id number] Yes TAKE 1 TABLET BY MOUTH EVERY DAY Glendale Chard, MD Taking Active   aspirin 81 MG chewable tablet [id number] Yes Chew 81 mg by mouth daily. [provider] Taking Active Self  azaTHIOprine (IMURAN) 50 MG tablet [id number] Yes Take 100 mg by mouth 2 (two) times daily.  [provider] Taking Active Self  Blood Glucose Monitoring Suppl (ACCU-CHEK GUIDE ME) w/Device KIT  [id number] Yes Use to check blood sugar 3 times a day. Dx code e11.65 Glendale Chard, MD Taking Active   Cholecalciferol 25 MCG (1000 UT) capsule [id number]  Take 4,000 Units by mouth daily.  Patient not taking: Reported on 04/04/2021   [provider]  Active   cyclobenzaprine (FLEXERIL) 10 MG tablet [id number] Yes TAKE 1 TABLET BY MOUTH EVERYDAY AT BEDTIME Ghumman, Ramandeep, NP Taking Active   glucose blood (ACCU-CHEK GUIDE) test strip [id number] Yes Use as instructed to check blood sugar 3 times a day. Dx code e11.65 Glendale Chard, MD Taking Active   insulin degludec [facility]) 200 UNIT/ML FlexTouch Pen [id number] Yes Inject 42 Units into the skin. Pt doing 32 units qhs [provider] Taking Active   Insulin Lispro (HUMALOG KWIKPEN) 200 UNIT/ML SOPN [id number] Yes Inject 6 Units into the skin 2 (two) times daily before a meal. Please inject 6 units before breakfast and dinner; max dose titration 40 units daily Glendale Chard, MD Taking Active   pravastatin (PRAVACHOL) 40 MG tablet [id number] Yes TAKE 1 TABLET BY MOUTH EVERYDAY AT BEDTIME Glendale Chard, MD Taking Active   propranolol (INDERAL) 20 MG tablet [id number] Yes Take 20 mg by mouth 2 (two) times daily. Once daily  [provider] Taking Active Self  tacrolimus (PROGRAF) 1 MG capsule [id number] Yes Take 2 mg in am and 3 mg in pm [provider] Taking Active Self  Turmeric 500 MG TABS [id number] Yes Take by mouth. 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    Medication Assistance: {MEDASSISTANCEINFO:25044}  Compliance/Adherence/Medication fill history: Care Gaps: ***  Star-Rating Drugs: ***  Patient's preferred pharmacy is:  [id number] [place]Phone: [phone]: [phone] [id number] [address] [place] [address]one: [phone]: [phone] pill box? {Yes or If no, why not?:20788} Pt endorses  ***% compliance  We discussed: {Pharmacy options:24294} Patient decided to: {US Pharmacy Plan:23885}  Care Plan and Follow Up Patient Decision:  {FOLLOWUP:24991}  Plan: {CM FOLLOW UP PLAN:25073}  ***

## 2021-06-01 ENCOUNTER — Telehealth: Payer: Medicare Other

## 2021-06-13 ENCOUNTER — Encounter: Payer: Self-pay | Admitting: Internal Medicine

## 2021-06-22 ENCOUNTER — Other Ambulatory Visit: Payer: Self-pay | Admitting: Internal Medicine

## 2021-06-30 IMAGING — MG MM DIGITAL SCREENING BILAT W/ TOMO AND CAD
8 series · 8 of 24 positions shown · non-contrast
Comparison: Previous exam(s).

ACR Breast Density Category a: The breast tissue is almost entirely
fatty.

CLINICAL DATA: Screening.

EXAM:
DIGITAL SCREENING BILATERAL MAMMOGRAM WITH TOMOSYNTHESIS AND CAD
TECHNIQUE: Bilateral screening digital craniocaudal and mediolateral oblique
mammograms were obtained. Bilateral screening digital breast
tomosynthesis was performed. The images were evaluated with
computer-aided detection.

[L MLO synth-2D]
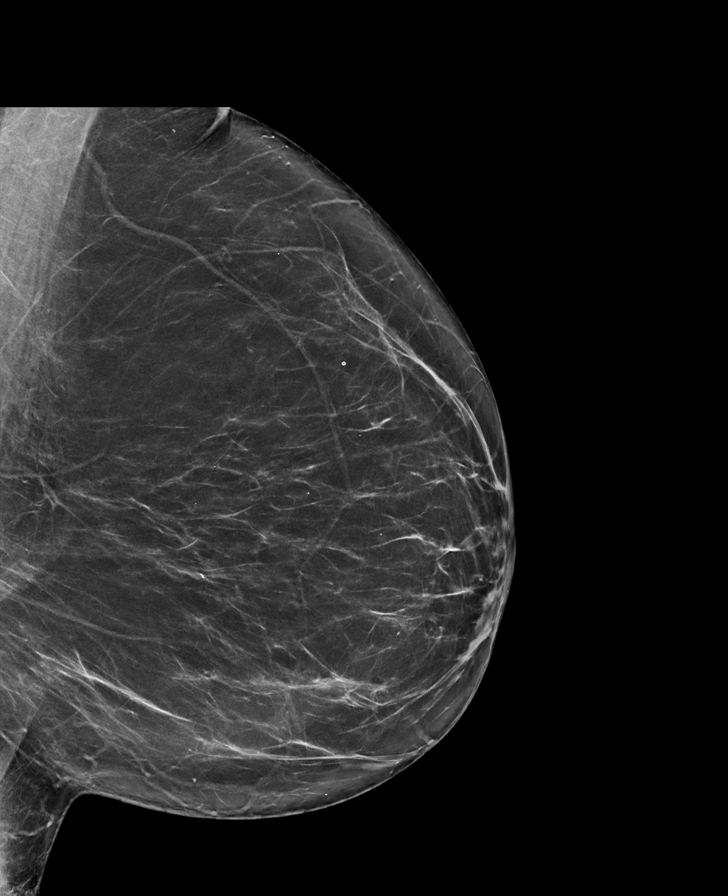

[R CC synth-2D]
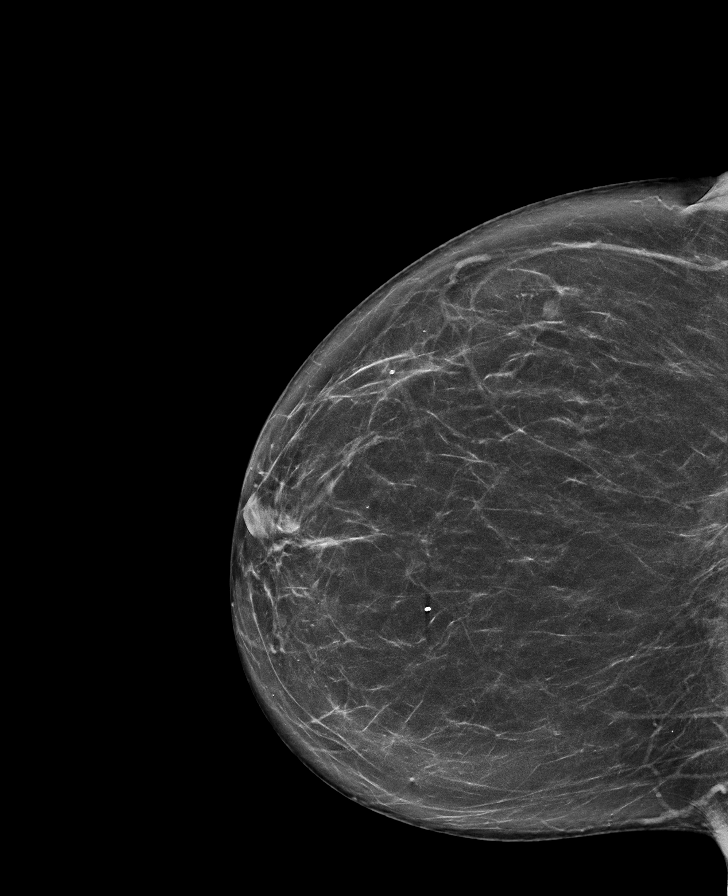

[R MLO synth-2D]
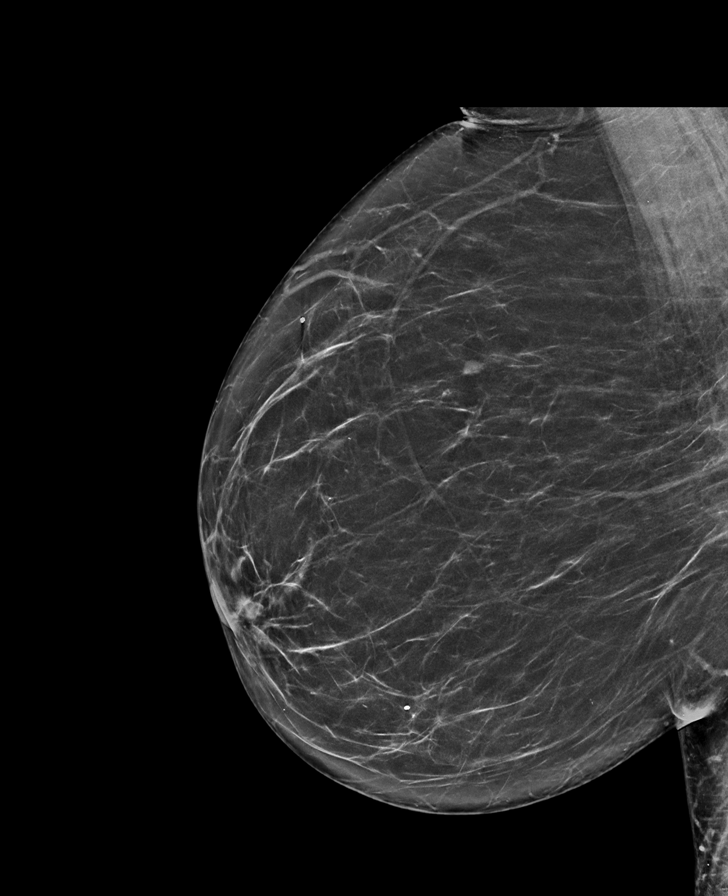

[L CC synth-2D]
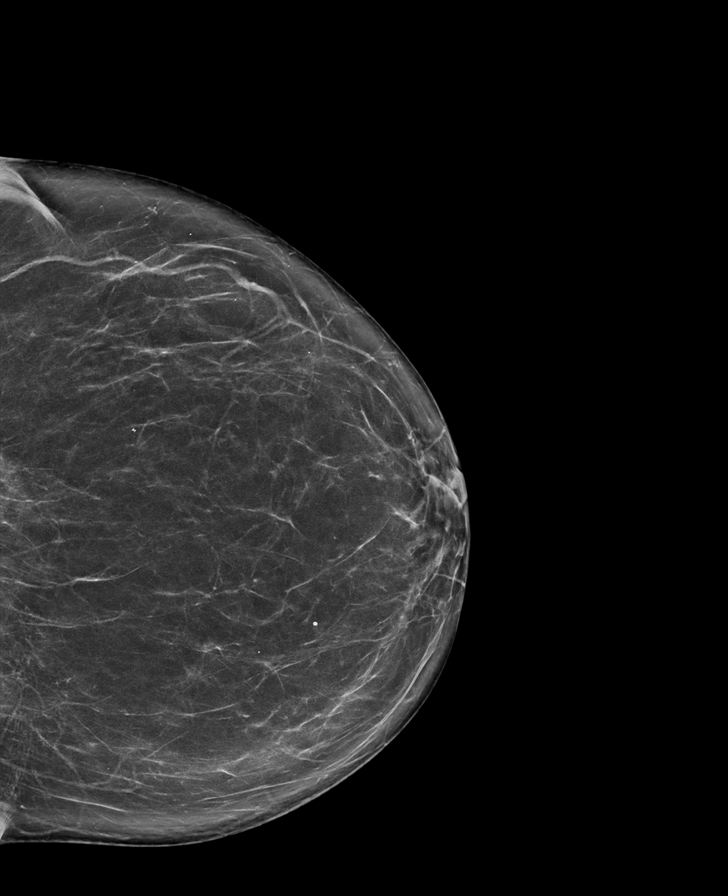

[L MLO tomo · tomo slice 39/76.0]
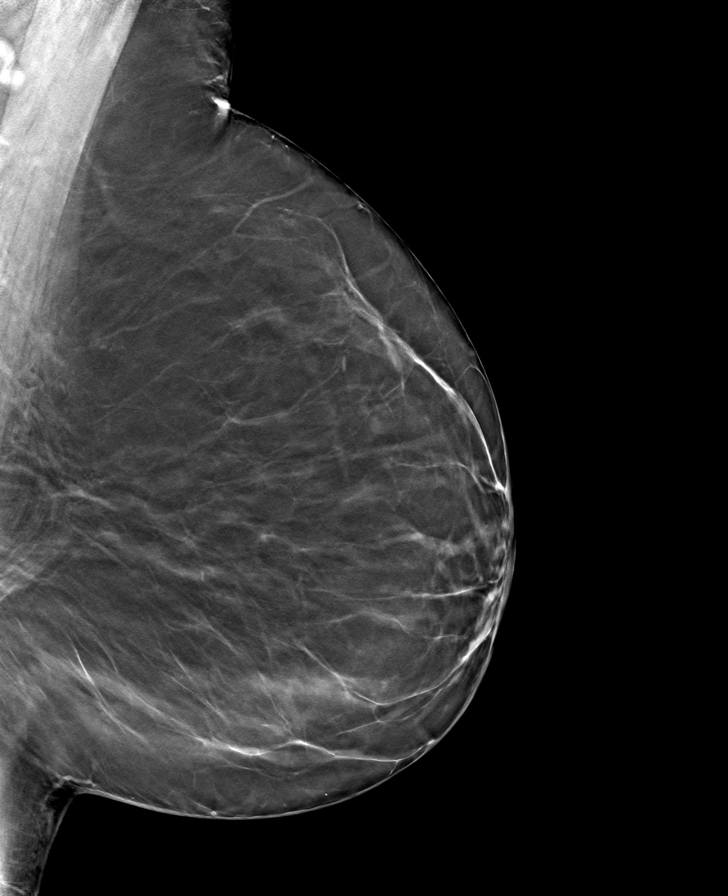

[R CC tomo · tomo slice 37/73.0]
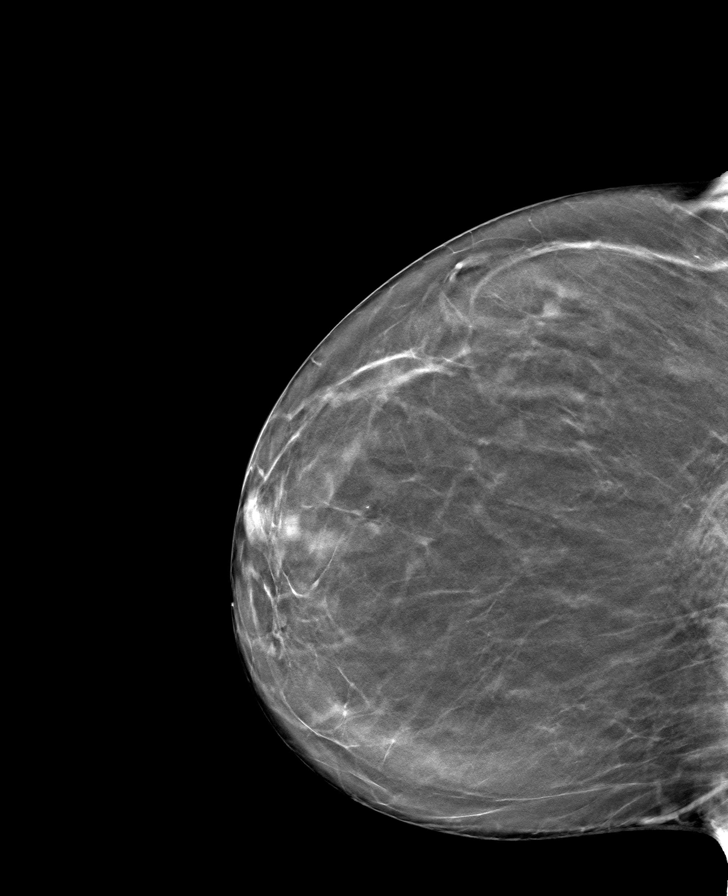

[L CC tomo · tomo slice 39/76.0]
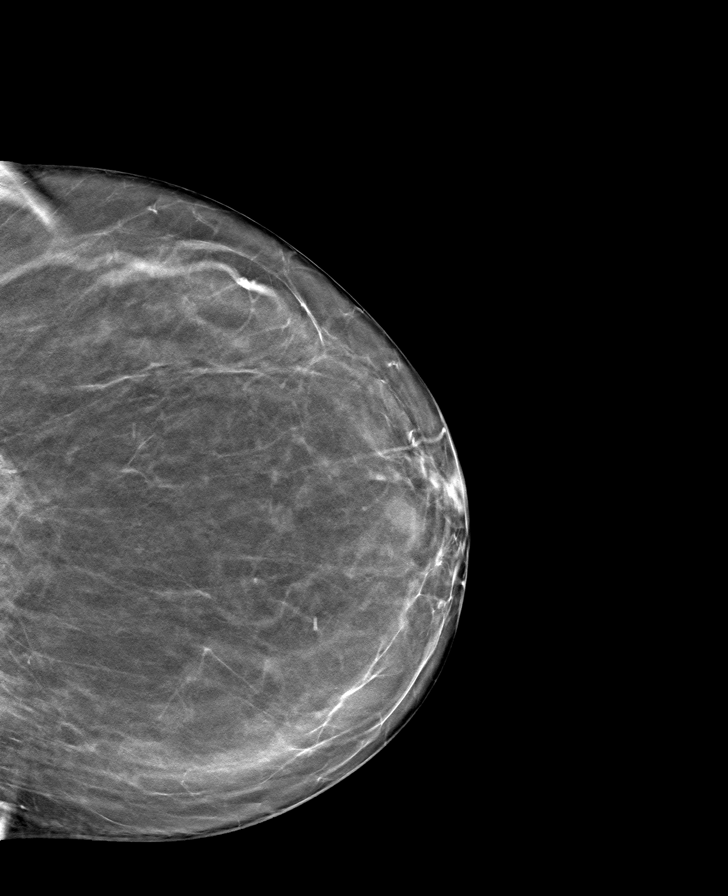

[R MLO tomo · tomo slice 37/74.0]
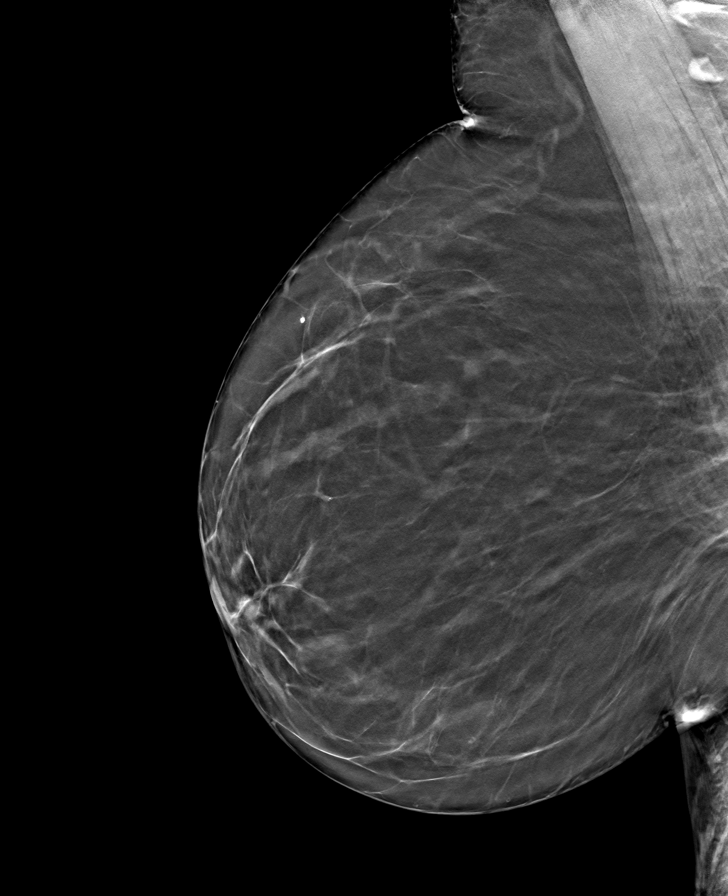

[8 of 24 positions shown; findings below may reference images not displayed]

FINDINGS: There are no findings suspicious for malignancy. The images were
evaluated with computer-aided detection.
IMPRESSION: No mammographic evidence of malignancy. A result letter of this
screening mammogram will be mailed directly to the patient.

RECOMMENDATION:
Screening mammogram in one year. (Code:JP-J-DD5)

BI-RADS CATEGORY  1: Negative.

## 2021-07-05 ENCOUNTER — Telehealth: Payer: Medicare Other

## 2021-07-05 ENCOUNTER — Telehealth: Payer: Self-pay

## 2021-07-05 NOTE — Telephone Encounter (Signed)
  Care Management   Follow Up Note   07/05/2021 Name: Teneya Patty MRN: [id number] DOB: August 12, 1956   Referred by: Glendale Chard, MD Reason for referral : Chronic Care Management (RN CM Follow up call )   An unsuccessful telephone outreach was attempted today. The patient was referred to the case management team for assistance with care management and care coordination.   Follow Up Plan: Telephone follow up appointment with care management team member scheduled for: 07/20/21   Barb Merino, RN, BSN, CCM Care Management Coordinator [place] [facility]  Direct Phone: [phone]

## 2021-07-06 ENCOUNTER — Ambulatory Visit
Admission: RE | Admit: 2021-07-06 | Discharge: 2021-07-06 | Disposition: A | Discharge status: Home / Self Care | Payer: Medicare Other | Source: Ambulatory Visit | Attending: Internal Medicine | Admitting: Internal Medicine

## 2021-07-06 ENCOUNTER — Other Ambulatory Visit: Payer: Self-pay

## 2021-07-06 DIAGNOSIS — E2839 Other primary ovarian failure: Secondary | ICD-10-CM

## 2021-07-06 DIAGNOSIS — M810 Age-related osteoporosis without current pathological fracture: Secondary | ICD-10-CM | POA: Diagnosis not present

## 2021-07-06 DIAGNOSIS — Z780 Asymptomatic menopausal state: Secondary | ICD-10-CM | POA: Diagnosis not present

## 2021-07-15 ENCOUNTER — Other Ambulatory Visit: Payer: Self-pay | Admitting: Internal Medicine

## 2021-07-15 DIAGNOSIS — M816 Localized osteoporosis [Lequesne]: Secondary | ICD-10-CM

## 2021-07-17 ENCOUNTER — Telehealth: Payer: Self-pay | Admitting: Internal Medicine

## 2021-07-17 NOTE — Telephone Encounter (Signed)
Left message for patient to call back and schedule Medicare Annual Wellness Visit (AWV) either virtually or in office.  Left both my jabber number [phone] and office number    Last AWV ;08/25/20 please schedule at anytime with [facility]    This should be a 45 minute visit.    [employer] insurance can be calendar year

## 2021-07-19 ENCOUNTER — Telehealth: Payer: Self-pay

## 2021-07-19 NOTE — Chronic Care Management (AMB) (Signed)
  Chronic Care Management Pharmacy Assistant   Name: Kathryn Walsh  MRN: [id number] DOB: 01/04/1956   Reason for Encounter: Disease State/ Hypertension   Recent office visits:  04-20-2021 Sanders, Robyn, MD. Platelets= 115. Albumin/ Globulin= 1.1. Alkaline phosphate= 180. A1C= 6.5.  05-09-2021 Hamilton, Victoria T, CMA. Urine recheck.  Recent consult visits:  None  Hospital visits:  None in previous 6 months  Medications: Outpatient Encounter Medications as of 07/19/2021  Medication Sig   amLODipine (NORVASC) 5 MG tablet TAKE 1 TABLET BY MOUTH EVERY DAY   aspirin 81 MG chewable tablet Chew 81 mg by mouth daily.   azaTHIOprine (IMURAN) 50 MG tablet Take 100 mg by mouth 2 (two) times daily.    Blood Glucose Monitoring Suppl (ACCU-CHEK GUIDE ME) w/Device KIT Use to check blood sugar 3 times a day. 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 (Patient not taking: Reported on 04/04/2021)   cyclobenzaprine (FLEXERIL) 10 MG tablet TAKE 1 TABLET BY MOUTH EVERYDAY AT BEDTIME   glucose blood (ACCU-CHEK GUIDE) test strip Use as instructed to check blood sugar 3 times a day. 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 Pt doing 32 units qhs   Insulin Lispro (HUMALOG KWIKPEN) 200 UNIT/ML SOPN Inject 6 Units into the skin 2 (two) times daily before a meal. Please inject 6 units before breakfast and dinner; max dose titration 40 units daily   pravastatin (PRAVACHOL) 40 MG tablet TAKE 1 TABLET BY MOUTH EVERYDAY AT BEDTIME   propranolol (INDERAL) 20 MG tablet Take 20 mg by mouth 2 (two) times daily. Once daily   tacrolimus (PROGRAF) 1 MG capsule Take 2 mg in am and 3 mg in pm   Turmeric 500 MG TABS Take by mouth.   No facility-administered encounter medications on file as of 07/19/2021.  Reviewed chart prior to disease state call. Spoke with patient regarding BP  Recent Office  Vitals: BP Readings from Last 3 Encounters:  05/09/21 130/74  04/20/21 134/82  04/04/21 116/78   Pulse Readings from Last 3 Encounters:  05/09/21 98  04/20/21 77  04/04/21 75    Wt Readings from Last 3 Encounters:  05/09/21 173 lb (78.5 kg)  04/20/21 172 lb (78 kg)  04/04/21 170 lb 9.6 oz (77.4 kg)     Kidney Function Lab Results  Component Value Date/Time   CREATININE 0.70 04/20/2021 10:29 AM   CREATININE 0.70 01/10/2021 12:03 PM   GFRNONAA 95 10/12/2020 10:57 AM   GFRAA 109 10/12/2020 10:57 AM    BMP Latest Ref Rng & Units 04/20/2021 01/10/2021 10/12/2020  Glucose 65 - 99 mg/dL 92 62(L) 167(H)  BUN 8 - 27 mg/dL 12 13 12  Creatinine 0.57 - 1.00 mg/dL 0.70 0.70 0.64  BUN/Creat Ratio 12 - 28 17 19 19  Sodium 134 - 144 mmol/L 141 138 140  Potassium 3.5 - 5.2 mmol/L 4.4 3.9 4.2  Chloride 96 - 106 mmol/L 105 101 104  CO2 20 - 29 mmol/L 24 23 23  Calcium 8.7 - 10.3 mg/dL 9.1 9.2 8.9    Current antihypertensive regimen:  Propranolol 20 mg twice daily Amlodipine 5 mg daily  How often are you checking your Blood Pressure? Patient states she isn't checking blood pressure at home.  Current home BP readings: None  What recent interventions/DTPs have been made by any provider to improve Blood Pressure control since last CPP   Visit:  No interventions made but patient states she takes medications as directed.  Any recent hospitalizations or ED visits since last visit with CPP? No  What diet changes have been made to improve Blood Pressure Control?  Patient states she doesn't use much salt, drinks plenty of water and eats fruits/ vegetables most days.  What exercise is being done to improve your Blood Pressure Control?  Patient states she works part time and walks some days.  Adherence Review: Is the patient currently on ACE/ARB medication? No Does the patient have >5 day gap between last estimated fill dates? No  NOTES:  Rescheduled missed appointment in July to  November.  Care Gaps: Shingrix overdue Covid booster overdue Yearly Ophthalmology overdue  Star Rating Drugs: Pravastatin 40 mg- Last filled 06-09-2021 [facility]  Malecca Hicks CMA Clinical Pharmacist Assistant [phone]  

## 2021-07-20 ENCOUNTER — Telehealth: Payer: Medicare Other

## 2021-07-20 ENCOUNTER — Ambulatory Visit (INDEPENDENT_AMBULATORY_CARE_PROVIDER_SITE_OTHER): Payer: Medicare Other

## 2021-07-20 DIAGNOSIS — E1165 Type 2 diabetes mellitus with hyperglycemia: Secondary | ICD-10-CM

## 2021-07-20 DIAGNOSIS — I10 Essential (primary) hypertension: Secondary | ICD-10-CM

## 2021-07-20 NOTE — Chronic Care Management (AMB) (Signed)
  Care Management   Follow Up Note   07/20/2021 Name: Kathryn Walsh MRN: [id number] DOB: October 07, 1956   Referred by: Glendale Chard, MD Reason for referral : Chronic Care Management (RN CM Follow up call (4th attempt))  Fourth unsuccessful telephone outreach was attempted today. The patient was referred to the case management team for assistance with care management and care coordination. The patient's primary care provider has been notified of our unsuccessful attempts to make or maintain contact with the patient. The care management team is pleased to engage with this patient at [place] time in the future should she be interested in assistance from the care management team.   Follow Up Plan: We have been unable to make contact with the patient for follow up. The care management team is available to follow up with the patient after provider conversation with the patient regarding recommendation for care management engagement and subsequent re-referral to the care management team.   Barb Merino, RN, BSN, CCM Care Management Coordinator [facility]  Direct Phone: [phone]

## 2021-07-28 DIAGNOSIS — I10 Essential (primary) hypertension: Secondary | ICD-10-CM | POA: Diagnosis not present

## 2021-07-28 DIAGNOSIS — E1165 Type 2 diabetes mellitus with hyperglycemia: Secondary | ICD-10-CM

## 2021-08-24 NOTE — Progress Notes (Signed)
Office Visit Note  Patient: Kathryn Walsh             Date of Birth: July 09, 1956           MRN: [id number]             PCP: Glendale Chard, MD Referring: Glendale Chard, MD Visit Date: 09/07/2021 Occupation: @GUAROCC @  Subjective:  Treatment of osteoporosis   History of Present Illness: Kathryn Walsh is a 65 y.o. female seen in consultation per request of Dr. Baird Cancer for the treatment of his osteoporosis.  Patient states she had recent bone density and this was her first.  She recalls taking vitamin D in the beginning of this year.  She is not taking vitamin D now.  She denies any history of a stress fracture.  There is no family history of osteoporosis.  She is taking long-term systemic prednisone for many years and came off the prednisone about 4 years ago.  She states that she has some joint pain with aging process.  She has longstanding lower back pain which radiates into her right lower extremity.  She has not seen a spine specialist.  Activities of Daily Living:  Patient reports morning stiffness for 0 minutes.   Patient Denies nocturnal pain.  Difficulty dressing/grooming: Denies Difficulty climbing stairs: Reports Difficulty getting out of chair: Denies Difficulty using hands for taps, buttons, cutlery, and/or writing: Denies  Review of Systems  Constitutional:  Positive for fatigue.  HENT:  Negative for mouth sores, mouth dryness and nose dryness.   Eyes:  Negative for pain, itching and dryness.  Respiratory:  Negative for shortness of breath and difficulty breathing.   Cardiovascular:  Negative for chest pain and palpitations.  Gastrointestinal:  Negative for blood in stool, constipation and diarrhea.  Endocrine: Negative for increased urination.  Genitourinary:  Negative for difficulty urinating.  Musculoskeletal:  Positive for joint pain and joint pain. Negative for joint swelling, myalgias, morning stiffness, muscle tenderness and myalgias.  Skin:  Negative for  color change, rash, redness and sensitivity to sunlight.  Allergic/Immunologic: Negative for susceptible to infections.  Neurological:  Negative for dizziness, numbness, headaches, memory loss and weakness.  Hematological:  Negative for bruising/bleeding tendency.  Psychiatric/Behavioral:  Positive for depressed mood. Negative for confusion and sleep disturbance. The patient is not nervous/anxious.    PMFS History:  Patient Active Problem List   Diagnosis Date Noted   Cigarette nicotine dependence without complication 65/78/4696   Pap smear for cervical cancer screening 01/04/2014   Chronic pain syndrome 01/04/2014   Diabetes ([place]) 10/06/2013   Depression 10/06/2013   Hyperglycemia 07/31/2013   Cellulitis and abscess 07/30/2013   Immunosuppressed status ([place]) 07/30/2013   Essential hypertension 07/22/2013   S/P liver transplant ([place]) 07/11/2013   HCV (hepatitis C virus) 07/11/2013   Type II diabetes mellitus, uncontrolled 07/11/2013    Past Medical History:  Diagnosis Date   Diabetes mellitus without complication ([place])    High cholesterol    HTN (hypertension)    Liver transplant recipient [facility])     Family History  Problem Relation Age of Onset   Healthy Mother    Healthy Father    Heart disease Sister    Heart disease Brother    Gout Brother    Heart disease Brother    Healthy Daughter    Diabetes Son    Past Surgical History:  Procedure Laterality Date   LIVER TRANSPLANTATION     Social History   Social History Narrative  Not on file   Immunization History  Administered Date(s) Administered   DTaP 06/10/2012   Influenza, High Dose Seasonal PF 08/16/2021   Influenza,inj,Quad PF,6+ Mos 10/06/2013, 08/12/2015, 08/01/2018, 07/02/2019   Influenza-Unspecified 08/18/2012, 08/06/2018, 07/26/2020   Moderna Sars-Covid-2 Vaccination 11/03/2019, 12/01/2019, 07/26/2020   Pfizer Covid-19 Vaccine Bivalent Booster 57yrs & up 09/02/2021   Pneumococcal Conjugate-13  08/12/2015   Pneumococcal Polysaccharide-23 07/13/2013, 12/16/2018     Objective: Vital Signs: BP (!) 173/80 (BP Location: Right Arm, Patient Position: Sitting, Cuff Size: Normal)   Pulse 70   Ht 5\' 7"  (1.702 m)   Wt 178 lb (80.7 kg)   BMI 27.88 kg/m    Physical Exam Vitals and nursing note reviewed.  Constitutional:      Appearance: She is well-developed.  HENT:     Head: Normocephalic and atraumatic.  Eyes:     Conjunctiva/sclera: Conjunctivae normal.  Cardiovascular:     Rate and Rhythm: Normal rate and regular rhythm.     Heart sounds: Normal heart sounds.  Pulmonary:     Effort: Pulmonary effort is normal.     Breath sounds: Normal breath sounds.  Abdominal:     General: Bowel sounds are normal.     Palpations: Abdomen is soft.  Musculoskeletal:     Cervical back: Normal range of motion.  Lymphadenopathy:     Cervical: No cervical adenopathy.  Skin:    General: Skin is warm and dry.     Capillary Refill: Capillary refill takes less than 2 seconds.  Neurological:     Mental Status: She is alert and oriented to person, place, and time.  Psychiatric:        Behavior: Behavior normal.     Musculoskeletal Exam: C-spine was in good range of motion.  She had painful limited range of motion of her lumbar spine.  Shoulder joints, elbow joints, wrist joints, MCPs PIPs and DIPs with good range of motion with no synovitis.  Hip joints, knee joints, ankles, MTPs and PIPs with good range of motion with no synovitis.  CDAI Exam: CDAI Score: -- Patient Global: --; Provider Global: -- Swollen: --; Tender: -- Joint Exam 09/07/2021   No joint exam has been documented for this visit   There is currently no information documented on the homunculus. Go to the Rheumatology activity and complete the homunculus joint exam.  Investigation: No additional findings.  Imaging: No results found.  Recent Labs: Lab Results  Component Value Date   WBC 4.8 04/20/2021   HGB 11.9  04/20/2021   PLT 115 (L) 04/20/2021   NA 141 04/20/2021   K 4.4 04/20/2021   CL 105 04/20/2021   CO2 24 04/20/2021   GLUCOSE 92 04/20/2021   BUN 12 04/20/2021   CREATININE 0.70 04/20/2021   BILITOT 0.6 04/20/2021   ALKPHOS 180 (H) 04/20/2021   AST 15 04/20/2021   ALT 8 04/20/2021   PROT 7.4 04/20/2021   ALBUMIN 3.9 04/20/2021   CALCIUM 9.1 04/20/2021   GFRAA 109 10/12/2020    Speciality Comments: No specialty comments available.  Procedures:  No procedures performed Allergies: Patient has no known allergies.   Assessment / Plan:     Visit Diagnoses: Localized osteoporosis without current pathological fracture - DEXA: 07/06/21: The BMD measured at Forearm Radius 33% is 0.542 g/cm2 with a T-scoreof -3.9.  -Patient was recently diagnosed with osteoporosis.  She has not received any treatment for osteoporosis.  There is no history of distress prescribed.  She denies any family history  of osteoporosis.  She is taking long-term systemic steroids for many years due to liver disease.  She has been off steroids for last 4 years according to the patient.  We detailed discussion regarding osteoporosis.  Different treatment options and their side effects were discussed.  I discussed the option of starting her on alendronate.  Indications, side effects and contraindications were discussed.  She is edentulous and uses dentures.  Handout was given.  Plan: TSH, Parathyroid hormone, intact (no Ca), VITAMIN D 25 Hydroxy (Vit-D Deficiency, Fractures), Serum protein electrophoresis with reflex  Vitamin D deficiency-patient states she was diagnosed with vitamin D deficiency earlier this year and was given vitamin D for short time.  Chronic midline low back pain with right-sided sciatica -she complains of chronic lower back pain and right-sided radiculopathy.  She has weakness of her right quadriceps and hamstrings.  Muscle atrophy was also noted.  I will refer her to orthopedics for evaluation.  Plan: AMB  referral to orthopedics  Medication monitoring encounter -in anticipation to start her on Fosamax I will obtain following labs today.  Plan: COMPLETE METABOLIC PANEL WITH GFR  S/P liver transplant ([place]) - followed at [facility]  Immunosuppressed status ([place]) - Imuran 50 mg 2 tablets dialy.  The medication is managed by the gastroenterologist.  History of hepatitis C-treated per patient.  Essential hypertension-blood pressure is elevated today.  She has been advised to monitor blood pressure closely and follow-up with her PCP.  History of type 2 diabetes mellitus  Chronic pain syndrome  History of depression  Cigarette nicotine dependence without complication - 3/4 PPDx 40 years.  Strong association of smoking with osteoporosis was discussed.  Smoking cessation was also discussed.  Orders: Orders Placed This Encounter  Procedures   COMPLETE METABOLIC PANEL WITH GFR   TSH   Parathyroid hormone, intact (no Ca)   VITAMIN D 25 Hydroxy (Vit-D Deficiency, Fractures)   Serum protein electrophoresis with reflex   AMB referral to orthopedics    No orders of the defined types were placed in this encounter.    Follow-Up Instructions: Return in about 1 week (around 09/14/2021) for Osteoporosis.   Bo Merino, MD  Note - This record has been created using Editor, commissioning.  Chart creation errors have been sought, but may not always  have been located. Such creation errors do not reflect on  the standard of medical care.

## 2021-08-30 ENCOUNTER — Other Ambulatory Visit: Payer: Self-pay

## 2021-08-30 ENCOUNTER — Ambulatory Visit (INDEPENDENT_AMBULATORY_CARE_PROVIDER_SITE_OTHER): Payer: Medicare Other

## 2021-08-30 ENCOUNTER — Telehealth: Payer: Self-pay

## 2021-08-30 VITALS — BP 140/62 | HR 80 | Temp 98.8°F | Ht 67.0 in | Wt 174.6 lb

## 2021-08-30 DIAGNOSIS — Z0000 Encounter for general adult medical examination without abnormal findings: Secondary | ICD-10-CM | POA: Diagnosis not present

## 2021-08-30 NOTE — Chronic Care Management (AMB) (Signed)
    Called [place], No answer, left message of appointment on 08-31-2021 at 3:00 via telephone visit with [name], Pharm D. Notified to have all medications, supplements, blood pressure and/or blood sugar logs available during appointment and to return call if need to reschedule.    Care Gaps: Shingrix overdue Covid booster overdue Yearly Ophthalmology overdue  Star Rating Drug: Pravastatin 40 mg- Last filled [facility]-09-2021 [facility]    Any gaps in medications fill history? No   [place] Pharmacist Assistant [phone]

## 2021-08-30 NOTE — Patient Instructions (Addendum)
Kathryn Walsh , Thank you for taking time to come for your Medicare Wellness Visit. I appreciate your ongoing commitment to your health goals. Please review the following plan we discussed and let me know if I can assist you in the future.   Screening recommendations/referrals: Colonoscopy: cologuard 12/08/2018, due 12/08/2021 Mammogram: completed 02/16/2021 Bone Density: completed 07/06/2021 Recommended yearly ophthalmology/optometry visit for glaucoma screening and checkup Recommended yearly dental visit for hygiene and checkup  Vaccinations: Influenza vaccine: completed 08/17/2021 Pneumococcal vaccine: completed 12/16/2018 Tdap vaccine: 06/10/2012, due 06/10/2022 Shingles vaccine: discussed   Covid-19: 07/26/2020, 12/01/2019, 11/03/2019  Advanced directives: Please bring a copy of your POA (Power of Attorney) and/or Living Will to your next appointment.   Conditions/risks identified: smoking  Next appointment: Follow up in one year for your annual wellness visit    Preventive Care 70 Years and Older, Female Preventive care refers to lifestyle choices and visits with your health care provider that can promote health and wellness. What does preventive care include? A yearly physical exam. This is also called an annual well check. Dental exams once or twice a year. Routine eye exams. Ask your health care provider how often you should have your eyes checked. Personal lifestyle choices, including: Daily care of your teeth and gums. Regular physical activity. Eating a healthy diet. Avoiding tobacco and drug use. Limiting alcohol use. Practicing safe sex. Taking low-dose aspirin every day. Taking vitamin and mineral supplements as recommended by your health care provider. What happens during an annual well check? The services and screenings done by your health care provider during your annual well check will depend on your age, overall health, lifestyle risk factors, and family history of  disease. Counseling  Your health care provider may ask you questions about your: Alcohol use. Tobacco use. Drug use. Emotional well-being. Home and relationship well-being. Sexual activity. Eating habits. History of falls. Memory and ability to understand (cognition). Work and work Statistician. Reproductive health. Screening  You may have the following tests or measurements: Height, weight, and BMI. Blood pressure. Lipid and cholesterol levels. These may be checked every 5 years, or more frequently if you are over 44 years old. Skin check. Lung cancer screening. You may have this screening every year starting at age 73 if you have a 30-pack-year history of smoking and currently smoke or have quit within the past 15 years. Fecal occult blood test (FOBT) of the stool. You may have this test every year starting at age 69. Flexible sigmoidoscopy or colonoscopy. You may have a sigmoidoscopy every 5 years or a colonoscopy every 10 years starting at age 33. Hepatitis C blood test. Hepatitis B blood test. Sexually transmitted disease (STD) testing. Diabetes screening. This is done by checking your blood sugar (glucose) after you have not eaten for a while (fasting). You may have this done every 1-3 years. Bone density scan. This is done to screen for osteoporosis. You may have this done starting at age 43. Mammogram. This may be done every 1-2 years. Talk to your health care provider about how often you should have regular mammograms. Talk with your health care provider about your test results, treatment options, and if necessary, the need for more tests. Vaccines  Your health care provider may recommend certain vaccines, such as: Influenza vaccine. This is recommended every year. Tetanus, diphtheria, and acellular pertussis (Tdap, Td) vaccine. You may need a Td booster every 10 years. Zoster vaccine. You may need this after age 82. Pneumococcal 13-valent conjugate (PCV13) vaccine. One  dose is recommended after age 35. Pneumococcal polysaccharide (PPSV23) vaccine. One dose is recommended after age 4. Talk to your health care provider about which screenings and vaccines you need and how often you need them. This information is not intended to replace advice given to you by your health care provider. Make sure you discuss any questions you have with your health care provider. Document Released: 11/11/2015 Document Revised: 07/04/2016 Document Reviewed: 08/16/2015 Elsevier Interactive Patient Education  2017 [place] Prevention in the Home Falls can cause injuries. They can happen to people of all ages. There are many things you can do to make your home safe and to help prevent falls. What can I do on the outside of my home? Regularly fix the edges of walkways and driveways and fix any cracks. Remove anything that might make you trip as you walk through a door, such as a raised step or threshold. Trim any bushes or trees on the path to your home. Use bright outdoor lighting. Clear any walking paths of anything that might make someone trip, such as rocks or tools. Regularly check to see if handrails are loose or broken. Make sure that both sides of any steps have handrails. Any raised decks and porches should have guardrails on the edges. Have any leaves, snow, or ice cleared regularly. Use sand or salt on walking paths during winter. Clean up any spills in your garage right away. This includes oil or grease spills. What can I do in the bathroom? Use night lights. Install grab bars by the toilet and in the tub and shower. Do not use towel bars as grab bars. Use non-skid mats or decals in the tub or shower. If you need to sit down in the shower, use a plastic, non-slip stool. Keep the floor dry. Clean up any water that spills on the floor as soon as it happens. Remove soap buildup in the tub or shower regularly. Attach bath mats securely with double-sided  non-slip rug tape. Do not have throw rugs and other things on the floor that can make you trip. What can I do in the bedroom? Use night lights. Make sure that you have a light by your bed that is easy to reach. Do not use any sheets or blankets that are too big for your bed. They should not hang down onto the floor. Have a firm chair that has side arms. You can use this for support while you get dressed. Do not have throw rugs and other things on the floor that can make you trip. What can I do in the kitchen? Clean up any spills right away. Avoid walking on wet floors. Keep items that you use a lot in easy-to-reach places. If you need to reach something above you, use a strong step stool that has a grab bar. Keep electrical cords out of the way. Do not use floor polish or wax that makes floors slippery. If you must use wax, use non-skid floor wax. Do not have throw rugs and other things on the floor that can make you trip. What can I do with my stairs? Do not leave any items on the stairs. Make sure that there are handrails on both sides of the stairs and use them. Fix handrails that are broken or loose. Make sure that handrails are as long as the stairways. Check any carpeting to make sure that it is firmly attached to the stairs. Fix any carpet that is loose or worn. Avoid  having throw rugs at the top or bottom of the stairs. If you do have throw rugs, attach them to the floor with carpet tape. Make sure that you have a light switch at the top of the stairs and the bottom of the stairs. If you do not have them, ask someone to add them for you. What else can I do to help prevent falls? Wear shoes that: Do not have high heels. Have rubber bottoms. Are comfortable and fit you well. Are closed at the toe. Do not wear sandals. If you use a stepladder: Make sure that it is fully opened. Do not climb a closed stepladder. Make sure that both sides of the stepladder are locked into place. Ask  someone to hold it for you, if possible. Clearly mark and make sure that you can see: Any grab bars or handrails. First and last steps. Where the edge of each step is. Use tools that help you move around (mobility aids) if they are needed. These include: Canes. Walkers. Scooters. Crutches. Turn on the lights when you go into a dark area. Replace any light bulbs as soon as they burn out. Set up your furniture so you have a clear path. Avoid moving your furniture around. If any of your floors are uneven, fix them. If there are any pets around you, be aware of where they are. Review your medicines with your doctor. Some medicines can make you feel dizzy. This can increase your chance of falling. Ask your doctor what other things that you can do to help prevent falls. This information is not intended to replace advice given to you by your health care provider. Make sure you discuss any questions you have with your health care provider. Document Released: 08/11/2009 Document Revised: 03/22/2016 Document Reviewed: 11/19/2014 Elsevier Interactive Patient Education  2017 [employer].

## 2021-08-30 NOTE — Progress Notes (Signed)
This visit occurred during the SARS-CoV-2 public health emergency.  Safety protocols were in place, including screening questions prior to the visit, additional usage of staff PPE, and extensive cleaning of exam room while observing appropriate contact time as indicated for disinfecting solutions.  Subjective:   Kathryn Walsh is a 65 y.o. female who presents for Medicare Annual (Subsequent) preventive examination.  Review of Systems     Cardiac Risk Factors include: advanced age (>4mn, >>59women);diabetes mellitus;hypertension;sedentary lifestyle;smoking/ tobacco exposure     Objective:    Today's Vitals   08/30/21 1452  BP: 140/62  Pulse: 80  Temp: 98.8 F (37.1 C)  TempSrc: Oral  Weight: 174 lb 9.6 oz (79.2 kg)  Height: _0  (1.702 m)   Body mass index is 27.35 kg/m.  Advanced Directives 08/30/2021 08/25/2020 07/02/2019 08/22/2017 01/31/2017 10/09/2014 07/30/2013  Does Patient Have a Medical Advance Directive? Yes Yes Yes Yes No No Patient has advance directive, copy not in chart  Type of Advance Directive [place] will Living will [place] will [place] will - - Living will  [place] of [place]? No - copy requested - No - copy requested - - - Copy requested from family  Would patient like information on creating a medical advance directive? - - - - No - Patient declined No - patient declined information -  Pre-existing out of facility DNR order (yellow form or pink MOST form) - - - - - - No    Current Medications (verified) Outpatient Encounter Medications as of 08/30/2021  Medication Sig   amLODipine (NORVASC) 5 MG tablet TAKE 1 TABLET BY MOUTH EVERY DAY   aspirin 81 MG chewable tablet Chew 81 mg by mouth daily.   azaTHIOprine (IMURAN) 50 MG tablet Take 100 mg by mouth 2 (two) times daily.    Blood Glucose Monitoring Suppl (ACCU-CHEK GUIDE ME) w/Device KIT Use to  check blood sugar 3 times a day. Dx code e11.65   glucose blood (ACCU-CHEK GUIDE) test strip Use as instructed to check blood sugar 3 times a day. 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 Pt doing 32 units qhs   Insulin Lispro (HUMALOG KWIKPEN) 200 UNIT/ML SOPN Inject 6 Units into the skin 2 (two) times daily before a meal. Please inject 6 units before breakfast and dinner; max dose titration 40 units daily   pravastatin (PRAVACHOL) 40 MG tablet TAKE 1 TABLET BY MOUTH EVERYDAY AT BEDTIME   propranolol (INDERAL) 20 MG tablet Take 20 mg by mouth 2 (two) times daily. Once daily   tacrolimus (PROGRAF) 1 MG capsule Take 2 mg in am and 3 mg in pm   Turmeric 500 MG TABS Take by mouth.   Cholecalciferol 25 MCG (1000 UT) capsule Take 4,000 Units by mouth daily. (Patient not taking: No sig reported)   cyclobenzaprine (FLEXERIL) 10 MG tablet TAKE 1 TABLET BY MOUTH EVERYDAY AT BEDTIME (Patient not taking: Reported on 08/30/2021)   No facility-administered encounter medications on file as of 08/30/2021.    Allergies (verified) Patient has no known allergies.   History: Past Medical History:  Diagnosis Date   Diabetes mellitus without complication (HCC)    High cholesterol    HTN (hypertension)    Liver transplant recipient ([facility]    Past Surgical History:  Procedure Laterality Date   LIVER TRANSPLANTATION     Family History  Problem  Relation Age of Onset   Healthy Mother    Healthy Father    Social History   Socioeconomic History   Marital status: Single    Spouse name: Not on file   Number of children: Not on file   Years of education: Not on file   Highest education level: Not on file  Occupational History   Occupation: employed  Tobacco Use   Smoking status: Some Days    Packs/day: 0.75    Years: 40.00    Pack years: 30.00    Types: Cigarettes    Start date: 11/12/1974   Smokeless tobacco: Never   Tobacco  comments:    encouraged to avoid smoking in the house and in her car  Vaping Use   Vaping Use: Never used  Substance and Sexual Activity   Alcohol use: No   Drug use: No   Sexual activity: Not Currently  Other Topics Concern   Not on file  Social History Narrative   Not on file   Social Determinants of Health   Financial Resource Strain: Low Risk    Difficulty of Paying Living Expenses: Not hard at all  Food Insecurity: Food Insecurity Present   Worried About Charity fundraiser in the Last Year: Sometimes true   [place] in the Last Year: Sometimes true  Transportation Needs: No Transportation Needs   Lack of Transportation (Medical): No   Lack of Transportation (Non-Medical): No  Physical Activity: Inactive   Days of Exercise per Week: 0 days   Minutes of Exercise per Session: 0 min  Stress: No Stress Concern Present   Feeling of Stress : Not at all  Social Connections: Not on file    Tobacco Counseling Ready to quit: Yes Counseling given: Not Answered Tobacco comments: encouraged to avoid smoking in the house and in her car   Clinical Intake:  Pre-visit preparation completed: Yes  Pain : No/denies pain     Nutritional Status: BMI 25 -29 Overweight Nutritional Risks: None Diabetes: Yes  How often do you need to have someone help you when you read instructions, pamphlets, or other written materials from your doctor or pharmacy?: 1 - Never What is the last grade level you completed in school?: 22yr college  Diabetic? Yes Nutrition Risk Assessment:  Has the patient had any N/V/D within the last 2 months?  No  Does the patient have any non-healing wounds?  No  Has the patient had any unintentional weight loss or weight gain?  No   Diabetes:  Is the patient diabetic?  Yes  If diabetic, was a CBG obtained today?  No  Did the patient bring in their glucometer from home?  No  How often do you monitor your CBG's? Twice daily.   Financial Strains and  Diabetes Management:  Are you having any financial strains with the device, your supplies or your medication? No .  Does the patient want to be seen by Chronic Care Management for management of their diabetes?  No  Would the patient like to be referred to a Nutritionist or for Diabetic Management?  No   Diabetic Exams:  Diabetic Eye Exam: Overdue for diabetic eye exam. Pt has been advised about the importance in completing this exam. Patient advised to call and schedule an eye exam. Diabetic Foot Exam: Completed 10/12/2020   Interpreter Needed?: No  Information entered by :: NAllen LPN   Activities of Daily Living In your present state of health, do you  have any difficulty performing the following activities: 08/30/2021  Hearing? N  Vision? Y  Comment blurriness at times  Difficulty concentrating or making decisions? N  Walking or climbing stairs? Y  Dressing or bathing? N  Doing errands, shopping? N  Preparing Food and eating ? N  Using the Toilet? N  In the past six months, have you accidently leaked urine? N  Do you have problems with loss of bowel control? N  Managing your Medications? N  Managing your Finances? N  Housekeeping or managing your Housekeeping? N  Some recent data might be hidden    Patient Care Team: Glendale Chard, MD as PCP - General (Internal Medicine) Mayford Knife, [facility] (Pharmacist)  Indicate any recent Medical Services you may have received from other than [facility] providers in the past year (date may be approximate).     Assessment:   This is a routine wellness examination for Kathryn Walsh.  Hearing/Vision screen No results found.  Dietary issues and exercise activities discussed: Current Exercise Habits: The patient does not participate in regular exercise at present   Goals Addressed             This Visit's Progress    Patient Stated       08/30/2021, quit smoking       Depression Screen PHQ 2/9 Scores 08/30/2021 08/25/2020  07/02/2019 04/07/2019 12/08/2018 10/06/2018  PHQ - 2 Score 0 0 0 0 0 0  PHQ- 9 Score - - 1 - - -    Fall Risk Fall Risk  08/30/2021 08/25/2020 07/02/2019 07/02/2019 04/07/2019  Falls in the past year? 0 1 0 0 0  Comment - tripped over bag - - -  Number falls in past yr: - 0 - - -  Injury with Fall? - 0 - - -  Risk for fall due to : Medication side effect Medication side effect - Medication side effect -  Follow up Falls evaluation completed;Education provided;Falls prevention discussed Falls evaluation completed;Education provided;Falls prevention discussed - Falls evaluation completed;Education provided;Falls prevention discussed -    FALL RISK PREVENTION PERTAINING TO THE HOME:  Any stairs in or around the home? Yes  If so, are there any without handrails? No  Home free of loose throw rugs in walkways, pet beds, electrical cords, etc? Yes  Adequate lighting in your home to reduce risk of falls? Yes   ASSISTIVE DEVICES UTILIZED TO PREVENT FALLS:  Life alert? No  Use of a cane, walker or w/c? No  Grab bars in the bathroom? No  Shower chair or bench in shower? No  Elevated toilet seat or a handicapped toilet? No   TIMED UP AND GO:  Was the test performed? No .    Gait steady and fast without use of assistive device  Cognitive Function:     6CIT Screen 08/30/2021 08/25/2020 07/02/2019  What Year? 0 points 0 points 0 points  What month? 0 points 0 points 0 points  What time? 0 points 0 points 0 points  Count back from 20 0 points 0 points 0 points  Months in reverse 0 points 0 points 0 points  Repeat phrase 2 points 0 points 0 points  Total Score 2 0 0    Immunizations Immunization History  Administered Date(s) Administered   DTaP 06/10/2012   Influenza, High Dose Seasonal PF 08/16/2021   Influenza,inj,Quad PF,6+ Mos 10/06/2013, 08/12/2015, 08/01/2018, 07/02/2019   Influenza-Unspecified 08/18/2012, 08/06/2018, 07/26/2020   Moderna Sars-Covid-2 Vaccination 11/03/2019, 12/01/2019    Pneumococcal Conjugate-13  08/12/2015   Pneumococcal Polysaccharide-23 07/13/2013, 12/16/2018    TDAP status: Up to date  Flu Vaccine status: Up to date  Pneumococcal vaccine status: Up to date  Covid-19 vaccine status: Completed vaccines  Qualifies for Shingles Vaccine? Yes   Zostavax completed No   Shingrix Completed?: No.    Education has been provided regarding the importance of this vaccine. Patient has been advised to call insurance company to determine out of pocket expense if they have not yet received this vaccine. Advised may also receive vaccine at local pharmacy or Health Dept. Verbalized acceptance and understanding.  Screening Tests Health Maintenance  Topic Date Due   Zoster Vaccines- Shingrix (1 of 2) Never done   COVID-19 Vaccine (4 - Booster for Moderna series) 09/20/2020   OPHTHALMOLOGY EXAM  05/16/2021   HIV Screening  04/20/2022 (Originally 11/21/1970)   FOOT EXAM  10/12/2021   HEMOGLOBIN A1C  10/20/2021   Fecal DNA (Cologuard)  12/08/2021   URINE MICROALBUMIN  04/20/2022   TETANUS/TDAP  06/10/2022   MAMMOGRAM  02/17/2023   PAP SMEAR-Modifier  04/13/2023   Pneumonia Vaccine 50+ Years old (4 - PPSV23 if available, else PCV20) 12/17/2023   INFLUENZA VACCINE  Completed   DEXA SCAN  Completed   Hepatitis C Screening  Completed   HPV VACCINES  Aged Out    Health Maintenance  Health Maintenance Due  Topic Date Due   Zoster Vaccines- Shingrix (1 of 2) Never done   COVID-19 Vaccine (4 - Booster for Moderna series) 09/20/2020   OPHTHALMOLOGY EXAM  05/16/2021    Colorectal cancer screening: Type of screening: Cologuard. Completed 12/08/2018. Repeat every 3 years  Mammogram status: Completed 02/16/2021. Repeat every year  Bone Density status: Completed 07/06/2021.   Lung Cancer Screening: (Low Dose CT Chest recommended if Age 81-80 years, 30 pack-year currently smoking OR have quit w/in 15years.) does not qualify.   Lung Cancer Screening Referral:  no  Additional Screening:  Hepatitis C Screening: does qualify; Completed 07/11/2013  Vision Screening: Recommended annual ophthalmology exams for early detection of glaucoma and other disorders of the eye. Is the patient up to date with their annual eye exam?  Yes  Who is the provider or what is the name of the office in which the patient attends annual eye exams? Dr. [place] If pt is not established with a provider, would they like to be referred to a provider to establish care? No .   Dental Screening: Recommended annual dental exams for proper oral hygiene  Community Resource Referral / Chronic Care Management: CRR required this visit?  No   CCM required this visit?  No      Plan:     I have personally reviewed and noted the following in the patient's chart:   Medical and social history Use of alcohol, tobacco or illicit drugs  Current medications and supplements including opioid prescriptions.  Functional ability and status Nutritional status Physical activity Advanced directives List of other physicians Hospitalizations, surgeries, and ER visits in previous 12 months Vitals Screenings to include cognitive, depression, and falls Referrals and appointments  In addition, I have reviewed and discussed with patient certain preventive protocols, quality metrics, and best practice recommendations. A written personalized care plan for preventive services as well as general preventive health recommendations were provided to patient.     Kellie Simmering, LPN   [id number]/[id number]   Nurse Notes: none

## 2021-08-31 ENCOUNTER — Ambulatory Visit (INDEPENDENT_AMBULATORY_CARE_PROVIDER_SITE_OTHER): Payer: Medicare Other

## 2021-08-31 DIAGNOSIS — E1165 Type 2 diabetes mellitus with hyperglycemia: Secondary | ICD-10-CM

## 2021-08-31 DIAGNOSIS — I10 Essential (primary) hypertension: Secondary | ICD-10-CM

## 2021-08-31 NOTE — Progress Notes (Signed)
Chronic Care Management Pharmacy Note  09/11/2021 Name:  Kathryn Walsh MRN:  [id number] DOB:  1956-07-29  Summary: Patient reports that she is doing okay   Recommendations/Changes made from today's visit: Recommend patient continue to take her medication daily.   Plan: Patient repots that she is going to take her medication at the same time each day.    Subjective: Kathryn Walsh is an 65 y.o. year old female who is a primary patient of Glendale Chard, MD.  The CCM team was consulted for assistance with disease management and care coordination needs.    Engaged with patient by telephone for follow up visit in response to provider referral for pharmacy case management and/or care coordination services.   Consent to Services:  The patient was given information about Chronic Care Management services, agreed to services, and gave verbal consent prior to initiation of services.  Please see initial visit note for detailed documentation.   Patient Care Team: Glendale Chard, MD as PCP - General (Internal Medicine) Mayford Knife, [facility] (Pharmacist)  Recent office visits: 04/20/2021 PCP OV 04/04/2021 PCP OV  Recent consult visits: 07/06/2021 DEXA scan completed   Hospital visits: None in previous 6 months   Objective:  Lab Results  Component Value Date   CREATININE 0.83 09/07/2021   BUN 20 09/07/2021   GFRNONAA 95 10/12/2020   GFRAA 109 10/12/2020   NA 141 09/07/2021   K 4.2 09/07/2021   CALCIUM 8.9 09/07/2021   CO2 26 09/07/2021   GLUCOSE 86 09/07/2021    Lab Results  Component Value Date/Time   HGBA1C 6.5 (H) 04/20/2021 10:29 AM   HGBA1C 6.1 (H) 01/10/2021 12:03 PM   MICROALBUR 150 04/20/2021 04:05 PM   MICROALBUR 150 04/12/2020 09:55 AM    Last diabetic Eye exam:  Lab Results  Component Value Date/Time   HMDIABEYEEXA No Retinopathy 05/16/2020 12:00 AM    Last diabetic Foot exam: No results found for: HMDIABFOOTEX   Lab Results  Component Value Date    CHOL 144 04/20/2021   HDL 51 04/20/2021   LDLCALC 74 04/20/2021   TRIG 103 04/20/2021   CHOLHDL 2.8 04/20/2021    Hepatic Function Latest Ref Rng & Units 09/07/2021 04/20/2021 01/10/2021  Total Protein 6.1 - 8.1 g/dL 7.3 7.4 7.5  Albumin 3.8 - 4.8 g/dL - 3.9 4.1  AST 10 - 35 U/L _0 ALT 6 - 29 U/L _1 Alk Phosphatase 44 - 121 IU/L - 180(H) 181(H)  Total Bilirubin 0.2 - 1.2 mg/dL 0.6 0.6 0.7  Bilirubin, Direct 0.00 - 0.40 mg/dL - - -    Lab Results  Component Value Date/Time   TSH 1.18 09/07/2021 09:40 AM    CBC Latest Ref Rng & Units 04/20/2021 04/12/2020 04/07/2019  WBC 3.4 - 10.8 x10E3/uL 4.8 4.3 6.2  Hemoglobin 11.1 - 15.9 g/dL 11.9 12.5 14.3  Hematocrit 34.0 - 46.6 % 34.5 37.8 41.3  Platelets 150 - 450 x10E3/uL 115(L) 130(L) 131(L)    Lab Results  Component Value Date/Time   VD25OH 33 09/07/2021 09:40 AM    Clinical ASCVD: No  The 10-year ASCVD risk score (Arnett DK, et al., 2019) is: 51.1%   Values used to calculate the score:     Age: 50 years     Sex: Female     Is Non-Hispanic African American: Yes     Diabetic: Yes     Tobacco smoker: Yes     Systolic Blood Pressure: 400 mmHg  Is BP treated: Yes     HDL Cholesterol: 51 mg/dL     Total Cholesterol: 144 mg/dL    Depression screen [facility] 2/9 08/30/2021 08/25/2020 07/02/2019  Decreased Interest 0 0 0  Down, Depressed, Hopeless 0 0 0  PHQ - 2 Score 0 0 0  Altered sleeping - - 0  Tired, decreased energy - - 1  Change in appetite - - 0  Feeling bad or failure about yourself  - - 0  Trouble concentrating - - 0  Moving slowly or fidgety/restless - - 0  Suicidal thoughts - - 0  PHQ-9 Score - - 1  Difficult doing work/chores - - Not difficult at all      Social History   Tobacco Use  Smoking Status Some Days   Packs/day: 0.75   Years: 40.00   Pack years: 30.00   Types: Cigarettes   Start date: 11/12/1974  Smokeless Tobacco Never  Tobacco Comments   encouraged to avoid smoking in the house and  in her car   BP Readings from Last 3 Encounters:  09/07/21 (!) 173/80  08/30/21 140/62  05/09/21 130/74   Pulse Readings from Last 3 Encounters:  09/07/21 70  08/30/21 80  05/09/21 98   Wt Readings from Last 3 Encounters:  09/07/21 178 lb (80.7 kg)  08/30/21 174 lb 9.6 oz (79.2 kg)  05/09/21 173 lb (78.5 kg)   BMI Readings from Last 3 Encounters:  09/07/21 27.88 kg/m  08/30/21 27.35 kg/m  05/09/21 27.10 kg/m    Assessment/Interventions: Review of patient past medical history, allergies, medications, health status, including review of consultants reports, laboratory and other test data, was performed as part of comprehensive evaluation and provision of chronic care management services.   SDOH:  (Social Determinants of Health) assessments and interventions performed: No  SDOH Screenings   Alcohol Screen: Not on file  Depression (PHQ2-9): Low Risk    PHQ-2 Score: 0  Financial Resource Strain: Low Risk    Difficulty of Paying Living Expenses: Not hard at all  Food Insecurity: Food Insecurity Present   Worried About Charity fundraiser in the Last Year: Sometimes true   Arboriculturist in the Last Year: Sometimes true  Housing: Not on file  Physical Activity: Inactive   Days of Exercise per Week: 0 days   Minutes of Exercise per Session: 0 min  Social Connections: Not on file  Stress: No Stress Concern Present   Feeling of Stress : Not at all  Tobacco Use: High Risk   Smoking Tobacco Use: Some Days   Smokeless Tobacco Use: Never   Passive Exposure: Not on file  Transportation Needs: No Transportation Needs   Lack of Transportation (Medical): No   Lack of Transportation (Non-Medical): No    CCM Care Plan  No Known Allergies  Medications Reviewed Today     Reviewed by Bo Merino, MD (Physician) on 09/07/21 at [place] List Status: <None>   Medication Order Taking? Sig Documenting Provider Last Dose Status Informant  amLODipine (NORVASC) 5 MG tablet  [id number] Yes TAKE 1 TABLET BY MOUTH EVERY DAY Glendale Chard, MD Taking Active   aspirin 81 MG chewable tablet [id number] Yes Chew 81 mg by mouth daily. [provider] Taking Active Self  azaTHIOprine (IMURAN) 50 MG tablet [id number] Yes Take 100 mg by mouth 2 (two) times daily.  [provider] Taking Active Self  Blood Glucose Monitoring Suppl (ACCU-CHEK GUIDE ME) w/Device KIT [id number] Yes Use to  check blood sugar 3 times a day. Dx code e11.65 Glendale Chard, MD Taking Active   Cholecalciferol 25 MCG (1000 UT) capsule [id number] No Take 4,000 Units by mouth daily.  Patient not taking: Reported on 09/07/2021   [provider] Not Taking Active   cyclobenzaprine (FLEXERIL) 10 MG tablet [id number] No TAKE 1 TABLET BY MOUTH EVERYDAY AT BEDTIME  Patient not taking: Reported on 09/07/2021   Bary Castilla, NP Not Taking Active   glucose blood (ACCU-CHEK GUIDE) test strip [id number] Yes Use as instructed to check blood sugar 3 times a day. Dx code e11.65 Glendale Chard, MD Taking Active   insulin degludec [facility]) 200 UNIT/ML FlexTouch Pen [id number] Yes Inject 42 Units into the skin. Pt doing 32 units qhs [provider] Taking Active   Insulin Lispro (HUMALOG KWIKPEN) 200 UNIT/ML SOPN [id number] Yes Inject 6 Units into the skin 2 (two) times daily before a meal. Please inject 6 units before breakfast and dinner; max dose titration 40 units daily Glendale Chard, MD Taking Active   pravastatin (PRAVACHOL) 40 MG tablet [id number] Yes TAKE 1 TABLET BY MOUTH EVERYDAY AT BEDTIME Glendale Chard, MD Taking Active   propranolol (INDERAL) 20 MG tablet [id number] Yes Take 20 mg by mouth 2 (two) times daily. Once daily [provider] Taking Active Self  tacrolimus (PROGRAF) 1 MG capsule [id number] Yes Take 2 mg in am and 3 mg in pm [provider] Taking Active Self  Turmeric 500 MG TABS [id number] Yes Take by mouth. 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   Type II diabetes mellitus, uncontrolled 07/11/2013    Immunization History  Administered Date(s) Administered   DTaP 06/10/2012   Influenza, High Dose Seasonal PF 08/16/2021   Influenza,inj,Quad PF,6+ Mos 10/06/2013, 08/12/2015, 08/01/2018, 07/02/2019   Influenza-Unspecified 08/18/2012, 08/06/2018, 07/26/2020   Moderna Sars-Covid-2 Vaccination 11/03/2019, 12/01/2019, 07/26/2020   Pfizer Covid-19 Vaccine Bivalent Booster 53yr & up 09/02/2021   Pneumococcal Conjugate-13 08/12/2015   Pneumococcal Polysaccharide-23 07/13/2013, 12/16/2018    Conditions to be addressed/monitored:  Hypertension and Diabetes  Care Plan : [place] Updates made by PMayford Knife RAndersonsince 09/11/2021 12:00 AM     Problem: HTN, DM II      Long-Range Goal: Disease Management   Note:    Current Barriers:  Unable to independently monitor therapeutic efficacy  Pharmacist Clinical Goal(s):  Patient will achieve adherence to monitoring guidelines and medication adherence to achieve therapeutic efficacy through collaboration with PharmD and provider.   Interventions: 1:1 collaboration with SGlendale Chard MD regarding development and update of comprehensive plan of care as evidenced by provider attestation and co-signature Inter-disciplinary care team collaboration (see longitudinal plan of care) Comprehensive medication review performed; medication list updated in electronic medical  record  Hypertension (BP goal <130/80) -Controlled -Current treatment: Amlodipine 5 mg tablet once per day Propranolol 20 mg tablet twice per day -Current home readings: patient is not currently checking her BP at home  -Current dietary habits:  She does not eat lunch -Current exercise habits: recommend that patient continue with current exercise  -Denies hypotensive/hypertensive symptoms -Educated on BP goals and benefits of medications for prevention of heart attack, stroke and kidney damage; Symptoms of hypotension and importance of maintaining adequate hydration; -Counseled to monitor BP at home at least once per week, document, and provide log at future appointments -Recommended to continue current medication  Diabetes (A1c goal <7%) -Controlled -Current medications: Humalog 200 unit/ml - inject 6 units two times daily before a meal  Tresiba 200 unit/ml - inject 42 units at night  -Current home glucose readings fasting glucose: 138,127 post prandial glucose: 274  -Denies hypoglycemic/hyperglycemic symptoms -Current meal patterns:  breakfast: oatmeal   dinner: she eats a full course meal with green, starch and meat  drinks: about 5-6 bottles of water per day  -Current exercise: she does things around the house and she works part time. 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  -Educated on A1c and blood sugar goals; Complications of diabetes including kidney damage, retinal damage, and cardiovascular disease; -Counseled to check feet daily and get yearly eye exams -Recommended to continue current medication  Health Maintenance -Vaccine gaps: COVID-19 Vaccine, Shingrix vaccine  -Recommended to continue current medication   Follow Up Plan: The patient has been provided with contact information for the care management team and has been advised to call with any health related questions or concerns.       Medication Assistance: None required.  Patient affirms  current coverage meets needs.  Compliance/Adherence/Medication fill history: Care Gaps: Shingrix Vaccine   Star-Rating Drugs: Pravastatin 40 mg tablet   Patient's preferred pharmacy is:  [id number] [address] [place] [place]Phone: [phone]: [phone] Uses pill box? Yes Pt endorses 95% compliance  We discussed: Current pharmacy is preferred with insurance plan and patient is satisfied with pharmacy services Patient decided to: Continue current medication management strategy  Care Plan and Follow Up Patient Decision:  Patient agrees to Care Plan and Follow-up.  Plan: The patient has been provided with contact information for the care management team and has been advised to call with any health related questions or concerns.   VOrlando Penner CPP, PharmD Clinical Pharmacist Practitioner [facility] [phone]

## 2021-09-07 ENCOUNTER — Other Ambulatory Visit: Payer: Self-pay

## 2021-09-07 ENCOUNTER — Encounter: Payer: Self-pay | Admitting: Rheumatology

## 2021-09-07 ENCOUNTER — Ambulatory Visit: Payer: Medicare Other | Admitting: Rheumatology

## 2021-09-07 ENCOUNTER — Telehealth: Payer: Self-pay

## 2021-09-07 VITALS — BP 173/80 | HR 70 | Ht 67.0 in | Wt 178.0 lb

## 2021-09-07 DIAGNOSIS — F17210 Nicotine dependence, cigarettes, uncomplicated: Secondary | ICD-10-CM

## 2021-09-07 DIAGNOSIS — D849 Immunodeficiency, unspecified: Secondary | ICD-10-CM

## 2021-09-07 DIAGNOSIS — G894 Chronic pain syndrome: Secondary | ICD-10-CM | POA: Diagnosis not present

## 2021-09-07 DIAGNOSIS — E559 Vitamin D deficiency, unspecified: Secondary | ICD-10-CM | POA: Diagnosis not present

## 2021-09-07 DIAGNOSIS — M5441 Lumbago with sciatica, right side: Secondary | ICD-10-CM

## 2021-09-07 DIAGNOSIS — Z5181 Encounter for therapeutic drug level monitoring: Secondary | ICD-10-CM | POA: Diagnosis not present

## 2021-09-07 DIAGNOSIS — Z8659 Personal history of other mental and behavioral disorders: Secondary | ICD-10-CM

## 2021-09-07 DIAGNOSIS — M816 Localized osteoporosis [Lequesne]: Secondary | ICD-10-CM

## 2021-09-07 DIAGNOSIS — G8929 Other chronic pain: Secondary | ICD-10-CM

## 2021-09-07 DIAGNOSIS — I10 Essential (primary) hypertension: Secondary | ICD-10-CM

## 2021-09-07 DIAGNOSIS — Z8639 Personal history of other endocrine, nutritional and metabolic disease: Secondary | ICD-10-CM | POA: Diagnosis not present

## 2021-09-07 DIAGNOSIS — Z944 Liver transplant status: Secondary | ICD-10-CM

## 2021-09-07 DIAGNOSIS — Z8619 Personal history of other infectious and parasitic diseases: Secondary | ICD-10-CM | POA: Diagnosis not present

## 2021-09-07 NOTE — Patient Instructions (Signed)
Alendronate Solution What is this medication? ALENDRONATE (a LEN droe nate) treats osteoporosis. It works by Paramedic stronger and less likely to break (fracture). It belongs to a group of medications called bisphosphonates. This medicine may be used for other purposes; ask your health care provider or pharmacist if you have questions. COMMON BRAND NAME(S): Fosamax What should I tell my care team before I take this medication? They need to know if you have any of these conditions: Bleeding disorder Cancer Dental disease Difficulty swallowing Infection (fever, chills, cough, sore throat, pain or trouble passing urine) Kidney disease Low levels of calcium or other minerals in the blood Low red blood cell counts Receiving steroids like dexamethasone or prednisone Stomach or intestine problems Trouble sitting or standing for 30 minutes An unusual or allergic reaction to alendronate, other medications, foods, dyes or preservatives Pregnant or trying to get pregnant Breast-feeding How should I use this medication? Take this medication by mouth with a full glass of water. Take it as directed on the prescription label at the same time every day. Use a specially marked oral syringe, spoon, or dropper to measure each dose. Ask your pharmacist if you do not have one. Household spoons are not accurate. Take the dose right after waking up. Do not eat or drink anything before taking it. Do not take it with any other drink except water. After taking it, do not eat breakfast, drink, or take any other medications or vitamins for at least 30 minutes. Sit or stand up for at least 30 minutes after you take it. Do not lie down. Keep taking it unless your care team tells you to stop. A special MedGuide will be given to you by the pharmacist with each prescription and refill. Be sure to read this information carefully each time. Talk to your care team about the use of this medication in children. Special  care may be needed. Overdosage: If you think you have taken too much of this medicine contact a poison control center or emergency room at once. NOTE: This medicine is only for you. Do not share this medicine with others. What if I miss a dose? If you take your medication once a day, skip it. Take your next dose at the scheduled time the next morning. Do not take two doses on the same day. If you take your medication once a week, take the missed dose on the morning after you remember. Do not take two doses on the same day. What may interact with this medication? Aluminum hydroxide Antacids Aspirin Calcium supplements Iron supplements Magnesium supplements Medications for inflammation like ibuprofen, naproxen, and others Vitamins with minerals This list may not describe all possible interactions. Give your health care provider a list of all the medicines, herbs, non-prescription drugs, or dietary supplements you use. Also tell them if you smoke, drink alcohol, or use illegal drugs. Some items may interact with your medicine. What should I watch for while using this medication? Visit your care team for regular checks on your progress. It may be some time before you see the benefit from this medication. Some people who take this medication have severe bone, joint, or muscle pain. This medication may also increase your risk for jaw problems or a broken thigh bone. Tell your care team right away if you have severe pain in your jaw, bones, joints, or muscles. Tell you care team if you have any pain that does not go away or that gets worse. Tell your dentist  and dental surgeon that you are taking this medication. You should not have major dental surgery while on this medication. See your dentist to have a dental exam and fix any dental problems before starting this medication. Take good care of your teeth while on this medication. Make sure you see your dentist for regular follow-up appointments. You  should make sure you get enough calcium and vitamin D while you are taking this medication. Discuss the foods you eat and the vitamins you take with your care team. You may need blood work done while you are taking this medication. What side effects may I notice from receiving this medication? Side effects that you should report to your care team as soon as possible: Allergic reactions--skin rash, itching, hives, swelling of the face, lips, tongue, or throat Low calcium level--muscle pain or cramps, confusion, tingling, or numbness in the hands or feet Osteonecrosis of the jaw--pain, swelling, or redness in the mouth, numbness of the jaw, poor healing after dental work, unusual discharge from the mouth, visible bones in the mouth Pain or trouble swallowing Severe bone, joint, or muscle pain Stomach bleeding--bloody or black, tar-like stools, vomiting blood or brown material that looks like coffee grounds Side effects that usually do not require medical attention (report to your care team if they continue or are bothersome): Constipation Diarrhea Nausea Stomach pain This list may not describe all possible side effects. Call your doctor for medical advice about side effects. You may report side effects to FDA at 1-800-FDA-1088. Where should I keep my medication? Keep out of the reach of children and pets. Store at room temperature between 20 and 25 degrees C (68 and 77 degrees F). Do not freeze. Throw away any unused medication after the expiration date. NOTE: This sheet is a summary. It may not cover all possible information. If you have questions about this medicine, talk to your doctor, pharmacist, or health care provider.  2022 Elsevier/Gold Standard (2020-10-13 00:00:00)

## 2021-09-07 NOTE — Patient Instructions (Signed)
Visit Information It was great speaking with you today!  Please let me know if you have any questions about our visit.   Goals Addressed             This Visit's Progress    Manage My Medicine       Timeframe:  Long-Range Goal Priority:  High Start Date:                             Expected End Date:                       Follow Up Date 11/30/2021    - call for medicine refill 2 or 3 days before it runs out - keep a list of all the medicines I take; vitamins and herbals too - use a pillbox to sort medicine - use an alarm clock or phone to remind me to take my medicine    Why is this important?   These steps will help you keep on track with your medicines.   Notes:  Please continue to follow up closely         Patient Care Plan: CCM Pharmacy Care Plan     Problem Identified: HTN, DM II      Long-Range Goal: Disease Management   Note:     Current Barriers:  Unable to independently monitor therapeutic efficacy  Pharmacist Clinical Goal(s):  Patient will achieve adherence to monitoring guidelines and medication adherence to achieve therapeutic efficacy through collaboration with PharmD and provider.   Interventions: 1:1 collaboration with Glendale Chard, MD regarding development and update of comprehensive plan of care as evidenced by provider attestation and co-signature Inter-disciplinary care team collaboration (see longitudinal plan of care) Comprehensive medication review performed; medication list updated in electronic medical record  Hypertension (BP goal <130/80) -Controlled -Current treatment: Amlodipine 5 mg tablet once per day Propranolol 20 mg tablet twice per day -Current home readings: patient is not currently checking her BP at home  -Current dietary habits: She does not eat lunch -Current exercise habits: recommend that patient continue with current exercise  -Denies hypotensive/hypertensive symptoms -Educated on BP goals and benefits of  medications for prevention of heart attack, stroke and kidney damage; Symptoms of hypotension and importance of maintaining adequate hydration; -Counseled to monitor BP at home at least once per week, document, and provide log at future appointments -Recommended to continue current medication  Diabetes (A1c goal <7%) -Controlled -Current medications: Humalog 200 unit/ml - inject 6 units two times daily before a meal  Tresiba 200 unit/ml - inject 42 units at night  -Current home glucose readings fasting glucose: 138,127 post prandial glucose: 274  -Denies hypoglycemic/hyperglycemic symptoms -Current meal patterns:  breakfast: oatmeal   dinner: she eats a full course meal with green, starch and meat  drinks: about 5-6 bottles of water per day  -Current exercise: she does things around the house and she works part time. 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  -Educated on A1c and blood sugar goals; Complications of diabetes including kidney damage, retinal damage, and cardiovascular disease; -Counseled to check feet daily and get yearly eye exams -Recommended to continue current medication  Health Maintenance -Vaccine gaps: COVID-19 Vaccine, Shingrix vaccine  -Recommended to continue current medication   Follow Up Plan: The patient has been provided with contact information for the care management team and has been advised to call with  any health related questions or concerns.       Patient agreed to services and verbal consent obtained.   The patient verbalized understanding of instructions, educational materials, and care plan provided today and agreed to receive a mailed copy of patient instructions, educational materials, and care plan.   [name], PharmD Clinical Pharmacist [facility] [phone]

## 2021-09-07 NOTE — Telephone Encounter (Signed)
   Telephone encounter was:  Unsuccessful.  09/07/2021 Name: Rennae Ferraiolo MRN: [id number] DOB: 1956-09-02  Unsuccessful outbound call made today to assist with:  Food Insecurity  Outreach Attempt:  1st Attempt  Unable to reach pt. Mailbox is full for phone number [phone]. For phone number [phone] system stated call could not be completed and to try again later then disconnected.  [facility] management  [place], [place]  Main Phone: [phone]  E-mail: [email or web address]  Website: [email or web address]

## 2021-09-12 LAB — COMPLETE METABOLIC PANEL WITH GFR
AG Ratio: 1 (calc) (ref 1.0–2.5)
ALT: 6 U/L (ref 6–29)
AST: 14 U/L (ref 10–35)
Albumin: 3.7 g/dL (ref 3.6–5.1)
Alkaline phosphatase (APISO): 157 U/L — ABNORMAL HIGH (ref 37–153)
BUN: 20 mg/dL (ref 7–25)
CO2: 26 mmol/L (ref 20–32)
Calcium: 8.9 mg/dL (ref 8.6–10.4)
Chloride: 107 mmol/L (ref 98–110)
Creat: 0.83 mg/dL (ref 0.50–1.05)
Globulin: 3.6 g/dL (calc) (ref 1.9–3.7)
Glucose, Bld: 86 mg/dL (ref 65–99)
Potassium: 4.2 mmol/L (ref 3.5–5.3)
Sodium: 141 mmol/L (ref 135–146)
Total Bilirubin: 0.6 mg/dL (ref 0.2–1.2)
Total Protein: 7.3 g/dL (ref 6.1–8.1)
eGFR: 78 mL/min/{1.73_m2} (ref >=60)

## 2021-09-12 LAB — PROTEIN ELECTROPHORESIS, SERUM, WITH REFLEX
Albumin ELP: 3.5 g/dL — ABNORMAL LOW (ref 3.8–4.8)
Alpha 1: 0.3 g/dL (ref 0.2–0.3)
Alpha 2: 0.6 g/dL (ref 0.5–0.9)
Beta 2: 0.4 g/dL (ref 0.2–0.5)
Beta Globulin: 0.5 g/dL (ref 0.4–0.6)
Gamma Globulin: 1.7 g/dL (ref 0.8–1.7)
Total Protein: 7.1 g/dL (ref 6.1–8.1)

## 2021-09-12 LAB — PARATHYROID HORMONE, INTACT (NO CA): PTH: 93 pg/mL — ABNORMAL HIGH (ref 16–77)

## 2021-09-12 LAB — TSH: TSH: 1.18 mIU/L (ref 0.40–4.50)

## 2021-09-12 LAB — VITAMIN D 25 HYDROXY (VIT D DEFICIENCY, FRACTURES): Vit D, 25-Hydroxy: 33 ng/mL (ref 30–100)

## 2021-09-12 NOTE — Progress Notes (Signed)
Kathryn Walsh, I noted that patient has chronic elevation of alkaline phosphatase.  I am uncertain if patient had any work-up for elevated alkaline phosphatase in the past.  I could not reach the patient.  Please advise. Thank you, Abel Presto

## 2021-09-14 ENCOUNTER — Other Ambulatory Visit: Payer: Self-pay

## 2021-09-14 ENCOUNTER — Ambulatory Visit: Payer: Medicare Other | Admitting: Physician Assistant

## 2021-09-14 ENCOUNTER — Encounter: Payer: Self-pay | Admitting: Physician Assistant

## 2021-09-14 ENCOUNTER — Telehealth: Payer: Self-pay

## 2021-09-14 VITALS — BP 174/72 | HR 84 | Resp 16 | Ht 66.0 in | Wt 177.0 lb

## 2021-09-14 DIAGNOSIS — M816 Localized osteoporosis [Lequesne]: Secondary | ICD-10-CM | POA: Diagnosis not present

## 2021-09-14 DIAGNOSIS — Z5181 Encounter for therapeutic drug level monitoring: Secondary | ICD-10-CM

## 2021-09-14 DIAGNOSIS — I10 Essential (primary) hypertension: Secondary | ICD-10-CM

## 2021-09-14 DIAGNOSIS — Z8619 Personal history of other infectious and parasitic diseases: Secondary | ICD-10-CM | POA: Diagnosis not present

## 2021-09-14 DIAGNOSIS — R748 Abnormal levels of other serum enzymes: Secondary | ICD-10-CM

## 2021-09-14 DIAGNOSIS — F17210 Nicotine dependence, cigarettes, uncomplicated: Secondary | ICD-10-CM | POA: Diagnosis not present

## 2021-09-14 DIAGNOSIS — M5441 Lumbago with sciatica, right side: Secondary | ICD-10-CM

## 2021-09-14 DIAGNOSIS — G8929 Other chronic pain: Secondary | ICD-10-CM

## 2021-09-14 DIAGNOSIS — Z8659 Personal history of other mental and behavioral disorders: Secondary | ICD-10-CM

## 2021-09-14 DIAGNOSIS — E559 Vitamin D deficiency, unspecified: Secondary | ICD-10-CM

## 2021-09-14 DIAGNOSIS — Z8639 Personal history of other endocrine, nutritional and metabolic disease: Secondary | ICD-10-CM

## 2021-09-14 DIAGNOSIS — G894 Chronic pain syndrome: Secondary | ICD-10-CM

## 2021-09-14 DIAGNOSIS — Z944 Liver transplant status: Secondary | ICD-10-CM | POA: Diagnosis not present

## 2021-09-14 MED ORDER — ALENDRONATE SODIUM 70 MG PO TABS: 70.0000 mg | ORAL_TABLET | ORAL | 0 refills | Status: DC

## 2021-09-14 NOTE — Progress Notes (Signed)
Office Visit Note  Patient: Kathryn Walsh             Date of Birth: Feb 15, 1956           MRN: [id number]             PCP: Glendale Chard, MD Referring: Glendale Chard, MD Visit Date: 09/14/2021 Occupation: _0 @  Subjective:  Discussed lab work  History of Present Illness: Kathryn Walsh is a 65 y.o. female with history of osteoporosis.  Patient presents today to discuss lab work obtained at the first office visit as well as further discuss proceeding with Fosamax.  She denies any new questions or concerns since her last office visit.  She continues to have lower back pain and is awaiting an appointment with a spine specialist after the referral was placed after her last office visit.  She denies any symptoms of radiculopathy at this time.  She denies any recent falls or fractures.  She is not taking calcium or vitamin D at this time.  Activities of Daily Living:  Patient reports morning stiffness for 0  none .   Patient Reports nocturnal pain.  Difficulty dressing/grooming: Denies Difficulty climbing stairs: Reports Difficulty getting out of chair: Denies Difficulty using hands for taps, buttons, cutlery, and/or writing: Denies  Review of Systems  Constitutional:  Negative for fatigue.  HENT:  Negative for mouth dryness.   Eyes:  Negative for dryness.  Respiratory:  Negative for shortness of breath.   Cardiovascular:  Negative for swelling in legs/feet.  Gastrointestinal:  Negative for constipation.  Endocrine: Negative for excessive thirst.  Genitourinary:  Negative for difficulty urinating.  Musculoskeletal:  Positive for joint pain, joint pain, muscle weakness and muscle tenderness.  Skin:  Negative for rash.  Allergic/Immunologic: Negative for susceptible to infections.  Neurological:  Negative for numbness.  Hematological:  Negative for bruising/bleeding tendency.  Psychiatric/Behavioral:  Negative for sleep disturbance.    PMFS History:  Patient Active  Problem List   Diagnosis Date Noted   Cigarette nicotine dependence without complication 59/56/3875   Pap smear for cervical cancer screening 01/04/2014   Chronic pain syndrome 01/04/2014   Diabetes ([place]) 10/06/2013   Depression 10/06/2013   Hyperglycemia 07/31/2013   Cellulitis and abscess 07/30/2013   Immunosuppressed status ([place]) 07/30/2013   Essential hypertension 07/22/2013   S/P liver transplant ([place]) 07/11/2013   HCV (hepatitis C virus) 07/11/2013   Type II diabetes mellitus, uncontrolled 07/11/2013    Past Medical History:  Diagnosis Date   Diabetes mellitus without complication ([place])    High cholesterol    HTN (hypertension)    Liver transplant recipient [facility])     Family History  Problem Relation Age of Onset   Healthy Mother    Healthy Father    Heart disease Sister    Heart disease Brother    Gout Brother    Heart disease Brother    Healthy Daughter    Diabetes Son    Past Surgical History:  Procedure Laterality Date   LIVER TRANSPLANTATION     Social History   Social History Narrative   Not on file   Immunization History  Administered Date(s) Administered   DTaP 06/10/2012   Influenza, High Dose Seasonal PF 08/16/2021   Influenza,inj,Quad PF,6+ Mos 10/06/2013, 08/12/2015, 08/01/2018, 07/02/2019   Influenza-Unspecified 08/18/2012, 08/06/2018, 07/26/2020   Moderna Sars-Covid-2 Vaccination 11/03/2019, 12/01/2019, 07/26/2020   Pfizer Covid-19 Vaccine Bivalent Booster 15yr & up 09/02/2021   Pneumococcal Conjugate-13 08/12/2015   Pneumococcal Polysaccharide-23 07/13/2013, 12/16/2018  Objective: Vital Signs: BP (!) 174/72 (BP Location: Left Arm, Patient Position: Sitting, Cuff Size: Normal)   Pulse 84   Resp 16   Ht _0  (1.676 m)   Wt 177 lb (80.3 kg)   BMI 28.57 kg/m    Physical Exam Vitals and nursing note reviewed.  Constitutional:      Appearance: She is well-developed.  HENT:     Head: Normocephalic and atraumatic.  Eyes:      Conjunctiva/sclera: Conjunctivae normal.  Pulmonary:     Effort: Pulmonary effort is normal.  Abdominal:     Palpations: Abdomen is soft.  Musculoskeletal:     Cervical back: Normal range of motion.  Skin:    General: Skin is warm and dry.     Capillary Refill: Capillary refill takes less than 2 seconds.  Neurological:     Mental Status: She is alert and oriented to person, place, and time.  Psychiatric:        Behavior: Behavior normal.     Musculoskeletal Exam: C-spine has good range of motion with no discomfort.  Painful range of motion of lumbar spine.  Shoulder joints, but joints, wrist joints, MCPs, PIPs, DIPs have good range of motion with no synovitis.  Complete fist formation bilaterally.  Hip joints have good range of motion with no discomfort.  Knee joints have good range of motion with no warmth or effusion.  Ankle joints have good range of motion with no tenderness or joint swelling.  Muscular atrophy of the right quadricep muscles noted.  CDAI Exam: CDAI Score: -- Patient Global: --; Provider Global: -- Swollen: --; Tender: -- Joint Exam 09/14/2021   No joint exam has been documented for this visit   There is currently no information documented on the homunculus. Go to the Rheumatology activity and complete the homunculus joint exam.  Investigation: No additional findings.  Imaging: No results found.  Recent Labs: Lab Results  Component Value Date   WBC 4.8 04/20/2021   HGB 11.9 04/20/2021   PLT 115 (L) 04/20/2021   NA 141 09/07/2021   K 4.2 09/07/2021   CL 107 09/07/2021   CO2 26 09/07/2021   GLUCOSE 86 09/07/2021   BUN 20 09/07/2021   CREATININE 0.83 09/07/2021   BILITOT 0.6 09/07/2021   ALKPHOS 180 (H) 04/20/2021   AST 14 09/07/2021   ALT 6 09/07/2021   PROT 7.3 09/07/2021   PROT 7.1 09/07/2021   ALBUMIN 3.9 04/20/2021   CALCIUM 8.9 09/07/2021   GFRAA 109 10/12/2020    Speciality Comments: No specialty comments available.  Procedures:  No  procedures performed Allergies: Patient has no known allergies.   Assessment / Plan:     Visit Diagnoses: Localized osteoporosis without current pathological fracture - DEXA: 07/06/21: The BMD measured at Forearm Radius 33% is 0.542 g/cm2 with a T-scoreof -3.9. Naive to tx. Hx of systemic steriod use.  DEXA results were discussed in detail at her initial office visit with Dr. Estanislado Pandy on 09/07/2021.  The plan was to start the patient on Fosamax pending lab results obtained on 09/07/2021.  Lab results were reviewed with the patient today in detail: Creatinine 0.83, GFR 78, calcium 8.9, vitamin D 33, TSH 1.18, PTH 93, alk phos borderline elevated 157, and SPEP did not reveal any abnormal protein bands.  Results were reviewed with Dr. Estanislado Pandy who recommended proceeding with Fosamax as previously discussed. Discussed the importance of taking a calcium and vitamin D supplement on a daily basis.  She should  try to obtain calcium 1200 mg daily and vitamin D at least 1000 units daily.  She was given a handout of calcium rich foods to follow. Discussed the importance of resistive and weightbearing exercises.  I also discussed a referral to physical therapy for lower extremity muscle strengthening and fall prevention but she declined at this time. Indications, contraindications, and potential side effects of Fosamax were discussed.  All questions were addressed and a prescription was sent to the pharmacy today.  She will start Fosamax 70 mg 1 tablet by mouth once weekly which she will take on an empty stomach first thing in the morning while sitting upright for at least 30 minutes.  She was advised to avoid ingesting any other foods, drinks, or medications for at least 60 minutes.  She was advised to notify us if she cannot tolerate taking Fosamax.  Discussed the risk of osteonecrosis of the jaw.  She does not have any upcoming invasive dental procedures.  She has dentures. She will follow-up in the office in about 6  weeks to assess her response and tolerability to Fosamax.  We discussed that we will repeat DEXA in 2 years.  Vitamin D deficiency - Vitamin D was 33 on 09/07/21.  She was strongly encouraged to take vitamin D 1000 units daily.  Medication monitoring encounter: CMP updated on 09/07/2021.  Creatinine and GFR within normal limits at that time.  Repeat lab work in 6 weeks at her follow-up visit.  Elevated alkaline phosphatase level - Alk phos was 157 on 09/07/21.  Alk phos has trended down and is only borderline positive at this time.  Chronic midline low back pain with right-sided sciatica: Patient was referred to Dr. Louanne Skye after the last office visit on 09/07/2021 and has an upcoming visit scheduled.  Discussed the importance of core strengthening and back exercises.  She declined physical therapy at this time.  Other medical conditions are listed as follows:  S/P liver transplant ([place]) -Followed at [facility].  She is taking Imuran 100 mg daily.  Hx of systemic steriod use.  History of hepatitis C  Essential hypertension  History of type 2 diabetes mellitus  Chronic pain syndrome  History of depression  Cigarette nicotine dependence without complication:  3/4 PPDx 40 years.  Strong association of smoking with osteoporosis was discussed.  Orders: No orders of the defined types were placed in this encounter.  Meds ordered this encounter  Medications   alendronate (FOSAMAX) 70 MG tablet    Sig: Take 1 tablet (70 mg total) by mouth once a week. Take with a full glass of water on an empty stomach.    Dispense:  12 tablet    Refill:  0     Follow-Up Instructions: Return in about 6 weeks (around 10/26/2021) for Osteoporosis.   Ofilia Neas, PA-C  Note - This record has been created using Dragon software.  Chart creation errors have been sought, but may not always  have been located. Such creation errors do not reflect on  the standard of medical care.

## 2021-09-14 NOTE — Telephone Encounter (Signed)
   Telephone encounter was:  Successful.  09/14/2021 Name: Izela Altier MRN: [id number] DOB: 03/13/1956  Nakeda Lebron is a 65 y.o. year old female who is a primary care patient of Glendale Chard, MD . The community resource team was consulted for assistance with Jeff guide performed the following interventions:  Pt advised she is not behind on bills, she just need more income for food at times. Pt advised she already goes to 1 food pantry. I advised her I could send him more options regarding pantries in the area. She agreed on that and declined other services. She stated she does not need bill support or anything else and would like the pantry information to be sent in mail.  Follow Up Plan:  No further follow up planned at this time. The patient has been provided with needed resources. - Pantry information in [facility] has been sent to patient.  [place] management  [place], [place]  Main Phone: [phone]  E-mail: [email or web address]  Website: [email or web address]

## 2021-09-25 ENCOUNTER — Emergency Department
Admission: EM | Admit: 2021-09-25 | Discharge: 2021-09-25 | Disposition: A | Discharge status: Home / Self Care | Payer: Medicare Other | Source: Home / Self Care

## 2021-09-25 ENCOUNTER — Other Ambulatory Visit: Payer: Self-pay

## 2021-09-25 DIAGNOSIS — J101 Influenza due to other identified influenza virus with other respiratory manifestations: Secondary | ICD-10-CM

## 2021-09-25 DIAGNOSIS — R0602 Shortness of breath: Secondary | ICD-10-CM | POA: Diagnosis not present

## 2021-09-25 DIAGNOSIS — I4891 Unspecified atrial fibrillation: Secondary | ICD-10-CM | POA: Diagnosis not present

## 2021-09-25 DIAGNOSIS — T50995A Adverse effect of other drugs, medicaments and biological substances, initial encounter: Secondary | ICD-10-CM | POA: Diagnosis not present

## 2021-09-25 DIAGNOSIS — J9600 Acute respiratory failure, unspecified whether with hypoxia or hypercapnia: Secondary | ICD-10-CM | POA: Diagnosis not present

## 2021-09-25 DIAGNOSIS — F17210 Nicotine dependence, cigarettes, uncomplicated: Secondary | ICD-10-CM | POA: Diagnosis not present

## 2021-09-25 DIAGNOSIS — E785 Hyperlipidemia, unspecified: Secondary | ICD-10-CM | POA: Diagnosis not present

## 2021-09-25 DIAGNOSIS — Z794 Long term (current) use of insulin: Secondary | ICD-10-CM | POA: Diagnosis not present

## 2021-09-25 DIAGNOSIS — R7989 Other specified abnormal findings of blood chemistry: Secondary | ICD-10-CM | POA: Diagnosis not present

## 2021-09-25 DIAGNOSIS — Z79899 Other long term (current) drug therapy: Secondary | ICD-10-CM | POA: Diagnosis not present

## 2021-09-25 DIAGNOSIS — D649 Anemia, unspecified: Secondary | ICD-10-CM | POA: Diagnosis not present

## 2021-09-25 DIAGNOSIS — I517 Cardiomegaly: Secondary | ICD-10-CM | POA: Diagnosis not present

## 2021-09-25 DIAGNOSIS — I10 Essential (primary) hypertension: Secondary | ICD-10-CM | POA: Diagnosis not present

## 2021-09-25 DIAGNOSIS — Z79624 Long term (current) use of inhibitors of nucleotide synthesis: Secondary | ICD-10-CM | POA: Diagnosis not present

## 2021-09-25 DIAGNOSIS — Z20822 Contact with and (suspected) exposure to covid-19: Secondary | ICD-10-CM | POA: Diagnosis not present

## 2021-09-25 DIAGNOSIS — E611 Iron deficiency: Secondary | ICD-10-CM | POA: Diagnosis not present

## 2021-09-25 DIAGNOSIS — Z7982 Long term (current) use of aspirin: Secondary | ICD-10-CM | POA: Diagnosis not present

## 2021-09-25 DIAGNOSIS — E119 Type 2 diabetes mellitus without complications: Secondary | ICD-10-CM | POA: Diagnosis not present

## 2021-09-25 DIAGNOSIS — D84821 Immunodeficiency due to drugs: Secondary | ICD-10-CM | POA: Diagnosis not present

## 2021-09-25 DIAGNOSIS — B349 Viral infection, unspecified: Secondary | ICD-10-CM | POA: Diagnosis not present

## 2021-09-25 DIAGNOSIS — K449 Diaphragmatic hernia without obstruction or gangrene: Secondary | ICD-10-CM | POA: Diagnosis not present

## 2021-09-25 DIAGNOSIS — E7800 Pure hypercholesterolemia, unspecified: Secondary | ICD-10-CM | POA: Diagnosis not present

## 2021-09-25 DIAGNOSIS — J9801 Acute bronchospasm: Secondary | ICD-10-CM | POA: Diagnosis not present

## 2021-09-25 DIAGNOSIS — D696 Thrombocytopenia, unspecified: Secondary | ICD-10-CM | POA: Diagnosis not present

## 2021-09-25 DIAGNOSIS — I071 Rheumatic tricuspid insufficiency: Secondary | ICD-10-CM | POA: Diagnosis not present

## 2021-09-25 DIAGNOSIS — Z944 Liver transplant status: Secondary | ICD-10-CM | POA: Diagnosis not present

## 2021-09-25 DIAGNOSIS — E1165 Type 2 diabetes mellitus with hyperglycemia: Secondary | ICD-10-CM | POA: Diagnosis not present

## 2021-09-25 DIAGNOSIS — D6959 Other secondary thrombocytopenia: Secondary | ICD-10-CM | POA: Diagnosis not present

## 2021-09-25 LAB — POC INFLUENZA A AND B ANTIGEN (URGENT CARE ONLY)
Influenza A Ag: POSITIVE — AB
Influenza B Ag: NEGATIVE

## 2021-09-25 MED ORDER — OSELTAMIVIR PHOSPHATE 75 MG PO CAPS: 75.0000 mg | ORAL_CAPSULE | Freq: Two times a day (BID) | ORAL | 0 refills | Status: DC

## 2021-09-25 NOTE — ED Provider Notes (Signed)
Kathryn Walsh CARE    CSN: [id number] Arrival date & time: 09/25/21  1833      History   Chief Complaint Chief Complaint  Patient presents with   Cough   Shortness of Breath   Headache    HPI Kathryn Walsh is a 65 y.o. female.   HPI 65 year old female presents with cough, shortness of breath and on Friday of last week.  PMH significant for ImmunoPro status, s/p liver transplant, HTN, and T2DM.  Patient has tested positive for influenza A and on exam EKG found atrial fibrillation with rapid ventricular response.  Past Medical History:  Diagnosis Date   Diabetes mellitus without complication ([place])    High cholesterol    HTN (hypertension)    Liver transplant recipient [facility])     Patient Active Problem List   Diagnosis Date Noted   Cigarette nicotine dependence without complication 09/62/8366   Pap smear for cervical cancer screening 01/04/2014   Chronic pain syndrome 01/04/2014   Diabetes ([place]) 10/06/2013   Depression 10/06/2013   Hyperglycemia 07/31/2013   Cellulitis and abscess 07/30/2013   Immunosuppressed status ([place]) 07/30/2013   Essential hypertension 07/22/2013   S/P liver transplant ([place]) 07/11/2013   HCV (hepatitis C virus) 07/11/2013   Type II diabetes mellitus, uncontrolled 07/11/2013    Past Surgical History:  Procedure Laterality Date   LIVER TRANSPLANTATION      OB History   No obstetric history on file.      Home Medications    Prior to Admission medications   Medication Sig Start Date End Date Taking? Authorizing Provider  oseltamivir (TAMIFLU) 75 MG capsule Take 1 capsule (75 mg total) by mouth every 12 (twelve) hours. 09/25/21  Yes Eliezer Lofts, FNP  alendronate (FOSAMAX) 70 MG tablet Take 1 tablet (70 mg total) by mouth once a week. Take with a full glass of water on an empty stomach. 09/14/21   Ofilia Neas, PA-C  amLODipine (NORVASC) 5 MG tablet TAKE 1 TABLET BY MOUTH EVERY DAY 03/31/21   Glendale Chard, MD  aspirin 81 MG  chewable tablet Chew 81 mg by mouth daily.    [provider]  azaTHIOprine (IMURAN) 50 MG tablet Take 100 mg by mouth 2 (two) times daily.     [provider]  Blood Glucose Monitoring Suppl (ACCU-CHEK GUIDE ME) w/Device KIT Use to check blood sugar 3 times a day. Dx code e11.65 07/28/20   Glendale Chard, MD  Cholecalciferol 25 MCG (1000 UT) capsule Take 4,000 Units by mouth daily. Patient not taking: Reported on 09/07/2021    [provider]  cyclobenzaprine (FLEXERIL) 10 MG tablet TAKE 1 TABLET BY MOUTH EVERYDAY AT BEDTIME Patient not taking: Reported on 09/07/2021 02/14/21   Bary Castilla, NP  glucose blood (ACCU-CHEK GUIDE) test strip Use as instructed to check blood sugar 3 times a day. Dx code e11.65 07/28/20   Glendale Chard, MD  insulin degludec (TRESIBA FLEXTOUCH) 200 UNIT/ML FlexTouch Pen Inject 42 Units into the skin. Pt doing 32 units qhs    [provider]  Insulin Lispro (HUMALOG KWIKPEN) 200 UNIT/ML SOPN Inject 6 Units into the skin 2 (two) times daily before a meal. Please inject 6 units before breakfast and dinner; max dose titration 40 units daily 07/02/19   Glendale Chard, MD  pravastatin (PRAVACHOL) 40 MG tablet TAKE 1 TABLET BY MOUTH EVERYDAY AT BEDTIME 06/22/21   Glendale Chard, MD  propranolol (INDERAL) 20 MG tablet Take 20 mg by mouth 2 (two) times daily.  Once daily    [provider]  tacrolimus (PROGRAF) 1 MG capsule Take 2 mg in am and 3 mg in pm 11/19/16   [provider]  Turmeric 500 MG TABS Take by mouth.    [provider]    Family History Family History  Problem Relation Age of Onset   Healthy Mother    Healthy Father    Heart disease Sister    Heart disease Brother    Gout Brother    Heart disease Brother    Healthy Daughter    Diabetes Son     Social History Social History   Tobacco Use   Smoking status: Some Days    Packs/day: 0.75    Years: 40.00    Pack years: 30.00    Types:  Cigarettes    Start date: 11/12/1974   Smokeless tobacco: Never   Tobacco comments:    encouraged to avoid smoking in the house and in her car  Vaping Use   Vaping Use: Never used  Substance Use Topics   Alcohol use: No   Drug use: No     Allergies   Patient has no known allergies.   Review of Systems Review of Systems  Constitutional:  Positive for fatigue.  Respiratory:  Positive for cough and shortness of breath.   Neurological:  Positive for headaches.  All other systems reviewed and are negative.   Physical Exam Triage Vital Signs ED Triage Vitals  Enc Vitals Group     BP 09/25/21 1937 138/85     Pulse Rate 09/25/21 1937 (!) 131     Resp 09/25/21 1937 14     Temp 09/25/21 1937 99.7 F (37.6 C)     Temp Source 09/25/21 1937 Oral     SpO2 09/25/21 1937 98 %     Weight --      Height --      Head Circumference --      Peak Flow --      Pain Score 09/25/21 1940 2     Pain Loc --      Pain Edu? --      Excl. in [place]? --    No data found.  Updated Vital Signs BP 138/85 (BP Location: Right Arm)   Pulse (!) 131   Temp 99.7 F (37.6 C) (Oral)   Resp 14   SpO2 98%      Physical Exam Vitals and nursing note reviewed.  Constitutional:      General: She is not in acute distress.    Appearance: Normal appearance. She is not ill-appearing.  HENT:     Head: Normocephalic and atraumatic.     Right Ear: Tympanic membrane, ear canal and external ear normal.     Left Ear: Tympanic membrane, ear canal and external ear normal.     Nose: Nose normal.     Mouth/Throat:     Mouth: Mucous membranes are moist.     Pharynx: Oropharynx is clear.  Eyes:     Extraocular Movements: Extraocular movements intact.     Conjunctiva/sclera: Conjunctivae normal.     Pupils: Pupils are equal, round, and reactive to light.  Neck:     Vascular: No carotid bruit.     Comments: No JVD noted Cardiovascular:     Pulses: Normal pulses.     Heart sounds: No murmur heard.   No  friction rub. No gallop.     Comments: Irregularly irregular, EKG revealed A. fib with  rapid ventricular response, left axis deviation, possible septal infarct age undetermined, abnormal EKG Pulmonary:     Effort: Pulmonary effort is normal.     Breath sounds: Normal breath sounds.  Musculoskeletal:     Cervical back: Normal range of motion and neck supple.  Neurological:     General: No focal deficit present.     Mental Status: She is alert and oriented to person, place, and time. Mental status is at baseline.     UC Treatments / Results  Labs (all labs ordered are listed, but only abnormal results are displayed) Labs Reviewed  POC INFLUENZA A AND B ANTIGEN (URGENT CARE ONLY) - Abnormal; Notable for the following components:      Result Value   Influenza A Ag Positive (*)    All other components within normal limits    EKG   Radiology No results found.  Procedures Procedures (including critical care time)  Medications Ordered in UC Medications - No data to display  Initial Impression / Assessment and Plan / UC Course  I have reviewed the triage vital signs and the nursing notes.  Pertinent labs & imaging results that were available during my care of the patient were reviewed by me and considered in my medical decision making (see chart for details).     MDM: 1.  Atrial fibrillation with rapid ventricular response-Instructed patient and daughter to go to [facility] ED now for further evaluation of heart arrhythmia.  2.  Influenza A-Rx'd Tamiflu. Advised patient and daughter that she is positive for influenza A and that we have sent Tamiflu to her pharmacy now which she may start tomorrow morning. Patient/daughter agreed and verbalized understanding of these instructions and this plan of care this evening.  Patient discharged, hemodynamically stable for now.  Staff has called ahead to [facility] ED to inform center  of patient's arrival. Final Clinical Impressions(s) / UC Diagnoses   Final diagnoses:  Atrial fibrillation with rapid ventricular response ([place])  Influenza A     Discharge Instructions      Instructed patient and daughter to go to [facility] ED now for further evaluation of heart arrhythmia.  Advised patient and daughter that she is positive for influenza A and that we have sent Tamiflu to her pharmacy now which she may start tomorrow morning. Patient/daughter agreed and verbalized understanding of these instructions and this plan of care this evening.       ED Prescriptions     Medication Sig Dispense Auth. Provider   oseltamivir (TAMIFLU) 75 MG capsule Take 1 capsule (75 mg total) by mouth every 12 (twelve) hours. 10 capsule Eliezer Lofts, FNP      PDMP not reviewed this encounter.   Eliezer Lofts, [place] 09/25/21 2017

## 2021-09-25 NOTE — ED Notes (Signed)
Patient is being discharged from the Urgent Care and sent to the Emergency Department via POV driven by daughter. Per Eliezer Lofts, NP patient is in need of higher level of care due to afib with RVR. Patient is aware and verbalizes understanding of plan of care.  Vitals:   09/25/21 1937  BP: 138/85  Pulse: (!) 131  Resp: 14  Temp: 99.7 F (37.6 C)  SpO2: 98%

## 2021-09-25 NOTE — ED Triage Notes (Signed)
Pt presents with cough, headache, and sob that began Friday.

## 2021-09-25 NOTE — Discharge Instructions (Addendum)
Instructed patient and daughter to go to [facility] ED now for further evaluation of heart arrhythmia.  Advised patient and daughter that she is positive for influenza A and that we have sent Tamiflu to her pharmacy now which she may start tomorrow morning. Patient/daughter agreed and verbalized understanding of these instructions and this plan of care this evening.

## 2021-09-27 DIAGNOSIS — I10 Essential (primary) hypertension: Secondary | ICD-10-CM

## 2021-09-27 DIAGNOSIS — E1165 Type 2 diabetes mellitus with hyperglycemia: Secondary | ICD-10-CM | POA: Diagnosis not present

## 2021-09-29 ENCOUNTER — Telehealth: Payer: Self-pay

## 2021-09-29 NOTE — Telephone Encounter (Signed)
Transition Care Management Follow-up Telephone Call Date of discharge and from where: 09/29/2021 [facility]  How have you been since you were released from the hospital? Pt states she is coming along, as  Any questions or concerns? No  Items Reviewed: Did the pt receive and understand the discharge instructions provided? Yes  Medications obtained and verified? Yes  Other? Yes  Any new allergies since your discharge? No  Dietary orders reviewed? Yes Do you have support at home? Yes   Home Care and Equipment/Supplies: Were home health services ordered? not applicable If so, what is the name of the agency? N/a  Has the agency set up a time to come to the patient's home? not applicable Were any new equipment or medical supplies ordered?  No What is the name of the medical supply agency? N/a Were you able to get the supplies/equipment? not applicable Do you have any questions related to the use of the equipment or supplies? No  Functional Questionnaire: (I = Independent and D = Dependent) ADLs: I/d  Bathing/Dressing- I/d  Meal Prep- I/d  Eating- I/d  Maintaining continence- I/d  Transferring/Ambulation- I/d  Managing Meds- I/d  Follow up appointments reviewed:  PCP Hospital f/u appt confirmed? Yes  Scheduled to see raman ghumman on 10/09/2021 @ 11:30 [facility]. [place] [facility] f/u appt confirmed? No  Scheduled to see n/a on n/a @ n/a. Are transportation arrangements needed? No  If their condition worsens, is the pt aware to call PCP or go to the Emergency Dept.? Yes Was the patient provided with contact information for the PCP's office or ED? Yes Was to pt encouraged to call back with questions or concerns? Yes

## 2021-10-04 ENCOUNTER — Ambulatory Visit: Payer: Medicare Other | Admitting: Rheumatology

## 2021-10-08 NOTE — Progress Notes (Signed)
We will discuss results at the follow-up visit.

## 2021-10-09 ENCOUNTER — Telehealth: Payer: Medicare Other | Admitting: Nurse Practitioner

## 2021-10-09 ENCOUNTER — Telehealth: Payer: Self-pay

## 2021-10-09 NOTE — Telephone Encounter (Signed)
Pt reports to having the flu, cancelling hospital f/u appointment for today. Pt states she will call back to resch. She was offered an appointment with nurse practitioner later today, pt declined.

## 2021-10-11 ENCOUNTER — Telehealth: Payer: Self-pay

## 2021-10-11 NOTE — Chronic Care Management (AMB) (Signed)
° ° °  Chronic Care Management Pharmacy Assistant   Name: Kathryn Walsh  MRN: [id number] DOB: 11/07/1955  Reason for Encounter: Patient assistance medication    Medications: Outpatient Encounter Medications as of 10/11/2021  Medication Sig   albuterol (VENTOLIN HFA) 108 (90 Base) MCG/ACT inhaler Inhale into the lungs.   alendronate (FOSAMAX) 70 MG tablet Take 1 tablet (70 mg total) by mouth once a week. Take with a full glass of water on an empty stomach. (Patient not taking: Reported on 09/29/2021)   amLODipine (NORVASC) 5 MG tablet TAKE 1 TABLET BY MOUTH EVERY DAY (Patient not taking: Reported on 09/29/2021)   apixaban (ELIQUIS) 5 MG TABS tablet Take by mouth.   aspirin 81 MG chewable tablet Chew 81 mg by mouth daily. (Patient not taking: Reported on 09/29/2021)   azaTHIOprine (IMURAN) 50 MG tablet Take 100 mg by mouth 2 (two) times daily.    Blood Glucose Monitoring Suppl (ACCU-CHEK GUIDE ME) w/Device KIT Use to check blood sugar 3 times a day. 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 (Patient not taking: Reported on 09/07/2021)   cyclobenzaprine (FLEXERIL) 10 MG tablet TAKE 1 TABLET BY MOUTH EVERYDAY AT BEDTIME (Patient not taking: Reported on 09/07/2021)   diltiazem (TIAZAC) 240 MG 24 hr capsule Take by mouth.   glucose blood (ACCU-CHEK GUIDE) test strip Use as instructed to check blood sugar 3 times a day. 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 Pt doing 32 units qhs   Insulin Lispro (HUMALOG KWIKPEN) 200 UNIT/ML SOPN Inject 6 Units into the skin 2 (two) times daily before a meal. Please inject 6 units before breakfast and dinner; max dose titration 40 units daily   oseltamivir (TAMIFLU) 75 MG capsule Take 1 capsule (75 mg total) by mouth every 12 (twelve) hours.   pravastatin (PRAVACHOL) 40 MG tablet TAKE 1 TABLET BY MOUTH EVERYDAY AT BEDTIME   propranolol (INDERAL)  20 MG tablet Take 20 mg by mouth 2 (two) times daily. Once daily   tacrolimus (PROGRAF) 1 MG capsule Take 2 mg in am and 3 mg in pm   Turmeric 500 MG TABS Take by mouth. (Patient not taking: Reported on 09/29/2021)   No facility-administered encounter medications on file as of 10/11/2021.   10-11-2021: Called patient to inform her that tresiba and needles are ready to be picked up. Patient states she will pick medication up today or tomorrow.  [place] Pharmacist Assistant [phone]

## 2021-10-12 NOTE — Progress Notes (Deleted)
Office Visit Note  Patient: Kathryn Walsh             Date of Birth: 1956/06/29           MRN: [id number]             PCP: Glendale Chard, MD Referring: Glendale Chard, MD Visit Date: 10/26/2021 Occupation: @GUAROCC @  Subjective:  Medication monitoring  History of Present Illness: Kathryn Walsh is a 65 y.o. female with history of osteoporosis and vitamin D deficiency.  She is taking Fosamax 70 mg 1 tablet by mouth weekly.  Activities of Daily Living:  Patient reports morning stiffness for *** {minute/hour:19697}.   Patient {ACTIONS;DENIES/REPORTS:21021675::"Denies"} nocturnal pain.  Difficulty dressing/grooming: {ACTIONS;DENIES/REPORTS:21021675::"Denies"} Difficulty climbing stairs: {ACTIONS;DENIES/REPORTS:21021675::"Denies"} Difficulty getting out of chair: {ACTIONS;DENIES/REPORTS:21021675::"Denies"} Difficulty using hands for taps, buttons, cutlery, and/or writing: {ACTIONS;DENIES/REPORTS:21021675::"Denies"}  No Rheumatology ROS completed.   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   Type II diabetes mellitus, uncontrolled 07/11/2013    Past Medical History:  Diagnosis Date   Diabetes mellitus without complication ([place])    High cholesterol    HTN (hypertension)    Liver transplant recipient [facility])     Family History  Problem Relation Age of Onset   Healthy Mother    Healthy Father    Heart disease Sister    Heart disease Brother    Gout Brother    Heart disease Brother    Healthy Daughter     Diabetes Son    Past Surgical History:  Procedure Laterality Date   LIVER TRANSPLANTATION     Social History   Social History Narrative   Not on file   Immunization History  Administered Date(s) Administered   DTaP 06/10/2012   Influenza, High Dose Seasonal PF 08/16/2021   Influenza,inj,Quad PF,6+ Mos 10/06/2013, 08/12/2015, 08/01/2018, 07/02/2019   Influenza-Unspecified 08/18/2012, 08/06/2018, 07/26/2020   Moderna Sars-Covid-2 Vaccination 11/03/2019, 12/01/2019, 07/26/2020   Pfizer Covid-19 Vaccine Bivalent Booster 31yrs & up 09/02/2021   Pneumococcal Conjugate-13 08/12/2015   Pneumococcal Polysaccharide-23 07/13/2013, 12/16/2018     Objective: Vital Signs: There were no vitals taken for this visit.   Physical Exam Vitals and nursing note reviewed.  Constitutional:      Appearance: She is well-developed.  HENT:     Head: Normocephalic and atraumatic.  Eyes:     Conjunctiva/sclera: Conjunctivae normal.  Pulmonary:     Effort: Pulmonary effort is normal.  Abdominal:     Palpations: Abdomen is soft.  Musculoskeletal:     Cervical back: Normal range of motion.  Skin:    General: Skin is warm and dry.     Capillary Refill: Capillary refill takes less than 2 seconds.  Neurological:     Mental Status: She is alert and oriented to person, place, and time.  Psychiatric:        Behavior: Behavior normal.     Musculoskeletal Exam: ***  CDAI Exam: CDAI Score: -- Patient Global: --; Provider Global: -- Swollen: --; Tender: -- Joint Exam 10/26/2021   No joint exam has been documented for this visit   There is  currently no information documented on the homunculus. Go to the Rheumatology activity and complete the homunculus joint exam.  Investigation: No additional findings.  Imaging: No results found.  Recent Labs: Lab Results  Component Value Date   WBC 4.8 04/20/2021   HGB 11.9 04/20/2021   PLT 115 (L) 04/20/2021   NA 141 09/07/2021   K 4.2  09/07/2021   CL 107 09/07/2021   CO2 26 09/07/2021   GLUCOSE 86 09/07/2021   BUN 20 09/07/2021   CREATININE 0.83 09/07/2021   BILITOT 0.6 09/07/2021   ALKPHOS 180 (H) 04/20/2021   AST 14 09/07/2021   ALT 6 09/07/2021   PROT 7.3 09/07/2021   PROT 7.1 09/07/2021   ALBUMIN 3.9 04/20/2021   CALCIUM 8.9 09/07/2021   GFRAA 109 10/12/2020    Speciality Comments: No specialty comments available.  Procedures:  No procedures performed Allergies: Patient has no known allergies.   Assessment / Plan:     Visit Diagnoses: No diagnosis found.  Orders: No orders of the defined types were placed in this encounter.  No orders of the defined types were placed in this encounter.   Face-to-face time spent with patient was *** minutes. Greater than 50% of time was spent in counseling and coordination of care.  Follow-Up Instructions: No follow-ups on file.   Earnestine Mealing, CMA  Note - This record has been created using Editor, commissioning.  Chart creation errors have been sought, but may not always  have been located. Such creation errors do not reflect on  the standard of medical care.

## 2021-10-26 ENCOUNTER — Ambulatory Visit: Payer: Medicare Other | Admitting: Physician Assistant

## 2021-10-27 ENCOUNTER — Telehealth: Payer: Self-pay

## 2021-10-27 NOTE — Progress Notes (Signed)
° ° °  Chronic Care Management Pharmacy Assistant   Name: Kathryn Walsh  MRN: [id number] DOB: 01-18-1956     Reason for Encounter: Rescheduled patient appointment from 11/30/21 to 12/01/21. Provider schedule changed.Called patient left message.      [place] Pharmacist Assistant [phone]

## 2021-11-01 ENCOUNTER — Ambulatory Visit: Payer: Medicare Other | Admitting: Specialist

## 2021-11-24 ENCOUNTER — Telehealth: Payer: Self-pay

## 2021-11-24 NOTE — Chronic Care Management (AMB) (Addendum)
Chronic Care Management Pharmacy Assistant   Name: Kathryn Walsh  MRN: [id number] DOB: November 02, 1955  Reason for Encounter: Disease State/ Hypertension  Recent office visits:  None  Recent consult visits:  09-14-2021 Su Monks (Rheumatology). START Fosamax 70 mg once a week. Take with a full glass of water on an empty stomach. Follow up in 6 weeks.  09-07-2021 Bo Merino, MD (Rheumatology). Alkaline phosphatase= 157. PTH= 93. Albumin ELP= 3.5. Referral to orthopedics.  Hospital visits:  Medication Reconciliation was completed by comparing discharge summary, patients EMR and Pharmacy list, and upon discussion with patient.  Admitted to the hospital on 09-25-2021 due to Atrial fibrillation with rapid ventricular response Discharge date was 09-25-2021 Discharged from [facility].  New?Medications Started at [facility] Discharge:?? Tamiflu 75 mg every 12 hours  Medication Changes at Hospital Discharge: None  Medications Discontinued at Hospital Discharge: None  Medications that remain the same after Hospital Discharge:??  -All other medications will remain the same.    Hospital visits:  Medication Reconciliation was completed by comparing discharge summary, patients EMR and Pharmacy list, and upon discussion with patient.  Admitted to the hospital on 09-25-2021 due to Influenza Atrial fibrillation. Discharge date was 09-28-2021 Discharged from [facility].  New?Medications Started at [facility] Discharge:?? Albuterol inhaler 108 (90 Base) MCG/ACT Inhale two puffs into the lungs every 4 (four) hours as needed for Wheezing or Shortness of Breath. Eliquis 5 mg twice daily Diltiazem HCl 240 mg daily Prednisone 20 mg tablet Take three tablets (60 mg dose) by mouth daily for 2 days, THEN two tablets (40 mg dose) daily for 2 days, THEN one tablet (20 mg dose) daily for 2 days, THEN one half tablet (10 mg  dose) daily for 2 days.   Medication Changes at Hospital Discharge: None  Medications Discontinued at Hospital Discharge: Norvasc 5 mg Aspirin 81 mg  Medications that remain the same after Hospital Discharge:??  -All other medications will remain the same.     Medications: Outpatient Encounter Medications as of 11/24/2021  Medication Sig   albuterol (VENTOLIN HFA) 108 (90 Base) MCG/ACT inhaler Inhale into the lungs.   alendronate (FOSAMAX) 70 MG tablet Take 1 tablet (70 mg total) by mouth once a week. Take with a full glass of water on an empty stomach. (Patient not taking: Reported on 09/29/2021)   amLODipine (NORVASC) 5 MG tablet TAKE 1 TABLET BY MOUTH EVERY DAY (Patient not taking: Reported on 09/29/2021)   apixaban (ELIQUIS) 5 MG TABS tablet Take by mouth.   aspirin 81 MG chewable tablet Chew 81 mg by mouth daily. (Patient not taking: Reported on 09/29/2021)   azaTHIOprine (IMURAN) 50 MG tablet Take 100 mg by mouth 2 (two) times daily.    Blood Glucose Monitoring Suppl (ACCU-CHEK GUIDE ME) w/Device KIT Use to check blood sugar 3 times a day. 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 (Patient not taking: Reported on 09/07/2021)   cyclobenzaprine (FLEXERIL) 10 MG tablet TAKE 1 TABLET BY MOUTH EVERYDAY AT BEDTIME (Patient not taking: Reported on 09/07/2021)   diltiazem (TIAZAC) 240 MG 24 hr capsule Take by mouth.   glucose blood (ACCU-CHEK GUIDE) test strip Use as instructed to check blood sugar 3 times a day. 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 Pt doing 32 units qhs   Insulin Lispro (HUMALOG KWIKPEN)  200 UNIT/ML SOPN Inject 6 Units into the skin 2 (two) times daily before a meal. Please inject 6 units before breakfast and dinner; max dose titration 40 units daily   oseltamivir (TAMIFLU) 75 MG capsule Take 1 capsule (75 mg total) by mouth every 12 (twelve) hours.    pravastatin (PRAVACHOL) 40 MG tablet TAKE 1 TABLET BY MOUTH EVERYDAY AT BEDTIME   propranolol (INDERAL) 20 MG tablet Take 20 mg by mouth 2 (two) times daily. Once daily   tacrolimus (PROGRAF) 1 MG capsule Take 2 mg in am and 3 mg in pm   Turmeric 500 MG TABS Take by mouth. (Patient not taking: Reported on 09/29/2021)   No facility-administered encounter medications on file as of 11/24/2021.  Reviewed chart prior to disease state call. Spoke with patient regarding BP  Recent Office Vitals: BP Readings from Last 3 Encounters:  09/25/21 138/85  09/14/21 (!) 174/72  09/07/21 (!) 173/80   Pulse Readings from Last 3 Encounters:  09/25/21 (!) 131  09/14/21 84  09/07/21 70    Wt Readings from Last 3 Encounters:  09/14/21 177 lb (80.3 kg)  09/07/21 178 lb (80.7 kg)  08/30/21 174 lb 9.6 oz (79.2 kg)     Kidney Function Lab Results  Component Value Date/Time   CREATININE 0.83 09/07/2021 09:40 AM   CREATININE 0.70 04/20/2021 10:29 AM   CREATININE 0.70 01/10/2021 12:03 PM   GFRNONAA 95 10/12/2020 10:57 AM   GFRAA 109 10/12/2020 10:57 AM    BMP Latest Ref Rng & Units 09/07/2021 04/20/2021 01/10/2021  Glucose 65 - 99 mg/dL 86 92 62(L)  BUN 7 - 25 mg/dL '20 12 13  ' Creatinine 0.50 - 1.05 mg/dL 0.83 0.70 0.70  BUN/Creat Ratio 6 - 22 (calc) NOT APPLICABLE 17 19  Sodium 135 - 146 mmol/L 141 141 138  Potassium 3.5 - 5.3 mmol/L 4.2 4.4 3.9  Chloride 98 - 110 mmol/L 107 105 101  CO2 20 - 32 mmol/L '26 24 23  ' Calcium 8.6 - 10.4 mg/dL 8.9 9.1 9.2    Current antihypertensive regimen:  Amlodipine 5 mg daily Propranolol 20 mg tablet twice daily   How often are you checking your Blood Pressure? Patient stated her daughter was checking blood pressure daily while staying with daughter last month. Patient states she doesn't have a blood pressure monitor. Evalina Field CMA a message for monitor prescription.   Current home BP readings: 137/80  What recent interventions/DTPs have been made by any  provider to improve Blood Pressure control since last CPP Visit:  Educated on BP goals and benefits of medications for prevention of heart attack, stroke and kidney damage; Symptoms of hypotension and importance of maintaining adequate hydration; -Counseled to monitor BP at home at least once per week, document, and provide log at future appointments  Any recent hospitalizations or ED visits since last visit with CPP? Yes  What diet changes have been made to improve Blood Pressure Control?  Patient states she has limited her sodium intake, drinks water daily and enjoys eating fruits/vegetables.  What exercise is being done to improve your Blood Pressure Control?  Patient states she walks around some days and tries to stay active.  Adherence Review: Is the patient currently on ACE/ARB medication? No Does the patient have >5 day gap between last estimated fill dates? No   Called Roselyn Reef, No answer, left message of appointment on 12-01-2021 at 11:00 via telephone visit with [name], Pharm D. Notified to have all medications, supplements, blood  pressure and/or blood sugar logs available during appointment and to return call if need to reschedule.  Care Gaps: Shingrix overdue 3 year cologuard overdue Yearly ophthalmology exam overdue  Yearly foot exam overdue 6 month A1C overdue AWV 09-27-2022  Star Rating Drugs: Pravastatin 40 mg- Last filled 08-29-2021 [facility]  [place] Clinical Pharmacist Assistant [phone]

## 2021-11-27 ENCOUNTER — Telehealth: Payer: Self-pay

## 2021-11-27 NOTE — Telephone Encounter (Signed)
I left the pt a message that her prescription for a blood pressure monitor is ready for pickp

## 2021-11-29 ENCOUNTER — Other Ambulatory Visit: Payer: Self-pay | Admitting: Physician Assistant

## 2021-11-30 ENCOUNTER — Telehealth: Payer: Medicare Other

## 2021-12-01 ENCOUNTER — Ambulatory Visit (INDEPENDENT_AMBULATORY_CARE_PROVIDER_SITE_OTHER): Payer: Medicare Other

## 2021-12-01 DIAGNOSIS — I10 Essential (primary) hypertension: Secondary | ICD-10-CM

## 2021-12-01 DIAGNOSIS — E1165 Type 2 diabetes mellitus with hyperglycemia: Secondary | ICD-10-CM

## 2021-12-01 NOTE — Progress Notes (Signed)
Chronic Care Management Pharmacy Note  12/13/2021 Name:  Virtie Bungert MRN:  [id number] DOB:  02/07/1956  Summary: Patient reports that she is not taking Alendronate because she does not think that she needs it.   Recommendations/Changes made from today's visit: Recommend patient take Alendronate first thing in the morning once per week on an empty stomach with a full glass of water and then eat some directly afterwards.  Collaborate with PCP team for patients Osteoporosis diagnoses to be added to her current problem list.   Plan: Patient reports that she would like to learn more about her imaging results prior to taking her medication.    Subjective: Brynja Marker is an 66 y.o. year old female who is a primary patient of Glendale Chard, MD.  The CCM team was consulted for assistance with disease management and care coordination needs.    Engaged with patient by telephone for follow up visit in response to provider referral for pharmacy case management and/or care coordination services. Patient reports that her mother died at 5 and her father died at 30 years old.   Consent to Services:  The patient was given information about Chronic Care Management services, agreed to services, and gave verbal consent prior to initiation of services.  Please see initial visit note for detailed documentation.   Patient Care Team: Glendale Chard, MD as PCP - General (Internal Medicine) Mayford Knife, [facility] (Pharmacist)  Recent office visits: 07/28/2021 PCP OV   Recent consult visits: 09/14/2021 Rheumatology [facility] visits: Admission -09/25/2021    Objective:  Lab Results  Component Value Date   CREATININE 0.83 09/07/2021   BUN 20 09/07/2021   EGFR 78 09/07/2021   GFRNONAA 95 10/12/2020   GFRAA 109 10/12/2020   NA 141 09/07/2021   K 4.2 09/07/2021   CALCIUM 8.9 09/07/2021   CO2 26 09/07/2021   GLUCOSE 86 09/07/2021    Lab Results  Component Value Date/Time    HGBA1C 6.5 (H) 04/20/2021 10:29 AM   HGBA1C 6.1 (H) 01/10/2021 12:03 PM   MICROALBUR 150 04/20/2021 04:05 PM   MICROALBUR 150 04/12/2020 09:55 AM    Last diabetic Eye exam:  Lab Results  Component Value Date/Time   HMDIABEYEEXA No Retinopathy 05/16/2020 12:00 AM    Last diabetic Foot exam: No results found for: HMDIABFOOTEX   Lab Results  Component Value Date   CHOL 144 04/20/2021   HDL 51 04/20/2021   LDLCALC 74 04/20/2021   TRIG 103 04/20/2021   CHOLHDL 2.8 04/20/2021    Hepatic Function Latest Ref Rng & Units 09/07/2021 09/07/2021 04/20/2021  Total Protein 6.1 - 8.1 g/dL 7.1 7.3 7.4  Albumin 3.8 - 4.8 g/dL - - 3.9  AST 10 - 35 U/L - 14 15  ALT 6 - 29 U/L - 6 8  Alk Phosphatase 44 - 121 IU/L - - 180(H)  Total Bilirubin 0.2 - 1.2 mg/dL - 0.6 0.6  Bilirubin, Direct 0.00 - 0.40 mg/dL - - -    Lab Results  Component Value Date/Time   TSH 1.18 09/07/2021 09:40 AM    CBC Latest Ref Rng & Units 04/20/2021 04/12/2020 04/07/2019  WBC 3.4 - 10.8 x10E3/uL 4.8 4.3 6.2  Hemoglobin 11.1 - 15.9 g/dL 11.9 12.5 14.3  Hematocrit 34.0 - 46.6 % 34.5 37.8 41.3  Platelets 150 - 450 x10E3/uL 115(L) 130(L) 131(L)    Lab Results  Component Value Date/Time   VD25OH 33 09/07/2021 09:40 AM    Clinical ASCVD: No  The 10-year ASCVD risk score (Arnett DK, et al., 2019) is: 37.5%   Values used to calculate the score:     Age: 66 years     Sex: Female     Is Non-Hispanic African American: Yes     Diabetic: Yes     Tobacco smoker: Yes     Systolic Blood Pressure: 947 mmHg     Is BP treated: Yes     HDL Cholesterol: 51 mg/dL     Total Cholesterol: 144 mg/dL    Depression screen [facility] 2/9 08/30/2021 08/25/2020 07/02/2019  Decreased Interest 0 0 0  Down, Depressed, Hopeless 0 0 0  PHQ - 2 Score 0 0 0  Altered sleeping - - 0  Tired, decreased energy - - 1  Change in appetite - - 0  Feeling bad or failure about yourself  - - 0  Trouble concentrating - - 0  Moving slowly or fidgety/restless - -  0  Suicidal thoughts - - 0  PHQ-9 Score - - 1  Difficult doing work/chores - - Not difficult at all      Social History   Tobacco Use  Smoking Status Former   Packs/day: 0.75   Years: 40.00   Pack years: 30.00   Types: Cigarettes   Start date: 11/12/1974  Smokeless Tobacco Never  Tobacco Comments   encouraged to avoid smoking in the house and in her car   BP Readings from Last 3 Encounters:  09/25/21 138/85  09/14/21 (!) 174/72  09/07/21 (!) 173/80   Pulse Readings from Last 3 Encounters:  09/25/21 (!) 131  09/14/21 84  09/07/21 70   Wt Readings from Last 3 Encounters:  09/14/21 177 lb (80.3 kg)  09/07/21 178 lb (80.7 kg)  08/30/21 174 lb 9.6 oz (79.2 kg)   BMI Readings from Last 3 Encounters:  09/14/21 28.57 kg/m  09/07/21 27.88 kg/m  08/30/21 27.35 kg/m    Assessment/Interventions: Review of patient past medical history, allergies, medications, health status, including review of consultants reports, laboratory and other test data, was performed as part of comprehensive evaluation and provision of chronic care management services.   SDOH:  (Social Determinants of Health) assessments and interventions performed: Yes  SDOH Screenings   Alcohol Screen: Not on file  Depression (PHQ2-9): Low Risk    PHQ-2 Score: 0  Financial Resource Strain: Low Risk    Difficulty of Paying Living Expenses: Not hard at all  Food Insecurity: Food Insecurity Present   Worried About Charity fundraiser in the Last Year: Sometimes true   Arboriculturist in the Last Year: Sometimes true  Housing: Not on file  Physical Activity: Inactive   Days of Exercise per Week: 0 days   Minutes of Exercise per Session: 0 min  Social Connections: Not on file  Stress: No Stress Concern Present   Feeling of Stress : Not at all  Tobacco Use: Medium Risk   Smoking Tobacco Use: Former   Smokeless Tobacco Use: Never   Passive Exposure: Not on file  Transportation Needs: No Transportation Needs    Lack of Transportation (Medical): No   Lack of Transportation (Non-Medical): No    CCM Care Plan  No Known Allergies  Medications Reviewed Today     Reviewed by Mayford Knife, [facility] (Pharmacist) on 12/01/21 at [phone]  Med List Status: <None>   Medication Order Taking? Sig Documenting Provider Last Dose Status Informant  albuterol (VENTOLIN HFA) 108 (90 Base) MCG/ACT inhaler [id number]  Yes Inhale into the lungs. [provider] Taking Active   alendronate (FOSAMAX) 70 MG tablet [id number] No Take 1 tablet (70 mg total) by mouth once a week. Take with a full glass of water on an empty stomach.  Patient not taking: Reported on 09/29/2021   Ofilia Neas, PA-C Not Taking Active   amLODipine (NORVASC) 5 MG tablet [id number] Yes TAKE 1 TABLET BY MOUTH EVERY DAY Glendale Chard, MD Taking Active   apixaban (ELIQUIS) 5 MG TABS tablet [id number] Yes Take by mouth. [provider] Taking Active   aspirin 81 MG chewable tablet [id number] Yes Chew 81 mg by mouth daily. [provider] Taking Active Self  azaTHIOprine (IMURAN) 50 MG tablet [id number] Yes Take 100 mg by mouth 2 (two) times daily.  [provider] Taking Active Self  Blood Glucose Monitoring Suppl (ACCU-CHEK GUIDE ME) w/Device KIT [id number] Yes Use to check blood sugar 3 times a day. Dx code e11.65 Glendale Chard, MD Taking Active   Cholecalciferol 25 MCG (1000 UT) capsule [id number] Yes Take 4,000 Units by mouth daily. [provider] Taking Active   cyclobenzaprine (FLEXERIL) 10 MG tablet [id number] Yes TAKE 1 TABLET BY MOUTH EVERYDAY AT BEDTIME Ghumman, Ramandeep, NP Taking Active   diltiazem (TIAZAC) 240 MG 24 hr capsule [id number] Yes Take by mouth. [provider] Taking Active   glucose blood (ACCU-CHEK GUIDE) test strip [id number] Yes Use as instructed to check blood sugar 3 times a day. Dx code e11.65 Glendale Chard, MD Taking Active   insulin degludec [facility]) 200 UNIT/ML  FlexTouch Pen [id number] Yes Inject 42 Units into the skin. Pt doing 32 units qhs [provider] Taking Active   Insulin Lispro (HUMALOG KWIKPEN) 200 UNIT/ML SOPN [id number] Yes Inject 6 Units into the skin 2 (two) times daily before a meal. Please inject 6 units before breakfast and dinner; max dose titration 40 units daily Glendale Chard, MD Taking Active   oseltamivir (TAMIFLU) 75 MG capsule [id number] Yes Take 1 capsule (75 mg total) by mouth every 12 (twelve) hours. Eliezer Lofts, FNP Taking Active   pravastatin (PRAVACHOL) 40 MG tablet [id number] Yes TAKE 1 TABLET BY MOUTH EVERYDAY AT BEDTIME Glendale Chard, MD Taking Active   propranolol (INDERAL) 20 MG tablet [id number] Yes Take 20 mg by mouth 2 (two) times daily. Once daily [provider] Taking Active Self  tacrolimus (PROGRAF) 1 MG capsule [id number] Yes Take 2 mg in am and 3 mg in pm [provider] Taking Active Self  Turmeric 500 MG TABS [id number] Yes Take by mouth. 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   Type II diabetes mellitus, uncontrolled 07/11/2013    Immunization History  Administered Date(s) Administered   DTaP 06/10/2012   Influenza, High Dose Seasonal PF 08/16/2021   Influenza,inj,Quad PF,6+ Mos 10/06/2013, 08/12/2015, 08/01/2018, 07/02/2019   Influenza-Unspecified 08/18/2012, 08/06/2018, 07/26/2020   Moderna Sars-Covid-2 Vaccination 11/03/2019, 12/01/2019, 07/26/2020   Pfizer Covid-19 Vaccine  Bivalent Booster 82yr & up 09/02/2021  Pneumococcal Conjugate-13 08/12/2015   Pneumococcal Polysaccharide-23 07/13/2013, 12/16/2018    Conditions to be addressed/monitored:  Hypertension, Hyperlipidemia, and Query Osteoporosis  Care Plan : [place]  Updates made by Mayford Knife, RPH since 12/13/2021 12:00 AM     Problem: HTN, DM II, Query Osteoperosis      Long-Range Goal: Disease Management   Note:   Current Barriers:  Does not adhere to prescribed medication regimen Does not maintain contact with provider office  Pharmacist Clinical Goal(s):  Patient will achieve adherence to monitoring guidelines and medication adherence to achieve therapeutic efficacy through collaboration with PharmD and provider.   Interventions: 1:1 collaboration with Glendale Chard, MD regarding development and update of comprehensive plan of care as evidenced by provider attestation and co-signature Inter-disciplinary care team collaboration (see longitudinal plan of care) Comprehensive medication review performed; medication list updated in electronic medical record  Hypertension (BP goal <130/80) -Controlled -Current treatment: Amlodipine 5 mg tablet once per day Appropriate, Effective, Safe, Accessible Diltiazem 250 mg taking 1 capsule by mouth daily  Appropriate, Effective, Safe, Accessible -Current home readings: Not checking her BP at home  -Current dietary habits: she is limiting the salt in her diet, just using herbs  -Current exercise habits: please see diabetes for more details  -Denies hypotensive/hypertensive symptoms -Educated on BP goals and benefits of medications for prevention of heart attack, stroke and kidney damage; Daily salt intake goal < 2300 mg; Proper BP monitoring technique; -Counseled to monitor BP at home twice per week, document, and provide log at future appointments -Recommended to continue current medication  Diabetes (A1c goal  <7%) -Controlled -Current medications: Tresiba 200 unit/ml - inject 42 units at bedtime Appropriate, Effective, Query Safe  Patient injecting 32 units  Patient currently weighs 165 - 169 lbs  -Current home glucose readings fasting glucose: 100 Patient reports that she had one low blood sugar last week, reading was not given  -Denies hypoglycemic/hyperglycemic symptoms -Current meal patterns: she is not eating a lot of sugar  drinks: she is drinking plenty of water  -Current exercise: She exercises by doing things around the house, and walking to the park across the street with her dog,  -Educated on A1c and blood sugar goals; Complications of diabetes including kidney damage, retinal damage, and cardiovascular disease; Prevention and management of hypoglycemic episodes; -Congratulated patient on closely monitoring blood sugar -Counseled to check feet daily and get yearly eye exams -Recommended to continue current medication   Query Osteoporosis: -Uncontrolled -Last DEXA Scan: 07/06/2021   T-Score femoral neck: -2.7  T-Score forearm radius: -3.9  10-year probability of major osteoporotic fracture: 19%  10-year probability of hip fracture: 5.2% -Patient is a candidate for pharmacologic treatment due to T-Score < -2.5 in femoral neck -Current treatment  Alendronate 70 mg tablet once per week Appropriate, Query Effective Patient reports that she is not taking her medication  -Medications previously tried: None noted  -Patient is not compliant to medication regimen. Discussed in great detail the importance of taking her medication daily.   -Counseled on oral bisphosphonate administration: take in the morning, 30 minutes prior to food with 6-8 oz of water. Do not lie down for at least 30 minutes after taking. Recommend weight-bearing and muscle strengthening exercises for building and maintaining bone density. Collaborate with patient and her daughter to encourage taking medication for bone  health -Recommended to continue current medication  Patient Goals/Self-Care Activities Patient will:  - take medications as prescribed as evidenced by patient report  and record review  Follow Up Plan: The patient has been provided with contact information for the care management team and has been advised to call with any health related questions or concerns.       Medication Assistance: None required.  Patient affirms current coverage meets needs.  Compliance/Adherence/Medication fill history: Care Gaps: Shingrix Vaccine Fecal DNA cologuard Ophthalmology Exam Foot Exam Hemoglobin A1c  Star-Rating Drugs: Pravastatin 40 mg tablet   Patient's preferred pharmacy is:  [id number] [address]Phone: [phone]: [phone] Uses pill box? No - patients keeps her medications in vials  Pt endorses 85% compliance  We discussed: Benefits of medication synchronization, packaging and delivery as well as enhanced pharmacist oversight with [employer]. Patient decided to: Continue current medication management strategy  Care Plan and Follow Up Patient Decision:  Patient agrees to Care Plan and Follow-up.  Plan: The patient has been provided with contact information for the care management team and has been advised to call with any health related questions or concerns.   VOrlando Penner CPP, PharmD Clinical Pharmacist Practitioner [facility] [phone]

## 2021-12-13 NOTE — Patient Instructions (Signed)
Visit Information It was great speaking with you today!  Please let me know if you have any questions about our visit.   Goals Addressed             This Visit's Progress    Manage My Medicine       Timeframe:  Long-Range Goal Priority:  High Start Date:                             Expected End Date:                       Follow Up Date 05/30/2022  In Progress:   - call for medicine refill 2 or 3 days before it runs out - keep a list of all the medicines I take; vitamins and herbals too - use a pillbox to sort medicine - use an alarm clock or phone to remind me to take my medicine    Why is this important?   These steps will help you keep on track with your medicines.   Notes:  Please continue to follow up closely         Patient Care Plan: CCM Pharmacy Care Plan     Problem Identified: HTN, DM II, Query Osteoperosis      Long-Range Goal: Disease Management   Note:   Current Barriers:  Does not adhere to prescribed medication regimen Does not maintain contact with provider office  Pharmacist Clinical Goal(s):  Patient will achieve adherence to monitoring guidelines and medication adherence to achieve therapeutic efficacy through collaboration with PharmD and provider.   Interventions: 1:1 collaboration with Glendale Chard, MD regarding development and update of comprehensive plan of care as evidenced by provider attestation and co-signature Inter-disciplinary care team collaboration (see longitudinal plan of care) Comprehensive medication review performed; medication list updated in electronic medical record  Hypertension (BP goal <130/80) -Controlled -Current treatment: Amlodipine 5 mg tablet once per day Appropriate, Effective, Safe, Accessible Diltiazem 250 mg taking 1 capsule by mouth daily  Appropriate, Effective, Safe, Accessible -Current home readings: Not checking her BP at home  -Current dietary habits: she is limiting the salt in her diet, just  using herbs  -Current exercise habits: please see diabetes for more details  -Denies hypotensive/hypertensive symptoms -Educated on BP goals and benefits of medications for prevention of heart attack, stroke and kidney damage; Daily salt intake goal < 2300 mg; Proper BP monitoring technique; -Counseled to monitor BP at home twice per week, document, and provide log at future appointments -Recommended to continue current medication  Diabetes (A1c goal <7%) -Controlled -Current medications: Tresiba 200 unit/ml - inject 42 units at bedtime Appropriate, Effective, Query Safe  Patient injecting 32 units  Patient currently weighs 165 - 169 lbs  -Current home glucose readings fasting glucose: 100 Patient reports that she had one low blood sugar last week, reading was not given  -Denies hypoglycemic/hyperglycemic symptoms -Current meal patterns: she is not eating a lot of sugar  drinks: she is drinking plenty of water  -Current exercise: She exercises by doing things around the house, and walking to the park across the street with her dog,  -Educated on A1c and blood sugar goals; Complications of diabetes including kidney damage, retinal damage, and cardiovascular disease; Prevention and management of hypoglycemic episodes; -Congratulated patient on closely monitoring blood sugar -Counseled to check feet daily and get yearly eye exams -Recommended to continue current medication  Query Osteoporosis: -Uncontrolled -Last DEXA Scan: 07/06/2021   T-Score femoral neck: -2.7  T-Score forearm radius: -3.9  10-year probability of major osteoporotic fracture: 19%  10-year probability of hip fracture: 5.2% -Patient is a candidate for pharmacologic treatment due to T-Score < -2.5 in femoral neck -Current treatment  Alendronate 70 mg tablet once per week Appropriate, Query Effective Patient reports that she is not taking her medication  -Medications previously tried: None noted  -Patient is not  compliant to medication regimen. Discussed in great detail the importance of taking her medication daily.   -Counseled on oral bisphosphonate administration: take in the morning, 30 minutes prior to food with 6-8 oz of water. Do not lie down for at least 30 minutes after taking. Recommend weight-bearing and muscle strengthening exercises for building and maintaining bone density. Collaborate with patient and her daughter to encourage taking medication for bone health -Recommended to continue current medication  Patient Goals/Self-Care Activities Patient will:  - take medications as prescribed as evidenced by patient report and record review  Follow Up Plan: The patient has been provided with contact information for the care management team and has been advised to call with any health related questions or concerns.       Patient agreed to services and verbal consent obtained.   The patient verbalized understanding of instructions, educational materials, and care plan provided today and agreed to receive a mailed copy of patient instructions, educational materials, and care plan.   [name], PharmD Clinical Pharmacist [facility] [phone]

## 2021-12-16 ENCOUNTER — Other Ambulatory Visit: Payer: Self-pay | Admitting: Physician Assistant

## 2021-12-16 ENCOUNTER — Other Ambulatory Visit: Payer: Self-pay | Admitting: Internal Medicine

## 2021-12-25 ENCOUNTER — Other Ambulatory Visit: Payer: Self-pay | Admitting: Internal Medicine

## 2021-12-26 DIAGNOSIS — I10 Essential (primary) hypertension: Secondary | ICD-10-CM

## 2021-12-26 DIAGNOSIS — E1165 Type 2 diabetes mellitus with hyperglycemia: Secondary | ICD-10-CM | POA: Diagnosis not present

## 2022-01-04 DIAGNOSIS — Z5181 Encounter for therapeutic drug level monitoring: Secondary | ICD-10-CM | POA: Diagnosis not present

## 2022-01-04 DIAGNOSIS — R030 Elevated blood-pressure reading, without diagnosis of hypertension: Secondary | ICD-10-CM | POA: Diagnosis not present

## 2022-01-04 DIAGNOSIS — K7400 Hepatic fibrosis, unspecified: Secondary | ICD-10-CM | POA: Diagnosis not present

## 2022-01-04 DIAGNOSIS — I4891 Unspecified atrial fibrillation: Secondary | ICD-10-CM | POA: Diagnosis not present

## 2022-01-04 DIAGNOSIS — Z79624 Long term (current) use of inhibitors of nucleotide synthesis: Secondary | ICD-10-CM | POA: Diagnosis not present

## 2022-01-04 DIAGNOSIS — Z944 Liver transplant status: Secondary | ICD-10-CM | POA: Diagnosis not present

## 2022-01-04 DIAGNOSIS — K754 Autoimmune hepatitis: Secondary | ICD-10-CM | POA: Diagnosis not present

## 2022-01-04 DIAGNOSIS — Z79899 Other long term (current) drug therapy: Secondary | ICD-10-CM | POA: Diagnosis not present

## 2022-01-04 DIAGNOSIS — R748 Abnormal levels of other serum enzymes: Secondary | ICD-10-CM | POA: Diagnosis not present

## 2022-01-04 DIAGNOSIS — D849 Immunodeficiency, unspecified: Secondary | ICD-10-CM | POA: Diagnosis not present

## 2022-01-04 DIAGNOSIS — F17210 Nicotine dependence, cigarettes, uncomplicated: Secondary | ICD-10-CM | POA: Diagnosis not present

## 2022-01-04 DIAGNOSIS — Z79631 Long term (current) use of antimetabolite agent: Secondary | ICD-10-CM | POA: Diagnosis not present

## 2022-02-28 ENCOUNTER — Ambulatory Visit (INDEPENDENT_AMBULATORY_CARE_PROVIDER_SITE_OTHER): Payer: Medicare Other | Admitting: Internal Medicine

## 2022-02-28 ENCOUNTER — Encounter: Payer: Self-pay | Admitting: Internal Medicine

## 2022-02-28 VITALS — BP 130/70 | HR 90 | Temp 98.4°F | Ht 69.2 in | Wt 170.0 lb

## 2022-02-28 DIAGNOSIS — I10 Essential (primary) hypertension: Secondary | ICD-10-CM

## 2022-02-28 DIAGNOSIS — Z79899 Other long term (current) drug therapy: Secondary | ICD-10-CM

## 2022-02-28 DIAGNOSIS — Z944 Liver transplant status: Secondary | ICD-10-CM

## 2022-02-28 DIAGNOSIS — Z2821 Immunization not carried out because of patient refusal: Secondary | ICD-10-CM

## 2022-02-28 DIAGNOSIS — M816 Localized osteoporosis [Lequesne]: Secondary | ICD-10-CM

## 2022-02-28 DIAGNOSIS — D849 Immunodeficiency, unspecified: Secondary | ICD-10-CM

## 2022-02-28 DIAGNOSIS — E213 Hyperparathyroidism, unspecified: Secondary | ICD-10-CM | POA: Diagnosis not present

## 2022-02-28 DIAGNOSIS — Z6824 Body mass index (BMI) 24.0-24.9, adult: Secondary | ICD-10-CM

## 2022-02-28 DIAGNOSIS — Z1211 Encounter for screening for malignant neoplasm of colon: Secondary | ICD-10-CM

## 2022-02-28 DIAGNOSIS — E1165 Type 2 diabetes mellitus with hyperglycemia: Secondary | ICD-10-CM

## 2022-02-28 NOTE — Progress Notes (Signed)
?[name],acting as a Education administrator for Maximino Greenland, MD.,have documented all relevant documentation on the behalf of Maximino Greenland, MD,as directed by  Maximino Greenland, MD while in the presence of Maximino Greenland, MD.  ?This visit occurred during the SARS-CoV-2 public health emergency.  Safety protocols were in place, including screening questions prior to the visit, additional usage of staff PPE, and extensive cleaning of exam room while observing appropriate contact time as indicated for disinfecting solutions. ? ?Subjective:  ?  ? Patient ID: Kathryn Walsh , female    DOB: [birth year] , 66 y.o.   MRN: [id number] ? ? ?Chief Complaint  ?Patient presents with  ? Diabetes  ? Hypertension  ? ? ?HPI ? ?She is here today for HTN and diabetes check. She reports compliance with meds. She denies headaches, chest pain and shortness of breath. She admits she is not getting a lot of exercise, but she remains active.  ? ?Hypertension ?This is a chronic problem. The current episode started more than 1 year ago. The problem has been gradually improving since onset. The problem is controlled. Pertinent negatives include no blurred vision, chest pain, headaches, palpitations or shortness of breath. The current treatment provides moderate improvement. Compliance problems include exercise.   ?Diabetes ?She presents for her follow-up diabetic visit. She has type 2 diabetes mellitus. Her disease course has been worsening. There are no hypoglycemic associated symptoms. Pertinent negatives for hypoglycemia include no dizziness or headaches. Pertinent negatives for diabetes include no blurred vision, no chest pain, no fatigue, no polydipsia, no polyphagia and no polyuria. There are no hypoglycemic complications. Risk factors for coronary artery disease include diabetes mellitus, dyslipidemia, hypertension and sedentary lifestyle. Her weight is decreasing steadily. She is following a generally healthy diet. When asked about  meal planning, she reported none. She has not had a previous visit with a dietitian. She participates in exercise intermittently. Her breakfast blood glucose is taken between 9-10 am. Her breakfast blood glucose range is generally 140-180 mg/dl. She does not see a podiatrist.Eye exam current: Has a appt coming up. .   ? ?Past Medical History:  ?Diagnosis Date  ? Diabetes mellitus without complication ([place])   ? High cholesterol   ? HTN (hypertension)   ? Liver transplant recipient [facility])   ?  ? ?Family History  ?Problem Relation Age of Onset  ? Healthy Mother   ? Healthy Father   ? Heart disease Sister   ? Heart disease Brother   ? Gout Brother   ? Heart disease Brother   ? Healthy Daughter   ? Diabetes Son   ? ? ? ?Current Outpatient Medications:  ?  amLODipine (NORVASC) 5 MG tablet, TAKE 1 TABLET BY MOUTH EVERY DAY, Disp: 90 tablet, Rfl: 2 ?  azaTHIOprine (IMURAN) 50 MG tablet, Take 100 mg by mouth 2 (two) times daily. , Disp: , Rfl:  ?  Blood Glucose Monitoring Suppl (ACCU-CHEK GUIDE ME) w/Device KIT, Use to check blood sugar 3 times a day. Dx code e11.65, Disp: 1 kit, Rfl: 3 ?  Cholecalciferol 25 MCG (1000 UT) capsule, Take 4,000 Units by mouth daily., Disp: , Rfl:  ?  cyclobenzaprine (FLEXERIL) 10 MG tablet, TAKE 1 TABLET BY MOUTH EVERYDAY AT BEDTIME, Disp: 30 tablet, Rfl: 0 ?  glucose blood (ACCU-CHEK GUIDE) test strip, Use as instructed to check blood sugar 3 times a day. Dx code e11.65, Disp: 100 each, Rfl: 12 ?  insulin degludec (TRESIBA FLEXTOUCH) 200 UNIT/ML FlexTouch Pen,  Inject 42 Units into the skin. Pt doing 32 units qhs, Disp: , Rfl:  ?  Insulin Lispro (HUMALOG KWIKPEN) 200 UNIT/ML SOPN, Inject 6 Units into the skin 2 (two) times daily before a meal. Please inject 6 units before breakfast and dinner; max dose titration 40 units daily, Disp: 5 pen, Rfl: 5 ?  pravastatin (PRAVACHOL) 40 MG tablet, TAKE 1 TABLET BY MOUTH EVERYDAY AT BEDTIME, Disp: 90 tablet, Rfl: 1 ?  tacrolimus (PROGRAF) 1 MG capsule,  Take 2 mg in am and 3 mg in pm, Disp: , Rfl:  ?  dapagliflozin propanediol (FARXIGA) 5 MG TABS tablet, Take 1 tablet (5 mg total) by mouth daily before breakfast., Disp: 30 tablet, Rfl: 1  ? ?No Known Allergies  ? ?Review of Systems  ?Constitutional: Negative.  Negative for fatigue.  ?Eyes:  Negative for blurred vision.  ?Respiratory: Negative.  Negative for shortness of breath.   ?Cardiovascular: Negative.  Negative for chest pain and palpitations.  ?Gastrointestinal: Negative.   ?Endocrine: Negative for polydipsia, polyphagia and polyuria.  ?Neurological: Negative.  Negative for dizziness and headaches.  ?Psychiatric/Behavioral: Negative.     ? ?Today's Vitals  ? 02/28/22 1441  ?BP: 130/70  ?Pulse: 90  ?Temp: 98.4 ?F (36.9 ?C)  ?Weight: 170 lb (77.1 kg)  ?Height: 5' 9.2" (1.758 m)  ?PainSc: 0-No pain  ? ?Body mass index is 24.96 kg/m?.  ?Wt Readings from Last 3 Encounters:  ?02/28/22 170 lb (77.1 kg)  ?09/14/21 177 lb (80.3 kg)  ?09/07/21 178 lb (80.7 kg)  ?  ? ?Objective:  ?Physical Exam ?Vitals and nursing note reviewed.  ?Constitutional:   ?   Appearance: Normal appearance.  ?   Comments: [place] ?  ?HENT:  ?   Head: Normocephalic and atraumatic.  ?Eyes:  ?   Extraocular Movements: Extraocular movements intact.  ?Cardiovascular:  ?   Rate and Rhythm: Normal rate and regular rhythm.  ?   Heart sounds: Normal heart sounds.  ?Pulmonary:  ?   Effort: Pulmonary effort is normal.  ?   Breath sounds: Normal breath sounds.  ?Musculoskeletal:  ?   Cervical back: Normal range of motion.  ?Skin: ?   General: Skin is warm.  ?Neurological:  ?   General: No focal deficit present.  ?   Mental Status: She is alert.  ?Psychiatric:     ?   Mood and Affect: Mood normal.     ?   Behavior: Behavior normal.  ?   ?Assessment And Plan:  ?   ?1. Essential hypertension ?Comments: Chronic, controlled. She will c/w amlodipine 71m daily. She is encouraged to follow low sodium diet.  ?- CMP14+EGFR ?- CBC no Diff ? ?2. Uncontrolled type 2  diabetes mellitus with hyperglycemia ([place] ?Comments: I will start Farxiga, 523mdaily. Advised to stay well hydrated while on this medication.  She will f/u in 4 weeks for re-evaluation.  ?- Hemoglobin A1c ?- Microalbumin / Creatinine Urine Ratio ?- Lipid panel ? ?3. Localized osteoporosis without current pathological fracture ?Comments: She declines pharmacologic treatment.  She is encouraged to engage in weight-bearing exercises at least 3 days per week.  ? ?4. Hyperparathyroidism ([place]?- Parathyroid Hormone, Intact w/Ca ? ?5. Immunosuppressed status ([place]?Comments: She is s/p liver transplant. She is currently on azathioprine and tacrolimus.  ? ?6. Special screening for malignant neoplasm of colon ?Comments: She declines colonoscopy. She agrees to [employer].  ?- Cologuard ? ?7. BMI 24.0-24.9, adult ?Comments: She is encouraged to aim for at  least 150 minutes of exercise per week.  ? ?8. S/P liver transplant ([place]) ? ?9. Drug therapy ?- Vitamin B12 ? ?10. Herpes zoster vaccination declined ?  ?Patient was given opportunity to ask questions. Patient verbalized understanding of the plan and was able to repeat key elements of the plan. All questions were answered to their satisfaction.  ? ?I, Maximino Greenland, MD, have reviewed all documentation for this visit. The documentation on 02/28/22 for the exam, diagnosis, procedures, and orders are all accurate and complete.  ? ?IF YOU HAVE BEEN REFERRED TO A SPECIALIST, IT MAY TAKE 1-2 WEEKS TO SCHEDULE/PROCESS THE REFERRAL. IF YOU HAVE NOT HEARD FROM US/SPECIALIST IN TWO WEEKS, PLEASE GIVE [place] A CALL AT [phone].  ? ?THE PATIENT IS ENCOURAGED TO PRACTICE SOCIAL DISTANCING DUE TO THE COVID-19 PANDEMIC.   ?

## 2022-02-28 NOTE — Patient Instructions (Addendum)
Osteoporosis ? ?Osteoporosis is when the bones get thin and weak. This can cause your bones to break (fracture) more easily. ?What are the causes? ?The exact cause of this condition is not known. ?What increases the risk? ?Having family members with this condition. ?Not eating enough healthy foods. ?Taking certain medicines. ?Being female. ?Being age 66 or older. ?Smoking or using other products that contain nicotine or tobacco, such as e-cigarettes or chewing tobacco. ?Not exercising. ?Being of European or Asian ancestry. ?Having a small body frame. ?What are the signs or symptoms? ?A broken bone might be the first sign, especially if the break results from a fall or injury that usually would not cause a bone to break. ?Other signs and symptoms include: ?Pain in the neck or low back. ?Being hunched over (stooped posture). ?Getting shorter. ?How is this treated? ?Eating more foods with more calcium and vitamin D in them. ?Doing exercises. ?Stopping tobacco use. ?Limiting how much alcohol you drink. ?Taking medicines to slow bone loss or help make the bones stronger. ?Taking supplements of calcium and vitamin D every day. ?Taking medicines to replace chemicals in the body (hormone replacement medicines). ?Monitoring your levels of calcium and vitamin D. ?The goal of treatment is to strengthen your bones and lower your risk for a bone break. ?Follow these instructions at home: ?Eating and drinking ?Eat plenty of calcium and vitamin D. These nutrients are good for your bones. Good sources of calcium and vitamin D include: ?Some fish, such as salmon and tuna. ?Foods that have calcium and vitamin D added to them (fortified foods), such as some breakfast cereals. ?Egg yolks. ?Cheese. ?Liver. ? ?Activity ?Do exercises as told by your doctor. Ask your doctor what exercises are safe for you. You should do: ?Exercises that make your muscles work to hold your body weight up (weight-bearing exercises). These include tai chi,  yoga, and walking. ?Exercises to make your muscles stronger. One example is lifting weights. ?Lifestyle ?Do not drink alcohol if: ?Your doctor tells you not to drink. ?You are pregnant, may be pregnant, or are planning to become pregnant. ?If you drink alcohol: ?Limit how much you use to: ?0-1 drink a day for women. ?0-2 drinks a day for men. ?Know how much alcohol is in your drink. In the [place] one drink equals one 12 oz bottle of beer (355 mL), one 5 oz glass of wine (148 mL), or one 1? oz glass of hard liquor (44 mL). ?Do not smoke or use any products that contain nicotine or tobacco. If you need help quitting, ask your doctor. ?Preventing falls ?Use tools to help you move around (mobility aids) as needed. These include canes, walkers, scooters, and crutches. ?Keep rooms well-lit. ?Put away things on the floor that could make you trip. These include cords and rugs. ?Install safety rails on stairs. Install grab bars in bathrooms. ?Use rubber mats in slippery areas, like bathrooms. ?Wear shoes that: ?Fit you well. ?Support your feet. ?Have closed toes. ?Have rubber soles or low heels. ?Tell your doctor about all of the medicines you are taking. Some medicines can make you more likely to fall. ?General instructions ?Take over-the-counter and prescription medicines only as told by your doctor. ?Keep all follow-up visits. ?Contact a doctor if: ?You have not been tested (screened) for osteoporosis and you are: ?A woman who is age 46 or older. ?A man who is age 84 or older. ?Get help right away if: ?You fall. ?You get hurt. ?Summary ?Osteoporosis happens when your  bones get thin and weak. ?Weak bones can break (fracture) more easily. ?Eat plenty of calcium and vitamin D. These are good for your bones. ?Tell your doctor about all of the medicines that you take. ?This information is not intended to replace advice given to you by your health care provider. Make sure you discuss any questions you have with your health care  provider. ?Document Revised: 03/31/2020 Document Reviewed: 03/31/2020 ?Elsevier Patient Education ? [place]. ? ? ?Diabetes Mellitus and Nutrition, Adult ?When you have diabetes, or diabetes mellitus, it is very important to have healthy eating habits because your blood sugar (glucose) levels are greatly affected by what you eat and drink. Eating healthy foods in the right amounts, at about the same times every day, can help you: ?Manage your blood glucose. ?Lower your risk of heart disease. ?Improve your blood pressure. ?Reach or maintain a healthy weight. ?What can affect my meal plan? ?Every person with diabetes is different, and each person has different needs for a meal plan. Your health care provider may recommend that you work with a dietitian to make a meal plan that is best for you. Your meal plan may vary depending on factors such as: ?The calories you need. ?The medicines you take. ?Your weight. ?Your blood glucose, blood pressure, and cholesterol levels. ?Your activity level. ?Other health conditions you have, such as heart or kidney disease. ?How do carbohydrates affect me? ?Carbohydrates, also called carbs, affect your blood glucose level more than any other type of food. Eating carbs raises the amount of glucose in your blood. ?It is important to know how many carbs you can safely have in each meal. This is different for every person. Your dietitian can help you calculate how many carbs you should have at each meal and for each snack. ?How does alcohol affect me? ?Alcohol can cause a decrease in blood glucose (hypoglycemia), especially if you use insulin or take certain diabetes medicines by mouth. Hypoglycemia can be a life-threatening condition. Symptoms of hypoglycemia, such as sleepiness, dizziness, and confusion, are similar to symptoms of having too much alcohol. ?Do not drink alcohol if: ?Your health care provider tells you not to drink. ?You are pregnant, may be pregnant, or are  planning to become pregnant. ?If you drink alcohol: ?Limit how much you have to: ?0-1 drink a day for women. ?0-2 drinks a day for men. ?Know how much alcohol is in your drink. In the [place] one drink equals one 12 oz bottle of beer (355 mL), one 5 oz glass of wine (148 mL), or one 1? oz glass of hard liquor (44 mL). ?Keep yourself hydrated with water, diet soda, or unsweetened iced tea. Keep in mind that regular soda, juice, and other mixers may contain a lot of sugar and must be counted as carbs. ?What are tips for following this plan? ? ?Reading food labels ?Start by checking the serving size on the Nutrition Facts label of packaged foods and drinks. The number of calories and the amount of carbs, fats, and other nutrients listed on the label are based on one serving of the item. Many items contain more than one serving per package. ?Check the total grams (g) of carbs in one serving. ?Check the number of grams of saturated fats and trans fats in one serving. Choose foods that have a low amount or none of these fats. ?Check the number of milligrams (mg) of salt (sodium) in one serving. Most people should limit total sodium intake to  less than 2,300 mg per day. ?Always check the nutrition information of foods labeled as "low-fat" or "nonfat." These foods may be higher in added sugar or refined carbs and should be avoided. ?Talk to your dietitian to identify your daily goals for nutrients listed on the label. ?Shopping ?Avoid buying canned, pre-made, or processed foods. These foods tend to be high in fat, sodium, and added sugar. ?Shop around the outside edge of the grocery store. This is where you will most often find fresh fruits and vegetables, bulk grains, fresh meats, and fresh dairy products. ?Cooking ?Use low-heat cooking methods, such as baking, instead of high-heat cooking methods, such as deep frying. ?Cook using healthy oils, such as olive, canola, or sunflower oil. ?Avoid cooking with butter, cream, or  high-fat meats. ?Meal planning ?Eat meals and snacks regularly, preferably at the same times every day. Avoid going long periods of time without eating. ?Eat foods that are high in fiber, such as fresh fruits,

## 2022-03-01 LAB — CMP14+EGFR
ALT: 8 IU/L (ref 0–32)
AST: 17 IU/L (ref 0–40)
Albumin/Globulin Ratio: 1.2 (ref 1.2–2.2)
Albumin: 3.9 g/dL (ref 3.8–4.8)
Alkaline Phosphatase: 156 IU/L — ABNORMAL HIGH (ref 44–121)
BUN/Creatinine Ratio: 20 (ref 12–28)
BUN: 22 mg/dL (ref 8–27)
Bilirubin Total: 0.4 mg/dL (ref 0.0–1.2)
CO2: 21 mmol/L (ref 20–29)
Calcium: 9.1 mg/dL (ref 8.7–10.3)
Chloride: 108 mmol/L — ABNORMAL HIGH (ref 96–106)
Creatinine, Ser: 1.11 mg/dL — ABNORMAL HIGH (ref 0.57–1.00)
Globulin, Total: 3.2 g/dL (ref 1.5–4.5)
Glucose: 92 mg/dL (ref 70–99)
Potassium: 4.7 mmol/L (ref 3.5–5.2)
Sodium: 142 mmol/L (ref 134–144)
Total Protein: 7.1 g/dL (ref 6.0–8.5)
eGFR: 55 mL/min/{1.73_m2} — ABNORMAL LOW (ref >59)

## 2022-03-01 LAB — CBC
Hematocrit: 31 % — ABNORMAL LOW (ref 34.0–46.6)
Hemoglobin: 10.9 g/dL — ABNORMAL LOW (ref 11.1–15.9)
MCH: 32.2 pg (ref 26.6–33.0)
MCHC: 35.2 g/dL (ref 31.5–35.7)
MCV: 92 fL (ref 79–97)
Platelets: 132 10*3/uL — ABNORMAL LOW (ref 150–450)
RBC: 3.38 x10E6/uL — ABNORMAL LOW (ref 3.77–5.28)
RDW: 15.3 % (ref 11.7–15.4)
WBC: 3.8 10*3/uL (ref 3.4–10.8)

## 2022-03-01 LAB — MICROALBUMIN / CREATININE URINE RATIO
Creatinine, Urine: 79.8 mg/dL
Microalb/Creat Ratio: 1587 mg/g creat — ABNORMAL HIGH (ref 0–29)
Microalbumin, Urine: 1266.5 ug/mL

## 2022-03-01 LAB — HEMOGLOBIN A1C
Est. average glucose Bld gHb Est-mCnc: 117 mg/dL
Hgb A1c MFr Bld: 5.7 % — ABNORMAL HIGH (ref 4.8–5.6)

## 2022-03-01 LAB — LIPID PANEL
Chol/HDL Ratio: 2.8 ratio (ref 0.0–4.4)
Cholesterol, Total: 125 mg/dL (ref 100–199)
HDL: 45 mg/dL (ref >39)
LDL Chol Calc (NIH): 58 mg/dL (ref 0–99)
Triglycerides: 124 mg/dL (ref 0–149)
VLDL Cholesterol Cal: 22 mg/dL (ref 5–40)

## 2022-03-01 LAB — VITAMIN B12: Vitamin B-12: 509 pg/mL (ref 232–1245)

## 2022-03-01 LAB — PTH, INTACT AND CALCIUM: PTH: 74 pg/mL — ABNORMAL HIGH (ref 15–65)

## 2022-03-08 ENCOUNTER — Other Ambulatory Visit: Payer: Self-pay

## 2022-03-08 MED ORDER — DAPAGLIFLOZIN PROPANEDIOL 5 MG PO TABS: 5.0000 mg | ORAL_TABLET | Freq: Every day | ORAL | 1 refills | Status: DC

## 2022-03-16 LAB — COLOGUARD: COLOGUARD: NEGATIVE

## 2022-03-19 ENCOUNTER — Other Ambulatory Visit: Payer: Self-pay

## 2022-03-19 MED ORDER — HUMALOG KWIKPEN 200 UNIT/ML ~~LOC~~ SOPN: 6.0000 [IU] | PEN_INJECTOR | Freq: Two times a day (BID) | SUBCUTANEOUS | 1 refills | Status: DC

## 2022-03-21 ENCOUNTER — Telehealth: Payer: Self-pay

## 2022-03-21 NOTE — Chronic Care Management (AMB) (Cosign Needed)
    Chronic Care Management Pharmacy Assistant   Name: Kathryn Walsh  MRN: [id number] DOB: 1956-09-21  Reason for Encounter: Patient assistance follow up   03-21-2022: Contacted [employer] to follow up on tresiba and was informed re enrollment application was never received. Contacted patient and she stated she never received application by mail in December. Patient is coming by [facility] today for samples and to sign application. Patient will also bring income verification.  Medications: Outpatient Encounter Medications as of 03/21/2022  Medication Sig   amLODipine (NORVASC) 5 MG tablet TAKE 1 TABLET BY MOUTH EVERY DAY   azaTHIOprine (IMURAN) 50 MG tablet Take 100 mg by mouth 2 (two) times daily.    Blood Glucose Monitoring Suppl (ACCU-CHEK GUIDE ME) w/Device KIT Use to check blood sugar 3 times a day. 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   cyclobenzaprine (FLEXERIL) 10 MG tablet TAKE 1 TABLET BY MOUTH EVERYDAY AT BEDTIME   dapagliflozin propanediol (FARXIGA) 5 MG TABS tablet Take 1 tablet (5 mg total) by mouth daily before breakfast.   glucose blood (ACCU-CHEK GUIDE) test strip Use as instructed to check blood sugar 3 times a day. 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 Pt doing 32 units qhs   insulin lispro (HUMALOG KWIKPEN) 200 UNIT/ML KwikPen Inject 6 Units into the skin 2 (two) times daily before a meal. Please inject 6 units before breakfast and dinner; max dose titration 40 units daily   pravastatin (PRAVACHOL) 40 MG tablet TAKE 1 TABLET BY MOUTH EVERYDAY AT BEDTIME   tacrolimus (PROGRAF) 1 MG capsule Take 2 mg in am and 3 mg in pm   No facility-administered encounter medications on file as of 03/21/2022.    [place] Pharmacist Assistant [phone]

## 2022-03-28 ENCOUNTER — Other Ambulatory Visit: Payer: Self-pay | Admitting: Internal Medicine

## 2022-03-28 ENCOUNTER — Telehealth: Payer: Self-pay

## 2022-03-28 DIAGNOSIS — Z1231 Encounter for screening mammogram for malignant neoplasm of breast: Secondary | ICD-10-CM

## 2022-03-28 NOTE — Chronic Care Management (AMB) (Cosign Needed)
Faxed re-enrollment application to [employer] patient assistance program for Tresiba 200 u/ml.  Pattricia Boss, [place] Pharmacist Assistant [phone]

## 2022-04-05 ENCOUNTER — Other Ambulatory Visit: Payer: Self-pay | Admitting: Physician Assistant

## 2022-04-06 NOTE — Telephone Encounter (Signed)
Called to review patient assistance.  Kathryn Walsh, CPP, PharmD Clinical Pharmacist Practitioner [facility] [phone]

## 2022-04-09 ENCOUNTER — Ambulatory Visit
Admission: RE | Admit: 2022-04-09 | Discharge: 2022-04-09 | Disposition: A | Discharge status: Home / Self Care | Payer: Medicare Other | Source: Ambulatory Visit | Attending: Internal Medicine | Admitting: Internal Medicine

## 2022-04-09 DIAGNOSIS — Z1231 Encounter for screening mammogram for malignant neoplasm of breast: Secondary | ICD-10-CM

## 2022-04-10 ENCOUNTER — Telehealth: Payer: Self-pay

## 2022-04-10 NOTE — Chronic Care Management (AMB) (Signed)
[employer]ovo [employer] patient assistance program notification:  April 02, 2022  Patient has been approved for [employer] patient assistance program for Tyler Aas and a 120 day supply should arrive to the office in  10-14 business days. Patient enrollment will end on 09/27/2022.  Pattricia Boss, [place] Pharmacist Assistant [phone]

## 2022-04-11 ENCOUNTER — Telehealth: Payer: Self-pay

## 2022-04-11 NOTE — Progress Notes (Cosign Needed)
04-11-2022: Patient was informed that tresiba is ready for pick up. [employer] will come by tomorrow.  [place] Pharmacist Assistant [phone]

## 2022-04-18 ENCOUNTER — Other Ambulatory Visit: Payer: Self-pay

## 2022-04-18 DIAGNOSIS — E1165 Type 2 diabetes mellitus with hyperglycemia: Secondary | ICD-10-CM

## 2022-04-24 DIAGNOSIS — K754 Autoimmune hepatitis: Secondary | ICD-10-CM | POA: Diagnosis not present

## 2022-04-24 DIAGNOSIS — K7400 Hepatic fibrosis, unspecified: Secondary | ICD-10-CM | POA: Diagnosis not present

## 2022-04-27 DIAGNOSIS — E119 Type 2 diabetes mellitus without complications: Secondary | ICD-10-CM | POA: Diagnosis not present

## 2022-04-27 LAB — HM DIABETES EYE EXAM

## 2022-05-08 ENCOUNTER — Encounter: Payer: Self-pay | Admitting: Internal Medicine

## 2022-05-30 ENCOUNTER — Telehealth: Payer: Medicare Other

## 2022-06-10 ENCOUNTER — Other Ambulatory Visit: Payer: Self-pay | Admitting: Internal Medicine

## 2022-06-26 ENCOUNTER — Telehealth: Payer: Self-pay

## 2022-06-26 NOTE — Chronic Care Management (AMB) (Signed)
    Chronic Care Management Pharmacy Assistant   Name: Kathryn Walsh  MRN: [id number] DOB: June 15, 1956  Reason for Encounter: Kathryn Walsh PAP  06-26-2022: Was told patient was out of tresiba from June delivery and only received 3 months worth. Contacted [employer] and was told last delivery was for 4 months and patient should have enough supply until October. If patient received a 3 month supply then it should've lasted until 07-12-2022. Patient was eligible for a free voucher but has to call [phone] to process it. Patient also has another delivery that will process on 06-29-2022 for a 4 month supply and will deliver in 10-14 days. [name] CPP stated patient recived a sample from the office. Left patient a VM to call and confirm usage of tresiba.  06-27-2022: Patient confirmed she is taking 43 units of tresiba. Completed and uploaded reorder form for dose change. Gave patient the phone number to request a free voucher and instructed her to call me back once she has it.     Medications: Outpatient Encounter Medications as of 06/26/2022  Medication Sig   amLODipine (NORVASC) 5 MG tablet TAKE 1 TABLET BY MOUTH EVERY DAY   azaTHIOprine (IMURAN) 50 MG tablet Take 100 mg by mouth 2 (two) times daily.    Blood Glucose Monitoring Suppl (ACCU-CHEK GUIDE ME) w/Device KIT Use to check blood sugar 3 times a day. 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   cyclobenzaprine (FLEXERIL) 10 MG tablet TAKE 1 TABLET BY MOUTH EVERYDAY AT BEDTIME   dapagliflozin propanediol (FARXIGA) 5 MG TABS tablet Take 1 tablet (5 mg total) by mouth daily before breakfast.   glucose blood (ACCU-CHEK GUIDE) test strip Use as instructed to check blood sugar 3 times a day. 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 Pt doing 32 units qhs   insulin lispro (HUMALOG KWIKPEN) 200 UNIT/ML KwikPen Inject 6 Units into  the skin 2 (two) times daily before a meal. Please inject 6 units before breakfast and dinner; max dose titration 40 units daily   pravastatin (PRAVACHOL) 40 MG tablet TAKE 1 TABLET BY MOUTH EVERYDAY AT BEDTIME   tacrolimus (PROGRAF) 1 MG capsule Take 2 mg in am and 3 mg in pm   No facility-administered encounter medications on file as of 06/26/2022.    [place] Pharmacist Assistant [phone]

## 2022-07-03 ENCOUNTER — Telehealth: Payer: Self-pay

## 2022-07-03 NOTE — Chronic Care Management (AMB) (Signed)
[employer] patient assistance program notification:  120- day supply of Tresiba 200 u/ml and Novofine needles will be filled on 06/29/2022 and  should arrive to the office in 10-14 business days.   Pattricia Boss, [place] Pharmacist Assistant [phone]

## 2022-07-04 ENCOUNTER — Ambulatory Visit (INDEPENDENT_AMBULATORY_CARE_PROVIDER_SITE_OTHER): Payer: Medicare Other | Admitting: Internal Medicine

## 2022-07-04 ENCOUNTER — Encounter: Payer: Self-pay | Admitting: Internal Medicine

## 2022-07-04 VITALS — BP 130/80 | HR 92 | Temp 98.8°F | Ht 69.2 in | Wt 166.6 lb

## 2022-07-04 DIAGNOSIS — Z23 Encounter for immunization: Secondary | ICD-10-CM | POA: Diagnosis not present

## 2022-07-04 DIAGNOSIS — R801 Persistent proteinuria, unspecified: Secondary | ICD-10-CM | POA: Diagnosis not present

## 2022-07-04 DIAGNOSIS — N289 Disorder of kidney and ureter, unspecified: Secondary | ICD-10-CM

## 2022-07-04 DIAGNOSIS — E785 Hyperlipidemia, unspecified: Secondary | ICD-10-CM | POA: Diagnosis not present

## 2022-07-04 DIAGNOSIS — N1831 Chronic kidney disease, stage 3a: Secondary | ICD-10-CM

## 2022-07-04 DIAGNOSIS — Z0000 Encounter for general adult medical examination without abnormal findings: Secondary | ICD-10-CM

## 2022-07-04 DIAGNOSIS — M4184 Other forms of scoliosis, thoracic region: Secondary | ICD-10-CM | POA: Diagnosis not present

## 2022-07-04 DIAGNOSIS — Z6824 Body mass index (BMI) 24.0-24.9, adult: Secondary | ICD-10-CM | POA: Diagnosis not present

## 2022-07-04 DIAGNOSIS — E1169 Type 2 diabetes mellitus with other specified complication: Secondary | ICD-10-CM

## 2022-07-04 DIAGNOSIS — E1165 Type 2 diabetes mellitus with hyperglycemia: Secondary | ICD-10-CM

## 2022-07-04 DIAGNOSIS — I10 Essential (primary) hypertension: Secondary | ICD-10-CM

## 2022-07-04 DIAGNOSIS — Z2821 Immunization not carried out because of patient refusal: Secondary | ICD-10-CM | POA: Diagnosis not present

## 2022-07-04 DIAGNOSIS — I129 Hypertensive chronic kidney disease with stage 1 through stage 4 chronic kidney disease, or unspecified chronic kidney disease: Secondary | ICD-10-CM

## 2022-07-04 MED ORDER — AMLODIPINE BESYLATE 5 MG PO TABS: 5.0000 mg | ORAL_TABLET | Freq: Every day | ORAL | 2 refills | Status: DC

## 2022-07-04 MED ORDER — TRESIBA FLEXTOUCH 200 UNIT/ML ~~LOC~~ SOPN: PEN_INJECTOR | SUBCUTANEOUS | 3 refills | Status: DC

## 2022-07-04 NOTE — Patient Instructions (Signed)

## 2022-07-04 NOTE — Progress Notes (Signed)
Kathryn Walsh,acting as a Education administrator for Kathryn Greenland, MD.,have documented all relevant documentation on the behalf of Kathryn Greenland, MD,as directed by  Kathryn Greenland, MD while in the presence of Kathryn Greenland, MD.   Subjective:     Patient ID: Kathryn Walsh , female    DOB: 1956/10/22 , 66 y.o.   MRN: [id number]   Chief Complaint  Patient presents with   Annual Exam   Diabetes   Hypertension    HPI  She is here today for a full physical exam. She is no longer followed by GYN.  She has no specific concerns or complaints at this time. She reports compliance with meds. She denies headaches, chest pain and shortness of breath.   Diabetes She presents for her follow-up diabetic visit. She has type 2 diabetes mellitus. Her disease course has been worsening. There are no hypoglycemic associated symptoms. Pertinent negatives for diabetes include no blurred vision. There are no hypoglycemic complications. Risk factors for coronary artery disease include diabetes mellitus, dyslipidemia, hypertension and sedentary lifestyle. Her weight is decreasing steadily. She is following a diabetic diet. She participates in exercise intermittently. Her breakfast blood glucose is taken between 9-10 am. Her breakfast blood glucose range is generally 140-180 mg/dl.  Hypertension This is a chronic problem. The current episode started more than 1 year ago. The problem has been gradually improving since onset. The problem is controlled. Pertinent negatives include no blurred vision. Risk factors for coronary artery disease include diabetes mellitus, dyslipidemia and post-menopausal state.     Past Medical History:  Diagnosis Date   Diabetes mellitus without complication (HCC)    High cholesterol    HTN (hypertension)    Liver transplant recipient [facility]      Family History  Problem Relation Age of Onset   Healthy Mother    Healthy Father    Heart disease Sister    Heart disease Brother     Gout Brother    Heart disease Brother    Healthy Daughter    Diabetes Son      Current Outpatient Medications:    azaTHIOprine (IMURAN) 50 MG tablet, Take 100 mg by mouth 2 (two) times daily. , Disp: , Rfl:    Blood Glucose Monitoring Suppl (ACCU-CHEK GUIDE ME) w/Device KIT, Use to check blood sugar 3 times a day. Dx code e11.65, Disp: 1 kit, Rfl: 3   Cholecalciferol 25 MCG (1000 UT) capsule, Take 4,000 Units by mouth daily., Disp: , Rfl:    cyclobenzaprine (FLEXERIL) 10 MG tablet, TAKE 1 TABLET BY MOUTH EVERYDAY AT BEDTIME, Disp: 30 tablet, Rfl: 0   glucose blood (ACCU-CHEK GUIDE) test strip, Use as instructed to check blood sugar 3 times a day. Dx code e11.65, Disp: 100 each, Rfl: 12   insulin lispro (HUMALOG KWIKPEN) 200 UNIT/ML KwikPen, Inject 6 Units into the skin 2 (two) times daily before a meal. Please inject 6 units before breakfast and dinner; max dose titration 40 units daily, Disp: 5 mL, Rfl: 1   pravastatin (PRAVACHOL) 40 MG tablet, TAKE 1 TABLET BY MOUTH EVERYDAY AT BEDTIME, Disp: 90 tablet, Rfl: 1   tacrolimus (PROGRAF) 1 MG capsule, Take 2 mg in am and 3 mg in pm, Disp: , Rfl:    amLODipine (NORVASC) 5 MG tablet, Take 1 tablet (5 mg total) by mouth daily., Disp: 90 tablet, Rfl: 2   insulin degludec (TRESIBA FLEXTOUCH) 200 UNIT/ML FlexTouch Pen, Inject 43 units nightly, Disp: 9 mL, Rfl: 3  No Known Allergies    The patient states she uses post menopausal status for birth control. Last LMP was No LMP recorded. Patient is postmenopausal.. Negative for Dysmenorrhea. Negative for: breast discharge, breast lump(s), breast pain and breast self exam. Associated symptoms include abnormal vaginal bleeding. Pertinent negatives include abnormal bleeding (hematology), anxiety, decreased libido, depression, difficulty falling sleep, dyspareunia, history of infertility, nocturia, sexual dysfunction, sleep disturbances, urinary incontinence, urinary urgency, vaginal discharge and vaginal  itching. Diet regular.The patient states her exercise level is  intermittent.  . The patient's tobacco use is:  Social History   Tobacco Use  Smoking Status Former   Packs/day: 0.75   Years: 40.00   Total pack years: 30.00   Types: Cigarettes   Start date: 11/12/1974  Smokeless Tobacco Never  Tobacco Comments   encouraged to avoid smoking in the house and in her car  . She has been exposed to passive smoke. The patient's alcohol use is:  Social History   Substance and Sexual Activity  Alcohol Use No   Review of Systems  Constitutional: Negative.   HENT: Negative.    Eyes: Negative.  Negative for blurred vision.  Respiratory: Negative.    Cardiovascular: Negative.   Gastrointestinal: Negative.   Endocrine: Negative.   Genitourinary: Negative.   Musculoskeletal: Negative.   Skin: Negative.   Allergic/Immunologic: Negative.   Neurological: Negative.   Hematological: Negative.   Psychiatric/Behavioral: Negative.       Today's Vitals   07/04/22 1418 07/04/22 1507  BP: (!) 160/90 130/80  Pulse: 92   Temp: 98.8 F (37.1 C)   Weight: 166 lb 9.6 oz (75.6 kg)   Height: 5' 9.2" (1.758 m)   PainSc: 0-No pain    Body mass index is 24.46 kg/m.  Wt Readings from Last 3 Encounters:  07/04/22 166 lb 9.6 oz (75.6 kg)  02/28/22 170 lb (77.1 kg)  09/14/21 177 lb (80.3 kg)     Objective:  Physical Exam Vitals and nursing note reviewed.  Constitutional:      Appearance: Normal appearance.  HENT:     Head: Normocephalic and atraumatic.     Right Ear: Tympanic membrane, ear canal and external ear normal.     Left Ear: Tympanic membrane, ear canal and external ear normal.     Nose: Nose normal.     Mouth/Throat:     Mouth: Mucous membranes are moist.     Pharynx: Oropharynx is clear.  Eyes:     Extraocular Movements: Extraocular movements intact.     Conjunctiva/sclera: Conjunctivae normal.     Pupils: Pupils are equal, round, and reactive to light.  Cardiovascular:      Rate and Rhythm: Normal rate and regular rhythm.     Pulses:          Dorsalis pedis pulses are 1+ on the right side and 1+ on the left side.     Heart sounds: Normal heart sounds.  Pulmonary:     Effort: Pulmonary effort is normal.     Breath sounds: Normal breath sounds.  Chest:  Breasts:    Tanner Score is 5.     Right: Normal.     Left: Normal.  Abdominal:     General: Abdomen is flat. Bowel sounds are normal.     Palpations: Abdomen is soft.  Genitourinary:    Comments: deferred Musculoskeletal:        General: Normal range of motion.     Cervical back: Normal range of motion and neck  supple.     Thoracic back: Scoliosis present.  Feet:     Right foot:     Protective Sensation: 5 sites tested.  5 sites sensed.     Skin integrity: Dry skin present.     Toenail Condition: Right toenails are long.     Left foot:     Protective Sensation: 5 sites tested.  5 sites sensed.     Skin integrity: Dry skin present.     Toenail Condition: Left toenails are long.  Skin:    General: Skin is warm and dry.  Neurological:     General: No focal deficit present.     Mental Status: She is alert and oriented to person, place, and time.  Psychiatric:        Mood and Affect: Mood normal.        Behavior: Behavior normal.         Assessment And Plan:     1. Encounter for general adult medical examination w/o abnormal findings Comments: A full exam was performed. Importance of monthly self breast exams was discussed with the patient. PATIENT IS ADVISED TO GET 30-45 MINUTES REGULAR EXERCISE NO LESS THAN FOUR TO FIVE DAYS PER WEEK - BOTH WEIGHTBEARING EXERCISES AND AEROBIC ARE RECOMMENDED.  PATIENT IS ADVISED TO FOLLOW A HEALTHY DIET WITH AT LEAST SIX FRUITS/VEGGIES PER DAY, DECREASE INTAKE OF RED MEAT, AND TO INCREASE FISH INTAKE TO TWO DAYS PER WEEK.  MEATS/FISH SHOULD NOT BE FRIED, BAKED OR BROILED IS PREFERABLE.  IT IS ALSO IMPORTANT TO CUT BACK ON YOUR SUGAR INTAKE. PLEASE AVOID ANYTHING  WITH ADDED SUGAR, CORN SYRUP OR OTHER SWEETENERS. IF YOU MUST USE A SWEETENER, YOU CAN TRY STEVIA. IT IS ALSO IMPORTANT TO AVOID ARTIFICIALLY SWEETENERS AND DIET BEVERAGES. LASTLY, I SUGGEST WEARING SPF 50 SUNSCREEN ON EXPOSED PARTS AND ESPECIALLY WHEN IN THE DIRECT SUNLIGHT FOR AN EXTENDED PERIOD OF TIME.  PLEASE AVOID FAST FOOD RESTAURANTS AND INCREASE YOUR WATER INTAKE.  2. Dyslipidemia associated with type 2 diabetes mellitus ([place]) Comments: Chronic, diabetic foot exam was performed. LDL goal is less than 70.  She will rto in 3-4 months for re-evaluation.  I DISCUSSED WITH THE PATIENT AT LENGTH REGARDING THE GOALS OF GLYCEMIC CONTROL AND POSSIBLE LONG-TERM COMPLICATIONS.  I  ALSO STRESSED THE IMPORTANCE OF COMPLIANCE WITH HOME GLUCOSE MONITORING, DIETARY RESTRICTIONS INCLUDING AVOIDANCE OF SUGARY DRINKS/PROCESSED FOODS,  ALONG WITH REGULAR EXERCISE.  I  ALSO STRESSED THE IMPORTANCE OF ANNUAL EYE EXAMS, SELF FOOT CARE AND COMPLIANCE WITH OFFICE VISITS.  - CBC - CMP14+EGFR - Lipid panel - Hemoglobin A1c - insulin degludec (TRESIBA FLEXTOUCH) 200 UNIT/ML FlexTouch Pen; Inject 43 units nightly  Dispense: 9 mL; Refill: 3  3. Parenchymal renal hypertension, stage 1 through stage 4 or unspecified chronic kidney disease Comments: Chronic, controlled. She will c/w amlodipine 23m daily.  EKG performed, NSR w/o acute changes. She will rto in 4 months for re-evaluation.  - EKG 12-Lead - amLODipine (NORVASC) 5 MG tablet; Take 1 tablet (5 mg total) by mouth daily.  Dispense: 90 tablet; Refill: 2  4. Stage 3a chronic kidney disease ([place] Comments: She is advised to avoid NSAIDs. I will refer her to Renal, will consider FIran She prefers to speak with Renal first due to possible side effects. - Protein electrophoresis, serum - Ambulatory referral to [place]phrology - [place]; Future  5. Dextroscoliosis of thoracic spine Comments: Chronic, will consider referral to spine specialist.   6. Persistent  proteinuria Comments: I will check  SPEP. She agrees to nephrology referral as well.  - US Renal; Future  7. BMI 24.0-24.9, adult Comments: She is encouraged to aim for at least 150 minutes of exercise per week.   8. Immunization due Comments: She agreed to [employer], billed via [employer].  - Zoster Recombinant (Shingrix )  9. Herpes zoster vaccination declined  Patient was given opportunity to ask questions. Patient verbalized understanding of the plan and was able to repeat key elements of the plan. All questions were answered to their satisfaction.   I, Kathryn Greenland, MD, have reviewed all documentation for this visit. The documentation on 07/04/22 for the exam, diagnosis, procedures, and orders are all accurate and complete.   THE PATIENT IS ENCOURAGED TO PRACTICE SOCIAL DISTANCING DUE TO THE COVID-19 PANDEMIC.

## 2022-07-05 ENCOUNTER — Telehealth: Payer: Self-pay

## 2022-07-05 NOTE — Chronic Care Management (AMB) (Signed)
07-05-2022: completed/ uploaded application for farxiga to be mailed.  [place] Pharmacist Assistant [phone]

## 2022-07-09 LAB — CMP14+EGFR
ALT: 9 IU/L (ref 0–32)
AST: 17 IU/L (ref 0–40)
Albumin/Globulin Ratio: 1.2 (ref 1.2–2.2)
Albumin: 4 g/dL (ref 3.9–4.9)
Alkaline Phosphatase: 151 IU/L — ABNORMAL HIGH (ref 44–121)
BUN/Creatinine Ratio: 18 (ref 12–28)
BUN: 21 mg/dL (ref 8–27)
Bilirubin Total: 0.4 mg/dL (ref 0.0–1.2)
CO2: 23 mmol/L (ref 20–29)
Calcium: 8.9 mg/dL (ref 8.7–10.3)
Chloride: 106 mmol/L (ref 96–106)
Creatinine, Ser: 1.15 mg/dL — ABNORMAL HIGH (ref 0.57–1.00)
Globulin, Total: 3.3 g/dL (ref 1.5–4.5)
Glucose: 94 mg/dL (ref 70–99)
Potassium: 4.4 mmol/L (ref 3.5–5.2)
Sodium: 141 mmol/L (ref 134–144)
Total Protein: 7.3 g/dL (ref 6.0–8.5)
eGFR: 53 mL/min/{1.73_m2} — ABNORMAL LOW (ref >59)

## 2022-07-09 LAB — CBC
Hematocrit: 31.7 % — ABNORMAL LOW (ref 34.0–46.6)
Hemoglobin: 10.8 g/dL — ABNORMAL LOW (ref 11.1–15.9)
MCH: 32.7 pg (ref 26.6–33.0)
MCHC: 34.1 g/dL (ref 31.5–35.7)
MCV: 96 fL (ref 79–97)
Platelets: 107 10*3/uL — ABNORMAL LOW (ref 150–450)
RBC: 3.3 x10E6/uL — ABNORMAL LOW (ref 3.77–5.28)
RDW: 14.8 % (ref 11.7–15.4)
WBC: 4.2 10*3/uL (ref 3.4–10.8)

## 2022-07-09 LAB — LIPID PANEL
Chol/HDL Ratio: 3.1 ratio (ref 0.0–4.4)
Cholesterol, Total: 135 mg/dL (ref 100–199)
HDL: 44 mg/dL (ref >39)
LDL Chol Calc (NIH): 67 mg/dL (ref 0–99)
Triglycerides: 140 mg/dL (ref 0–149)
VLDL Cholesterol Cal: 24 mg/dL (ref 5–40)

## 2022-07-09 LAB — PROTEIN ELECTROPHORESIS, SERUM
A/G Ratio: 0.9 (ref 0.7–1.7)
Albumin ELP: 3.5 g/dL (ref 2.9–4.4)
Alpha 1: 0.3 g/dL (ref 0.0–0.4)
Alpha 2: 0.7 g/dL (ref 0.4–1.0)
Beta: 1.1 g/dL (ref 0.7–1.3)
Gamma Globulin: 1.8 g/dL (ref 0.4–1.8)
Globulin, Total: 3.8 g/dL (ref 2.2–3.9)

## 2022-07-09 LAB — HEMOGLOBIN A1C
Est. average glucose Bld gHb Est-mCnc: 120 mg/dL
Hgb A1c MFr Bld: 5.8 % — ABNORMAL HIGH (ref 4.8–5.6)

## 2022-07-11 ENCOUNTER — Telehealth: Payer: Self-pay

## 2022-07-11 NOTE — Chronic Care Management (AMB) (Signed)
[employer] patient assistance program notification:  120- day supply of Tresiba 200 u/ml and Novofine needles was filled on 07/04/2022 and should arrive to the office in 10-14 business days. Patient has 1  refill remaining and enrollment will expire on 09/27/2022.  The next refill for patient will be fulfilled on 09/20/2022.   Pattricia Boss, [place] Pharmacist Assistant [phone]

## 2022-07-24 ENCOUNTER — Ambulatory Visit
Admission: RE | Admit: 2022-07-24 | Discharge: 2022-07-24 | Disposition: A | Discharge status: Home / Self Care | Payer: Medicare Other | Source: Ambulatory Visit | Attending: Internal Medicine | Admitting: Internal Medicine

## 2022-07-24 DIAGNOSIS — R809 Proteinuria, unspecified: Secondary | ICD-10-CM | POA: Diagnosis not present

## 2022-07-24 DIAGNOSIS — R801 Persistent proteinuria, unspecified: Secondary | ICD-10-CM

## 2022-07-24 DIAGNOSIS — N1831 Chronic kidney disease, stage 3a: Secondary | ICD-10-CM

## 2022-07-24 DIAGNOSIS — N189 Chronic kidney disease, unspecified: Secondary | ICD-10-CM | POA: Diagnosis not present

## 2022-07-24 DIAGNOSIS — N3289 Other specified disorders of bladder: Secondary | ICD-10-CM | POA: Diagnosis not present

## 2022-08-03 DIAGNOSIS — Z944 Liver transplant status: Secondary | ICD-10-CM | POA: Diagnosis not present

## 2022-08-03 DIAGNOSIS — D849 Immunodeficiency, unspecified: Secondary | ICD-10-CM | POA: Diagnosis not present

## 2022-08-07 ENCOUNTER — Encounter: Payer: Self-pay | Admitting: Internal Medicine

## 2022-08-07 ENCOUNTER — Ambulatory Visit (INDEPENDENT_AMBULATORY_CARE_PROVIDER_SITE_OTHER): Payer: Medicare Other | Admitting: Internal Medicine

## 2022-08-07 VITALS — BP 134/80 | HR 87 | Temp 98.3°F | Ht 69.2 in | Wt 166.4 lb

## 2022-08-07 DIAGNOSIS — Z2821 Immunization not carried out because of patient refusal: Secondary | ICD-10-CM | POA: Diagnosis not present

## 2022-08-07 DIAGNOSIS — I129 Hypertensive chronic kidney disease with stage 1 through stage 4 chronic kidney disease, or unspecified chronic kidney disease: Secondary | ICD-10-CM | POA: Insufficient documentation

## 2022-08-07 DIAGNOSIS — E1169 Type 2 diabetes mellitus with other specified complication: Secondary | ICD-10-CM

## 2022-08-07 DIAGNOSIS — N1831 Chronic kidney disease, stage 3a: Secondary | ICD-10-CM

## 2022-08-07 DIAGNOSIS — E785 Hyperlipidemia, unspecified: Secondary | ICD-10-CM | POA: Diagnosis not present

## 2022-08-07 MED ORDER — DAPAGLIFLOZIN PROPANEDIOL 10 MG PO TABS: 10.0000 mg | ORAL_TABLET | Freq: Every day | ORAL | 5 refills | Status: DC

## 2022-08-07 NOTE — Progress Notes (Signed)
Barnet Glasgow Martin,acting as a Education administrator for Maximino Greenland, MD.,have documented all relevant documentation on the behalf of Maximino Greenland, MD,as directed by  Maximino Greenland, MD while in the presence of Maximino Greenland, MD.    Subjective:     Patient ID: Kathryn Walsh , female    DOB: 13-Apr-1956 , 66 y.o.   MRN: [id number]   Chief Complaint  Patient presents with   Diabetes    HPI  She is here today for HTN and diabetes check. She was started on Farxiga 21m daily after her last visit. She has not had any issues with the medication. She reports compliance with meds. She denies headaches, chest pain and shortness of breath.  [facility] States she told them she needed to have kidney labs drawn.   Diabetes She presents for her follow-up diabetic visit. She has type 2 diabetes mellitus. Her disease course has been worsening. There are no hypoglycemic associated symptoms. Pertinent negatives for hypoglycemia include no dizziness or headaches. Pertinent negatives for diabetes include no blurred vision, no chest pain, no fatigue, no polydipsia, no polyphagia and no polyuria. There are no hypoglycemic complications. Risk factors for coronary artery disease include diabetes mellitus, dyslipidemia, hypertension and sedentary lifestyle. Her weight is decreasing steadily. She is following a generally healthy diet. When asked about meal planning, she reported none. She has not had a previous visit with a dietitian. She participates in exercise intermittently. Her breakfast blood glucose is taken between 9-10 am. Her breakfast blood glucose range is generally 140-180 mg/dl. She does not see a podiatrist.Eye exam current: Has a appt coming up. .  Hypertension This is a chronic problem. The current episode started more than 1 year ago. The problem has been gradually improving since onset. The problem is controlled. Pertinent negatives include no blurred  vision, chest pain, headaches, palpitations or shortness of breath. The current treatment provides moderate improvement. Compliance problems include exercise.      Past Medical History:  Diagnosis Date   Diabetes mellitus without complication (HCC)    High cholesterol    HTN (hypertension)    L[facility] recipient [facility]      Family History  Problem Relation Age of Onset   Healthy Mother    Healthy Father    Heart disease Sister    Heart disease Brother    Gout Brother    Heart disease Brother    Healthy Daughter    Diabetes Son      Current Outpatient Medications:    amLODipine (NORVASC) 5 MG tablet, Take 1 tablet (5 mg total) by mouth daily., Disp: 90 tablet, Rfl: 2   azaTHIOprine (IMURAN) 50 MG tablet, Take 100 mg by mouth 2 (two) times daily. , Disp: , Rfl:    Blood Glucose Monitoring Suppl (ACCU-CHEK GUIDE ME) w/Device KIT, Use to check blood sugar 3 times a day. Dx code e11.65, Disp: 1 kit, Rfl: 3   Cholecalciferol 25 MCG (1000 UT) capsule, Take 4,000 Units by mouth daily., Disp: , Rfl:    cyclobenzaprine (FLEXERIL) 10 MG tablet, TAKE 1 TABLET BY MOUTH EVERYDAY AT BEDTIME, Disp: 30 tablet, Rfl: 0   dapagliflozin propanediol (FARXIGA) 10 MG TABS tablet, Take 1 tablet (10 mg total) by mouth daily before breakfast., Disp: 30 tablet, Rfl: 5   glucose blood (ACCU-CHEK GUIDE) test strip, Use as instructed to check blood sugar 3 times a day. Dx code e11.65, Disp:  100 each, Rfl: 12   insulin degludec (TRESIBA FLEXTOUCH) 200 UNIT/ML FlexTouch Pen, Inject 43 units nightly, Disp: 9 mL, Rfl: 3   insulin lispro (HUMALOG KWIKPEN) 200 UNIT/ML KwikPen, Inject 6 Units into the skin 2 (two) times daily before a meal. Please inject 6 units before breakfast and dinner; max dose titration 40 units daily, Disp: 5 mL, Rfl: 1   pravastatin (PRAVACHOL) 40 MG tablet, TAKE 1 TABLET BY MOUTH EVERYDAY AT BEDTIME, Disp: 90 tablet, Rfl: 1   tacrolimus (PROGRAF) 1 MG capsule, Take 2 mg in am and 3 mg in  pm, Disp: , Rfl:    No Known Allergies   Review of Systems  Constitutional: Negative.  Negative for fatigue.  HENT: Negative.    Eyes: Negative.  Negative for blurred vision.  Respiratory: Negative.  Negative for shortness of breath.   Cardiovascular: Negative.  Negative for chest pain and palpitations.  Gastrointestinal: Negative.   Endocrine: Negative for polydipsia, polyphagia and polyuria.  Neurological:  Negative for dizziness and headaches.     Today's Vitals   08/07/22 1623 08/07/22 1653  BP: (!) 140/80 134/80  Pulse: 87   Temp: 98.3 F (36.8 C)   Weight: 166 lb 6.4 oz (75.5 kg)   Height: 5' 9.2" (1.758 m)   PainSc: 0-No pain    Body mass index is 24.43 kg/m.  Wt Readings from Last 3 Encounters:  08/07/22 166 lb 6.4 oz (75.5 kg)  07/04/22 166 lb 9.6 oz (75.6 kg)  02/28/22 170 lb (77.1 kg)    Objective:  Physical Exam Vitals and nursing note reviewed.  Constitutional:      Appearance: Normal appearance.  HENT:     Head: Normocephalic and atraumatic.  Eyes:     Extraocular Movements: Extraocular movements intact.  Cardiovascular:     Rate and Rhythm: Normal rate and regular rhythm.     Heart sounds: Normal heart sounds.  Pulmonary:     Effort: Pulmonary effort is normal.     Breath sounds: Normal breath sounds.  Musculoskeletal:     Cervical back: Normal range of motion.  Skin:    General: Skin is warm.  Neurological:     General: No focal deficit present.     Mental Status: She is alert.  Psychiatric:        Mood and Affect: Mood normal.        Behavior: Behavior normal.       Assessment And Plan:     1. Dyslipidemia associated with type 2 diabetes mellitus ([place]) Comments: She will c/w Farxiga 65m daily. Labs in [place] in [place], will be abstracted. She will rto in Jan 2024 for her next DM check.   2. Parenchymal renal hypertension, stage 1 through stage 4 or unspecified chronic kidney disease Comments: Initially  uncontrolled, second reading is improved. Goal BP<130/80, optimal BP<120/80. NO med changes.  3. Stage 3a chronic kidney disease ([place] Comments: Chronic, I will recheck urine microalbumin level in Dec 2023 to assess for improvement of proteinuria. She is awaiting appt with Nephrology.   4. Influenza vaccination declined   Patient was given opportunity to ask questions. Patient verbalized understanding of the plan and was able to repeat key elements of the plan. All questions were answered to their satisfaction.   I, RMaximino Greenland MD, have reviewed all documentation for this visit. The documentation on 08/07/22 for the exam, diagnosis, procedures, and orders are all accurate and complete.   IF YOU HAVE BEEN  REFERRED TO A SPECIALIST, IT MAY TAKE 1-2 WEEKS TO SCHEDULE/PROCESS THE REFERRAL. IF YOU HAVE NOT HEARD FROM US/SPECIALIST IN TWO WEEKS, PLEASE GIVE [place] A CALL AT [phone] X [phone].   THE PATIENT IS ENCOURAGED TO PRACTICE SOCIAL DISTANCING DUE TO THE COVID-19 PANDEMIC.

## 2022-08-07 NOTE — Patient Instructions (Addendum)

## 2022-08-21 ENCOUNTER — Telehealth: Payer: Self-pay

## 2022-08-21 NOTE — Telephone Encounter (Signed)
Patient made aware that her Tresiba (Insulin Degludec) is ready for pick up at the office. Patient voiced understanding.  [name], CPP, PharmD Clinical Pharmacist Practitioner [facility] [phone]

## 2022-09-10 ENCOUNTER — Encounter: Payer: Self-pay | Admitting: Internal Medicine

## 2022-09-10 ENCOUNTER — Ambulatory Visit (INDEPENDENT_AMBULATORY_CARE_PROVIDER_SITE_OTHER): Payer: Medicare Other | Admitting: Internal Medicine

## 2022-09-10 VITALS — BP 150/70 | HR 98 | Temp 98.7°F | Ht 69.0 in | Wt 165.2 lb

## 2022-09-10 DIAGNOSIS — E1169 Type 2 diabetes mellitus with other specified complication: Secondary | ICD-10-CM

## 2022-09-10 DIAGNOSIS — I129 Hypertensive chronic kidney disease with stage 1 through stage 4 chronic kidney disease, or unspecified chronic kidney disease: Secondary | ICD-10-CM

## 2022-09-10 DIAGNOSIS — E1122 Type 2 diabetes mellitus with diabetic chronic kidney disease: Secondary | ICD-10-CM

## 2022-09-10 DIAGNOSIS — N1831 Chronic kidney disease, stage 3a: Secondary | ICD-10-CM

## 2022-09-10 DIAGNOSIS — Z79899 Other long term (current) drug therapy: Secondary | ICD-10-CM

## 2022-09-10 DIAGNOSIS — Z794 Long term (current) use of insulin: Secondary | ICD-10-CM

## 2022-09-10 DIAGNOSIS — I10 Essential (primary) hypertension: Secondary | ICD-10-CM

## 2022-09-10 MED ORDER — TRESIBA FLEXTOUCH 200 UNIT/ML ~~LOC~~ SOPN: PEN_INJECTOR | SUBCUTANEOUS | 3 refills | Status: DC

## 2022-09-10 NOTE — Progress Notes (Signed)
Barnet Glasgow Martin,acting as a Education administrator for Maximino Greenland, MD.,have documented all relevant documentation on the behalf of Maximino Greenland, MD,as directed by  Maximino Greenland, MD while in the presence of Maximino Greenland, MD.    Subjective:     Patient ID: Kathryn Walsh , female    DOB: August 29, 1956 , 66 y.o.   MRN: [id number]   Chief Complaint  Patient presents with  . medication check    HPI  Patient presents today for  HTN. Patient states compliance with medications and has no other issues today. Patient states she has started the farixga and feels as if it is doing fine. BP Readings from Last 3 Encounters: 09/10/22 : (!) 150/70 08/07/22 : 134/80 07/04/22 : 130/80      Past Medical History:  Diagnosis Date  . Diabetes mellitus without complication ([place])   . High cholesterol   . HTN (hypertension)   . Liver transplant recipient [facility])      Family History  Problem Relation Age of Onset  . Healthy Mother   . Healthy Father   . Heart disease Sister   . Heart disease Brother   . Gout Brother   . Heart disease Brother   . Healthy Daughter   . Diabetes Son      Current Outpatient Medications:  .  amLODipine (NORVASC) 5 MG tablet, Take 1 tablet (5 mg total) by mouth daily., Disp: 90 tablet, Rfl: 2 .  azaTHIOprine (IMURAN) 50 MG tablet, Take 100 mg by mouth 2 (two) times daily. , Disp: , Rfl:  .  Blood Glucose Monitoring Suppl (ACCU-CHEK GUIDE ME) w/Device KIT, Use to check blood sugar 3 times a day. Dx code e11.65, Disp: 1 kit, Rfl: 3 .  Cholecalciferol 25 MCG (1000 UT) capsule, Take 4,000 Units by mouth daily., Disp: , Rfl:  .  cyclobenzaprine (FLEXERIL) 10 MG tablet, TAKE 1 TABLET BY MOUTH EVERYDAY AT BEDTIME, Disp: 30 tablet, Rfl: 0 .  dapagliflozin propanediol (FARXIGA) 10 MG TABS tablet, Take 1 tablet (10 mg total) by mouth daily before breakfast., Disp: 30 tablet, Rfl: 5 .  glucose blood (ACCU-CHEK GUIDE) test strip, Use as instructed to check blood sugar 3 times a  day. Dx code e11.65, Disp: 100 each, Rfl: 12 .  insulin degludec (TRESIBA FLEXTOUCH) 200 UNIT/ML FlexTouch Pen, Inject 43 units nightly, Disp: 9 mL, Rfl: 3 .  insulin lispro (HUMALOG KWIKPEN) 200 UNIT/ML KwikPen, Inject 6 Units into the skin 2 (two) times daily before a meal. Please inject 6 units before breakfast and dinner; max dose titration 40 units daily, Disp: 5 mL, Rfl: 1 .  pravastatin (PRAVACHOL) 40 MG tablet, TAKE 1 TABLET BY MOUTH EVERYDAY AT BEDTIME, Disp: 90 tablet, Rfl: 1 .  tacrolimus (PROGRAF) 1 MG capsule, Take 2 mg in am and 3 mg in pm, Disp: , Rfl:    No Known Allergies   Review of Systems   Today's Vitals   09/10/22 1605  BP: (!) 150/70  Pulse: 98  Temp: 98.7 F (37.1 C)  TempSrc: Oral  Weight: 165 lb 3.2 oz (74.9 kg)  Height: _0  (1.753 m)  PainSc: 0-No pain   Body mass index is 24.4 kg/m.  Wt Readings from Last 3 Encounters:  09/10/22 165 lb 3.2 oz (74.9 kg)  08/07/22 166 lb 6.4 oz (75.5 kg)  07/04/22 166 lb 9.6 oz (75.6 kg)    Objective:  Physical Exam      Assessment And Plan:  1. Uncontrolled type 2 diabetes mellitus with hyperglycemia ([place])  2. Essential hypertension     Patient was given opportunity to ask questions. Patient verbalized understanding of the plan and was able to repeat key elements of the plan. All questions were answered to their satisfaction.   I, Maximino Greenland, MD, have reviewed all documentation for this visit. The documentation on 09/10/22 for the exam, diagnosis, procedures, and orders are all accurate and complete.   IF YOU HAVE BEEN REFERRED TO A SPECIALIST, IT MAY TAKE 1-2 WEEKS TO SCHEDULE/PROCESS THE REFERRAL. IF YOU HAVE NOT HEARD FROM US/SPECIALIST IN TWO WEEKS, PLEASE GIVE [place] A CALL AT [phone] X [phone].   THE PATIENT IS ENCOURAGED TO PRACTICE SOCIAL DISTANCING DUE TO THE COVID-19 PANDEMIC.

## 2022-09-10 NOTE — Patient Instructions (Signed)

## 2022-09-11 LAB — MICROALBUMIN / CREATININE URINE RATIO
Creatinine, Urine: 78.3 mg/dL
Microalb/Creat Ratio: 1130 mg/g creat — ABNORMAL HIGH (ref 0–29)
Microalbumin, Urine: 884.9 ug/mL

## 2022-09-11 LAB — BMP8+EGFR
BUN/Creatinine Ratio: 21 (ref 12–28)
BUN: 25 mg/dL (ref 8–27)
CO2: 21 mmol/L (ref 20–29)
Calcium: 9 mg/dL (ref 8.7–10.3)
Chloride: 106 mmol/L (ref 96–106)
Creatinine, Ser: 1.17 mg/dL — ABNORMAL HIGH (ref 0.57–1.00)
Glucose: 186 mg/dL — ABNORMAL HIGH (ref 70–99)
Potassium: 4.6 mmol/L (ref 3.5–5.2)
Sodium: 139 mmol/L (ref 134–144)
eGFR: 51 mL/min/{1.73_m2} — ABNORMAL LOW (ref >59)

## 2022-09-27 ENCOUNTER — Ambulatory Visit: Payer: Medicare Other | Admitting: Internal Medicine

## 2022-09-27 ENCOUNTER — Ambulatory Visit (INDEPENDENT_AMBULATORY_CARE_PROVIDER_SITE_OTHER): Payer: Medicare Other

## 2022-09-27 VITALS — Ht 67.0 in | Wt 165.0 lb

## 2022-09-27 DIAGNOSIS — Z0000 Encounter for general adult medical examination without abnormal findings: Secondary | ICD-10-CM | POA: Diagnosis not present

## 2022-09-27 NOTE — Patient Instructions (Signed)
Ms. Conrey , Thank you for taking time to come for your Medicare Wellness Visit. I appreciate your ongoing commitment to your health goals. Please review the following plan we discussed and let me know if I can assist you in the future.   Screening recommendations/referrals: Colonoscopy: cologuard 03/06/2022, due 03/06/2025 Mammogram: completed 04/09/2022, due 04/11/2023 Bone Density: completed 07/06/2021 Recommended yearly ophthalmology/optometry visit for glaucoma screening and checkup Recommended yearly dental visit for hygiene and checkup  Vaccinations: Influenza vaccine: completed 08/31/2022 Pneumococcal vaccine: completed 12/16/2018 Tdap vaccine: due Shingles vaccine: needs second dose   Covid-19: 09/02/2021, 07/26/2020, 12/01/2019, 11/03/2019  Advanced directives: Please bring a copy of your POA (Power of Attorney) and/or Living Will to your next appointment.   Conditions/risks identified: none  Next appointment: Follow up in one year for your annual wellness visit    Preventive Care 65 Years and Older, Female Preventive care refers to lifestyle choices and visits with your health care provider that can promote health and wellness. What does preventive care include? A yearly physical exam. This is also called an annual well check. Dental exams once or twice a year. Routine eye exams. Ask your health care provider how often you should have your eyes checked. Personal lifestyle choices, including: Daily care of your teeth and gums. Regular physical activity. Eating a healthy diet. Avoiding tobacco and drug use. Limiting alcohol use. Practicing safe sex. Taking low-dose aspirin every day. Taking vitamin and mineral supplements as recommended by your health care provider. What happens during an annual well check? The services and screenings done by your health care provider during your annual well check will depend on your age, overall health, lifestyle risk factors, and family history  of disease. Counseling  Your health care provider may ask you questions about your: Alcohol use. Tobacco use. Drug use. Emotional well-being. Home and relationship well-being. Sexual activity. Eating habits. History of falls. Memory and ability to understand (cognition). Work and work Statistician. Reproductive health. Screening  You may have the following tests or measurements: Height, weight, and BMI. Blood pressure. Lipid and cholesterol levels. These may be checked every 5 years, or more frequently if you are over 73 years old. Skin check. Lung cancer screening. You may have this screening every year starting at age 52 if you have a 30-pack-year history of smoking and currently smoke or have quit within the past 15 years. Fecal occult blood test (FOBT) of the stool. You may have this test every year starting at age 2. Flexible sigmoidoscopy or colonoscopy. You may have a sigmoidoscopy every 5 years or a colonoscopy every 10 years starting at age 57. Hepatitis C blood test. Hepatitis B blood test. Sexually transmitted disease (STD) testing. Diabetes screening. This is done by checking your blood sugar (glucose) after you have not eaten for a while (fasting). You may have this done every 1-3 years. Bone density scan. This is done to screen for osteoporosis. You may have this done starting at age 52. Mammogram. This may be done every 1-2 years. Talk to your health care provider about how often you should have regular mammograms. Talk with your health care provider about your test results, treatment options, and if necessary, the need for more tests. Vaccines  Your health care provider may recommend certain vaccines, such as: Influenza vaccine. This is recommended every year. Tetanus, diphtheria, and acellular pertussis (Tdap, Td) vaccine. You may need a Td booster every 10 years. Zoster vaccine. You may need this after age 21. Pneumococcal 13-valent conjugate (  PCV13) vaccine. One  dose is recommended after age 4. Pneumococcal polysaccharide (PPSV23) vaccine. One dose is recommended after age 90. Talk to your health care provider about which screenings and vaccines you need and how often you need them. This information is not intended to replace advice given to you by your health care provider. Make sure you discuss any questions you have with your health care provider. Document Released: 11/11/2015 Document Revised: 07/04/2016 Document Reviewed: 08/16/2015 Elsevier Interactive Patient Education  2017 [place] Prevention in the Home Falls can cause injuries. They can happen to people of all ages. There are many things you can do to make your home safe and to help prevent falls. What can I do on the outside of my home? Regularly fix the edges of walkways and driveways and fix any cracks. Remove anything that might make you trip as you walk through a door, such as a raised step or threshold. Trim any bushes or trees on the path to your home. Use bright outdoor lighting. Clear any walking paths of anything that might make someone trip, such as rocks or tools. Regularly check to see if handrails are loose or broken. Make sure that both sides of any steps have handrails. Any raised decks and porches should have guardrails on the edges. Have any leaves, snow, or ice cleared regularly. Use sand or salt on walking paths during winter. Clean up any spills in your garage right away. This includes oil or grease spills. What can I do in the bathroom? Use night lights. Install grab bars by the toilet and in the tub and shower. Do not use towel bars as grab bars. Use non-skid mats or decals in the tub or shower. If you need to sit down in the shower, use a plastic, non-slip stool. Keep the floor dry. Clean up any water that spills on the floor as soon as it happens. Remove soap buildup in the tub or shower regularly. Attach bath mats securely with double-sided  non-slip rug tape. Do not have throw rugs and other things on the floor that can make you trip. What can I do in the bedroom? Use night lights. Make sure that you have a light by your bed that is easy to reach. Do not use any sheets or blankets that are too big for your bed. They should not hang down onto the floor. Have a firm chair that has side arms. You can use this for support while you get dressed. Do not have throw rugs and other things on the floor that can make you trip. What can I do in the kitchen? Clean up any spills right away. Avoid walking on wet floors. Keep items that you use a lot in easy-to-reach places. If you need to reach something above you, use a strong step stool that has a grab bar. Keep electrical cords out of the way. Do not use floor polish or wax that makes floors slippery. If you must use wax, use non-skid floor wax. Do not have throw rugs and other things on the floor that can make you trip. What can I do with my stairs? Do not leave any items on the stairs. Make sure that there are handrails on both sides of the stairs and use them. Fix handrails that are broken or loose. Make sure that handrails are as long as the stairways. Check any carpeting to make sure that it is firmly attached to the stairs. Fix any carpet that is  loose or worn. Avoid having throw rugs at the top or bottom of the stairs. If you do have throw rugs, attach them to the floor with carpet tape. Make sure that you have a light switch at the top of the stairs and the bottom of the stairs. If you do not have them, ask someone to add them for you. What else can I do to help prevent falls? Wear shoes that: Do not have high heels. Have rubber bottoms. Are comfortable and fit you well. Are closed at the toe. Do not wear sandals. If you use a stepladder: Make sure that it is fully opened. Do not climb a closed stepladder. Make sure that both sides of the stepladder are locked into place. Ask  someone to hold it for you, if possible. Clearly mark and make sure that you can see: Any grab bars or handrails. First and last steps. Where the edge of each step is. Use tools that help you move around (mobility aids) if they are needed. These include: Canes. Walkers. Scooters. Crutches. Turn on the lights when you go into a dark area. Replace any light bulbs as soon as they burn out. Set up your furniture so you have a clear path. Avoid moving your furniture around. If any of your floors are uneven, fix them. If there are any pets around you, be aware of where they are. Review your medicines with your doctor. Some medicines can make you feel dizzy. This can increase your chance of falling. Ask your doctor what other things that you can do to help prevent falls. This information is not intended to replace advice given to you by your health care provider. Make sure you discuss any questions you have with your health care provider. Document Released: 08/11/2009 Document Revised: 03/22/2016 Document Reviewed: 11/19/2014 Elsevier Interactive Patient Education  2017 [employer].

## 2022-09-27 NOTE — Progress Notes (Signed)
I connected with Kathryn Walsh today by telephone and verified that I am speaking with the correct person using two identifiers. Location patient: home Location provider: work Persons participating in the virtual visit: [name], [name] LPN.   I discussed the limitations, risks, security and privacy concerns of performing an evaluation and management service by telephone and the availability of in person appointments. I also discussed with the patient that there may be a patient responsible charge related to this service. The patient expressed understanding and verbally consented to this telephonic visit.    Interactive audio and video telecommunications were attempted between this provider and patient, however failed, due to patient having technical difficulties OR patient did not have access to video capability.  We continued and completed visit with audio only.     Vital signs may be patient reported or missing.  Subjective:   Kathryn Walsh is a 66 y.o. female who presents for Medicare Annual (Subsequent) preventive examination.  Review of Systems     Cardiac Risk Factors include: advanced age (>3mn, >>35women);diabetes mellitus;dyslipidemia;hypertension     Objective:    Today's Vitals   09/27/22 1441  Weight: 165 lb (74.8 kg)  Height: _0  (1.702 m)   Body mass index is 25.84 kg/m.     09/27/2022    2:46 PM 08/30/2021    3:00 PM 08/25/2020    3:36 PM 07/02/2019   10:33 AM 08/22/2017    7:02 PM 01/31/2017   11:06 AM 10/09/2014    3:24 PM  Advanced Directives  Does Patient Have a Medical Advance Directive? _1  No No  Type of Advance Directive Living will [place] will Living will H[place] AFBLiving will HEnterpriseLiving will    Copy of H[place]idin Chart?  No - copy requested  No - copy requested     Would patient like information on creating a  medical advance directive?      No - Patient declined No - patient declined information    Current Medications (verified) Outpatient Encounter Medications as of 09/27/2022  Medication Sig   amLODipine (NORVASC) 5 MG tablet Take 1 tablet (5 mg total) by mouth daily.   azaTHIOprine (IMURAN) 50 MG tablet Take 100 mg by mouth 2 (two) times daily.    Blood Glucose Monitoring Suppl (ACCU-CHEK GUIDE ME) w/Device KIT Use to check blood sugar 3 times a day. 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   cyclobenzaprine (FLEXERIL) 10 MG tablet TAKE 1 TABLET BY MOUTH EVERYDAY AT BEDTIME   dapagliflozin propanediol (FARXIGA) 10 MG TABS tablet Take 1 tablet (10 mg total) by mouth daily before breakfast.   glucose blood (ACCU-CHEK GUIDE) test strip Use as instructed to check blood sugar 3 times a day. Dx code e11.65   insulin degludec (TRESIBA FLEXTOUCH) 200 UNIT/ML FlexTouch Pen Inject 30 units nightly   pravastatin (PRAVACHOL) 40 MG tablet TAKE 1 TABLET BY MOUTH EVERYDAY AT BEDTIME   tacrolimus (PROGRAF) 1 MG capsule Take 2 mg in am and 3 mg in pm   TURMERIC PO Take by mouth.   insulin lispro (HUMALOG KWIKPEN) 200 UNIT/ML KwikPen Inject 6 Units into the skin 2 (two) times daily before a meal. Please inject 6 units before breakfast and dinner; max dose titration 40 units daily (Patient not taking: Reported on 09/27/2022)   No facility-administered encounter medications on file as  of 09/27/2022.    Allergies (verified) Patient has no known allergies.   History: Past Medical History:  Diagnosis Date   Diabetes mellitus without complication (HCC)    High cholesterol    HTN (hypertension)    Liver transplant recipient [facility])    Past Surgical History:  Procedure Laterality Date   LIVER TRANSPLANTATION     Family History  Problem Relation Age of Onset   Healthy Mother    Healthy Father    Heart disease Sister    Heart disease Brother    Gout  Brother    Heart disease Brother    Healthy Daughter    Diabetes Son    Social History   Socioeconomic History   Marital status: Single    Spouse name: Not on file   Number of children: Not on file   Years of education: Not on file   Highest education level: Not on file  Occupational History   Occupation: employed  Tobacco Use   Smoking status: Former    Packs/day: 0.75    Years: 40.00    Total pack years: 30.00    Types: Cigarettes    Start date: 11/12/1974   Smokeless tobacco: Never   Tobacco comments:    encouraged to avoid smoking in the house and in her car  Vaping Use   Vaping Use: Never used  Substance and Sexual Activity   Alcohol use: No   Drug use: No   Sexual activity: Not Currently  Other Topics Concern   Not on file  Social History Narrative   Not on file   Social Determinants of Health   Financial Resource Strain: Low Risk  (09/27/2022)   Overall Financial Resource Strain (CARDIA)    Difficulty of Paying Living Expenses: Not hard at all  Food Insecurity: No Food Insecurity (09/27/2022)   Hunger Vital Sign    Worried About Running Out of Food in the Last Year: Never true    [place] in the Last Year: Never true  Transportation Needs: No Transportation Needs (09/27/2022)   PRAPARE - Hydrologist (Medical): No    Lack of Transportation (Non-Medical): No  Physical Activity: Insufficiently Active (09/27/2022)   Exercise Vital Sign    Days of Exercise per Week: 3 days    Minutes of Exercise per Session: 20 min  Stress: No Stress Concern Present (09/27/2022)   [place]    Feeling of Stress : Not at all  Social Connections: Not on file    Tobacco Counseling Counseling given: Not Answered Tobacco comments: encouraged to avoid smoking in the house and in her car   Clinical Intake:  Pre-visit preparation completed: Yes  Pain : No/denies pain      Nutritional Status: BMI 25 -29 Overweight Nutritional Risks: None Diabetes: Yes  How often do you need to have someone help you when you read instructions, pamphlets, or other written materials from your doctor or pharmacy?: 1 - Never  Diabetic? Yes Nutrition Risk Assessment:  Has the patient had any N/V/D within the last 2 months?  No  Does the patient have any non-healing wounds?  No  Has the patient had any unintentional weight loss or weight gain?  No   Diabetes:  Is the patient diabetic?  Yes  If diabetic, was a CBG obtained today?  No  Did the patient bring in their glucometer from home?  No  How often  do you monitor your CBG's? Twice daily.   Financial Strains and Diabetes Management:  Are you having any financial strains with the device, your supplies or your medication? No .  Does the patient want to be seen by Chronic Care Management for management of their diabetes?  No  Would the patient like to be referred to a Nutritionist or for Diabetic Management?  No   Diabetic Exams:  Diabetic Eye Exam: Completed 04/27/2022 Diabetic Foot Exam: Completed 07/04/2022   Interpreter Needed?: No  Information entered by :: NAllen LPN   Activities of Daily Living    09/27/2022    2:47 PM  In your present state of health, do you have any difficulty performing the following activities:  Hearing? 0  Vision? 0  Difficulty concentrating or making decisions? 0  Walking or climbing stairs? 0  Comment only when back hurts  Dressing or bathing? 0  Doing errands, shopping? 0  Preparing Food and eating ? N  Using the Toilet? N  In the past six months, have you accidently leaked urine? N  Do you have problems with loss of bowel control? N  Managing your Medications? N  Managing your Finances? N  Housekeeping or managing your Housekeeping? N    Patient Care Team: Glendale Chard, MD as PCP - General (Internal Medicine) Mayford Knife, [facility] (Pharmacist) [facility] (Ophthalmology)  Indicate any recent Medical Services you may have received from other than [facility] providers in the past year (date may be approximate).     Assessment:   This is a routine wellness examination for Kathryn Walsh.  Hearing/Vision screen Vision Screening - Comments:: Regular eye exams, [facility]  Dietary issues and exercise activities discussed: Current Exercise Habits: Home exercise routine, Type of exercise: stretching, Time (Minutes): 20, Frequency (Times/Week): 3, Weekly Exercise (Minutes/Week): 60   Goals Addressed             This Visit's Progress    Patient Stated       09/27/2022, wants to get stronger       Depression Screen    09/27/2022    2:47 PM 09/10/2022    4:04 PM 08/30/2021    3:02 PM 08/25/2020    3:38 PM 07/02/2019   10:34 AM 04/07/2019    9:50 AM 12/08/2018   10:16 AM  PHQ 2/9 Scores  PHQ - 2 Score 0 0 0 0 0 0 0  PHQ- 9 Score     1      Fall Risk    09/27/2022    2:47 PM 09/10/2022    4:04 PM 08/30/2021    3:02 PM 08/25/2020    3:37 PM 07/02/2019   10:53 AM  [place] in the past year? 0 0 0 1 0  Comment    tripped over bag   Number falls in past yr: 0 0  0   Injury with Fall? 0 0  0   Risk for fall due to : Medication side effect No Fall Risks Medication side effect Medication side effect   Follow up Falls prevention discussed;Education provided;Falls evaluation completed Falls evaluation completed Falls evaluation completed;Education provided;Falls prevention discussed Falls evaluation completed;Education provided;Falls prevention discussed     FALL RISK PREVENTION PERTAINING TO THE HOME:  Any stairs in or around the home? Yes  If so, are there any without handrails? Yes  Home free of loose throw rugs in walkways, pet beds, electrical cords, etc? Yes  Adequate lighting in your home to reduce risk of falls? Yes   ASSISTIVE DEVICES UTILIZED TO PREVENT FALLS:  Life alert? No  Use of a cane, walker or w/c? No   Grab bars in the bathroom? No  Shower chair or bench in shower? Yes  Elevated toilet seat or a handicapped toilet? No   TIMED UP AND GO:  Was the test performed? No .       Cognitive Function:        09/27/2022    2:50 PM 08/30/2021    3:04 PM 08/25/2020    3:39 PM 07/02/2019   10:37 AM  6CIT Screen  What Year? 0 points 0 points 0 points 0 points  What month? 0 points 0 points 0 points 0 points  What time? 0 points 0 points 0 points 0 points  Count back from 20 2 points 0 points 0 points 0 points  Months in reverse 0 points 0 points 0 points 0 points  Repeat phrase 0 points 2 points 0 points 0 points  Total Score 2 points 2 points 0 points 0 points    Immunizations Immunization History  Administered Date(s) Administered   DTaP 06/10/2012   Influenza, High Dose Seasonal PF 08/16/2021   Influenza,inj,Quad PF,6+ Mos 10/06/2013, 08/12/2015, 08/01/2018, 07/02/2019   Influenza-Unspecified 08/18/2012, 08/06/2018, 07/26/2020   Moderna Sars-Covid-2 Vaccination 11/03/2019, 12/01/2019, 07/26/2020   Pfizer Covid-19 Vaccine Bivalent Booster 37yr & up 09/02/2021   Pneumococcal Conjugate-13 08/12/2015   Pneumococcal Polysaccharide-23 07/13/2013, 12/16/2018   Zoster Recombinat (Shingrix) 07/04/2022    TDAP status: Due, Education has been provided regarding the importance of this vaccine. Advised may receive this vaccine at local pharmacy or Health Dept. Aware to provide a copy of the vaccination record if obtained from local pharmacy or Health Dept. Verbalized acceptance and understanding.  Flu Vaccine status: Up to date  Pneumococcal vaccine status: Up to date  Covid-19 vaccine status: Completed vaccines  Qualifies for Shingles Vaccine? Yes   Zostavax completed No   Shingrix Completed?: needs second dose  Screening Tests Health Maintenance  Topic Date Due   DTaP/Tdap/Td (2 - Tdap) 06/10/2022   Zoster Vaccines- Shingrix (2 of 2) 08/29/2022   Medicare Annual Wellness (AWV)   08/30/2022   COVID-19 Vaccine (5 - 2023-24 season) 10/13/2022 (Originally 06/29/2022)   INFLUENZA VACCINE  01/27/2023 (Originally 05/29/2022)   Lung Cancer Screening  09/16/2023 (Originally 11/21/2005)   HEMOGLOBIN A1C  01/02/2023   OPHTHALMOLOGY EXAM  04/28/2023   FOOT EXAM  07/05/2023   Diabetic kidney evaluation - GFR measurement  09/11/2023   Diabetic kidney evaluation - Urine ACR  09/11/2023   Pneumonia Vaccine 66 Years old (4 - PPSV23 or PCV20) 12/17/2023   MAMMOGRAM  04/09/2024   Fecal DNA (Cologuard)  03/06/2025   DEXA SCAN  Completed   Hepatitis C Screening  Completed   HPV VACCINES  Aged Out    Health Maintenance  Health Maintenance Due  Topic Date Due   DTaP/Tdap/Td (2 - Tdap) 06/10/2022   Zoster Vaccines- Shingrix (2 of 2) 08/29/2022   Medicare Annual Wellness (AWV)  08/30/2022    Colorectal cancer screening: Type of screening: Cologuard. Completed 03/06/2022. Repeat every 3 years  Mammogram status: Completed 04/09/2022. Repeat every year  Bone Density status: Completed 07/06/2021.   Lung Cancer Screening: (Low Dose CT Chest recommended if Age 66-80years, 30 pack-year currently smoking OR have quit w/in 15years.) does not qualify.   Lung Cancer Screening Referral: no  Additional Screening:  Hepatitis  C Screening: does qualify; Completed 09/14/2021  Vision Screening: Recommended annual ophthalmology exams for early detection of glaucoma and other disorders of the eye. Is the patient up to date with their annual eye exam?  Yes  Who is the provider or what is the name of the office in which the patient attends annual eye exams? [facility] If pt is not established with a provider, would they like to be referred to a provider to establish care? No .   Dental Screening: Recommended annual dental exams for proper oral hygiene  Community Resource Referral / Chronic Care Management: CRR required this visit?  No   CCM required this visit?  No      Plan:     I  have personally reviewed and noted the following in the patient's chart:   Medical and social history Use of alcohol, tobacco or illicit drugs  Current medications and supplements including opioid prescriptions. Patient is not currently taking opioid prescriptions. Functional ability and status Nutritional status Physical activity Advanced directives List of other physicians Hospitalizations, surgeries, and ER visits in previous 12 months Vitals Screenings to include cognitive, depression, and falls Referrals and appointments  In addition, I have reviewed and discussed with patient certain preventive protocols, quality metrics, and best practice recommendations. A written personalized care plan for preventive services as well as general preventive health recommendations were provided to patient.     Kellie Simmering, LPN   [id number]   Nurse Notes: none  Due to this being a virtual visit, the after visit summary with patients personalized plan was offered to patient via mail or my-chart.  to pick up at office at next visit

## 2022-10-09 ENCOUNTER — Ambulatory Visit: Payer: Medicare Other

## 2022-10-11 DIAGNOSIS — Z944 Liver transplant status: Secondary | ICD-10-CM | POA: Diagnosis not present

## 2022-10-11 DIAGNOSIS — D849 Immunodeficiency, unspecified: Secondary | ICD-10-CM | POA: Diagnosis not present

## 2022-10-15 ENCOUNTER — Telehealth: Payer: Self-pay

## 2022-10-15 NOTE — Telephone Encounter (Signed)
Pt notified that her pen needles have arrived here at the office. YL,RMA

## 2022-10-30 ENCOUNTER — Ambulatory Visit: Payer: Self-pay | Admitting: Internal Medicine

## 2022-11-26 ENCOUNTER — Telehealth: Payer: Self-pay

## 2022-11-26 NOTE — Progress Notes (Addendum)
Faxed 2024 re-enrollment application to [employer] patient assistance program for [place] and Novofine needles.    Pattricia Boss, [place] Pharmacist Assistant [phone]

## 2022-11-30 ENCOUNTER — Telehealth: Payer: Self-pay

## 2022-11-30 NOTE — Telephone Encounter (Signed)
Called patient to inform her that her pen needles were ready for pick up at the office. Patient voiced understanding.  [name], CPP, PharmD Clinical Pharmacist Practitioner [facility] [phone]

## 2022-12-04 ENCOUNTER — Telehealth: Payer: Self-pay

## 2022-12-04 NOTE — Progress Notes (Addendum)
Patient approved to receive [place] and Novofine needles through [employer] patient assistance, enrollment ends 10/29/2023.   Pattricia Boss, [place] Pharmacist Assistant [phone]

## 2022-12-06 ENCOUNTER — Ambulatory Visit (INDEPENDENT_AMBULATORY_CARE_PROVIDER_SITE_OTHER): Payer: Medicare Other | Admitting: Internal Medicine

## 2022-12-06 ENCOUNTER — Encounter: Payer: Self-pay | Admitting: Internal Medicine

## 2022-12-06 VITALS — BP 140/76 | HR 78 | Temp 98.4°F | Ht 67.0 in | Wt 163.0 lb

## 2022-12-06 DIAGNOSIS — E1122 Type 2 diabetes mellitus with diabetic chronic kidney disease: Secondary | ICD-10-CM

## 2022-12-06 DIAGNOSIS — N1831 Chronic kidney disease, stage 3a: Secondary | ICD-10-CM

## 2022-12-06 DIAGNOSIS — Z6825 Body mass index (BMI) 25.0-25.9, adult: Secondary | ICD-10-CM | POA: Diagnosis not present

## 2022-12-06 DIAGNOSIS — Z794 Long term (current) use of insulin: Secondary | ICD-10-CM | POA: Diagnosis not present

## 2022-12-06 DIAGNOSIS — I129 Hypertensive chronic kidney disease with stage 1 through stage 4 chronic kidney disease, or unspecified chronic kidney disease: Secondary | ICD-10-CM

## 2022-12-06 DIAGNOSIS — Z23 Encounter for immunization: Secondary | ICD-10-CM

## 2022-12-06 LAB — CMP14+EGFR
ALT: 8 IU/L (ref 0–32)
AST: 18 IU/L (ref 0–40)
Albumin/Globulin Ratio: 1.3 (ref 1.2–2.2)
Albumin: 3.7 g/dL — ABNORMAL LOW (ref 3.9–4.9)
Alkaline Phosphatase: 175 IU/L — ABNORMAL HIGH (ref 44–121)
BUN/Creatinine Ratio: 21 (ref 12–28)
BUN: 25 mg/dL (ref 8–27)
Bilirubin Total: 0.5 mg/dL (ref 0.0–1.2)
CO2: 21 mmol/L (ref 20–29)
Calcium: 8.8 mg/dL (ref 8.7–10.3)
Chloride: 106 mmol/L (ref 96–106)
Creatinine, Ser: 1.18 mg/dL — ABNORMAL HIGH (ref 0.57–1.00)
Globulin, Total: 2.8 g/dL (ref 1.5–4.5)
Glucose: 111 mg/dL — ABNORMAL HIGH (ref 70–99)
Potassium: 4.2 mmol/L (ref 3.5–5.2)
Sodium: 141 mmol/L (ref 134–144)
Total Protein: 6.5 g/dL (ref 6.0–8.5)
eGFR: 51 mL/min/{1.73_m2} — ABNORMAL LOW (ref >59)

## 2022-12-06 LAB — HEMOGLOBIN A1C
Est. average glucose Bld gHb Est-mCnc: 111 mg/dL
Hgb A1c MFr Bld: 5.5 % (ref 4.8–5.6)

## 2022-12-06 LAB — CBC
Hematocrit: 30.3 % — ABNORMAL LOW (ref 34.0–46.6)
Hemoglobin: 10.5 g/dL — ABNORMAL LOW (ref 11.1–15.9)
MCH: 32.6 pg (ref 26.6–33.0)
MCHC: 34.7 g/dL (ref 31.5–35.7)
MCV: 94 fL (ref 79–97)
Platelets: 112 10*3/uL — ABNORMAL LOW (ref 150–450)
RBC: 3.22 x10E6/uL — ABNORMAL LOW (ref 3.77–5.28)
RDW: 14.1 % (ref 11.7–15.4)
WBC: 3.9 10*3/uL (ref 3.4–10.8)

## 2022-12-06 NOTE — Patient Instructions (Addendum)
Your blood pressure is elevated. I want you to start taking at BOTH breakfast and dinner.   Type 2 Diabetes Mellitus, Diagnosis, Adult Type 2 diabetes (type 2 diabetes mellitus) is a long-term (chronic) disease. It may happen when there is one or both of these problems: The pancreas does not make enough insulin. The body does not react in a normal way to insulin that it makes. Insulin lets sugars go into cells in your body. If you have type 2 diabetes, sugars cannot get into your cells. Sugars build up in the blood. This causes high blood sugar. What are the causes? The exact cause of this condition is not known. What increases the risk? Having type 2 diabetes in your family. Being overweight or very overweight. Not being active. Your body not reacting in a normal way to the insulin it makes. Having higher than normal blood sugar over time. Having a type of diabetes when you were pregnant. Having a condition that causes small fluid-filled sacs on your ovaries. What are the signs or symptoms? At first, you may have no symptoms. You will get symptoms slowly. They may include: More thirst than normal. More hunger than normal. Needing to pee more than normal. Losing weight without trying. Feeling tired. Feeling weak. Seeing things blurry. Dark patches on your skin. How is this treated? This condition may be treated by a diabetes expert. You may need to: Follow an eating plan made by a food expert (dietitian). Get regular exercise. Find ways to deal with stress. Check blood sugar as often as told. Take medicines. Your doctor will set treatment goals for you. Your blood sugar should be at these levels: Before meals: 80-130 mg/dL (4.4-7.2 mmol/L). After meals: below 180 mg/dL (10 mmol/L). Over the last 2-3 months: less than 7%. Follow these instructions at home: Medicines Take your diabetes medicines or insulin every day. Take medicines as told to help you prevent other problems  caused by this condition. You may need: Aspirin. Medicine to lower cholesterol. Medicine to control blood pressure. Questions to ask your doctor Should I meet with a diabetes educator? What medicines do I need, and when should I take them? What will I need to treat my condition at home? When should I check my blood sugar? Where can I find a support group? Who can I call if I have questions? When is my next doctor visit? General instructions Take over-the-counter and prescription medicines only as told by your doctor. Keep all follow-up visits. Where to find more information For help and guidance and more information about diabetes, please go to: American Diabetes Association (ADA): www.diabetes.org American Association of Diabetes Care and Education Specialists (ADCES): www.diabeteseducator.org International Diabetes Federation (IDF): MemberVerification.ca Contact a doctor if: Your blood sugar is at or above 240 mg/dL (13.3 mmol/L) for 2 days in a row. You have been sick for 2 days or more, and you are not getting better. You have had a fever for 2 days or more, and you are not getting better. You have any of these problems for more than 6 hours: You cannot eat or drink. You feel like you may vomit. You vomit. You have watery poop (diarrhea). Get help right away if: Your blood sugar is lower than 54 mg/dL (3 mmol/L). You feel mixed up (confused). You have trouble thinking clearly. You have trouble breathing. You have medium or large ketone levels in your pee. These symptoms may be an emergency. Get help right away. Call your local emergency services (911  in the [place] Do not wait to see if the symptoms will go away. Do not drive yourself to the hospital. Summary Type 2 diabetes is a long-term disease. Your pancreas may not make enough insulin, or your body may not react in a normal way to insulin that it makes. This condition is treated with an eating plan, lifestyle changes, and  medicines. Your doctor will set treatment goals for you. These will help you keep your blood sugar in a healthy range. Keep all follow-up visits. This information is not intended to replace advice given to you by your health care provider. Make sure you discuss any questions you have with your health care provider. Document Revised: 01/09/2021 Document Reviewed: 01/09/2021 Elsevier Patient Education  [place].

## 2022-12-06 NOTE — Progress Notes (Signed)
I,[name],acting as a scribe for Maximino Greenland, MD.,have documented all relevant documentation on the behalf of Maximino Greenland, MD,as directed by  Maximino Greenland, MD while in the presence of Maximino Greenland, MD.    Subjective:     Patient ID: Kathryn Walsh , female    DOB: 19-Jan-1956 , 67 y.o.   MRN: [id number]   Chief Complaint  Patient presents with   Diabetes   Hypertension    HPI  Patient presents today for DM & HTN f/u. She denies having any headaches, chest pain and shortness of breath.  She denies having any other concerns at this time. She has yet to see nephrologist. She was referred for stage 3a CKD. She states "I am not claiming any kidney issues". She does report compliance with [place].      Diabetes She presents for her follow-up diabetic visit. She has type 2 diabetes mellitus. Her disease course has been worsening. There are no hypoglycemic associated symptoms. Pertinent negatives for hypoglycemia include no dizziness or headaches. Pertinent negatives for diabetes include no blurred vision, no chest pain, no fatigue, no polydipsia, no polyphagia and no polyuria. There are no hypoglycemic complications. Risk factors for coronary artery disease include diabetes mellitus, dyslipidemia, hypertension and sedentary lifestyle. Her weight is decreasing steadily. She is following a generally healthy diet. When asked about meal planning, she reported none. She has not had a previous visit with a dietitian. She participates in exercise intermittently. Her breakfast blood glucose is taken between 9-10 am. Her breakfast blood glucose range is generally 140-180 mg/dl. She does not see a podiatrist.Eye exam current: Has a appt coming up. .  Hypertension This is a chronic problem. The current episode started more than 1 year ago. The problem has been gradually improving since onset. The problem is controlled. Pertinent negatives include no blurred vision, chest pain,  headaches, palpitations or shortness of breath. The current treatment provides moderate improvement. Compliance problems include exercise.      Past Medical History:  Diagnosis Date   Diabetes mellitus without complication (HCC)    High cholesterol    HTN (hypertension)    Liver transplant recipient [facility])      Family History  Problem Relation Age of Onset   Healthy Mother    Healthy Father    Heart disease Sister    Heart disease Brother    Gout Brother    Heart disease Brother    Healthy Daughter    Diabetes Son      Current Outpatient Medications:    amLODipine (NORVASC) 5 MG tablet, Take 1 tablet (5 mg total) by mouth daily., Disp: 90 tablet, Rfl: 2   azaTHIOprine (IMURAN) 50 MG tablet, Take 100 mg by mouth 2 (two) times daily. , Disp: , Rfl:    Blood Glucose Monitoring Suppl (ACCU-CHEK GUIDE ME) w/Device KIT, Use to check blood sugar 3 times a day. Dx code e11.65, Disp: 1 kit, Rfl: 3   Cholecalciferol 25 MCG (1000 UT) capsule, Take 4,000 Units by mouth daily., Disp: , Rfl:    cyclobenzaprine (FLEXERIL) 10 MG tablet, TAKE 1 TABLET BY MOUTH EVERYDAY AT BEDTIME, Disp: 30 tablet, Rfl: 0   dapagliflozin propanediol (FARXIGA) 10 MG TABS tablet, Take 1 tablet (10 mg total) by mouth daily before breakfast., Disp: 30 tablet, Rfl: 5   glucose blood (ACCU-CHEK GUIDE) test strip, Use as instructed to check blood sugar 3 times a day. Dx code e11.65, Disp: 100 each, Rfl: 12  tacrolimus (PROGRAF) 1 MG capsule, Take 2 mg in am and 3 mg in pm, Disp: , Rfl:    TURMERIC PO, Take by mouth., Disp: , Rfl:    insulin degludec (TRESIBA FLEXTOUCH) 200 UNIT/ML FlexTouch Pen, Inject 25 units nightly, Disp: 9 mL, Rfl: 3   insulin lispro (HUMALOG KWIKPEN) 200 UNIT/ML KwikPen, Inject 6 Units into the skin 2 (two) times daily before a meal. Please inject 6 units before breakfast and dinner; max dose titration 40 units daily (Patient not taking: Reported on 12/06/2022), Disp: 5 mL, Rfl: 1   pravastatin  (PRAVACHOL) 40 MG tablet, TAKE 1 TABLET BY MOUTH EVERYDAY AT BEDTIME, Disp: 90 tablet, Rfl: 1   No Known Allergies   Review of Systems  Constitutional: Negative.  Negative for fatigue.  Eyes:  Negative for blurred vision.  Respiratory: Negative.  Negative for shortness of breath.   Cardiovascular: Negative.  Negative for chest pain and palpitations.  Endocrine: Negative for polydipsia, polyphagia and polyuria.  Neurological: Negative.  Negative for dizziness and headaches.  Psychiatric/Behavioral: Negative.       Today's Vitals   12/06/22 0948 12/06/22 1004  BP: (!) 160/78 (!) 140/76  Pulse: 78   Temp: 98.4 F (36.9 C)   SpO2: 98%   Weight: 163 lb (73.9 kg)   Height: 5' 7"$  (1.702 m)    Body mass index is 25.53 kg/m.  Wt Readings from Last 3 Encounters:  12/06/22 163 lb (73.9 kg)  09/27/22 165 lb (74.8 kg)  09/10/22 165 lb 3.2 oz (74.9 kg)    Objective:  Physical Exam Vitals and nursing note reviewed.  Constitutional:      Appearance: Normal appearance.  HENT:     Head: Normocephalic and atraumatic.     Nose:     Comments: Masked     Mouth/Throat:     Comments: Masked  Eyes:     Extraocular Movements: Extraocular movements intact.  Cardiovascular:     Rate and Rhythm: Normal rate and regular rhythm.     Heart sounds: Normal heart sounds.  Pulmonary:     Effort: Pulmonary effort is normal.     Breath sounds: Normal breath sounds.  Musculoskeletal:     Cervical back: Normal range of motion.  Skin:    General: Skin is warm.  Neurological:     General: No focal deficit present.     Mental Status: She is alert.  Psychiatric:        Mood and Affect: Mood normal.        Behavior: Behavior normal.         Assessment And Plan:     1. Type 2 diabetes mellitus with stage 3a chronic kidney disease, with long-term current use of insulin (HCC) Comments: Chronic, I will check labs as below. She will c/w [place] 25 units nightly and Farxiga 53m. I will adjust meds  further after reviewing her labs. - Microalbumin / creatinine urine ratio - CMP14+EGFR - CBC - Hemoglobin A1c  2. Parenchymal renal hypertension, stage 1 through stage 4 or unspecified chronic kidney disease Comments: Uncontrolled. I will increase amlodipine to 588mtwice daily.  She agrees to rto in 2-3 weeks for a nurse visit. - Microalbumin / creatinine urine ratio - CMP14+EGFR - CBC - Hemoglobin A1c  3. Stage 3a chronic kidney disease ([place]: Unfortunately, she hasn't seen [facility] yet. She does not wish to see them b/c she is not "claiming CKD". She is encouraged to call them back to schedule appt.  4. Body mass index (BMI) of 25.0 to 25.9 in adult Comments: She is encouraged to aim for at least 150 minutes of exercise per week.  5. Need for shingles vaccine Comments: She was given Shingrix IM x1, billed via [facility]. - Zoster Recombinant (Shingrix )   Patient was given opportunity to ask questions. Patient verbalized understanding of the plan and was able to repeat key elements of the plan. All questions were answered to their satisfaction.   I, Maximino Greenland, MD, have reviewed all documentation for this visit. The documentation on 12/06/22 for the exam, diagnosis, procedures, and orders are all accurate and complete.   IF YOU HAVE BEEN REFERRED TO A SPECIALIST, IT MAY TAKE 1-2 WEEKS TO SCHEDULE/PROCESS THE REFERRAL. IF YOU HAVE NOT HEARD FROM US/SPECIALIST IN TWO WEEKS, PLEASE GIVE [place] A CALL AT [phone].   THE PATIENT IS ENCOURAGED TO PRACTICE SOCIAL DISTANCING DUE TO THE COVID-19 PANDEMIC.

## 2022-12-07 ENCOUNTER — Other Ambulatory Visit: Payer: Self-pay | Admitting: Internal Medicine

## 2022-12-08 LAB — MICROALBUMIN / CREATININE URINE RATIO
Creatinine, Urine: 86.6 mg/dL
Microalb/Creat Ratio: 3678 mg/g creat — ABNORMAL HIGH (ref 0–29)
Microalbumin, Urine: 3184.9 ug/mL

## 2022-12-10 ENCOUNTER — Other Ambulatory Visit: Payer: Self-pay | Admitting: Internal Medicine

## 2022-12-10 DIAGNOSIS — N1831 Chronic kidney disease, stage 3a: Secondary | ICD-10-CM

## 2022-12-10 MED ORDER — TRESIBA FLEXTOUCH 200 UNIT/ML ~~LOC~~ SOPN: PEN_INJECTOR | SUBCUTANEOUS | 3 refills | Status: DC

## 2023-01-01 ENCOUNTER — Ambulatory Visit: Payer: Self-pay

## 2023-01-14 DIAGNOSIS — D849 Immunodeficiency, unspecified: Secondary | ICD-10-CM | POA: Diagnosis not present

## 2023-01-14 DIAGNOSIS — Z5181 Encounter for therapeutic drug level monitoring: Secondary | ICD-10-CM | POA: Diagnosis not present

## 2023-01-14 DIAGNOSIS — Z944 Liver transplant status: Secondary | ICD-10-CM | POA: Diagnosis not present

## 2023-01-14 DIAGNOSIS — K754 Autoimmune hepatitis: Secondary | ICD-10-CM | POA: Diagnosis not present

## 2023-02-08 ENCOUNTER — Other Ambulatory Visit: Payer: Self-pay | Admitting: Internal Medicine

## 2023-02-08 DIAGNOSIS — I129 Hypertensive chronic kidney disease with stage 1 through stage 4 chronic kidney disease, or unspecified chronic kidney disease: Secondary | ICD-10-CM

## 2023-02-13 ENCOUNTER — Telehealth: Payer: Self-pay

## 2023-02-13 NOTE — Progress Notes (Addendum)
Novo Nordisk patient assistance program notification:  120- day supply of Evaristo Bury will be filled on 02/26/2023 and should arrive to the office in 10-14 business days. Patient enrollment will expire on 10/29/2023.   Billee Cashing, CMA Clinical Pharmacist Assistant [phone]

## 2023-02-14 ENCOUNTER — Other Ambulatory Visit: Payer: Self-pay

## 2023-02-14 DIAGNOSIS — I129 Hypertensive chronic kidney disease with stage 1 through stage 4 chronic kidney disease, or unspecified chronic kidney disease: Secondary | ICD-10-CM

## 2023-02-14 MED ORDER — DAPAGLIFLOZIN PROPANEDIOL 10 MG PO TABS: 10.0000 mg | ORAL_TABLET | Freq: Every day | ORAL | 5 refills | Status: DC

## 2023-02-14 MED ORDER — AMLODIPINE BESYLATE 5 MG PO TABS: 5.0000 mg | ORAL_TABLET | Freq: Every day | ORAL | 2 refills | Status: DC

## 2023-02-18 ENCOUNTER — Other Ambulatory Visit: Payer: Self-pay

## 2023-02-18 DIAGNOSIS — I129 Hypertensive chronic kidney disease with stage 1 through stage 4 chronic kidney disease, or unspecified chronic kidney disease: Secondary | ICD-10-CM

## 2023-02-18 MED ORDER — AMLODIPINE BESYLATE 5 MG PO TABS: ORAL_TABLET | ORAL | 2 refills | Status: DC

## 2023-02-18 NOTE — Telephone Encounter (Signed)
Pharmacy comment: Script Clarification:PT SAYS TAKE 2 A DAY. IF ACCURATE PLEASE SEND NEW RX.

## 2023-02-27 ENCOUNTER — Encounter: Payer: Self-pay | Admitting: Internal Medicine

## 2023-02-27 ENCOUNTER — Ambulatory Visit (INDEPENDENT_AMBULATORY_CARE_PROVIDER_SITE_OTHER): Payer: Medicare HMO | Admitting: Internal Medicine

## 2023-02-27 VITALS — BP 124/78 | HR 81 | Temp 98.4°F | Ht 67.0 in | Wt 156.8 lb

## 2023-02-27 DIAGNOSIS — N1831 Chronic kidney disease, stage 3a: Secondary | ICD-10-CM

## 2023-02-27 DIAGNOSIS — D849 Immunodeficiency, unspecified: Secondary | ICD-10-CM | POA: Diagnosis not present

## 2023-02-27 DIAGNOSIS — E1122 Type 2 diabetes mellitus with diabetic chronic kidney disease: Secondary | ICD-10-CM | POA: Diagnosis not present

## 2023-02-27 DIAGNOSIS — Z794 Long term (current) use of insulin: Secondary | ICD-10-CM

## 2023-02-27 DIAGNOSIS — D696 Thrombocytopenia, unspecified: Secondary | ICD-10-CM | POA: Diagnosis not present

## 2023-02-27 DIAGNOSIS — R634 Abnormal weight loss: Secondary | ICD-10-CM | POA: Diagnosis not present

## 2023-02-27 DIAGNOSIS — Z23 Encounter for immunization: Secondary | ICD-10-CM

## 2023-02-27 DIAGNOSIS — Z944 Liver transplant status: Secondary | ICD-10-CM | POA: Diagnosis not present

## 2023-02-27 DIAGNOSIS — I129 Hypertensive chronic kidney disease with stage 1 through stage 4 chronic kidney disease, or unspecified chronic kidney disease: Secondary | ICD-10-CM | POA: Diagnosis not present

## 2023-02-27 DIAGNOSIS — E213 Hyperparathyroidism, unspecified: Secondary | ICD-10-CM | POA: Diagnosis not present

## 2023-02-27 NOTE — Patient Instructions (Signed)

## 2023-02-27 NOTE — Progress Notes (Signed)
I,Kathryn Walsh,acting as a scribe for Kathryn Aliment, MD.,have documented all relevant documentation on the behalf of Kathryn Aliment, MD,as directed by  Kathryn Aliment, MD while in the presence of Kathryn Aliment, MD.    Subjective:     Patient ID: Kathryn Walsh , female    DOB: Oct 26, 1956 , 67 y.o.   MRN: [id number]   Chief Complaint  Patient presents with   Diabetes   Hypertension    HPI  She presents today for DM & HTN f/u. She denies having any headaches, chest pain and shortness of breath.  She was recently started on Farxiga, states her insurance doesn't cover meds. She needs more samples.   She denies having any other concerns at this time.     Diabetes She presents for her follow-up diabetic visit. She has type 2 diabetes mellitus. Her disease course has been worsening. There are no hypoglycemic associated symptoms. Pertinent negatives for hypoglycemia include no dizziness or headaches. Pertinent negatives for diabetes include no blurred vision, no chest pain, no fatigue, no polydipsia, no polyphagia and no polyuria. There are no hypoglycemic complications. Risk factors for coronary artery disease include diabetes mellitus, dyslipidemia, hypertension and sedentary lifestyle. Her weight is decreasing steadily. She is following a generally healthy diet. When asked about meal planning, she reported none. She has not had a previous visit with a dietitian. She participates in exercise intermittently. Her breakfast blood glucose is taken between 9-10 am. Her breakfast blood glucose range is generally 140-180 mg/dl. She does not see a podiatrist.Eye exam current: Has a appt coming up. .  Hypertension This is a chronic problem. The current episode started more than 1 year ago. The problem has been gradually improving since onset. The problem is controlled. Pertinent negatives include no blurred vision, chest pain, headaches, palpitations or shortness of breath. The current  treatment provides moderate improvement. Compliance problems include exercise.      Past Medical History:  Diagnosis Date   Diabetes mellitus without complication (HCC)    High cholesterol    HTN (hypertension)    Liver transplant recipient [facility])      Family History  Problem Relation Age of Onset   Healthy Mother    Healthy Father    Heart disease Sister    Heart disease Brother    Gout Brother    Heart disease Brother    Healthy Daughter    Diabetes Son      Current Outpatient Medications:    amLODipine (NORVASC) 5 MG tablet, Take one tablet by mouth twice daily., Disp: 90 tablet, Rfl: 2   azaTHIOprine (IMURAN) 50 MG tablet, Take 100 mg by mouth 2 (two) times daily. , Disp: , Rfl:    Blood Glucose Monitoring Suppl (ACCU-CHEK GUIDE ME) w/Device KIT, Use to check blood sugar 3 times a day. Dx code e11.65, Disp: 1 kit, Rfl: 3   Cholecalciferol 25 MCG (1000 UT) capsule, Take 4,000 Units by mouth daily., Disp: , Rfl:    cyclobenzaprine (FLEXERIL) 10 MG tablet, TAKE 1 TABLET BY MOUTH EVERYDAY AT BEDTIME, Disp: 30 tablet, Rfl: 0   dapagliflozin propanediol (FARXIGA) 10 MG TABS tablet, Take 1 tablet (10 mg total) by mouth daily before breakfast., Disp: 30 tablet, Rfl: 5   glucose blood (ACCU-CHEK GUIDE) test strip, Use as instructed to check blood sugar 3 times a day. Dx code e11.65, Disp: 100 each, Rfl: 12   insulin degludec (TRESIBA FLEXTOUCH) 200 UNIT/ML FlexTouch Pen, Inject 25 units nightly,  Disp: 9 mL, Rfl: 3   pravastatin (PRAVACHOL) 40 MG tablet, TAKE 1 TABLET BY MOUTH EVERYDAY AT BEDTIME, Disp: 90 tablet, Rfl: 1   tacrolimus (PROGRAF) 1 MG capsule, Take 2 mg in am and 3 mg in pm, Disp: , Rfl:    TURMERIC PO, Take by mouth., Disp: , Rfl:    insulin lispro (HUMALOG KWIKPEN) 200 UNIT/ML KwikPen, Inject 6 Units into the skin 2 (two) times daily before a meal. Please inject 6 units before breakfast and dinner; max dose titration 40 units daily (Patient not taking: Reported on  12/06/2022), Disp: 5 mL, Rfl: 1   No Known Allergies   Review of Systems  Constitutional: Negative.  Negative for fatigue.  Eyes:  Negative for blurred vision.  Respiratory: Negative.  Negative for shortness of breath.   Cardiovascular: Negative.  Negative for chest pain and palpitations.  Endocrine: Negative for polydipsia, polyphagia and polyuria.  Musculoskeletal: Negative.   Skin: Negative.   Neurological: Negative.  Negative for dizziness and headaches.  Psychiatric/Behavioral: Negative.       Today's Vitals   02/27/23 1431  BP: 124/78  Pulse: 81  Temp: 98.4 F (36.9 C)  Weight: 156 lb 12.8 oz (71.1 kg)  Height: 5\' 7"  (1.702 m)  PainSc: 0-No pain   Body mass index is 24.56 kg/m.  Wt Readings from Last 3 Encounters:  02/27/23 156 lb 12.8 oz (71.1 kg)  12/06/22 163 lb (73.9 kg)  09/27/22 165 lb (74.8 kg)    Objective:  Physical Exam Vitals and nursing note reviewed.  Constitutional:      Appearance: Normal appearance.  HENT:     Head: Normocephalic and atraumatic.  Eyes:     Extraocular Movements: Extraocular movements intact.  Cardiovascular:     Rate and Rhythm: Normal rate and regular rhythm.     Heart sounds: Normal heart sounds.  Pulmonary:     Effort: Pulmonary effort is normal.     Breath sounds: Normal breath sounds.  Musculoskeletal:     Cervical back: Normal range of motion.  Skin:    General: Skin is warm.  Neurological:     General: No focal deficit present.     Mental Status: She is alert.  Psychiatric:        Mood and Affect: Mood normal.        Behavior: Behavior normal.         Assessment And Plan:     1. Type 2 diabetes mellitus with stage 3a chronic kidney disease, with long-term current use of insulin (HCC) Comments: Chronic, reminded to avoid NSAiDs. She will c/w [place], importance of med compliance was d/w patient. - Hemoglobin A1c - AMB Referral to [facility] Coordin[facility] ([facility] Patients) - BMP8+EGFR  2. Parenchymal renal  hypertension, stage 1 through stage 4 or unspecified chronic kidney disease Comments: Chronic, well controlled.  She will c/w amlodipine 5mg  daily. Reminded to follow low sodium diet. She agrees to [facility] referral. - AMB Referral to [facility]ordin[facility] ([facility] Patients)  3. Stage 3a chronic kidney disease (HCC) Comments: Chronic, reminded to avoid NSAIDs, stay well hydrated and keep BP/Bs controlled to decrease risk of CKD progression. - AMB Referral to [facility]ordin[facility] ([facility]O Patients)  4. Thrombocytopenia (HCC) Comments: Previous labs reviewed, her platelets have been low in the past. I will recheck a CBC today. - CBC  5. Unintentional weight loss Comments: She has lost 7lbs since Feb 2024. She feels she has a good appetite. - Prealbumin  6. 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 - Parathyroid Hormone, Intact w/Ca  7. Immunosuppressed status (HCC) Comments: She is s/p liver transplant. She is currently taking azathioprine and Prograf daily.  8. Immunization due Comments: She was given Tdap, will be billed via [facility]. - Tdap vaccine greater than or equal to 7yo IM  9. S/P liver transplant [facility]   Patient was given opportunity to ask questions. Patient verbalized understanding of the plan and was able to repeat key elements of the plan. All questions were answered to their satisfaction.   I, Kathryn Aliment, MD, have reviewed all documentation for this visit. The documentation on 02/27/23 for the exam, diagnosis, procedures, and orders are all accurate and complete.   IF YOU HAVE BEEN REFERRED TO A SPECIALIST, IT MAY TAKE 1-2 WEEKS TO SCHEDULE/PROCESS THE REFERRAL. IF YOU HAVE NOT HEARD FROM US/SPECIALIST IN TWO WEEKS, PLEASE GIVE [place] A CALL AT [phone].   THE PATIENT IS ENCOURAGED TO PRACTICE SOCIAL DISTANCING DUE TO THE COVID-19 PANDEMIC.

## 2023-02-28 LAB — BMP8+EGFR
BUN/Creatinine Ratio: 19 (ref 12–28)
BUN: 20 mg/dL (ref 8–27)
CO2: 20 mmol/L (ref 20–29)
Calcium: 8.8 mg/dL (ref 8.7–10.3)
Chloride: 106 mmol/L (ref 96–106)
Creatinine, Ser: 1.05 mg/dL — ABNORMAL HIGH (ref 0.57–1.00)
Glucose: 92 mg/dL (ref 70–99)
Potassium: 4 mmol/L (ref 3.5–5.2)
Sodium: 141 mmol/L (ref 134–144)
eGFR: 58 mL/min/{1.73_m2} — ABNORMAL LOW (ref >59)

## 2023-02-28 LAB — PTH, INTACT AND CALCIUM: PTH: 82 pg/mL — ABNORMAL HIGH (ref 15–65)

## 2023-02-28 LAB — PREALBUMIN: PREALBUMIN: 25 mg/dL (ref 10–36)

## 2023-02-28 LAB — CBC
Hematocrit: 28.8 % — ABNORMAL LOW (ref 34.0–46.6)
Hemoglobin: 9.7 g/dL — ABNORMAL LOW (ref 11.1–15.9)
MCH: 31.5 pg (ref 26.6–33.0)
MCHC: 33.7 g/dL (ref 31.5–35.7)
MCV: 94 fL (ref 79–97)
Platelets: 130 10*3/uL — ABNORMAL LOW (ref 150–450)
RBC: 3.08 x10E6/uL — ABNORMAL LOW (ref 3.77–5.28)
RDW: 15.1 % (ref 11.7–15.4)
WBC: 4 10*3/uL (ref 3.4–10.8)

## 2023-02-28 LAB — HEMOGLOBIN A1C
Est. average glucose Bld gHb Est-mCnc: 126 mg/dL
Hgb A1c MFr Bld: 6 % — ABNORMAL HIGH (ref 4.8–5.6)

## 2023-03-01 ENCOUNTER — Telehealth: Payer: Self-pay

## 2023-03-01 NOTE — Progress Notes (Signed)
03-01-2023: Unable to leave a VM to schedule with Cherylin Mylar. 03-05-2023: Left VM to schedule with Cherylin Mylar. 03-07-2023: Patient was scheduled.  Huey Romans [facility] Clinical Pharmacist Assistant [phone]

## 2023-03-05 ENCOUNTER — Telehealth: Payer: Self-pay

## 2023-03-05 NOTE — Telephone Encounter (Signed)
Informed patient that her Kathryn Walsh came in. I reassured her that the name of the medication is different but it is the same medication. Also requested that she drop off her financial information for this year to complete her [place] patient assistance.  Cherylin Mylar, CPP, PharmD Clinical Pharmacist Practitioner [facility] [phone]

## 2023-03-12 ENCOUNTER — Telehealth: Payer: Self-pay

## 2023-03-12 NOTE — Progress Notes (Signed)
Faxing application to [place] patient assistance for [place] after receiving provider signature.    Billee Cashing, CMA Clinical Pharmacist Assistant [phone]

## 2023-03-12 NOTE — Progress Notes (Signed)
As of 02/27/2023 patient picked up 4 boxes of Novofine needles and as of 03/07/2023 patient picked up 3 boxes of Tresiba from PCP office. Novo Nordisk patient assistance.    Billee Cashing, CMA Clinical Pharmacist Assistant [phone]

## 2023-03-13 ENCOUNTER — Telehealth: Payer: Self-pay

## 2023-03-13 NOTE — Progress Notes (Signed)
Care Management & Coordination Services Pharmacy Team  Reason for Encounter: Appointment Reminder  Contacted patient to confirm telephone appointment with Cherylin Mylar, PharmD on 03-15-2023 at 11:00. Spoke with patient on 03/13/2023   Do you have any problems getting your medications? No Farxiga patient assistance in process.  What is your top health concern you would like to discuss at your upcoming visit? Patient stated no concerns  Have you seen any other providers since your last visit with PCP? No   Chart review: Recent office visits:  02-27-2023 Dorothyann Peng, MD. Follow up visit for diabetes and HTN. Tdap injection given.   12-06-2022 Dorothyann Peng, MD. Follow up visit for diabetes and HTN. Shingrix given. Follow up in 3 months.  09-27-2022 Barb Merino, LPN. Medicare wellness visit.  Recent consult visits:  01-14-2023 Barrett, Gaylyn Rong, PA ([facility]). Follow up visit for management of immunosuppression following liver transplantation. Follow up in 1 year.  Hospital visits:  None in previous 6 months   Star Rating Drugs:  Pravastatin 40 mg- Last filled 02-13-2023 90 [facility]. Previous 11-25-2022 90 DS Farxiga 10 mg- Samples   Care Gaps: Annual wellness visit in last year? Yes Covid booster overdue  If Diabetic: Last eye exam / retinopathy screening: 04-27-2022 Last diabetic foot exam: None   Huey Romans [facility] Clinical Pharmacist Assistant [phone]

## 2023-03-15 NOTE — Progress Notes (Unsigned)
Care Management & Coordination Services Pharmacy Note  03/15/2023 Name:  Kathryn Walsh MRN:  [id number] DOB:  [birth year]  Summary: ***  Recommendations/Changes made from today's visit: ***  Follow up plan: ***   Subjective: Kathryn Walsh is an 67 y.o. year old female who is a primary patient of Dorothyann Peng, MD.  The care coordination team was consulted for assistance with disease management and care coordination needs.    Engaged with patient by telephone for follow up visit.  Recent office visits: ***  Recent consult visits: ***  Hospital visits: {Hospital DC Yes/No:25215}   Objective:  Lab Results  Component Value Date   CREATININE 1.05 (H) 02/27/2023   BUN 20 02/27/2023   EGFR 58 (L) 02/27/2023   GFRNONAA 95 10/12/2020   GFRAA 109 10/12/2020   NA 141 02/27/2023   K 4.0 02/27/2023   CALCIUM 8.8 02/27/2023   CO2 20 02/27/2023   GLUCOSE 92 02/27/2023    Lab Results  Component Value Date/Time   HGBA1C 6.0 (H) 02/27/2023 03:27 PM   HGBA1C 5.5 12/06/2022 10:27 AM   MICROALBUR 150 04/20/2021 04:05 PM   MICROALBUR 150 04/12/2020 09:55 AM    Last diabetic Eye exam:  Lab Results  Component Value Date/Time   HMDIABEYEEXA No Retinopathy 04/27/2022 12:00 AM    Last diabetic Foot exam: No results found for: "HMDIABFOOTEX"   Lab Results  Component Value Date   CHOL 135 07/04/2022   HDL 44 07/04/2022   LDLCALC 67 07/04/2022   TRIG 140 07/04/2022   CHOLHDL 3.1 07/04/2022       Latest Ref Rng & Units 12/06/2022   10:27 AM 07/04/2022    3:23 PM 02/28/2022    3:47 PM  Hepatic Function  Total Protein 6.0 - 8.5 g/dL 6.5  7.3  7.1   Albumin 3.9 - 4.9 g/dL 3.7  4.0  3.9   AST 0 - 40 IU/L 18  17  17    ALT 0 - 32 IU/L 8  9  8    Alk Phosphatase 44 - 121 IU/L 175  151  156   Total Bilirubin 0.0 - 1.2 mg/dL 0.5  0.4  0.4     Lab Results  Component Value Date/Time   TSH 1.18 09/07/2021 09:40 AM       Latest Ref Rng & Units 02/27/2023    3:27 PM  12/06/2022   10:27 AM 07/04/2022    3:23 PM  CBC  WBC 3.4 - 10.8 x10E3/uL 4.0  3.9  4.2   Hemoglobin 11.1 - 15.9 g/dL 9.7  40.9  81.1   Hematocrit 34.0 - 46.6 % 28.8  30.3  31.7   Platelets 150 - 450 x10E3/uL 130  112  107     Lab Results  Component Value Date/Time   VD25OH 33 09/07/2021 09:40 AM   VITAMINB12 509 02/28/2022 03:47 PM    Clinical ASCVD: {YES/NO:21197} The 10-year ASCVD risk score (Arnett DK, et al., 2019) is: 28.8%   Values used to calculate the score:     Age: 40 years     Sex: Female     Is Non-Hispanic African American: Yes     Diabetic: Yes     Tobacco smoker: Yes     Systolic Blood Pressure: 124 mmHg     Is BP treated: Yes     HDL Cholesterol: 44 mg/dL     Total Cholesterol: 135 mg/dL    ***Other: ([id number] if Afib, MMRC or CAT for COPD, ACT, DEXA)  02/27/2023    2:31 PM 12/06/2022    9:48 AM 09/27/2022    2:47 PM  Depression screen PHQ 2/9  Decreased Interest 0 0 0  Down, Depressed, Hopeless 0 0 0  PHQ - 2 Score 0 0 0  Altered sleeping 0    Tired, decreased energy 0    Change in appetite 0    Feeling bad or failure about yourself  0    Trouble concentrating 0    Moving slowly or fidgety/restless 0    Suicidal thoughts 0    PHQ-9 Score 0    Difficult doing work/chores Not difficult at all       Social History   Tobacco Use  Smoking Status Former   Packs/day: 0.75   Years: 40.00   Additional pack years: 0.00   Total pack years: 30.00   Types: Cigarettes   Start date: 11/12/1974  Smokeless Tobacco Never  Tobacco Comments   encouraged to avoid smoking in the house and in her car   BP Readings from Last 3 Encounters:  02/27/23 124/78  12/06/22 (!) 140/76  09/10/22 (!) 150/70   Pulse Readings from Last 3 Encounters:  02/27/23 81  12/06/22 78  09/10/22 98   Wt Readings from Last 3 Encounters:  02/27/23 156 lb 12.8 oz (71.1 kg)  12/06/22 163 lb (73.9 kg)  09/27/22 165 lb (74.8 kg)   BMI Readings from Last 3 Encounters:   02/27/23 24.56 kg/m  12/06/22 25.53 kg/m  09/27/22 25.84 kg/m    No Known Allergies  Medications Reviewed Today     Reviewed by Dorothyann Peng, MD (Physician) on 02/27/23 at 1506  Med List Status: <None>   Medication Order Taking? Sig Documenting Provider Last Dose Status Informant  amLODipine (NORVASC) 5 MG tablet [id number] Yes Take one tablet by mouth twice daily. Dorothyann Peng, MD Taking Active   azaTHIOprine [facility]) 50 MG tablet [id number] Yes Take 100 mg by mouth 2 (two) times daily.  [provider] Taking Active Self  Blood Glucose Monitoring Suppl (ACCU-CHEK GUIDE ME) w/Device KIT [id number] Yes Use to check blood sugar 3 times a day. Dx code e11.65 Dorothyann Peng, MD Taking Active   Cholecalciferol 25 MCG (1000 UT) capsule [id number] Yes Take 4,000 Units by mouth daily. [provider] Taking Active   cyclobenzaprine (FLEXERIL) 10 MG tablet [id number] Yes TAKE 1 TABLET BY MOUTH EVERYDAY AT BEDTIME Ghumman, Ramandeep, NP Taking Active   dapagliflozin propanediol (FARXIGA) 10 MG TABS tablet [id number] Yes Take 1 tablet (10 mg total) by mouth daily before breakfast. Dorothyann Peng, MD Taking Active   glucose blood (ACCU-CHEK GUIDE) test strip [id number] Yes Use as instructed to check blood sugar 3 times a day. Dx code e11.65 Dorothyann Peng, MD Taking Active   insulin degludec (TRESIBA FLEXTOUCH) 200 UNIT/ML FlexTouch Pen [id number] Yes Inject 25 units nightly Dorothyann Peng, MD Taking Active   insulin lispro (HUMALOG KWIKPEN) 200 UNIT/ML KwikPen [id number] No Inject 6 Units into the skin 2 (two) times daily before a meal. Please inject 6 units before breakfast and dinner; max dose titration 40 units daily  Patient not taking: Reported on 12/06/2022   Dorothyann Peng, MD Not Taking Active   pravastatin (PRAVACHOL) 40 MG tablet [id number] Yes TAKE 1 TABLET BY MOUTH EVERYDAY AT BEDTIME Dorothyann Peng, MD Taking Active   tacrolimus (PROGRAF) 1 MG capsule [id number] Yes Take 2 mg  in am and 3 mg in pm [provider] Taking Active Self  TURMERIC PO [id number] Yes  Take by mouth. [provider] Taking Active Self            SDOH:  (Social Determinants of Health) assessments and interventions performed: {yes/no:20286} SDOH Interventions    Flowsheet Row Clinical Support from 09/27/2022 in [facility] Chronic Care Management from 07/15/2020 in [facility] Clinical Support from 07/02/2019 in [facility]  SDOH Interventions     Food Insecurity Interventions Intervention Not Indicated -- --  Transportation Interventions Intervention Not Indicated -- --  Depression Interventions/Treatment  -- -- ZOX0-9 Score <4 Follow-up Not Indicated  Financial Walsh Interventions Intervention Not Indicated Other (Comment)  [Assist with reordering Tresiba from patient assistance program] --  Physical Activity Interventions Intervention Not Indicated -- --  Stress Interventions Intervention Not Indicated -- --       Medication Assistance: {MEDASSISTANCEINFO:25044}  Medication Access: Name and location of current pharmacy:  [id number] [place], [place] - [address] AT [facility] [address] [place] [address] Phone: [phone] Fax: [phone]  Within the past 30 days, how often has patient missed a dose of medication? *** Is a pillbox or other method used to improve adherence? {YES/NO:21197} Factors that may affect medication adherence? {CHL DESC; BARRIERS:21522} Are meds synced by current pharmacy? {YES/NO:21197} Are meds delivered by current pharmacy? {YES/NO:21197} Does patient experience delays in picking up medications due to transportation concerns? {YES/NO:21197}  Compliance/Adherence/Medication fill history: Care Gaps: ***  Star-Rating Drugs: ***   Assessment/Plan   {CCM PHARMD DISEASE  STATES:25130}  ***

## 2023-04-01 ENCOUNTER — Telehealth: Payer: Self-pay

## 2023-04-01 NOTE — Telephone Encounter (Signed)
PER Kathryn Walsh KIDNEY SEVERAL ATTEMPTS HAVE BEEN MADE TO SCHEDULE PT FOR NEW PATIENT CONSULT, NO RESPONSE FROM PT.

## 2023-04-10 ENCOUNTER — Telehealth: Payer: Self-pay

## 2023-04-10 NOTE — Progress Notes (Cosign Needed)
[employer] patient assistance program notification:  120- day supply of Tresiba and Novofine needles  should arrive to the office in 10-14 business days. Patient has 1  refill remaining and enrollment will expire on 10/29/2023. Next refill will be fulfilled on 06/21/2023.  Reorder form completed and faxed by C[facility].    Billee Cashing, CMA Clinical Pharmacist Assistant [phone]

## 2023-05-05 ENCOUNTER — Other Ambulatory Visit: Payer: Self-pay | Admitting: Internal Medicine

## 2023-05-05 DIAGNOSIS — I129 Hypertensive chronic kidney disease with stage 1 through stage 4 chronic kidney disease, or unspecified chronic kidney disease: Secondary | ICD-10-CM

## 2023-05-14 DIAGNOSIS — Z944 Liver transplant status: Secondary | ICD-10-CM | POA: Diagnosis not present

## 2023-05-14 DIAGNOSIS — D849 Immunodeficiency, unspecified: Secondary | ICD-10-CM | POA: Diagnosis not present

## 2023-05-22 ENCOUNTER — Other Ambulatory Visit: Payer: Self-pay | Admitting: Internal Medicine

## 2023-05-22 DIAGNOSIS — Z1231 Encounter for screening mammogram for malignant neoplasm of breast: Secondary | ICD-10-CM

## 2023-05-30 ENCOUNTER — Ambulatory Visit
Admission: RE | Admit: 2023-05-30 | Discharge: 2023-05-30 | Disposition: A | Discharge status: Home / Self Care | Payer: Medicare HMO | Source: Ambulatory Visit | Attending: Internal Medicine | Admitting: Internal Medicine

## 2023-05-30 DIAGNOSIS — Z1231 Encounter for screening mammogram for malignant neoplasm of breast: Secondary | ICD-10-CM | POA: Diagnosis not present

## 2023-06-14 ENCOUNTER — Telehealth: Payer: Self-pay

## 2023-06-14 NOTE — Telephone Encounter (Signed)
PAP: Application for Kathryn Walsh has been submitted to PAP Companies: [employer], via fax   PLEASE BE ADVISED

## 2023-06-21 ENCOUNTER — Other Ambulatory Visit: Payer: Self-pay | Admitting: Internal Medicine

## 2023-06-28 DIAGNOSIS — E119 Type 2 diabetes mellitus without complications: Secondary | ICD-10-CM | POA: Diagnosis not present

## 2023-06-28 LAB — HM DIABETES EYE EXAM

## 2023-07-05 NOTE — Patient Instructions (Signed)

## 2023-07-08 ENCOUNTER — Encounter: Payer: Self-pay | Admitting: Internal Medicine

## 2023-07-08 ENCOUNTER — Ambulatory Visit (INDEPENDENT_AMBULATORY_CARE_PROVIDER_SITE_OTHER): Payer: Medicare HMO | Admitting: Internal Medicine

## 2023-07-08 VITALS — BP 126/78 | HR 91 | Temp 98.4°F | Ht 67.0 in | Wt 157.4 lb

## 2023-07-08 DIAGNOSIS — Z794 Long term (current) use of insulin: Secondary | ICD-10-CM

## 2023-07-08 DIAGNOSIS — D849 Immunodeficiency, unspecified: Secondary | ICD-10-CM

## 2023-07-08 DIAGNOSIS — R809 Proteinuria, unspecified: Secondary | ICD-10-CM

## 2023-07-08 DIAGNOSIS — Z0000 Encounter for general adult medical examination without abnormal findings: Secondary | ICD-10-CM

## 2023-07-08 DIAGNOSIS — Z7984 Long term (current) use of oral hypoglycemic drugs: Secondary | ICD-10-CM

## 2023-07-08 DIAGNOSIS — Z944 Liver transplant status: Secondary | ICD-10-CM

## 2023-07-08 DIAGNOSIS — I129 Hypertensive chronic kidney disease with stage 1 through stage 4 chronic kidney disease, or unspecified chronic kidney disease: Secondary | ICD-10-CM | POA: Diagnosis not present

## 2023-07-08 DIAGNOSIS — E2839 Other primary ovarian failure: Secondary | ICD-10-CM | POA: Diagnosis not present

## 2023-07-08 DIAGNOSIS — N1831 Chronic kidney disease, stage 3a: Secondary | ICD-10-CM | POA: Diagnosis not present

## 2023-07-08 DIAGNOSIS — E1122 Type 2 diabetes mellitus with diabetic chronic kidney disease: Secondary | ICD-10-CM | POA: Diagnosis not present

## 2023-07-08 DIAGNOSIS — Z23 Encounter for immunization: Secondary | ICD-10-CM | POA: Diagnosis not present

## 2023-07-08 LAB — POCT URINALYSIS DIPSTICK
Bilirubin, UA: NEGATIVE
Glucose, UA: POSITIVE — AB
Ketones, UA: NEGATIVE
Leukocytes, UA: NEGATIVE
Nitrite, UA: NEGATIVE
Protein, UA: POSITIVE — AB
Spec Grav, UA: >=1.03 — AB (ref 1.010–1.025)
Urobilinogen, UA: 0.2 U/dL
pH, UA: 5.5 (ref 5.0–8.0)

## 2023-07-08 NOTE — Progress Notes (Unsigned)
I,Victoria T Deloria Lair, CMA,acting as a Neurosurgeon for Gwynneth Aliment, MD.,have documented all relevant documentation on the behalf of Gwynneth Aliment, MD,as directed by  Gwynneth Aliment, MD while in the presence of Gwynneth Aliment, MD.  Subjective:  Patient ID: Kathryn Walsh , female    DOB: 04/30/1956 , 67 y.o.   MRN: [id number]  Chief Complaint  Patient presents with   Annual Exam   Diabetes   Hypertension    HPI  She is here today for a full physical exam. She is no longer followed by GYN.  She has no specific concerns or complaints at this time. She reports compliance with meds. She denies headaches, chest pain and shortness of breath. She is in the process of selling her home. She is planning to move to [place] to live with her daughter.   Letter sent to ophthalmologist for DM eye exam.   Diabetes She presents for her follow-up diabetic visit. She has type 2 diabetes mellitus. Her disease course has been worsening. There are no hypoglycemic associated symptoms. Pertinent negatives for diabetes include no blurred vision. There are no hypoglycemic complications. Risk factors for coronary artery disease include diabetes mellitus, dyslipidemia, hypertension and sedentary lifestyle. Her weight is decreasing steadily. She is following a diabetic diet. She participates in exercise intermittently. Her breakfast blood glucose is taken between 9-10 am. Her breakfast blood glucose range is generally 140-180 mg/dl.  Hypertension This is a chronic problem. The current episode started more than 1 year ago. The problem has been gradually improving since onset. The problem is controlled. Pertinent negatives include no blurred vision. Risk factors for coronary artery disease include diabetes mellitus, dyslipidemia and post-menopausal state.     Past Medical History:  Diagnosis Date   Diabetes mellitus without complication (HCC)    High cholesterol    HTN (hypertension)    Liver transplant  recipient [facility])      Family History  Problem Relation Age of Onset   Healthy Mother    Healthy Father    Heart disease Sister    Heart disease Brother    Gout Brother    Heart disease Brother    Healthy Daughter    Diabetes Son      Current Outpatient Medications:    amLODipine (NORVASC) 5 MG tablet, TAKE 1 TABLET BY MOUTH TWICE A DAY, Disp: 180 tablet, Rfl: 1   azaTHIOprine (IMURAN) 50 MG tablet, Take 100 mg by mouth 2 (two) times daily. , Disp: , Rfl:    Blood Glucose Monitoring Suppl (ACCU-CHEK GUIDE ME) w/Device KIT, Use to check blood sugar 3 times a day. Dx code e11.65, Disp: 1 kit, Rfl: 3   Cholecalciferol 25 MCG (1000 UT) capsule, Take 4,000 Units by mouth daily., Disp: , Rfl:    cyclobenzaprine (FLEXERIL) 10 MG tablet, TAKE 1 TABLET BY MOUTH EVERYDAY AT BEDTIME, Disp: 30 tablet, Rfl: 0   dapagliflozin propanediol (FARXIGA) 10 MG TABS tablet, Take 1 tablet (10 mg total) by mouth daily before breakfast., Disp: 30 tablet, Rfl: 5   glucose blood (ACCU-CHEK GUIDE) test strip, Use as instructed to check blood sugar 3 times a day. Dx code e11.65, Disp: 100 each, Rfl: 12   insulin degludec (TRESIBA FLEXTOUCH) 200 UNIT/ML FlexTouch Pen, Inject 25 units nightly, Disp: 9 mL, Rfl: 3   pravastatin (PRAVACHOL) 40 MG tablet, TAKE 1 TABLET BY MOUTH EVERYDAY AT BEDTIME, Disp: 90 tablet, Rfl: 1   tacrolimus (PROGRAF) 1 MG capsule, Take 2 mg in am  and 3 mg in pm, Disp: , Rfl:    TURMERIC PO, Take by mouth., Disp: , Rfl:    insulin lispro (HUMALOG KWIKPEN) 200 UNIT/ML KwikPen, Inject 6 Units into the skin 2 (two) times daily before a meal. Please inject 6 units before breakfast and dinner; max dose titration 40 units daily (Patient not taking: Reported on 12/06/2022), Disp: 5 mL, Rfl: 1   No Known Allergies   Review of Systems  Constitutional: Negative.   HENT: Negative.    Eyes: Negative.  Negative for blurred vision.  Respiratory: Negative.    Cardiovascular: Negative.   Gastrointestinal:  Negative.   Endocrine: Negative.   Genitourinary: Negative.   Musculoskeletal: Negative.   Allergic/Immunologic: Negative.   Neurological: Negative.   Hematological: Negative.   Psychiatric/Behavioral: Negative.       Today's Vitals   07/08/23 1406  BP: 126/78  Pulse: 91  Temp: 98.4 F (36.9 C)  SpO2: 98%  Weight: 157 lb 6.4 oz (71.4 kg)  Height: 5\' 7"  (1.702 m)   Body mass index is 24.65 kg/m.  Wt Readings from Last 3 Encounters:  07/08/23 157 lb 6.4 oz (71.4 kg)  02/27/23 156 lb 12.8 oz (71.1 kg)  12/06/22 163 lb (73.9 kg)     Objective:  Physical Exam Vitals and nursing note reviewed.  Constitutional:      Appearance: Normal appearance.  HENT:     Head: Normocephalic and atraumatic.     Right Ear: Tympanic membrane, ear canal and external ear normal.     Left Ear: Tympanic membrane, ear canal and external ear normal.     Nose: Nose normal.     Mouth/Throat:     Mouth: Mucous membranes are moist.     Pharynx: Oropharynx is clear.  Eyes:     Extraocular Movements: Extraocular movements intact.     Conjunctiva/sclera: Conjunctivae normal.     Pupils: Pupils are equal, round, and reactive to light.  Cardiovascular:     Rate and Rhythm: Normal rate and regular rhythm.     Pulses: Normal pulses.          Dorsalis pedis pulses are 2+ on the right side and 2+ on the left side.     Heart sounds: Normal heart sounds.  Pulmonary:     Effort: Pulmonary effort is normal.     Breath sounds: Normal breath sounds.  Abdominal:     General: Abdomen is flat. Bowel sounds are normal.     Palpations: Abdomen is soft.     Comments: Healed surgical scar  Genitourinary:    Comments: deferred Musculoskeletal:        General: Normal range of motion.     Cervical back: Normal range of motion and neck supple.  Feet:     Right foot:     Protective Sensation: 5 sites tested.  5 sites sensed.     Skin integrity: Dry skin present.     Toenail Condition: Right toenails are normal.      Left foot:     Protective Sensation: 5 sites tested.  5 sites sensed.     Skin integrity: Dry skin present.     Toenail Condition: Left toenails are normal.  Skin:    General: Skin is warm and dry.  Neurological:     General: No focal deficit present.     Mental Status: She is alert and oriented to person, place, and time.  Psychiatric:        Mood and Affect:  Mood normal.        Behavior: Behavior normal.         Assessment And Plan:  Annual physical exam Assessment & Plan: A full exam was performed.  Importance of monthly self breast exams was discussed with the patient.  She is advised to get 30-45 minutes of regular exercise, no less than four to five days per week. Both weight-bearing and aerobic exercises are recommended.  She is advised to follow a healthy diet with at least six fruits/veggies per day, decrease intake of red meat and other saturated fats and to increase fish intake to twice weekly.  Meats/fish should not be fried -- baked, boiled or broiled is preferable. It is also important to cut back on your sugar intake.  Be sure to read labels - try to avoid anything with added sugar, high fructose corn syrup or other sweeteners.  If you must use a sweetener, you can try stevia or monkfruit.  It is also important to avoid artificially sweetened foods/beverages and diet drinks. Lastly, wear SPF 50 sunscreen on exposed skin and when in direct sunlight for an extended period of time.  Be sure to avoid fast food restaurants and aim for at least 60 ounces of water daily.       Type 2 diabetes mellitus with stage 3a chronic kidney disease, with long-term current use of insulin (HCC) Assessment & Plan: Chronic, diabetic foot exam was performed. CKD is chronic, she is encouraged to stay well hydrated, avoid NSAIDs and keep BP controlled to prevent progression of CKD.  She now agrees to Nephrology referral. She will continue with [place] 10mg  daily, Tresiba 25 units sq weekly. She  also uses Humalog 6 units twice daily before meals.  She has yet to schedule appt with Nephrology. I will place second referral. I DISCUSSED WITH THE PATIENT AT LENGTH REGARDING THE GOALS OF GLYCEMIC CONTROL AND POSSIBLE LONG-TERM COMPLICATIONS.  I  ALSO STRESSED THE IMPORTANCE OF COMPLIANCE WITH HOME GLUCOSE MONITORING, DIETARY RESTRICTIONS INCLUDING AVOIDANCE OF SUGARY DRINKS/PROCESSED FOODS,  ALONG WITH REGULAR EXERCISE.  I  ALSO STRESSED THE IMPORTANCE OF ANNUAL EYE EXAMS, SELF FOOT CARE AND COMPLIANCE WITH OFFICE VISITS.   Orders: -     POCT urinalysis dipstick -     Microalbumin / creatinine urine ratio -     EKG 12-Lead -     CBC -     CMP14+EGFR -     Lipid panel -     Hemoglobin A1c -     Ambulatory referral to Nephrology -     TSH  Parenchymal renal hypertension, stage 1 through stage 4 or unspecified chronic kidney disease Assessment & Plan: Chronic, fair control. Goal BP<120/80.  EKG performed, NSR w/o acute changes. She will continue with amlodipine 5mg  daily. She is encouraged to follow low sodium diet. She will f/u in four months for re-evaluation.   Orders: -     POCT urinalysis dipstick -     Microalbumin / creatinine urine ratio -     EKG 12-Lead -     Ambulatory referral to Nephrology -     TSH  Estrogen deficiency -     DG Bone Density; Future  Positive for microalbuminuria Assessment & Plan: Chronic, she is now on Farxiga. I will check repeat microalbumin to document improvement. Importance of medication/dietary compliance was discussed with the patient.    Immunization due -     Flu Vaccine Trivalent High Dose (Fluad)  Immunosuppressed status (HCC)  Assessment & Plan: Chronic, s/p liver transplant.    S/P liver transplant (HCC)     Return for 1 year HM, 4 month dm.  Patient was given opportunity to ask questions. Patient verbalized understanding of the plan and was able to repeat key elements of the plan. All questions were answered to their  satisfaction.    I, Gwynneth Aliment, MD, have reviewed all documentation for this visit. The documentation on 07/08/23 for the exam, diagnosis, procedures, and orders are all accurate and complete.   IF YOU HAVE BEEN REFERRED TO A SPECIALIST, IT MAY TAKE 1-2 WEEKS TO SCHEDULE/PROCESS THE REFERRAL. IF YOU HAVE NOT HEARD FROM US/SPECIALIST IN TWO WEEKS, PLEASE GIVE [place] A CALL AT [phone].   THE PATIENT IS ENCOURAGED TO PRACTICE SOCIAL DISTANCING DUE TO THE COVID-19 PANDEMIC.

## 2023-07-09 ENCOUNTER — Telehealth: Payer: Self-pay

## 2023-07-09 LAB — LIPID PANEL
Chol/HDL Ratio: 2.9 ratio (ref 0.0–4.4)
Cholesterol, Total: 146 mg/dL (ref 100–199)
HDL: 51 mg/dL (ref >39)
LDL Chol Calc (NIH): 75 mg/dL (ref 0–99)
Triglycerides: 108 mg/dL (ref 0–149)
VLDL Cholesterol Cal: 20 mg/dL (ref 5–40)

## 2023-07-09 LAB — CMP14+EGFR
ALT: 10 IU/L (ref 0–32)
AST: 19 IU/L (ref 0–40)
Albumin: 3.9 g/dL (ref 3.9–4.9)
Alkaline Phosphatase: 185 IU/L — ABNORMAL HIGH (ref 44–121)
BUN/Creatinine Ratio: 17 (ref 12–28)
BUN: 24 mg/dL (ref 8–27)
Bilirubin Total: 0.4 mg/dL (ref 0.0–1.2)
CO2: 21 mmol/L (ref 20–29)
Calcium: 9.1 mg/dL (ref 8.7–10.3)
Chloride: 107 mmol/L — ABNORMAL HIGH (ref 96–106)
Creatinine, Ser: 1.38 mg/dL — ABNORMAL HIGH (ref 0.57–1.00)
Globulin, Total: 3.3 g/dL (ref 1.5–4.5)
Glucose: 72 mg/dL (ref 70–99)
Potassium: 4.7 mmol/L (ref 3.5–5.2)
Sodium: 143 mmol/L (ref 134–144)
Total Protein: 7.2 g/dL (ref 6.0–8.5)
eGFR: 42 mL/min/{1.73_m2} — ABNORMAL LOW (ref >59)

## 2023-07-09 LAB — TSH: TSH: 1.61 u[IU]/mL (ref 0.450–4.500)

## 2023-07-09 LAB — CBC
Hematocrit: 31.9 % — ABNORMAL LOW (ref 34.0–46.6)
Hemoglobin: 11.1 g/dL (ref 11.1–15.9)
MCH: 33.8 pg — ABNORMAL HIGH (ref 26.6–33.0)
MCHC: 34.8 g/dL (ref 31.5–35.7)
MCV: 97 fL (ref 79–97)
Platelets: 119 10*3/uL — ABNORMAL LOW (ref 150–450)
RBC: 3.28 x10E6/uL — ABNORMAL LOW (ref 3.77–5.28)
RDW: 14.5 % (ref 11.7–15.4)
WBC: 4.9 10*3/uL (ref 3.4–10.8)

## 2023-07-09 LAB — HEMOGLOBIN A1C
Est. average glucose Bld gHb Est-mCnc: 123 mg/dL
Hgb A1c MFr Bld: 5.9 % — ABNORMAL HIGH (ref 4.8–5.6)

## 2023-07-09 LAB — MICROALBUMIN / CREATININE URINE RATIO
Creatinine, Urine: 69.9 mg/dL
Microalb/Creat Ratio: 1761 mg/g{creat} — ABNORMAL HIGH (ref 0–29)
Microalbumin, Urine: 1230.8 ug/mL

## 2023-07-09 NOTE — Telephone Encounter (Signed)
Patient received samples at visit yesterday on 07/08/23.

## 2023-07-11 DIAGNOSIS — R809 Proteinuria, unspecified: Secondary | ICD-10-CM | POA: Insufficient documentation

## 2023-07-11 NOTE — Assessment & Plan Note (Addendum)
Chronic, fair control. Goal BP<120/80.  EKG performed, NSR w/o acute changes. She will continue with amlodipine 5mg  daily. She is encouraged to follow low sodium diet. She will f/u in four months for re-evaluation.

## 2023-07-11 NOTE — Assessment & Plan Note (Addendum)
Chronic, diabetic foot exam was performed. CKD is chronic, she is encouraged to stay well hydrated, avoid NSAIDs and keep BP controlled to prevent progression of CKD.  She now agrees to Nephrology referral. She will continue with [place] 10mg  daily, Tresiba 25 units sq weekly. She also uses Humalog 6 units twice daily before meals.  She has yet to schedule appt with Nephrology. I will place second referral. I DISCUSSED WITH THE PATIENT AT LENGTH REGARDING THE GOALS OF GLYCEMIC CONTROL AND POSSIBLE LONG-TERM COMPLICATIONS.  I  ALSO STRESSED THE IMPORTANCE OF COMPLIANCE WITH HOME GLUCOSE MONITORING, DIETARY RESTRICTIONS INCLUDING AVOIDANCE OF SUGARY DRINKS/PROCESSED FOODS,  ALONG WITH REGULAR EXERCISE.  I  ALSO STRESSED THE IMPORTANCE OF ANNUAL EYE EXAMS, SELF FOOT CARE AND COMPLIANCE WITH OFFICE VISITS.

## 2023-07-11 NOTE — Assessment & Plan Note (Signed)
Chronic, s/p liver transplant.

## 2023-07-11 NOTE — Assessment & Plan Note (Signed)

## 2023-07-11 NOTE — Assessment & Plan Note (Signed)
Chronic, she is now on [place]. I will check repeat microalbumin to document improvement. Importance of medication/dietary compliance was discussed with the patient.

## 2023-07-26 NOTE — Telephone Encounter (Signed)
Received filled out reorder/refill form for pt via fax.

## 2023-08-13 DIAGNOSIS — E1122 Type 2 diabetes mellitus with diabetic chronic kidney disease: Secondary | ICD-10-CM | POA: Diagnosis not present

## 2023-08-13 DIAGNOSIS — N1831 Chronic kidney disease, stage 3a: Secondary | ICD-10-CM | POA: Diagnosis not present

## 2023-08-13 DIAGNOSIS — R809 Proteinuria, unspecified: Secondary | ICD-10-CM | POA: Diagnosis not present

## 2023-08-13 DIAGNOSIS — Z944 Liver transplant status: Secondary | ICD-10-CM | POA: Diagnosis not present

## 2023-08-13 DIAGNOSIS — B1920 Unspecified viral hepatitis C without hepatic coma: Secondary | ICD-10-CM | POA: Diagnosis not present

## 2023-08-13 DIAGNOSIS — I129 Hypertensive chronic kidney disease with stage 1 through stage 4 chronic kidney disease, or unspecified chronic kidney disease: Secondary | ICD-10-CM | POA: Diagnosis not present

## 2023-08-13 DIAGNOSIS — E213 Hyperparathyroidism, unspecified: Secondary | ICD-10-CM | POA: Diagnosis not present

## 2023-08-14 LAB — LAB REPORT - SCANNED: EGFR: 47

## 2023-08-21 DIAGNOSIS — H33301 Unspecified retinal break, right eye: Secondary | ICD-10-CM | POA: Diagnosis not present

## 2023-08-21 DIAGNOSIS — H25812 Combined forms of age-related cataract, left eye: Secondary | ICD-10-CM | POA: Diagnosis not present

## 2023-09-18 DIAGNOSIS — N1831 Chronic kidney disease, stage 3a: Secondary | ICD-10-CM | POA: Diagnosis not present

## 2023-10-02 ENCOUNTER — Ambulatory Visit (INDEPENDENT_AMBULATORY_CARE_PROVIDER_SITE_OTHER): Payer: Medicare HMO

## 2023-10-02 DIAGNOSIS — Z0000 Encounter for general adult medical examination without abnormal findings: Secondary | ICD-10-CM

## 2023-10-02 DIAGNOSIS — Z0100 Encounter for examination of eyes and vision without abnormal findings: Secondary | ICD-10-CM | POA: Diagnosis not present

## 2023-10-02 NOTE — Progress Notes (Signed)
Subjective:   Kathryn Walsh is a 67 y.o. female who presents for Medicare Annual (Subsequent) preventive examination.  Visit Complete: Virtual I connected with  Kathryn Walsh on 10/02/23 by a audio enabled telemedicine application and verified that I am speaking with the correct person using two identifiers.  Patient Location: Home  Provider Location: Office/Clinic  I discussed the limitations of evaluation and management by telemedicine. The patient expressed understanding and agreed to proceed.  Vital Signs: Because this visit was a virtual/telehealth visit, some criteria may be missing or patient reported. Any vitals not documented were not able to be obtained and vitals that have been documented are patient reported.    Cardiac Risk Factors include: advanced age (>75men, >34 women);diabetes mellitus;dyslipidemia;hypertension     Objective:    Today's Vitals   There is no height or weight on file to calculate BMI.     10/02/2023   12:23 PM 09/27/2022    2:46 PM 08/30/2021    3:00 PM 08/25/2020    3:36 PM 07/02/2019   10:33 AM 08/22/2017    7:02 PM 01/31/2017   11:06 AM  Advanced Directives  Does Patient Have a Medical Advance Directive? Yes Yes Yes Yes Yes Yes No  Type of Estate agent of [place];Living will Living will Healthcare Power of [place];Living will Living will Healthcare Power of [place];Living will Healthcare Power of [place];Living will   Copy of Healthcare Power of Attorney in Chart? No - copy requested  No - copy requested  No - copy requested    Would patient like information on creating a medical advance directive?       No - Patient declined    Current Medications (verified) Outpatient Encounter Medications as of 10/02/2023  Medication Sig   amLODipine (NORVASC) 5 MG tablet TAKE 1 TABLET BY MOUTH TWICE A DAY   azaTHIOprine (IMURAN) 50 MG tablet Take 100 mg by mouth 2 (two) times daily.    Blood Glucose Monitoring Suppl  (ACCU-CHEK GUIDE ME) w/Device KIT Use to check blood sugar 3 times a day. 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   dapagliflozin propanediol (FARXIGA) 10 MG TABS tablet Take 1 tablet (10 mg total) by mouth daily before breakfast.   glucose blood (ACCU-CHEK GUIDE) test strip Use as instructed to check blood sugar 3 times a day. Dx code e11.65   insulin degludec (TRESIBA FLEXTOUCH) 200 UNIT/ML FlexTouch Pen Inject 25 units nightly   insulin lispro (HUMALOG KWIKPEN) 200 UNIT/ML KwikPen Inject 6 Units into the skin 2 (two) times daily before a meal. Please inject 6 units before breakfast and dinner; max dose titration 40 units daily   losartan (COZAAR) 25 MG tablet Take 25 mg by mouth daily.   pravastatin (PRAVACHOL) 40 MG tablet TAKE 1 TABLET BY MOUTH EVERYDAY AT BEDTIME   tacrolimus (PROGRAF) 1 MG capsule Take 2 mg in am and 3 mg in pm   cyclobenzaprine (FLEXERIL) 10 MG tablet TAKE 1 TABLET BY MOUTH EVERYDAY AT BEDTIME (Patient not taking: Reported on 10/02/2023)   TURMERIC PO Take by mouth. (Patient not taking: Reported on 10/02/2023)   No facility-administered encounter medications on file as of 10/02/2023.    Allergies (verified) Patient has no known allergies.   History: Past Medical History:  Diagnosis Date   Diabetes mellitus without complication (HCC)    High cholesterol    HTN (hypertension)    Liver transplant recipient [facility])  Past Surgical History:  Procedure Laterality Date   LIVER TRANSPLANTATION     Family History  Problem Relation Age of Onset   Healthy Mother    Healthy Father    Heart disease Sister    Heart disease Brother    Gout Brother    Heart disease Brother    Healthy Daughter    Diabetes Son    Social History   Socioeconomic History   Marital status: Single    Spouse name: Not on file   Number of children: Not on file   Years of education: Not on file   Highest education level: Not on file   Occupational History   Occupation: employed  Tobacco Use   Smoking status: Former    Current packs/day: 0.75    Average packs/day: 0.8 packs/day for 48.9 years (36.7 ttl pk-yrs)    Types: Cigarettes    Start date: 11/12/1974   Smokeless tobacco: Never   Tobacco comments:    encouraged to avoid smoking in the house and in her car  Vaping Use   Vaping status: Never Used  Substance and Sexual Activity   Alcohol use: No   Drug use: No   Sexual activity: Not Currently  Other Topics Concern   Not on file  Social History Narrative   Not on file   Social Determinants of Health   Financial Resource Strain: Low Risk  (10/02/2023)   Overall Financial Resource Strain (CARDIA)    Difficulty of Paying Living Expenses: Not hard at all  Food Insecurity: Food Insecurity Present (10/02/2023)   Hunger Vital Sign    Worried About Running Out of Food in the Last Year: Sometimes true    Ran Out of Food in the Last Year: Sometimes true  Transportation Needs: No Transportation Needs (10/02/2023)   PRAPARE - Administrator, Civil Service (Medical): No    Lack of Transportation (Non-Medical): No  Physical Activity: Inactive (10/02/2023)   Exercise Vital Sign    Days of Exercise per Week: 0 days    Minutes of Exercise per Session: 0 min  Stress: No Stress Concern Present (10/02/2023)   [employer] of Occupational Health - Occupational Stress Questionnaire    Feeling of Stress : Not at all  Social Connections: Socially Isolated (10/02/2023)   Social Connection and Isolation Panel [NHANES]    Frequency of Communication with Friends and Family: More than three times a week    Frequency of Social Gatherings with Friends and Family: Once a week    Attends Religious Services: Never    Database administrator or Organizations: No    Attends Engineer, structural: Never    Marital Status: Never married    Tobacco Counseling Counseling given: Not Answered Tobacco comments:  encouraged to avoid smoking in the house and in her car   Clinical Intake:  Pre-visit preparation completed: Yes  Pain : No/denies pain     Nutritional Risks: None Diabetes: Yes CBG done?: No Did pt. bring in CBG monitor from home?: No  How often do you need to have someone help you when you read instructions, pamphlets, or other written materials from your doctor or pharmacy?: 1 - Never  Interpreter Needed?: No  Information entered by :: NAllen LPN   Activities of Daily Living    10/02/2023   12:17 PM  In your present state of health, do you have any difficulty performing the following activities:  Hearing? 0  Vision? 0  Difficulty  concentrating or making decisions? 0  Walking or climbing stairs? 0  Dressing or bathing? 0  Doing errands, shopping? 0  Preparing Food and eating ? N  Using the Toilet? N  In the past six months, have you accidently leaked urine? N  Do you have problems with loss of bowel control? N  Managing your Medications? N  Managing your Finances? N  Housekeeping or managing your Housekeeping? N    Patient Care Team: Dorothyann Peng, MD as PCP - General (Internal Medicine) Harlan Stains, [facility] (Inactive) (Pharmacist) [facility], [place] (Ophthalmology)  Indicate any recent Medical Services you may have received from other than [facility] providers in the past year (date may be approximate).     Assessment:   This is a routine wellness examination for Kathryn Walsh.  Hearing/Vision screen Hearing Screening - Comments:: Denies hearing issues Vision Screening - Comments:: Regular eye exams, [facility]   Goals Addressed             This Visit's Progress    Patient Stated       10/02/2023, would like to gain weight       Depression Screen    10/02/2023   12:25 PM 07/08/2023    2:09 PM 02/27/2023    2:31 PM 12/06/2022    9:48 AM 09/27/2022    2:47 PM 09/10/2022    4:04 PM 08/30/2021    3:02 PM  PHQ 2/9 Scores  PHQ - 2 Score 0 0 0  0 0 0 0  PHQ- 9 Score 0 0 0        Fall Risk    10/02/2023   12:24 PM 07/08/2023    2:09 PM 02/27/2023    2:31 PM 12/06/2022    9:47 AM 09/27/2022    2:47 PM  Fall Risk   Falls in the past year? 0 0 0 0 0  Number falls in past yr: 0 0 0 0 0  Injury with Fall? 0 0 0 0 0  Risk for fall due to : Medication side effect No Fall Risks No Fall Risks No Fall Risks Medication side effect  Follow up Falls prevention discussed;Falls evaluation completed Falls evaluation completed Falls evaluation completed Falls evaluation completed Falls prevention discussed;Education provided;Falls evaluation completed    MEDICARE RISK AT HOME: Medicare Risk at Home Any stairs in or around the home?: Yes If so, are there any without handrails?: Yes Home free of loose throw rugs in walkways, pet beds, electrical cords, etc?: Yes Adequate lighting in your home to reduce risk of falls?: Yes Life alert?: No Use of a cane, walker or w/c?: No Grab bars in the bathroom?: No Shower chair or bench in shower?: Yes Elevated toilet seat or a handicapped toilet?: No  TIMED UP AND GO:  Was the test performed?  No    Cognitive Function:        10/02/2023   12:26 PM 09/27/2022    2:50 PM 08/30/2021    3:04 PM 08/25/2020    3:39 PM 07/02/2019   10:37 AM  6CIT Screen  What Year? 0 points 0 points 0 points 0 points 0 points  What month? 0 points 0 points 0 points 0 points 0 points  What time? 0 points 0 points 0 points 0 points 0 points  Count back from 20 0 points 2 points 0 points 0 points 0 points  Months in reverse 0 points 0 points 0 points 0 points 0 points  Repeat  phrase 0 points 0 points 2 points 0 points 0 points  Total Score 0 points 2 points 2 points 0 points 0 points    Immunizations Immunization History  Administered Date(s) Administered   DTaP 06/10/2012   Fluad Quad(high Dose 65+) 08/31/2022   Fluad Trivalent(High Dose 65+) 07/08/2023   Influenza, High Dose Seasonal PF 08/16/2021    Influenza,inj,Quad PF,6+ Mos 10/06/2013, 08/12/2015, 08/01/2018, 07/02/2019   Influenza-Unspecified 08/18/2012, 08/06/2018, 07/26/2020   Moderna Sars-Covid-2 Vaccination 11/03/2019, 12/01/2019, 07/26/2020   Pfizer Covid-19 Vaccine Bivalent Booster 59yrs & up 09/02/2021   Pneumococcal Conjugate-13 08/12/2015   Pneumococcal Polysaccharide-23 07/13/2013, 12/16/2018   Tdap 02/27/2023   Zoster Recombinant(Shingrix) 07/04/2022, 12/06/2022    TDAP status: Up to date  Flu Vaccine status: Up to date  Pneumococcal vaccine status: Up to date  Covid-19 vaccine status: Information provided on how to obtain vaccines.   Qualifies for Shingles Vaccine? Yes   Zostavax completed Yes   Shingrix Completed?: Yes  Screening Tests Health Maintenance  Topic Date Due   Lung Cancer Screening  Never done   OPHTHALMOLOGY EXAM  04/28/2023   COVID-19 Vaccine (5 - 2023-24 season) 06/30/2023   Pneumonia Vaccine 61+ Years old (4 of 4 - PPSV23 or PCV20) 12/17/2023   HEMOGLOBIN A1C  01/05/2024   Diabetic kidney evaluation - Urine ACR  07/07/2024   FOOT EXAM  07/07/2024   Diabetic kidney evaluation - eGFR measurement  08/13/2024   Medicare Annual Wellness (AWV)  10/01/2024   Fecal DNA (Cologuard)  03/06/2025   MAMMOGRAM  05/29/2025   DTaP/Tdap/Td (3 - Td or Tdap) 02/26/2033   INFLUENZA VACCINE  Completed   DEXA SCAN  Completed   Hepatitis C Screening  Completed   Zoster Vaccines- Shingrix  Completed   HPV VACCINES  Aged Out    Health Maintenance  Health Maintenance Due  Topic Date Due   Lung Cancer Screening  Never done   OPHTHALMOLOGY EXAM  04/28/2023   COVID-19 Vaccine (5 - 2023-24 season) 06/30/2023   Pneumonia Vaccine 63+ Years old (4 of 4 - PPSV23 or PCV20) 12/17/2023    Colorectal cancer screening: Type of screening: Cologuard. Completed 03/06/2022. Repeat every 3 years  Mammogram status: Completed 05/30/2023. Repeat every year  Bone Density status: scheduled for 03/04/2024  Lung Cancer  Screening: (Low Dose CT Chest recommended if Age 48-80 years, 20 pack-year currently smoking OR have quit w/in 15years.) does qualify.   Lung Cancer Screening Referral: declines  Additional Screening:  Hepatitis C Screening: does qualify; Completed 09/14/2021  Vision Screening: Recommended annual ophthalmology exams for early detection of glaucoma and other disorders of the eye. Is the patient up to date with their annual eye exam?  No  Who is the provider or what is the name of the office in which the patient attends annual eye exams? [facility] If pt is not established with a provider, would they like to be referred to a provider to establish care? No .   Dental Screening: Recommended annual dental exams for proper oral hygiene  Diabetic Foot Exam: Diabetic Foot Exam: Completed 07/08/2023  Community Resource Referral / Chronic Care Management: CRR required this visit?  Yes   CCM required this visit?  No     Plan:     I have personally reviewed and noted the following in the patient's chart:   Medical and social history Use of alcohol, tobacco or illicit drugs  Current medications and supplements including opioid prescriptions. Patient is not currently taking opioid  prescriptions. Functional ability and status Nutritional status Physical activity Advanced directives List of other physicians Hospitalizations, surgeries, and ER visits in previous 12 months Vitals Screenings to include cognitive, depression, and falls Referrals and appointments  In addition, I have reviewed and discussed with patient certain preventive protocols, quality metrics, and best practice recommendations. A written personalized care plan for preventive services as well as general preventive health recommendations were provided to patient.     Barb Merino, LPN   16/10/958   After Visit Summary: (Pick Up) Due to this being a telephonic visit, with patients personalized plan was offered to  patient and patient has requested to Pick up at office.  Nurse Notes: none

## 2023-10-02 NOTE — Patient Instructions (Signed)
Kathryn Walsh , Thank you for taking time to come for your Medicare Wellness Visit. I appreciate your ongoing commitment to your health goals. Please review the following plan we discussed and let me know if I can assist you in the future.   Referrals/Orders/Follow-Ups/Clinician Recommendations: Amb referral for food insecurity     This is a list of the screening recommended for you and due dates:  Health Maintenance  Topic Date Due   Screening for Lung Cancer  Never done   Eye exam for diabetics  04/28/2023   COVID-19 Vaccine (5 - 2023-24 season) 06/30/2023   Pneumonia Vaccine (4 of 4 - PPSV23 or PCV20) 12/17/2023   Hemoglobin A1C  01/05/2024   Yearly kidney health urinalysis for diabetes  07/07/2024   Complete foot exam   07/07/2024   Yearly kidney function blood test for diabetes  08/13/2024   Medicare Annual Wellness Visit  10/01/2024   Cologuard (Stool DNA test)  03/06/2025   Mammogram  05/29/2025   DTaP/Tdap/Td vaccine (3 - Td or Tdap) 02/26/2033   Flu Shot  Completed   DEXA scan (bone density measurement)  Completed   Hepatitis C Screening  Completed   Zoster (Shingles) Vaccine  Completed   HPV Vaccine  Aged Out    Advanced directives: (Copy Requested) Please bring a copy of your health care power of attorney and living will to the office to be added to your chart at your convenience.  Next Medicare Annual Wellness Visit scheduled for next year: No, office will schedule  Insert Preventive Care attachment Insert FALL PREVENTION attachment if needed

## 2023-10-04 ENCOUNTER — Telehealth: Payer: Self-pay | Admitting: *Deleted

## 2023-10-04 NOTE — Progress Notes (Signed)
  Care Coordination  Outreach Note  10/04/2023 Name: Rosaura Vacanti MRN: [id number] DOB: 10/08/1956   Care Coordination Outreach Attempts: An unsuccessful telephone outreach was attempted today to offer the patient information about available care coordination services.  Follow Up Plan:  Additional outreach attempts will be made to offer the patient care coordination information and services.   Encounter Outcome:  No Answer  Gwenevere Ghazi  Care Coordination Care Guide  Direct Dial: [phone]

## 2023-10-14 NOTE — Progress Notes (Signed)
  Care Coordination   Note   10/14/2023 Name: Kathryn Walsh MRN: [id number] DOB: Jul 09, 1956  Kathryn Walsh is a 67 y.o. year old female who sees Dorothyann Peng, MD for primary care. I reached out to [employer] by phone today to offer care coordination services.  Ms. Rubalcava was given information about Care Coordination services today including:   The Care Coordination services include support from the care team which includes your Nurse Coordinator, Clinical Social Worker, or Pharmacist.  The Care Coordination team is here to help remove barriers to the health concerns and goals most important to you. Care Coordination services are voluntary, and the patient may decline or stop services at any time by request to their care team member.   Care Coordination Consent Status: Patient did not agree to participate in care coordination services at this time.    Encounter Outcome:  Patient Refused  [facility]  Care Coordination Care Guide  Direct Dial: [phone]

## 2023-11-07 ENCOUNTER — Ambulatory Visit (INDEPENDENT_AMBULATORY_CARE_PROVIDER_SITE_OTHER): Payer: Medicare HMO | Admitting: Internal Medicine

## 2023-11-07 ENCOUNTER — Encounter: Payer: Self-pay | Admitting: Internal Medicine

## 2023-11-07 VITALS — BP 132/74 | HR 83 | Temp 98.1°F | Ht 67.0 in | Wt 163.8 lb

## 2023-11-07 DIAGNOSIS — E2839 Other primary ovarian failure: Secondary | ICD-10-CM | POA: Diagnosis not present

## 2023-11-07 DIAGNOSIS — D696 Thrombocytopenia, unspecified: Secondary | ICD-10-CM | POA: Diagnosis not present

## 2023-11-07 DIAGNOSIS — Z794 Long term (current) use of insulin: Secondary | ICD-10-CM | POA: Diagnosis not present

## 2023-11-07 DIAGNOSIS — Z944 Liver transplant status: Secondary | ICD-10-CM

## 2023-11-07 DIAGNOSIS — E1122 Type 2 diabetes mellitus with diabetic chronic kidney disease: Secondary | ICD-10-CM | POA: Diagnosis not present

## 2023-11-07 DIAGNOSIS — N1831 Chronic kidney disease, stage 3a: Secondary | ICD-10-CM

## 2023-11-07 DIAGNOSIS — F17200 Nicotine dependence, unspecified, uncomplicated: Secondary | ICD-10-CM | POA: Diagnosis not present

## 2023-11-07 DIAGNOSIS — I129 Hypertensive chronic kidney disease with stage 1 through stage 4 chronic kidney disease, or unspecified chronic kidney disease: Secondary | ICD-10-CM

## 2023-11-07 NOTE — Progress Notes (Signed)
 I,Kathryn Walsh, CMA,acting as a neurosurgeon for Kathryn LOISE Slocumb, MD.,have documented all relevant documentation on the behalf of Kathryn LOISE Slocumb, MD,as directed by  Kathryn LOISE Slocumb, MD while in the presence of Kathryn LOISE Slocumb, MD.  Subjective:  Patient ID: Kathryn Walsh , female    DOB: Mar 02, 1956 , 68 y.o.   MRN: [id number]  Chief Complaint  Patient presents with   Diabetes   Hypertension    HPI  She presents today for DM & HTN f/u. She denies having any headaches, chest pain and shortness of breath. She was recently started on Losartan  by kidney specialist. She has not had any issues with the medication.  She denies having any other concerns at this time.     Diabetes She presents for her follow-up diabetic visit. She has type 2 diabetes mellitus. Her disease course has been worsening. There are no hypoglycemic associated symptoms. Pertinent negatives for hypoglycemia include no dizziness or headaches. Pertinent negatives for diabetes include no blurred vision, no chest pain, no fatigue, no polydipsia, no polyphagia and no polyuria. There are no hypoglycemic complications. Risk factors for coronary artery disease include diabetes mellitus, dyslipidemia, hypertension and sedentary lifestyle. Her weight is decreasing steadily. She is following a generally healthy diet. When asked about meal planning, she reported none. She has not had a previous visit with a dietitian. She participates in exercise intermittently. Her breakfast blood glucose is taken between 9-10 am. Her breakfast blood glucose range is generally 140-180 mg/dl. She does not see a podiatrist.Eye exam current: Has a appt coming up. .  Hypertension This is a chronic problem. The current episode started more than 1 year ago. The problem has been gradually improving since onset. The problem is controlled. Pertinent negatives include no blurred vision, chest pain, headaches, palpitations or shortness of breath. The current treatment  provides moderate improvement. Compliance problems include exercise.      Past Medical History:  Diagnosis Date   Diabetes mellitus without complication (HCC)    High cholesterol    HTN (hypertension)    Liver transplant recipient [facility])      Family History  Problem Relation Age of Onset   Healthy Mother    Healthy Father    Heart disease Sister    Heart disease Brother    Gout Brother    Heart disease Brother    Healthy Daughter    Diabetes Son      Current Outpatient Medications:    amLODipine  (NORVASC ) 5 MG tablet, TAKE 1 TABLET BY MOUTH TWICE A DAY, Disp: 180 tablet, Rfl: 1   azaTHIOprine  (IMURAN ) 50 MG tablet, Take 100 mg by mouth 2 (two) times daily. , Disp: , Rfl:    Blood Glucose Monitoring Suppl (ACCU-CHEK GUIDE ME) w/Device KIT, Use to check blood sugar 3 times a day. Dx code e11.65, Disp: 1 kit, Rfl: 3   Cholecalciferol  25 MCG (1000 UT) capsule, Take 4,000 Units by mouth daily., Disp: , Rfl:    dapagliflozin  propanediol (FARXIGA ) 10 MG TABS tablet, Take 1 tablet (10 mg total) by mouth daily before breakfast., Disp: 30 tablet, Rfl: 5   glucose blood (ACCU-CHEK GUIDE) test strip, Use as instructed to check blood sugar 3 times a day. Dx code e11.65, Disp: 100 each, Rfl: 12   insulin  degludec (TRESIBA  FLEXTOUCH) 200 UNIT/ML FlexTouch Pen, Inject 25 units nightly, Disp: 9 mL, Rfl: 3   insulin  lispro (HUMALOG  KWIKPEN) 200 UNIT/ML KwikPen, Inject 6 Units into the skin 2 (two) times daily  before a meal. Please inject 6 units before breakfast and dinner; max dose titration 40 units daily, Disp: 5 mL, Rfl: 1   losartan  (COZAAR ) 25 MG tablet, Take 25 mg by mouth daily., Disp: , Rfl:    pravastatin  (PRAVACHOL ) 40 MG tablet, TAKE 1 TABLET BY MOUTH EVERYDAY AT BEDTIME, Disp: 90 tablet, Rfl: 1   tacrolimus  (PROGRAF ) 1 MG capsule, Take 2 mg in am and 3 mg in pm, Disp: , Rfl:    cyclobenzaprine  (FLEXERIL ) 10 MG tablet, TAKE 1 TABLET BY MOUTH EVERYDAY AT BEDTIME (Patient not taking:  Reported on 11/07/2023), Disp: 30 tablet, Rfl: 0   TURMERIC PO, Take by mouth. (Patient not taking: Reported on 10/02/2023), Disp: , Rfl:    No Known Allergies   Review of Systems  Constitutional: Negative.  Negative for fatigue.  Eyes:  Negative for blurred vision.  Respiratory: Negative.  Negative for shortness of breath.   Cardiovascular: Negative.  Negative for chest pain and palpitations.  Gastrointestinal: Negative.   Endocrine: Negative for polydipsia, polyphagia and polyuria.  Neurological: Negative.  Negative for dizziness and headaches.  Psychiatric/Behavioral: Negative.       Today's Vitals   11/07/23 1434  BP: 132/74  Pulse: 83  Temp: 98.1 F (36.7 C)  SpO2: 98%  Weight: 163 lb 12.8 oz (74.3 kg)  Height: 5' 7 (1.702 m)   Body mass index is 25.65 kg/m.  Wt Readings from Last 3 Encounters:  11/07/23 163 lb 12.8 oz (74.3 kg)  07/08/23 157 lb 6.4 oz (71.4 kg)  02/27/23 156 lb 12.8 oz (71.1 kg)     Objective:  Physical Exam Vitals and nursing note reviewed.  Constitutional:      Appearance: Normal appearance.  HENT:     Head: Normocephalic and atraumatic.  Eyes:     Extraocular Movements: Extraocular movements intact.  Cardiovascular:     Rate and Rhythm: Normal rate and regular rhythm.     Heart sounds: Normal heart sounds.  Pulmonary:     Effort: Pulmonary effort is normal.     Breath sounds: Normal breath sounds.  Musculoskeletal:     Cervical back: Normal range of motion.  Skin:    General: Skin is warm.  Neurological:     General: No focal deficit present.     Mental Status: She is alert.  Psychiatric:        Mood and Affect: Mood normal.        Behavior: Behavior normal.         Assessment And Plan:  Type 2 diabetes mellitus with stage 3a chronic kidney disease, with long-term current use of insulin  [facility]) Assessment & Plan: Chronic, she is now followed by Nephrology.   She is encouraged to stay well hydrated, avoid NSAIDs and keep BP/BS  controlled to prevent progression of CKD.  Regarding her DM, she will continue with Farxiga  10mg  daily, Tresiba  25 units sq nightly. She is advised to notify me if her fasting sugars fall below 90, I plan to decrease Tresiba  dose as indicated.  She also uses Humalog  6 units twice daily before meals.    Orders: -     CMP14+EGFR -     Hemoglobin A1c  Parenchymal renal hypertension, stage 1 through stage 4 or unspecified chronic kidney disease Assessment & Plan: Chronic, fair control. Goal BP<120/80.  She will continue with amlodipine  5mg  daily. She is now also taking losartan  25mg  as per Nephrology.  She is encouraged to follow low sodium diet. She will  f/u in four months for re-evaluation.   Orders: -     CMP14+EGFR  Thrombocytopenia (HCC) -     CBC  Moderate tobacco use disorder Assessment & Plan: She declines LDCT today (and has done so in the past).  She prefers to speak with her daughter about moving forward with testing. The purpose of the test was explained to her in detail.    Estrogen deficiency Assessment & Plan: She is scheduled for bone density in May 2025. She is encouraged to engage in weight-bearing exercises at least 2-3 days per week.    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  She is still followed by [facility] for immunosuppression f/u.       Return for 4 month bp & dm .  Patient was given opportunity to ask questions. Patient verbalized understanding of the plan and was able to repeat key elements of the plan. All questions were answered to their satisfaction.    I, Kathryn LOISE Slocumb, MD, have reviewed all documentation for this visit. The documentation on 11/09/23 for the exam, diagnosis, procedures, and orders are all accurate and complete.   IF YOU HAVE BEEN REFERRED TO A SPECIALIST, IT MAY TAKE 1-2 WEEKS TO SCHEDULE/PROCESS THE REFERRAL. IF YOU HAVE NOT HEARD FROM US /SPECIALIST IN TWO WEEKS, PLEASE GIVE US  A  CALL AT [phone].   THE PATIENT IS ENCOURAGED TO PRACTICE SOCIAL DISTANCING DUE TO THE COVID-19 PANDEMIC.

## 2023-11-07 NOTE — Patient Instructions (Signed)

## 2023-11-08 LAB — CMP14+EGFR
ALT: 17 [IU]/L (ref 0–32)
AST: 24 [IU]/L (ref 0–40)
Albumin: 3.7 g/dL — ABNORMAL LOW (ref 3.9–4.9)
Alkaline Phosphatase: 224 [IU]/L — ABNORMAL HIGH (ref 44–121)
BUN/Creatinine Ratio: 18 (ref 12–28)
BUN: 23 mg/dL (ref 8–27)
Bilirubin Total: 0.3 mg/dL (ref 0.0–1.2)
CO2: 21 mmol/L (ref 20–29)
Calcium: 8.9 mg/dL (ref 8.7–10.3)
Chloride: 108 mmol/L — ABNORMAL HIGH (ref 96–106)
Creatinine, Ser: 1.27 mg/dL — ABNORMAL HIGH (ref 0.57–1.00)
Globulin, Total: 3.1 g/dL (ref 1.5–4.5)
Glucose: 111 mg/dL — ABNORMAL HIGH (ref 70–99)
Potassium: 4.3 mmol/L (ref 3.5–5.2)
Sodium: 144 mmol/L (ref 134–144)
Total Protein: 6.8 g/dL (ref 6.0–8.5)
eGFR: 46 mL/min/{1.73_m2} — ABNORMAL LOW (ref >59)

## 2023-11-08 LAB — CBC
Hematocrit: 33.9 % — ABNORMAL LOW (ref 34.0–46.6)
Hemoglobin: 11.1 g/dL (ref 11.1–15.9)
MCH: 31.2 pg (ref 26.6–33.0)
MCHC: 32.7 g/dL (ref 31.5–35.7)
MCV: 95 fL (ref 79–97)
Platelets: 108 10*3/uL — ABNORMAL LOW (ref 150–450)
RBC: 3.56 x10E6/uL — ABNORMAL LOW (ref 3.77–5.28)
RDW: 13.4 % (ref 11.7–15.4)
WBC: 3.5 10*3/uL (ref 3.4–10.8)

## 2023-11-08 LAB — HEMOGLOBIN A1C
Est. average glucose Bld gHb Est-mCnc: 128 mg/dL
Hgb A1c MFr Bld: 6.1 % — ABNORMAL HIGH (ref 4.8–5.6)

## 2023-11-09 DIAGNOSIS — E2839 Other primary ovarian failure: Secondary | ICD-10-CM | POA: Insufficient documentation

## 2023-11-09 DIAGNOSIS — D696 Thrombocytopenia, unspecified: Secondary | ICD-10-CM | POA: Insufficient documentation

## 2023-11-09 DIAGNOSIS — F17200 Nicotine dependence, unspecified, uncomplicated: Secondary | ICD-10-CM | POA: Insufficient documentation

## 2023-11-09 NOTE — Assessment & Plan Note (Signed)
 She is scheduled for bone density in May 2025. She is encouraged to engage in weight-bearing exercises at least 2-3 days per week.

## 2023-11-09 NOTE — Assessment & Plan Note (Signed)
 Chronic, she is now followed by Nephrology.   She is encouraged to stay well hydrated, avoid NSAIDs and keep BP/BS controlled to prevent progression of CKD.  Regarding her DM, she will continue with Farxiga  10mg  daily, Tresiba  25 units sq nightly. She is advised to notify me if her fasting sugars fall below 90, I plan to decrease Tresiba  dose as indicated.  She also uses Humalog  6 units twice daily before meals.

## 2023-11-09 NOTE — Assessment & Plan Note (Signed)
 She declines LDCT today (and has done so in the past).  She prefers to speak with her daughter about moving forward with testing. The purpose of the test was explained to her in detail.

## 2023-11-09 NOTE — Assessment & Plan Note (Signed)
 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  She is still followed by [facility] for immunosuppression f/u.

## 2023-11-09 NOTE — Assessment & Plan Note (Signed)
 Chronic, fair control. Goal BP<120/80.  She will continue with amlodipine 5mg  daily. She is now also taking losartan 25mg  as per Nephrology.  She is encouraged to follow low sodium diet. She will f/u in four months for re-evaluation.

## 2023-12-05 ENCOUNTER — Telehealth: Payer: Self-pay

## 2023-12-05 NOTE — Telephone Encounter (Signed)
 PAP: Patient assistance application for Farxiga , Humalog , and Tresiba  through [employer] has been mailed to [employer] home address on file. Provider portion of application will be faxed to provider's office.  Please note I have faxed to provider portion of the applications to Dr. Catheryn Slocumb for review

## 2023-12-21 DIAGNOSIS — E119 Type 2 diabetes mellitus without complications: Secondary | ICD-10-CM | POA: Diagnosis not present

## 2023-12-21 DIAGNOSIS — R590 Localized enlarged lymph nodes: Secondary | ICD-10-CM | POA: Diagnosis not present

## 2023-12-21 DIAGNOSIS — Z944 Liver transplant status: Secondary | ICD-10-CM | POA: Diagnosis not present

## 2023-12-21 DIAGNOSIS — K759 Inflammatory liver disease, unspecified: Secondary | ICD-10-CM | POA: Diagnosis not present

## 2023-12-21 DIAGNOSIS — I10 Essential (primary) hypertension: Secondary | ICD-10-CM | POA: Diagnosis not present

## 2023-12-21 DIAGNOSIS — D649 Anemia, unspecified: Secondary | ICD-10-CM | POA: Diagnosis not present

## 2023-12-21 DIAGNOSIS — I2510 Atherosclerotic heart disease of native coronary artery without angina pectoris: Secondary | ICD-10-CM | POA: Diagnosis not present

## 2023-12-21 DIAGNOSIS — R9431 Abnormal electrocardiogram [ECG] [EKG]: Secondary | ICD-10-CM | POA: Diagnosis not present

## 2023-12-21 DIAGNOSIS — R748 Abnormal levels of other serum enzymes: Secondary | ICD-10-CM | POA: Diagnosis not present

## 2023-12-21 DIAGNOSIS — R1011 Right upper quadrant pain: Secondary | ICD-10-CM | POA: Diagnosis not present

## 2023-12-21 DIAGNOSIS — R7401 Elevation of levels of liver transaminase levels: Secondary | ICD-10-CM | POA: Diagnosis not present

## 2023-12-21 DIAGNOSIS — D696 Thrombocytopenia, unspecified: Secondary | ICD-10-CM | POA: Diagnosis not present

## 2023-12-21 DIAGNOSIS — N281 Cyst of kidney, acquired: Secondary | ICD-10-CM | POA: Diagnosis not present

## 2023-12-21 DIAGNOSIS — N179 Acute kidney failure, unspecified: Secondary | ICD-10-CM | POA: Diagnosis not present

## 2023-12-21 DIAGNOSIS — R918 Other nonspecific abnormal finding of lung field: Secondary | ICD-10-CM | POA: Diagnosis not present

## 2023-12-21 DIAGNOSIS — K7460 Unspecified cirrhosis of liver: Secondary | ICD-10-CM | POA: Diagnosis not present

## 2023-12-21 DIAGNOSIS — Z9049 Acquired absence of other specified parts of digestive tract: Secondary | ICD-10-CM | POA: Diagnosis not present

## 2023-12-21 DIAGNOSIS — R160 Hepatomegaly, not elsewhere classified: Secondary | ICD-10-CM | POA: Diagnosis not present

## 2023-12-21 DIAGNOSIS — R162 Hepatomegaly with splenomegaly, not elsewhere classified: Secondary | ICD-10-CM | POA: Diagnosis not present

## 2023-12-22 DIAGNOSIS — D696 Thrombocytopenia, unspecified: Secondary | ICD-10-CM | POA: Diagnosis not present

## 2023-12-22 DIAGNOSIS — K769 Liver disease, unspecified: Secondary | ICD-10-CM | POA: Diagnosis not present

## 2023-12-22 DIAGNOSIS — E43 Unspecified severe protein-calorie malnutrition: Secondary | ICD-10-CM | POA: Diagnosis not present

## 2023-12-22 DIAGNOSIS — E1122 Type 2 diabetes mellitus with diabetic chronic kidney disease: Secondary | ICD-10-CM | POA: Diagnosis not present

## 2023-12-22 DIAGNOSIS — R591 Generalized enlarged lymph nodes: Secondary | ICD-10-CM | POA: Diagnosis not present

## 2023-12-22 DIAGNOSIS — Z794 Long term (current) use of insulin: Secondary | ICD-10-CM | POA: Diagnosis not present

## 2023-12-22 DIAGNOSIS — R768 Other specified abnormal immunological findings in serum: Secondary | ICD-10-CM | POA: Diagnosis not present

## 2023-12-22 DIAGNOSIS — E119 Type 2 diabetes mellitus without complications: Secondary | ICD-10-CM | POA: Diagnosis not present

## 2023-12-22 DIAGNOSIS — I10 Essential (primary) hypertension: Secondary | ICD-10-CM | POA: Diagnosis not present

## 2023-12-22 DIAGNOSIS — C3480 Malignant neoplasm of overlapping sites of unspecified bronchus and lung: Secondary | ICD-10-CM | POA: Diagnosis not present

## 2023-12-22 DIAGNOSIS — Z6825 Body mass index (BMI) 25.0-25.9, adult: Secondary | ICD-10-CM | POA: Diagnosis not present

## 2023-12-22 DIAGNOSIS — Z0389 Encounter for observation for other suspected diseases and conditions ruled out: Secondary | ICD-10-CM | POA: Diagnosis not present

## 2023-12-22 DIAGNOSIS — R918 Other nonspecific abnormal finding of lung field: Secondary | ICD-10-CM | POA: Diagnosis not present

## 2023-12-22 DIAGNOSIS — K766 Portal hypertension: Secondary | ICD-10-CM | POA: Diagnosis not present

## 2023-12-22 DIAGNOSIS — R9431 Abnormal electrocardiogram [ECG] [EKG]: Secondary | ICD-10-CM | POA: Diagnosis not present

## 2023-12-22 DIAGNOSIS — C3411 Malignant neoplasm of upper lobe, right bronchus or lung: Secondary | ICD-10-CM | POA: Diagnosis not present

## 2023-12-22 DIAGNOSIS — N179 Acute kidney failure, unspecified: Secondary | ICD-10-CM | POA: Diagnosis not present

## 2023-12-22 DIAGNOSIS — E8809 Other disorders of plasma-protein metabolism, not elsewhere classified: Secondary | ICD-10-CM | POA: Diagnosis not present

## 2023-12-22 DIAGNOSIS — R748 Abnormal levels of other serum enzymes: Secondary | ICD-10-CM | POA: Diagnosis not present

## 2023-12-22 DIAGNOSIS — R531 Weakness: Secondary | ICD-10-CM | POA: Diagnosis not present

## 2023-12-22 DIAGNOSIS — R7989 Other specified abnormal findings of blood chemistry: Secondary | ICD-10-CM | POA: Diagnosis not present

## 2023-12-22 DIAGNOSIS — R590 Localized enlarged lymph nodes: Secondary | ICD-10-CM | POA: Diagnosis not present

## 2023-12-22 DIAGNOSIS — C3490 Malignant neoplasm of unspecified part of unspecified bronchus or lung: Secondary | ICD-10-CM | POA: Diagnosis not present

## 2023-12-22 DIAGNOSIS — R7401 Elevation of levels of liver transaminase levels: Secondary | ICD-10-CM | POA: Diagnosis not present

## 2023-12-22 DIAGNOSIS — J984 Other disorders of lung: Secondary | ICD-10-CM | POA: Diagnosis not present

## 2023-12-22 DIAGNOSIS — D849 Immunodeficiency, unspecified: Secondary | ICD-10-CM | POA: Diagnosis not present

## 2023-12-22 DIAGNOSIS — M899 Disorder of bone, unspecified: Secondary | ICD-10-CM | POA: Diagnosis not present

## 2023-12-22 DIAGNOSIS — Z8679 Personal history of other diseases of the circulatory system: Secondary | ICD-10-CM | POA: Diagnosis not present

## 2023-12-22 DIAGNOSIS — C7951 Secondary malignant neoplasm of bone: Secondary | ICD-10-CM | POA: Diagnosis not present

## 2023-12-22 DIAGNOSIS — Z944 Liver transplant status: Secondary | ICD-10-CM | POA: Diagnosis not present

## 2023-12-22 DIAGNOSIS — R0989 Other specified symptoms and signs involving the circulatory and respiratory systems: Secondary | ICD-10-CM | POA: Diagnosis not present

## 2023-12-22 DIAGNOSIS — C771 Secondary and unspecified malignant neoplasm of intrathoracic lymph nodes: Secondary | ICD-10-CM | POA: Diagnosis not present

## 2023-12-22 DIAGNOSIS — K7469 Other cirrhosis of liver: Secondary | ICD-10-CM | POA: Diagnosis not present

## 2023-12-22 DIAGNOSIS — R9389 Abnormal findings on diagnostic imaging of other specified body structures: Secondary | ICD-10-CM | POA: Diagnosis not present

## 2023-12-22 DIAGNOSIS — D649 Anemia, unspecified: Secondary | ICD-10-CM | POA: Diagnosis not present

## 2023-12-24 NOTE — Telephone Encounter (Signed)
 Attempted to reach patient to make sure she received her patient assistance applications that were mailed to her address on file on 2/6.  Left a message to return my call

## 2023-12-25 ENCOUNTER — Other Ambulatory Visit: Payer: Self-pay | Admitting: Internal Medicine

## 2023-12-27 NOTE — Telephone Encounter (Signed)
 2nd attempted to reach patient to make sure she received her patient assistance applications that were mailed to her address on file on 2/6.  Left a message to return my call

## 2023-12-28 ENCOUNTER — Other Ambulatory Visit: Payer: Self-pay | Admitting: Internal Medicine

## 2023-12-28 DIAGNOSIS — I129 Hypertensive chronic kidney disease with stage 1 through stage 4 chronic kidney disease, or unspecified chronic kidney disease: Secondary | ICD-10-CM

## 2023-12-31 ENCOUNTER — Telehealth: Payer: Self-pay

## 2023-12-31 NOTE — Transitions of Care (Post Inpatient/ED Visit) (Signed)
   12/31/2023  Name: Kathryn Walsh MRN: [id number] DOB: Feb 01, 1956  Today's TOC FU Call Status: Today's TOC FU Call Status:: Successful TOC FU Call Completed TOC FU Call Complete Date: 12/31/23 Patient's Name and Date of Birth confirmed.  Transition Care Management Follow-up Telephone Call Date of Discharge: 12/30/23 Discharge Facility[facility]) [facility] Facility: [facility] Type of Discharge: Inpatient Admission Primary Inpatient Discharge Diagnosis:: Lung mass, elevated LFTs in Liver-transplant patient How have you been since you were released from the hospital?: Better Any questions or concerns?: No  Items Reviewed: Did you receive and understand the discharge instructions provided?: Yes Medications obtained,verified, and reconciled?: Partial Review Completed Reason for Partial Mediation Review: Patient stated she thoroughly reviewed her meds with clinician prior to being discharged from [facility] Any new allergies since your discharge?: No Dietary orders reviewed?: Yes Type of Diet Ordered:: Carbohydrate level 2 (60gm/meal) Do you have support at home?: Yes People in Home: child(ren), adult Name of Support/Comfort Primary Source: patient states she lives with daughter  Medications Reviewed Today: Medications Reviewed Today   Medications were not reviewed in this encounter     Home Care and Equipment/Supplies: Were Home Health Services Ordered?: No Any new equipment or medical supplies ordered?: No  Functional Questionnaire: Do you need assistance with bathing/showering or dressing?: Yes (daughter assists with Activities of daily Living (ADLs) as needed) Do you need assistance with meal preparation?: Yes (daughter assists with Activities of daily Living (ADLs) as needed) Do you need assistance with eating?: No Do you have difficulty maintaining continence: No Do you need assistance with getting out of bed/getting out of a  chair/moving?: Yes (daughter assists with Activities of daily Living (ADLs) as needed) Do you have difficulty managing or taking your medications?: Yes (daughter assists with Activities of daily Living (ADLs) as needed)  Follow up appointments reviewed: PCP Follow-up appointment confirmed?: No (Patient states she saw her PCP this past January & has an upcoming appointment with PCP (noted not to be for several months), RNCM emphasized importance of a hospital follow-up with patient's PCP - patient stated she would call PCP herself for follow up) MD Provider Line Number:[phone] Given: No Specialist Hospital Follow-up appointment confirmed?: Yes Date of Specialist follow-up appointment?: 01/06/24 Follow-Up Specialty Provider:: Dr. Garner Gavel, [facility] Do you need transportation to your follow-up appointment?: No Do you understand care options if your condition(s) worsen?: Yes-patient verbalized understanding   Reya Aurich A. Mliss Fritz RN, BA, [facility], CRRN [facility] RN Care Manager, Transition of Care [phone]

## 2024-01-06 DIAGNOSIS — G9340 Encephalopathy, unspecified: Secondary | ICD-10-CM | POA: Diagnosis not present

## 2024-01-06 DIAGNOSIS — N189 Chronic kidney disease, unspecified: Secondary | ICD-10-CM | POA: Diagnosis not present

## 2024-01-06 DIAGNOSIS — R251 Tremor, unspecified: Secondary | ICD-10-CM | POA: Diagnosis not present

## 2024-01-06 DIAGNOSIS — M4802 Spinal stenosis, cervical region: Secondary | ICD-10-CM | POA: Diagnosis not present

## 2024-01-06 DIAGNOSIS — R531 Weakness: Secondary | ICD-10-CM | POA: Diagnosis not present

## 2024-01-06 DIAGNOSIS — D709 Neutropenia, unspecified: Secondary | ICD-10-CM | POA: Diagnosis not present

## 2024-01-06 DIAGNOSIS — D696 Thrombocytopenia, unspecified: Secondary | ICD-10-CM | POA: Diagnosis not present

## 2024-01-06 DIAGNOSIS — I4891 Unspecified atrial fibrillation: Secondary | ICD-10-CM | POA: Diagnosis not present

## 2024-01-06 DIAGNOSIS — M899 Disorder of bone, unspecified: Secondary | ICD-10-CM | POA: Diagnosis not present

## 2024-01-06 DIAGNOSIS — Z944 Liver transplant status: Secondary | ICD-10-CM | POA: Diagnosis not present

## 2024-01-06 DIAGNOSIS — R5081 Fever presenting with conditions classified elsewhere: Secondary | ICD-10-CM | POA: Diagnosis not present

## 2024-01-06 DIAGNOSIS — E119 Type 2 diabetes mellitus without complications: Secondary | ICD-10-CM | POA: Diagnosis not present

## 2024-01-06 DIAGNOSIS — E871 Hypo-osmolality and hyponatremia: Secondary | ICD-10-CM | POA: Diagnosis not present

## 2024-01-06 DIAGNOSIS — Z452 Encounter for adjustment and management of vascular access device: Secondary | ICD-10-CM | POA: Diagnosis not present

## 2024-01-06 DIAGNOSIS — D849 Immunodeficiency, unspecified: Secondary | ICD-10-CM | POA: Diagnosis not present

## 2024-01-06 DIAGNOSIS — K754 Autoimmune hepatitis: Secondary | ICD-10-CM | POA: Diagnosis not present

## 2024-01-06 DIAGNOSIS — C802 Malignant neoplasm associated with transplanted organ: Secondary | ICD-10-CM | POA: Diagnosis not present

## 2024-01-06 DIAGNOSIS — K7400 Hepatic fibrosis, unspecified: Secondary | ICD-10-CM | POA: Diagnosis not present

## 2024-01-06 DIAGNOSIS — T8649 Other complications of liver transplant: Secondary | ICD-10-CM | POA: Diagnosis not present

## 2024-01-06 DIAGNOSIS — K7469 Other cirrhosis of liver: Secondary | ICD-10-CM | POA: Diagnosis not present

## 2024-01-06 DIAGNOSIS — R7989 Other specified abnormal findings of blood chemistry: Secondary | ICD-10-CM | POA: Diagnosis not present

## 2024-01-06 DIAGNOSIS — R410 Disorientation, unspecified: Secondary | ICD-10-CM | POA: Diagnosis not present

## 2024-01-06 DIAGNOSIS — D701 Agranulocytosis secondary to cancer chemotherapy: Secondary | ICD-10-CM | POA: Diagnosis not present

## 2024-01-06 DIAGNOSIS — Z0389 Encounter for observation for other suspected diseases and conditions ruled out: Secondary | ICD-10-CM | POA: Diagnosis not present

## 2024-01-06 DIAGNOSIS — C3490 Malignant neoplasm of unspecified part of unspecified bronchus or lung: Secondary | ICD-10-CM | POA: Diagnosis not present

## 2024-01-06 DIAGNOSIS — M47816 Spondylosis without myelopathy or radiculopathy, lumbar region: Secondary | ICD-10-CM | POA: Diagnosis not present

## 2024-01-06 DIAGNOSIS — C3480 Malignant neoplasm of overlapping sites of unspecified bronchus and lung: Secondary | ICD-10-CM | POA: Diagnosis not present

## 2024-01-06 DIAGNOSIS — E878 Other disorders of electrolyte and fluid balance, not elsewhere classified: Secondary | ICD-10-CM | POA: Diagnosis not present

## 2024-01-06 DIAGNOSIS — T451X5A Adverse effect of antineoplastic and immunosuppressive drugs, initial encounter: Secondary | ICD-10-CM | POA: Diagnosis not present

## 2024-01-06 DIAGNOSIS — E876 Hypokalemia: Secondary | ICD-10-CM | POA: Diagnosis not present

## 2024-01-06 DIAGNOSIS — D61818 Other pancytopenia: Secondary | ICD-10-CM | POA: Diagnosis not present

## 2024-01-06 DIAGNOSIS — A0472 Enterocolitis due to Clostridium difficile, not specified as recurrent: Secondary | ICD-10-CM | POA: Diagnosis not present

## 2024-01-06 DIAGNOSIS — M48061 Spinal stenosis, lumbar region without neurogenic claudication: Secondary | ICD-10-CM | POA: Diagnosis not present

## 2024-01-06 DIAGNOSIS — G893 Neoplasm related pain (acute) (chronic): Secondary | ICD-10-CM | POA: Diagnosis not present

## 2024-01-06 DIAGNOSIS — D630 Anemia in neoplastic disease: Secondary | ICD-10-CM | POA: Diagnosis not present

## 2024-01-06 DIAGNOSIS — C787 Secondary malignant neoplasm of liver and intrahepatic bile duct: Secondary | ICD-10-CM | POA: Diagnosis not present

## 2024-01-06 DIAGNOSIS — R000 Tachycardia, unspecified: Secondary | ICD-10-CM | POA: Diagnosis not present

## 2024-01-06 DIAGNOSIS — I10 Essential (primary) hypertension: Secondary | ICD-10-CM | POA: Diagnosis not present

## 2024-01-06 DIAGNOSIS — Z6822 Body mass index (BMI) 22.0-22.9, adult: Secondary | ICD-10-CM | POA: Diagnosis not present

## 2024-01-06 DIAGNOSIS — N179 Acute kidney failure, unspecified: Secondary | ICD-10-CM | POA: Diagnosis not present

## 2024-01-06 DIAGNOSIS — E43 Unspecified severe protein-calorie malnutrition: Secondary | ICD-10-CM | POA: Diagnosis not present

## 2024-01-06 DIAGNOSIS — C719 Malignant neoplasm of brain, unspecified: Secondary | ICD-10-CM | POA: Diagnosis not present

## 2024-01-06 NOTE — Telephone Encounter (Signed)
 Unable to reach patient after multiple attempts.  Patient did not return application

## 2024-01-08 ENCOUNTER — Other Ambulatory Visit: Payer: Self-pay

## 2024-01-08 DIAGNOSIS — E878 Other disorders of electrolyte and fluid balance, not elsewhere classified: Secondary | ICD-10-CM | POA: Diagnosis not present

## 2024-01-08 DIAGNOSIS — N179 Acute kidney failure, unspecified: Secondary | ICD-10-CM | POA: Diagnosis not present

## 2024-01-08 DIAGNOSIS — R7989 Other specified abnormal findings of blood chemistry: Secondary | ICD-10-CM | POA: Diagnosis not present

## 2024-01-08 DIAGNOSIS — R531 Weakness: Secondary | ICD-10-CM | POA: Diagnosis not present

## 2024-01-08 DIAGNOSIS — E871 Hypo-osmolality and hyponatremia: Secondary | ICD-10-CM | POA: Diagnosis not present

## 2024-01-08 DIAGNOSIS — E876 Hypokalemia: Secondary | ICD-10-CM | POA: Diagnosis not present

## 2024-01-08 DIAGNOSIS — C719 Malignant neoplasm of brain, unspecified: Secondary | ICD-10-CM | POA: Diagnosis not present

## 2024-01-08 DIAGNOSIS — I4891 Unspecified atrial fibrillation: Secondary | ICD-10-CM | POA: Diagnosis not present

## 2024-01-08 DIAGNOSIS — I10 Essential (primary) hypertension: Secondary | ICD-10-CM | POA: Diagnosis not present

## 2024-01-08 DIAGNOSIS — C3411 Malignant neoplasm of upper lobe, right bronchus or lung: Secondary | ICD-10-CM

## 2024-01-08 DIAGNOSIS — R410 Disorientation, unspecified: Secondary | ICD-10-CM | POA: Diagnosis not present

## 2024-01-08 DIAGNOSIS — D696 Thrombocytopenia, unspecified: Secondary | ICD-10-CM | POA: Diagnosis not present

## 2024-01-08 DIAGNOSIS — D701 Agranulocytosis secondary to cancer chemotherapy: Secondary | ICD-10-CM | POA: Diagnosis not present

## 2024-01-08 DIAGNOSIS — E119 Type 2 diabetes mellitus without complications: Secondary | ICD-10-CM | POA: Diagnosis not present

## 2024-01-08 DIAGNOSIS — R251 Tremor, unspecified: Secondary | ICD-10-CM | POA: Diagnosis not present

## 2024-01-08 DIAGNOSIS — C3490 Malignant neoplasm of unspecified part of unspecified bronchus or lung: Secondary | ICD-10-CM | POA: Diagnosis not present

## 2024-01-08 DIAGNOSIS — T451X5A Adverse effect of antineoplastic and immunosuppressive drugs, initial encounter: Secondary | ICD-10-CM | POA: Diagnosis not present

## 2024-01-09 ENCOUNTER — Inpatient Hospital Stay: Admitting: Internal Medicine

## 2024-01-09 ENCOUNTER — Inpatient Hospital Stay

## 2024-01-09 ENCOUNTER — Telehealth: Payer: Self-pay

## 2024-01-09 ENCOUNTER — Other Ambulatory Visit: Payer: Self-pay | Admitting: Medical Oncology

## 2024-01-09 DIAGNOSIS — G893 Neoplasm related pain (acute) (chronic): Secondary | ICD-10-CM | POA: Diagnosis not present

## 2024-01-09 DIAGNOSIS — N189 Chronic kidney disease, unspecified: Secondary | ICD-10-CM | POA: Diagnosis not present

## 2024-01-09 DIAGNOSIS — D61818 Other pancytopenia: Secondary | ICD-10-CM | POA: Diagnosis not present

## 2024-01-09 DIAGNOSIS — G9340 Encephalopathy, unspecified: Secondary | ICD-10-CM | POA: Diagnosis not present

## 2024-01-09 DIAGNOSIS — D709 Neutropenia, unspecified: Secondary | ICD-10-CM | POA: Diagnosis not present

## 2024-01-09 DIAGNOSIS — K754 Autoimmune hepatitis: Secondary | ICD-10-CM | POA: Diagnosis not present

## 2024-01-09 DIAGNOSIS — R5081 Fever presenting with conditions classified elsewhere: Secondary | ICD-10-CM | POA: Diagnosis not present

## 2024-01-09 DIAGNOSIS — M899 Disorder of bone, unspecified: Secondary | ICD-10-CM | POA: Diagnosis not present

## 2024-01-09 DIAGNOSIS — D696 Thrombocytopenia, unspecified: Secondary | ICD-10-CM

## 2024-01-09 DIAGNOSIS — I4891 Unspecified atrial fibrillation: Secondary | ICD-10-CM | POA: Diagnosis not present

## 2024-01-09 NOTE — Transitions of Care (Post Inpatient/ED Visit) (Signed)
   01/09/2024  Name: Kathryn Walsh MRN: [id number] DOB: Oct 08, 1956  Today's TOC FU Call Status: Today's TOC FU Call Status:: Unsuccessful Call (1st Attempt) Unsuccessful Call (1st Attempt) Date: 01/09/24  Attempted to reach the patient regarding the most recent Inpatient/ED visit.  Follow Up Plan: Additional outreach attempts will be made to reach the patient to complete the Transitions of Care (Post Inpatient/ED visit) call.   Signature Karena Addison, LPN [facility] Nurse Health Advisor Direct Dial [phone]

## 2024-01-10 DIAGNOSIS — I4891 Unspecified atrial fibrillation: Secondary | ICD-10-CM | POA: Diagnosis not present

## 2024-01-10 DIAGNOSIS — M47816 Spondylosis without myelopathy or radiculopathy, lumbar region: Secondary | ICD-10-CM | POA: Diagnosis not present

## 2024-01-10 DIAGNOSIS — K754 Autoimmune hepatitis: Secondary | ICD-10-CM | POA: Diagnosis not present

## 2024-01-10 DIAGNOSIS — M48061 Spinal stenosis, lumbar region without neurogenic claudication: Secondary | ICD-10-CM | POA: Diagnosis not present

## 2024-01-10 DIAGNOSIS — M4802 Spinal stenosis, cervical region: Secondary | ICD-10-CM | POA: Diagnosis not present

## 2024-01-10 DIAGNOSIS — D709 Neutropenia, unspecified: Secondary | ICD-10-CM | POA: Diagnosis not present

## 2024-01-10 DIAGNOSIS — R5081 Fever presenting with conditions classified elsewhere: Secondary | ICD-10-CM | POA: Diagnosis not present

## 2024-01-10 DIAGNOSIS — G893 Neoplasm related pain (acute) (chronic): Secondary | ICD-10-CM | POA: Diagnosis not present

## 2024-01-11 DIAGNOSIS — K754 Autoimmune hepatitis: Secondary | ICD-10-CM | POA: Diagnosis not present

## 2024-01-11 DIAGNOSIS — D709 Neutropenia, unspecified: Secondary | ICD-10-CM | POA: Diagnosis not present

## 2024-01-11 DIAGNOSIS — G893 Neoplasm related pain (acute) (chronic): Secondary | ICD-10-CM | POA: Diagnosis not present

## 2024-01-11 DIAGNOSIS — I4891 Unspecified atrial fibrillation: Secondary | ICD-10-CM | POA: Diagnosis not present

## 2024-01-11 DIAGNOSIS — R5081 Fever presenting with conditions classified elsewhere: Secondary | ICD-10-CM | POA: Diagnosis not present

## 2024-01-12 DIAGNOSIS — D709 Neutropenia, unspecified: Secondary | ICD-10-CM | POA: Diagnosis not present

## 2024-01-12 DIAGNOSIS — K754 Autoimmune hepatitis: Secondary | ICD-10-CM | POA: Diagnosis not present

## 2024-01-12 DIAGNOSIS — G893 Neoplasm related pain (acute) (chronic): Secondary | ICD-10-CM | POA: Diagnosis not present

## 2024-01-12 DIAGNOSIS — I4891 Unspecified atrial fibrillation: Secondary | ICD-10-CM | POA: Diagnosis not present

## 2024-01-12 DIAGNOSIS — R5081 Fever presenting with conditions classified elsewhere: Secondary | ICD-10-CM | POA: Diagnosis not present

## 2024-01-13 DIAGNOSIS — K754 Autoimmune hepatitis: Secondary | ICD-10-CM | POA: Diagnosis not present

## 2024-01-13 DIAGNOSIS — Z452 Encounter for adjustment and management of vascular access device: Secondary | ICD-10-CM | POA: Diagnosis not present

## 2024-01-13 DIAGNOSIS — I4891 Unspecified atrial fibrillation: Secondary | ICD-10-CM | POA: Diagnosis not present

## 2024-01-13 DIAGNOSIS — Z0389 Encounter for observation for other suspected diseases and conditions ruled out: Secondary | ICD-10-CM | POA: Diagnosis not present

## 2024-01-13 DIAGNOSIS — T8649 Other complications of liver transplant: Secondary | ICD-10-CM | POA: Diagnosis not present

## 2024-01-13 DIAGNOSIS — K7400 Hepatic fibrosis, unspecified: Secondary | ICD-10-CM | POA: Diagnosis not present

## 2024-01-13 DIAGNOSIS — D849 Immunodeficiency, unspecified: Secondary | ICD-10-CM | POA: Diagnosis not present

## 2024-01-13 DIAGNOSIS — D709 Neutropenia, unspecified: Secondary | ICD-10-CM | POA: Diagnosis not present

## 2024-01-13 DIAGNOSIS — Z944 Liver transplant status: Secondary | ICD-10-CM | POA: Diagnosis not present

## 2024-01-13 DIAGNOSIS — G893 Neoplasm related pain (acute) (chronic): Secondary | ICD-10-CM | POA: Diagnosis not present

## 2024-01-13 DIAGNOSIS — R5081 Fever presenting with conditions classified elsewhere: Secondary | ICD-10-CM | POA: Diagnosis not present

## 2024-01-14 DIAGNOSIS — K754 Autoimmune hepatitis: Secondary | ICD-10-CM | POA: Diagnosis not present

## 2024-01-14 DIAGNOSIS — R000 Tachycardia, unspecified: Secondary | ICD-10-CM | POA: Diagnosis not present

## 2024-01-14 DIAGNOSIS — I4891 Unspecified atrial fibrillation: Secondary | ICD-10-CM | POA: Diagnosis not present

## 2024-01-14 NOTE — Transitions of Care (Post Inpatient/ED Visit) (Signed)
   01/14/2024  Name: Grete Bosko MRN: [id number] DOB: [birth year]  Today's TOC FU Call Status: Today's TOC FU Call Status:: Unsuccessful Call (3rd Attempt) Unsuccessful Call (1st Attempt) Date: 01/09/24 Unsuccessful Call (2nd Attempt) Date: 01/14/24 Unsuccessful Call (3rd Attempt) Date: 01/14/24  Attempted to reach the patient regarding the most recent Inpatient/ED visit.  Follow Up Plan: No further outreach attempts will be made at this time. We have been unable to contact the patient.  Signature Karena Addison, LPN [facility] Nurse Health Advisor Direct Dial [phone]

## 2024-01-14 NOTE — Transitions of Care (Post Inpatient/ED Visit) (Signed)
   01/14/2024  Name: Kathryn Walsh MRN: [id number] DOB: 1956/08/27  Today's TOC FU Call Status: Today's TOC FU Call Status:: Unsuccessful Call (2nd Attempt) Unsuccessful Call (1st Attempt) Date: 01/09/24 Unsuccessful Call (2nd Attempt) Date: 01/14/24  Attempted to reach the patient regarding the most recent Inpatient/ED visit.  Follow Up Plan: Additional outreach attempts will be made to reach the patient to complete the Transitions of Care (Post Inpatient/ED visit) call.   Signature Karena Addison, LPN [facility] Nurse Health Advisor Direct Dial [phone]

## 2024-01-15 DIAGNOSIS — K754 Autoimmune hepatitis: Secondary | ICD-10-CM | POA: Diagnosis not present

## 2024-01-15 DIAGNOSIS — R000 Tachycardia, unspecified: Secondary | ICD-10-CM | POA: Diagnosis not present

## 2024-01-15 DIAGNOSIS — I4891 Unspecified atrial fibrillation: Secondary | ICD-10-CM | POA: Diagnosis not present

## 2024-01-16 DIAGNOSIS — I4891 Unspecified atrial fibrillation: Secondary | ICD-10-CM | POA: Diagnosis not present

## 2024-01-16 DIAGNOSIS — K754 Autoimmune hepatitis: Secondary | ICD-10-CM | POA: Diagnosis not present

## 2024-01-16 DIAGNOSIS — R000 Tachycardia, unspecified: Secondary | ICD-10-CM | POA: Diagnosis not present

## 2024-01-17 DIAGNOSIS — K754 Autoimmune hepatitis: Secondary | ICD-10-CM | POA: Diagnosis not present

## 2024-01-17 DIAGNOSIS — I4891 Unspecified atrial fibrillation: Secondary | ICD-10-CM | POA: Diagnosis not present

## 2024-01-17 DIAGNOSIS — R000 Tachycardia, unspecified: Secondary | ICD-10-CM | POA: Diagnosis not present

## 2024-01-20 ENCOUNTER — Telehealth: Payer: Self-pay

## 2024-01-20 NOTE — Transitions of Care (Post Inpatient/ED Visit) (Signed)
 01/20/2024  Name: Kathryn Walsh MRN: [id number] DOB: 1956-02-12  Today's TOC FU Call Status: Today's TOC FU Call Status:: Successful TOC FU Call Completed TOC FU Call Complete Date: 01/20/24 Patient's Name and Date of Birth confirmed.  Transition Care Management Follow-up Telephone Call Date of Discharge: 01/17/24 Discharge Facility[facility]) [facility]: [facility] Type of Discharge: Inpatient Admission Primary Inpatient Discharge Diagnosis:: Chemo induced neutropenia & thrombocytopenia, Encephalopathy, Atrial fibrillation with RVR How have you been since you were released from the hospital?: Better Any questions or concerns?: No  Items Reviewed: Did you receive and understand the discharge instructions provided?: Yes Medications obtained,verified, and reconciled?: Yes (Medications Reviewed) Any new allergies since your discharge?: No Dietary orders reviewed?: Yes Type of Diet Ordered:: Diet ordered "Regular" Do you have support at home?: Yes People in Home: sibling(s), other relative(s) Name of Support/Comfort Primary Source: my family  Medications Reviewed Today: Medications Reviewed Today     Reviewed by Marcos Eke, RN (Registered Nurse) on 01/20/24 at 1448  Med List Status: <None>   Medication Order Taking? Sig Documenting Provider Last Dose Status Informant  amLODipine (NORVASC) 5 MG tablet [id number]  TAKE 1 TABLET BY MOUTH TWICE A Sharyon Medicus, MD  Active   azaTHIOprine (IMURAN) 50 MG tablet [id number] No Take 100 mg by mouth 2 (two) times daily.  [provider] Taking Active Self  Blood Glucose Monitoring Suppl (ACCU-CHEK GUIDE ME) w/Device KIT [id number] No Use to check blood sugar 3 times a day. Dx code e11.65 Dorothyann Peng, MD Taking Active   Cholecalciferol 25 MCG (1000 UT) capsule [id number] No Take 4,000 Units by mouth daily. [provider] Taking Active   cyclobenzaprine  (FLEXERIL) 10 MG tablet [id number] No TAKE 1 TABLET BY MOUTH EVERYDAY AT BEDTIME  Patient not taking: Reported on 11/07/2023   Charlesetta Ivory, NP Not Taking Active   dapagliflozin propanediol (FARXIGA) 10 MG TABS tablet [id number] No Take 1 tablet (10 mg total) by mouth daily before breakfast. Dorothyann Peng, MD Taking Active   glucose blood (ACCU-CHEK GUIDE) test strip [id number] No Use as instructed to check blood sugar 3 times a day. Dx code e11.65 Dorothyann Peng, MD Taking Active   insulin degludec (TRESIBA FLEXTOUCH) 200 UNIT/ML FlexTouch Pen [id number] No Inject 25 units nightly Dorothyann Peng, MD Taking Active   insulin lispro (HUMALOG KWIKPEN) 200 UNIT/ML KwikPen [id number] No Inject 6 Units into the skin 2 (two) times daily before a meal. Please inject 6 units before breakfast and dinner; max dose titration 40 units daily Dorothyann Peng, MD Taking Active   losartan (COZAAR) 25 MG tablet [id number] No Take 25 mg by mouth daily. [provider] Taking Active   pravastatin (PRAVACHOL) 40 MG tablet [id number]  TAKE 1 TABLET BY MOUTH EVERYDAY AT BEDTIME Dorothyann Peng, MD  Active   tacrolimus (PROGRAF) 1 MG capsule [id number] No Take 2 mg in am and 3 mg in pm [provider] Taking Active Self  TURMERIC PO [id number] No Take by mouth.  Patient not taking: Reported on 10/02/2023   [provider] Not Taking Active Self            Home Care and Equipment/Supplies: Were Home Health Services Ordered?: No Any new equipment or medical supplies ordered?: No  Functional Questionnaire: Do you need assistance with bathing/showering or dressing?: No Do you need assistance with meal preparation?: No Do you need assistance with eating?: No Do you have difficulty maintaining continence: No  Do you need assistance with getting out of bed/getting out of a chair/moving?: No Do you have difficulty managing or taking your medications?: No  Follow up appointments reviewed: PCP  Follow-up appointment confirmed?: No (Urged patient to call her PCP, Dr. Dorothyann Peng and request appointment sooner than her scheduled 6 month follow up already set up, patient stated she wanted to call PCP herself for hospital follow up appointment) MD Provider Line Number:[phone] Given: No Specialist Hospital Follow-up appointment confirmed?: Yes Follow-Up Specialty Provider:: Patient has several Specialist follow ups incluing Cardiology and Oncology, as well as lab follow ups Do you need transportation to your follow-up appointment?: No Do you understand care options if your condition(s) worsen?: Yes-patient verbalized understanding   Philipe Laswell A. Mliss Fritz RN, BA, [facility], CRRN [facility] RN Care Manager, Transition of Care [phone]

## 2024-01-22 NOTE — Progress Notes (Signed)
 I called the pt today to see if she was still wanting to get her care here in [place]. The pt was recently hospitalized at [facility] for neutropenia and I wanted to ensure she still wanted to come here for treatment. Pt stated that she does want to get her care here in [place]. Pt's daughter was on speaker phone during the conversation. I told them both I would look at Dr Pollie Friar schedule to see when we could get her in. Pts daughter states that she is the main provider of transportation and she is due to have a baby any day now, so it will depend on when the appt is whether or not the pt will be able to come. I told the pt I would ask our scheduler to call her.

## 2024-01-24 DIAGNOSIS — D630 Anemia in neoplastic disease: Secondary | ICD-10-CM | POA: Diagnosis not present

## 2024-01-24 DIAGNOSIS — I4891 Unspecified atrial fibrillation: Secondary | ICD-10-CM | POA: Diagnosis not present

## 2024-01-24 DIAGNOSIS — C3480 Malignant neoplasm of overlapping sites of unspecified bronchus and lung: Secondary | ICD-10-CM | POA: Diagnosis not present

## 2024-02-04 ENCOUNTER — Other Ambulatory Visit: Payer: Self-pay | Admitting: Medical Oncology

## 2024-02-04 ENCOUNTER — Encounter: Payer: Self-pay | Admitting: Internal Medicine

## 2024-02-04 DIAGNOSIS — C3411 Malignant neoplasm of upper lobe, right bronchus or lung: Secondary | ICD-10-CM

## 2024-02-10 ENCOUNTER — Ambulatory Visit
Admission: RE | Admit: 2024-02-10 | Discharge: 2024-02-10 | Disposition: A | Discharge status: Home / Self Care | Payer: Self-pay | Source: Ambulatory Visit | Attending: Internal Medicine | Admitting: Internal Medicine

## 2024-02-10 ENCOUNTER — Inpatient Hospital Stay: Attending: Internal Medicine

## 2024-02-10 ENCOUNTER — Inpatient Hospital Stay (HOSPITAL_BASED_OUTPATIENT_CLINIC_OR_DEPARTMENT_OTHER): Admitting: Internal Medicine

## 2024-02-10 ENCOUNTER — Other Ambulatory Visit: Payer: Self-pay

## 2024-02-10 VITALS — BP 125/66 | HR 79 | Temp 97.9°F | Resp 16 | Ht 67.0 in | Wt 147.0 lb

## 2024-02-10 DIAGNOSIS — Z79624 Long term (current) use of inhibitors of nucleotide synthesis: Secondary | ICD-10-CM | POA: Diagnosis not present

## 2024-02-10 DIAGNOSIS — C3411 Malignant neoplasm of upper lobe, right bronchus or lung: Secondary | ICD-10-CM

## 2024-02-10 DIAGNOSIS — Z5112 Encounter for antineoplastic immunotherapy: Secondary | ICD-10-CM | POA: Insufficient documentation

## 2024-02-10 DIAGNOSIS — Z87891 Personal history of nicotine dependence: Secondary | ICD-10-CM

## 2024-02-10 DIAGNOSIS — C787 Secondary malignant neoplasm of liver and intrahepatic bile duct: Secondary | ICD-10-CM | POA: Diagnosis not present

## 2024-02-10 DIAGNOSIS — Z5111 Encounter for antineoplastic chemotherapy: Secondary | ICD-10-CM | POA: Diagnosis not present

## 2024-02-10 DIAGNOSIS — Z79899 Other long term (current) drug therapy: Secondary | ICD-10-CM | POA: Insufficient documentation

## 2024-02-10 DIAGNOSIS — C7951 Secondary malignant neoplasm of bone: Secondary | ICD-10-CM | POA: Diagnosis not present

## 2024-02-10 DIAGNOSIS — E119 Type 2 diabetes mellitus without complications: Secondary | ICD-10-CM | POA: Diagnosis not present

## 2024-02-10 DIAGNOSIS — D696 Thrombocytopenia, unspecified: Secondary | ICD-10-CM | POA: Insufficient documentation

## 2024-02-10 DIAGNOSIS — Z79621 Long term (current) use of calcineurin inhibitor: Secondary | ICD-10-CM | POA: Diagnosis not present

## 2024-02-10 DIAGNOSIS — D649 Anemia, unspecified: Secondary | ICD-10-CM | POA: Diagnosis not present

## 2024-02-10 DIAGNOSIS — Z79631 Long term (current) use of antimetabolite agent: Secondary | ICD-10-CM | POA: Diagnosis not present

## 2024-02-10 DIAGNOSIS — I10 Essential (primary) hypertension: Secondary | ICD-10-CM | POA: Diagnosis not present

## 2024-02-10 DIAGNOSIS — C3491 Malignant neoplasm of unspecified part of right bronchus or lung: Secondary | ICD-10-CM | POA: Insufficient documentation

## 2024-02-10 LAB — CBC WITH DIFFERENTIAL (CANCER CENTER ONLY)
Abs Immature Granulocytes: 0.06 10*3/uL (ref 0.00–0.07)
Basophils Absolute: 0.1 10*3/uL (ref 0.0–0.1)
Basophils Relative: 1 %
Eosinophils Absolute: 0.1 10*3/uL (ref 0.0–0.5)
Eosinophils Relative: 1 %
HCT: 26.3 % — ABNORMAL LOW (ref 36.0–46.0)
Hemoglobin: 8.3 g/dL — ABNORMAL LOW (ref 12.0–15.0)
Immature Granulocytes: 1 %
Lymphocytes Relative: 9 %
Lymphs Abs: 0.7 10*3/uL (ref 0.7–4.0)
MCH: 27.4 pg (ref 26.0–34.0)
MCHC: 31.6 g/dL (ref 30.0–36.0)
MCV: 86.8 fL (ref 80.0–100.0)
Monocytes Absolute: 0.8 10*3/uL (ref 0.1–1.0)
Monocytes Relative: 9 %
Neutro Abs: 6.5 10*3/uL (ref 1.7–7.7)
Neutrophils Relative %: 79 %
Platelet Count: 154 10*3/uL (ref 150–400)
RBC: 3.03 MIL/uL — ABNORMAL LOW (ref 3.87–5.11)
RDW: 16.1 % — ABNORMAL HIGH (ref 11.5–15.5)
WBC Count: 8.2 10*3/uL (ref 4.0–10.5)
nRBC: 0 % (ref 0.0–0.2)

## 2024-02-10 LAB — CMP (CANCER CENTER ONLY)
ALT: 31 U/L (ref 0–44)
AST: 41 U/L (ref 15–41)
Albumin: 3.1 g/dL — ABNORMAL LOW (ref 3.5–5.0)
Alkaline Phosphatase: 488 U/L — ABNORMAL HIGH (ref 38–126)
Anion gap: 8 (ref 5–15)
BUN: 22 mg/dL (ref 8–23)
CO2: 21 mmol/L — ABNORMAL LOW (ref 22–32)
Calcium: 8.8 mg/dL — ABNORMAL LOW (ref 8.9–10.3)
Chloride: 105 mmol/L (ref 98–111)
Creatinine: 1.65 mg/dL — ABNORMAL HIGH (ref 0.44–1.00)
GFR, Estimated: 34 mL/min — ABNORMAL LOW (ref >60)
Glucose, Bld: 285 mg/dL — ABNORMAL HIGH (ref 70–99)
Potassium: 5 mmol/L (ref 3.5–5.1)
Sodium: 134 mmol/L — ABNORMAL LOW (ref 135–145)
Total Bilirubin: 1.1 mg/dL (ref 0.0–1.2)
Total Protein: 8.4 g/dL — ABNORMAL HIGH (ref 6.5–8.1)

## 2024-02-10 LAB — MAGNESIUM: Magnesium: 1.6 mg/dL — ABNORMAL LOW (ref 1.7–2.4)

## 2024-02-10 MED ORDER — PROCHLORPERAZINE MALEATE 10 MG PO TABS: 10.0000 mg | ORAL_TABLET | Freq: Four times a day (QID) | ORAL | 1 refills | Status: DC | PRN

## 2024-02-10 MED ORDER — LIDOCAINE-PRILOCAINE 2.5-2.5 % EX CREA: TOPICAL_CREAM | CUTANEOUS | 3 refills | Status: DC

## 2024-02-10 MED ORDER — ONDANSETRON HCL 8 MG PO TABS
8.0000 mg | ORAL_TABLET | Freq: Three times a day (TID) | ORAL | 1 refills | Status: DC | PRN
Start: 2024-02-10 — End: 2024-03-10

## 2024-02-10 NOTE — Progress Notes (Signed)
 START ON PATHWAY REGIMEN - Small Cell Lung     A cycle is every 21 days:     Carboplatin      Etoposide   **Always confirm dose/schedule in your pharmacy ordering system**  Patient Characteristics: Newly Diagnosed, Preoperative or Nonsurgical Candidate (Clinical Staging), First Line, Extensive Stage Therapeutic Status: Newly Diagnosed, Preoperative or Nonsurgical Candidate (Clinical Staging) AJCC T Category: cT4 AJCC N Category: cN2b AJCC M Category: cM1c2 AJCC 9 Stage Grouping: IVB Check here if patient was staged using an edition other than AJCC Staging 9th Edition: false Stage Classification: Extensive Intent of Therapy: Non-Curative / Palliative Intent, Discussed with Patient

## 2024-02-10 NOTE — Progress Notes (Signed)
 [facility] [phone]   [phone]  CONSULT NOTE  REFERRING PHYSICIAN: Dr. Nyra Jabs  REASON FOR CONSULTATION:  68 years old African-American female recently diagnosed with lung cancer  HPI Kathryn Walsh is a 68 y.o. female.   Discussed the use of AI scribe software for clinical note transcription with the patient, who gave verbal consent to proceed.  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 She is accompanied by her sister, Juliette Alcide.  Initially, she sought medical attention due to swelling in the liver area and sharp pains around the ribcage, which led her to visit [facility]. A CT scan on December 21, 2023, revealed a right upper lobe mass with bulky mediastinal lymph nodes and liver enlargement. An MRI of the abdomen showed numerous liver metastases, while an MRI of the brain showed no spread to the brain but indicated bone metastases. A bronchoscopy and biopsy confirmed small cell lung cancer in the 4R lymph node.  She underwent her first round of chemotherapy with carboplatin and etoposide, which resulted in significant side effects, including hair loss and delirium. Following chemotherapy, she developed a Clostridioides difficile infection, leading to hospitalization at [facility], where she was treated with antibiotics such as Flagyl and vancomycin. She has since completed the antibiotic course and is currently in a quarantine period at home until Mar 05, 2024, due to her daughter's recent childbirth.  She experiences ongoing symptoms of difficulty breathing and pain under the ribcage, though the pain is not as severe as before. No nausea, vomiting, or diarrhea at present. She has experienced significant weight loss, dropping from 167 pounds to 147 pounds.  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 She is on tacrolimus for her liver transplant and medications for diabetes.  She has a history of smoking for over 50 years, having quit recently, and consumes alcohol casually. There is no family history of cancer, though her mother had congestive heart failure.       Past Medical History:  Diagnosis Date   Diabetes mellitus without complication (HCC)    High cholesterol    HTN (hypertension)    Liver transplant recipient [facility])     Past Surgical History:  Procedure Laterality Date   LIVER TRANSPLANTATION      Family History  Problem Relation Age of Onset   Healthy Mother    Healthy Father    Heart disease Sister    Heart disease Brother    Gout Brother    Heart disease Brother    Healthy Daughter    Diabetes Son     Social History Social History   Tobacco Use   Smoking status: Former    Current packs/day: 0.75    Average packs/day: 0.8 packs/day for 49.2 years (36.9 ttl pk-yrs)    Types: Cigarettes    Start date: 11/12/1974   Smokeless tobacco: Never   Tobacco comments:    encouraged to avoid smoking in the house and in her car  Vaping Use   Vaping status: Never Used  Substance Use Topics   Alcohol use: No   Drug use: No    No Known Allergies  Current Outpatient Medications  Medication Sig Dispense Refill   amLODipine (NORVASC) 5 MG tablet TAKE 1 TABLET BY MOUTH TWICE A  DAY 180 tablet 1   azaTHIOprine (IMURAN) 50 MG tablet Take 100 mg by mouth 2 (two) times daily.      Blood Glucose Monitoring Suppl (ACCU-CHEK GUIDE ME) w/Device KIT Use to check blood sugar 3 times a day. Dx code e11.65 1 kit 3   Cholecalciferol 25 MCG (1000 UT) capsule Take 4,000 Units by mouth daily.     cyclobenzaprine (FLEXERIL) 10 MG tablet TAKE 1 TABLET BY MOUTH EVERYDAY AT BEDTIME (Patient not taking: Reported on 11/07/2023) 30 tablet 0   dapagliflozin propanediol (FARXIGA) 10 MG TABS tablet  Take 1 tablet (10 mg total) by mouth daily before breakfast. 30 tablet 5   glucose blood (ACCU-CHEK GUIDE) test strip Use as instructed to check blood sugar 3 times a day. Dx code e11.65 100 each 12   insulin degludec (TRESIBA FLEXTOUCH) 200 UNIT/ML FlexTouch Pen Inject 25 units nightly 9 mL 3   insulin lispro (HUMALOG KWIKPEN) 200 UNIT/ML KwikPen Inject 6 Units into the skin 2 (two) times daily before a meal. Please inject 6 units before breakfast and dinner; max dose titration 40 units daily 5 mL 1   losartan (COZAAR) 25 MG tablet Take 25 mg by mouth daily.     pravastatin (PRAVACHOL) 40 MG tablet TAKE 1 TABLET BY MOUTH EVERYDAY AT BEDTIME 90 tablet 1   tacrolimus (PROGRAF) 1 MG capsule Take 2 mg in am and 3 mg in pm     TURMERIC PO Take by mouth. (Patient not taking: Reported on 10/02/2023)     No current facility-administered medications for this visit.    Review of Systems  Constitutional: positive for anorexia, fatigue, and weight loss Eyes: negative Ears, nose, mouth, throat, and face: negative Respiratory: positive for cough and dyspnea on exertion Cardiovascular: negative Gastrointestinal: negative Genitourinary:negative Integument/breast: negative Hematologic/lymphatic: negative Musculoskeletal:negative Neurological: negative Behavioral/Psych: negative Endocrine: negative Allergic/Immunologic: negative  Physical Exam  OZD:GUYQI, healthy, no distress, well nourished, and well developed SKIN: skin color, texture, turgor are normal, no rashes or significant lesions HEAD: Normocephalic, No masses, lesions, tenderness or abnormalities EYES: normal, PERRLA, Conjunctiva are pink and non-injected EARS: External ears normal, Canals clear OROPHARYNX:no exudate, no erythema, and lips, buccal mucosa, and tongue normal  NECK: supple, no adenopathy, no JVD LYMPH:  no palpable lymphadenopathy, no hepatosplenomegaly BREAST:not examined LUNGS: clear to auscultation , and  palpation HEART: regular rate & rhythm, no murmurs, and no gallops ABDOMEN:abdomen soft, non-tender, normal bowel sounds, and no masses or organomegaly BACK: Back symmetric, no curvature., No CVA tenderness EXTREMITIES:no joint deformities, effusion, or inflammation, no edema  NEURO: alert & oriented x 3 with fluent speech, no focal motor/sensory deficits  PERFORMANCE STATUS: ECOG 1  LABORATORY DATA: Lab Results  Component Value Date   WBC 3.5 11/07/2023   HGB 11.1 11/07/2023   HCT 33.9 (L) 11/07/2023   MCV 95 11/07/2023   PLT 108 (L) 11/07/2023      Chemistry      Component Value Date/Time   NA 144 11/07/2023 1522   K 4.3 11/07/2023 1522   CL 108 (H) 11/07/2023 1522   CO2 21 11/07/2023 1522   BUN 23 11/07/2023 1522   CREATININE 1.27 (H) 11/07/2023 1522   CREATININE 0.83 09/07/2021 0940      Component Value Date/Time   CALCIUM 8.9 11/07/2023 1522   ALKPHOS 224 (H) 11/07/2023 1522   AST 24 11/07/2023 1522   ALT 17 11/07/2023 1522   BILITOT 0.3 11/07/2023 1522       RADIOGRAPHIC STUDIES: No  results found.  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  The patient received 1 cycle of systemic chemotherapy at [facility] but this was complicated with prolonged hospitalization and C. difficile infection.   PLAN: I had a lengthy discussion with the patient and her sister as well as her daughter who is available by phone during the visit. Assessment and Plan    Small Cell Lung Cancer with Metastasis Extensive stage small cell lung  cancer with a right upper lobe mass, bulky mediastinal lymph nodes, liver metastases, and bone metastases. No brain metastases. Previously received chemotherapy with carboplatin and etoposide, complicated by C. diff infection. The cancer is incurable; treatment aims to prolong life and improve quality of life. High risk for infections due to immunosuppression from prior liver transplant and chemotherapy. Proposed treatment includes resuming chemotherapy with reduced dose and administering Cosela to protect bone marrow. Chemotherapy may extend life by approximately 12 months on average, with 50% of patients potentially living longer. Alternatives include palliative care and hospice, but she opts to continue chemotherapy. - Resume chemotherapy with carboplatin AUC of 4 and etoposide 80 mg. - Administer Cosela prior to chemotherapy to protect bone marrow and aid blood count recovery. - Schedule chemotherapy for next week. - Provide antiemetics and topical cream for port site. - Order a port for chemotherapy administration. - Educate on chemotherapy side effects and management. - Discuss potential involvement of palliative care for symptom management post-chemotherapy.  C. diff Infection Developed C. diff infection post-chemotherapy, treated with Flagyl and vancomycin. Currently asymptomatic and completed antibiotic course.  Liver Transplant Status Liver transplant 26 years ago due to hepatitis C from a blood transfusion. On tacrolimus for immunosuppression. Liver enlarged with metastases from lung cancer.  Diabetes Mellitus Diabetes mellitus managed with medication.  Hypertension Hypertension managed with medication.   The patient will come back for follow-up visit in 2 weeks for reevaluation and management of any adverse effect of her treatment. She was advised to call immediately if she has any concerning symptoms in the interval. The patient voices understanding of current disease status and  treatment options and is in agreement with the current care plan.  All questions were answered. The patient knows to call the clinic with any problems, questions or concerns. We can certainly see the patient much sooner if necessary.  Thank you so much for allowing me to participate in the care of Jackilyn Fetty. I will continue to follow up the patient with you and assist in her care.  The total time spent in the appointment was 90 minutes.  Disclaimer: This note was dictated with voice recognition software. Similar sounding words can inadvertently be transcribed and may not be corrected upon review.   Aurelio Blower February 10, 2024, 1:34 PM

## 2024-02-11 ENCOUNTER — Telehealth: Payer: Self-pay

## 2024-02-11 ENCOUNTER — Other Ambulatory Visit: Payer: Self-pay

## 2024-02-11 ENCOUNTER — Other Ambulatory Visit: Payer: Self-pay | Admitting: Physician Assistant

## 2024-02-11 DIAGNOSIS — C3491 Malignant neoplasm of unspecified part of right bronchus or lung: Secondary | ICD-10-CM

## 2024-02-11 NOTE — Telephone Encounter (Signed)
 Spoke with patients daughter, Galen Judge this morning and she reports a missed call from our office. Informed her that scheduling was likely calling to go over appts.  Informed her of patients appts.  She voiced understanding.

## 2024-02-12 ENCOUNTER — Inpatient Hospital Stay: Admission: RE | Admit: 2024-02-12 | Source: Ambulatory Visit

## 2024-02-12 ENCOUNTER — Encounter: Payer: Self-pay | Admitting: Internal Medicine

## 2024-02-12 DIAGNOSIS — Z944 Liver transplant status: Secondary | ICD-10-CM | POA: Diagnosis not present

## 2024-02-12 DIAGNOSIS — R768 Other specified abnormal immunological findings in serum: Secondary | ICD-10-CM | POA: Diagnosis not present

## 2024-02-12 DIAGNOSIS — C787 Secondary malignant neoplasm of liver and intrahepatic bile duct: Secondary | ICD-10-CM | POA: Diagnosis not present

## 2024-02-12 DIAGNOSIS — Z5181 Encounter for therapeutic drug level monitoring: Secondary | ICD-10-CM | POA: Diagnosis not present

## 2024-02-12 DIAGNOSIS — C3490 Malignant neoplasm of unspecified part of unspecified bronchus or lung: Secondary | ICD-10-CM | POA: Diagnosis not present

## 2024-02-12 DIAGNOSIS — D849 Immunodeficiency, unspecified: Secondary | ICD-10-CM | POA: Diagnosis not present

## 2024-02-12 NOTE — Progress Notes (Signed)
 Met the pt for the first time on 4/14 at her consult with Dr. Marguerita Shih. Pt was accompanied by her sister, Kathryn Walsh. Plan for the pt is for her to start her C2 Carbo/Etop with Cosela added to her regimen. Pt will need a chemo education class prior to her treatment. Pt is interested in having a port for her treatment. Pt did have one while she was admitted to [facility] for her first treatment, but had it removed prior to discharge. I will assist the pt with the scheduling of the port, if needed. All questions were answered. I provided my card to the pt and her sister and encouraged her to call me with any questions. I escorted the pt to scheduling at the completion of the appt.

## 2024-02-13 NOTE — Progress Notes (Signed)
 Reminder call to [name] regarding port placement on 02/14/24. I spoke with [name], instructions given on arrival time, NPO status, we reviewed her allergies and medications, what to expect pre, intra, and post procedure, I advised her to wear comfortable clothing, she stated that her aunt will be accompanying her mother to her procedure, and answered her questions. Kathryn Walsh verbalized understanding.

## 2024-02-13 NOTE — Discharge Instructions (Signed)
 Implanted Port Insertion After Care   What can I expect after the procedure?   After the procedure, it is common to have:   Discomfort at the port insertion site.   Bruising on the skin over the port. This should improve over 3-4 days.    Port care   After your port is placed, you will get a manufacturer's information card. The card has information about your port. Keep this card with you at all times.   Take care of the port as told by your health care provider. Ask your health care provider if you or a family member can get training for taking care of the port at home.   Make sure to remember what type of port you have.       Incision care   Follow instructions from your health care provider about how to take care of your port insertion site. Make sure you:   Wash your hands with soap and water for at least 20 seconds before and after you change your bandage (dressing). If soap and water are not available, use hand sanitizer.        Leave your initial bandage on for a full 24 hours.     After 24 hours you may remove the dressing and shower.  Do not scrub directly on the incision site but rather above it and let the soapy water run over the incision.  Pat dry after.    You may then opt to redress your incision for your comfort but you may just leave it open to air.  The Dermabond (surgical super glue) will protect your incision and keep it clean and dry.    Leave the layer of skin glue in place. If the glue edges start to loosen and curl up, you may trim the loose edges but do not pull or pick at it.   Do NOT apply neosporin or other antibacterial ointment to the surgical glue.  It will dissolve the glue and expose your new incision to possible infection.     Do NOT apply EMLA numbing cream to the surgical glue.  It will dissolve the glue and expose your new incision to possible infection.  You may have to wait to use the EMLA cream until your incision has healed.    Check  your port insertion site every day for signs of infection. Check for:   Redness, swelling, or pain.   Fluid or blood.   Warmth.   Pus or a bad smell.      Activity   Return to your normal activities as told by your health care provider. Ask your health care provider what activities are safe for you.   You may have to avoid lifting. Ask your health care provider how much you can safely lift.   General instructions   Take over-the-counter and prescription medicines only as told by your health care provider.   Do not take baths, swim, or use a hot tub until your incision has healed completely (usually 2 weeks).    If you were given a sedative during the procedure, it can affect you for several hours. Do not drive, operate machinery or sign important documents for 24 hours after your procedure.   Keep all follow-up visits. This is important.   Please contact our office at [phone] for the following symptoms:   You have a fever or chills.   You have redness, swelling, or pain around your port insertion site.  You have fluid or blood coming from your port insertion site.   Your port insertion site feels warm to the touch.   You have pus or a bad smell coming from the port insertion site.   Get help right away if:   You have chest pain or shortness of breath.   You have bleeding from your port that you cannot control.   Do not wait to see if the symptoms will go away.   Do not drive yourself to the hospital.      These symptoms may be an emergency.    Get help right away. Call 911.     Summary   Take care of the port as told by your health care provider. Keep the manufacturer's information card with you at all times.   Keep your dressing on for 24 hours!  After that you can shower and redress the site only as needed.    No neosporin, antibiotic ointment or EMLA numbing cream on the glue over your incision   Do not submerge your incision under water in a  bath, pool or hot tube until fully healed   Contact a health care provider if you have a fever or chills or if you have redness, swelling, or pain around your port insertion site.   Keep all follow-up visits.   If you need to speak to someone after hours (5:00PM). Please contact the on-call IR MD at [phone]. Tell them you are a patient of Dr. Marne Sings, you had a port placed today and any issues you are experiencing.    Thank you for visiting [facility] today!

## 2024-02-14 ENCOUNTER — Ambulatory Visit
Admission: RE | Admit: 2024-02-14 | Discharge: 2024-02-14 | Disposition: A | Discharge status: Home / Self Care | Source: Ambulatory Visit | Attending: Internal Medicine | Admitting: Internal Medicine

## 2024-02-14 ENCOUNTER — Telehealth: Payer: Self-pay | Admitting: Medical Oncology

## 2024-02-14 ENCOUNTER — Inpatient Hospital Stay

## 2024-02-14 DIAGNOSIS — C3490 Malignant neoplasm of unspecified part of unspecified bronchus or lung: Secondary | ICD-10-CM | POA: Diagnosis not present

## 2024-02-14 DIAGNOSIS — Z452 Encounter for adjustment and management of vascular access device: Secondary | ICD-10-CM | POA: Diagnosis not present

## 2024-02-14 DIAGNOSIS — C3491 Malignant neoplasm of unspecified part of right bronchus or lung: Secondary | ICD-10-CM

## 2024-02-14 HISTORY — PX: IR IMAGING GUIDED PORT INSERTION: IMG5740

## 2024-02-14 MED ORDER — SODIUM CHLORIDE 0.9 % IV SOLN: INTRAVENOUS | Status: DC

## 2024-02-14 MED ORDER — LIDOCAINE-EPINEPHRINE 1 %-1:100000 IJ SOLN
20.0000 mL | Freq: Once | INTRAMUSCULAR | Status: AC
  Administered 2024-02-14: 20 mL via INTRADERMAL

## 2024-02-14 MED ORDER — MIDAZOLAM HCL 2 MG/2ML IJ SOLN
1.0000 mg | INTRAMUSCULAR | Status: DC | PRN
  Administered 2024-02-14: 1 mg via INTRAVENOUS

## 2024-02-14 MED ORDER — FENTANYL CITRATE PF 50 MCG/ML IJ SOSY
25.0000 ug | PREFILLED_SYRINGE | INTRAMUSCULAR | Status: DC | PRN
  Administered 2024-02-14: 50 ug via INTRAVENOUS

## 2024-02-14 MED ORDER — HEPARIN SOD (PORK) LOCK FLUSH 100 UNIT/ML IV SOLN
500.0000 [IU] | Freq: Once | INTRAVENOUS | Status: AC
  Administered 2024-02-14: 500 [IU]

## 2024-02-14 NOTE — Telephone Encounter (Signed)
 Pt education appt--[name] said pt is not coming to the education class today -"She will be too tired". [name] said ed nurse can call her before 130 , but not at 2 because her child has a pediatrician appt at 2.

## 2024-02-17 ENCOUNTER — Other Ambulatory Visit: Payer: Self-pay | Admitting: Medical Oncology

## 2024-02-17 ENCOUNTER — Other Ambulatory Visit: Payer: Self-pay

## 2024-02-17 ENCOUNTER — Other Ambulatory Visit

## 2024-02-17 ENCOUNTER — Telehealth: Payer: Self-pay | Admitting: Medical Oncology

## 2024-02-17 ENCOUNTER — Inpatient Hospital Stay

## 2024-02-17 ENCOUNTER — Encounter: Payer: Self-pay | Admitting: Medical Oncology

## 2024-02-17 ENCOUNTER — Encounter: Payer: Self-pay | Admitting: Internal Medicine

## 2024-02-17 ENCOUNTER — Encounter: Admitting: Internal Medicine

## 2024-02-17 ENCOUNTER — Inpatient Hospital Stay (HOSPITAL_COMMUNITY)
Admission: EM | Admit: 2024-02-17 | Discharge: 2024-02-20 | DRG: 638 | Disposition: A | Discharge status: Home / Self Care | Source: Ambulatory Visit | Attending: Internal Medicine | Admitting: Internal Medicine

## 2024-02-17 ENCOUNTER — Encounter (HOSPITAL_COMMUNITY): Payer: Self-pay

## 2024-02-17 VITALS — BP 131/75 | HR 87 | Temp 99.0°F | Resp 16 | Wt 146.2 lb

## 2024-02-17 DIAGNOSIS — R739 Hyperglycemia, unspecified: Secondary | ICD-10-CM | POA: Diagnosis present

## 2024-02-17 DIAGNOSIS — N179 Acute kidney failure, unspecified: Secondary | ICD-10-CM | POA: Diagnosis present

## 2024-02-17 DIAGNOSIS — N1831 Chronic kidney disease, stage 3a: Secondary | ICD-10-CM | POA: Diagnosis present

## 2024-02-17 DIAGNOSIS — E1165 Type 2 diabetes mellitus with hyperglycemia: Secondary | ICD-10-CM | POA: Diagnosis not present

## 2024-02-17 DIAGNOSIS — D649 Anemia, unspecified: Secondary | ICD-10-CM

## 2024-02-17 DIAGNOSIS — E7800 Pure hypercholesterolemia, unspecified: Secondary | ICD-10-CM | POA: Diagnosis present

## 2024-02-17 DIAGNOSIS — D61818 Other pancytopenia: Secondary | ICD-10-CM | POA: Diagnosis present

## 2024-02-17 DIAGNOSIS — Z604 Social exclusion and rejection: Secondary | ICD-10-CM | POA: Diagnosis present

## 2024-02-17 DIAGNOSIS — Z8249 Family history of ischemic heart disease and other diseases of the circulatory system: Secondary | ICD-10-CM | POA: Diagnosis not present

## 2024-02-17 DIAGNOSIS — Z8261 Family history of arthritis: Secondary | ICD-10-CM

## 2024-02-17 DIAGNOSIS — C3491 Malignant neoplasm of unspecified part of right bronchus or lung: Secondary | ICD-10-CM | POA: Diagnosis present

## 2024-02-17 DIAGNOSIS — Z95828 Presence of other vascular implants and grafts: Secondary | ICD-10-CM | POA: Diagnosis not present

## 2024-02-17 DIAGNOSIS — Z833 Family history of diabetes mellitus: Secondary | ICD-10-CM | POA: Diagnosis not present

## 2024-02-17 DIAGNOSIS — Z794 Long term (current) use of insulin: Secondary | ICD-10-CM

## 2024-02-17 DIAGNOSIS — Z87891 Personal history of nicotine dependence: Secondary | ICD-10-CM

## 2024-02-17 DIAGNOSIS — E1100 Type 2 diabetes mellitus with hyperosmolarity without nonketotic hyperglycemic-hyperosmolar coma (NKHHC): Principal | ICD-10-CM | POA: Diagnosis present

## 2024-02-17 DIAGNOSIS — Z79899 Other long term (current) drug therapy: Secondary | ICD-10-CM | POA: Diagnosis not present

## 2024-02-17 DIAGNOSIS — I480 Paroxysmal atrial fibrillation: Secondary | ICD-10-CM | POA: Diagnosis present

## 2024-02-17 DIAGNOSIS — Z944 Liver transplant status: Secondary | ICD-10-CM

## 2024-02-17 DIAGNOSIS — T451X5A Adverse effect of antineoplastic and immunosuppressive drugs, initial encounter: Secondary | ICD-10-CM | POA: Diagnosis present

## 2024-02-17 DIAGNOSIS — G8929 Other chronic pain: Secondary | ICD-10-CM | POA: Diagnosis present

## 2024-02-17 DIAGNOSIS — F32A Depression, unspecified: Secondary | ICD-10-CM | POA: Diagnosis present

## 2024-02-17 DIAGNOSIS — E871 Hypo-osmolality and hyponatremia: Secondary | ICD-10-CM | POA: Diagnosis present

## 2024-02-17 DIAGNOSIS — Z8619 Personal history of other infectious and parasitic diseases: Secondary | ICD-10-CM

## 2024-02-17 DIAGNOSIS — Z7984 Long term (current) use of oral hypoglycemic drugs: Secondary | ICD-10-CM

## 2024-02-17 DIAGNOSIS — E1122 Type 2 diabetes mellitus with diabetic chronic kidney disease: Secondary | ICD-10-CM | POA: Diagnosis present

## 2024-02-17 DIAGNOSIS — R54 Age-related physical debility: Secondary | ICD-10-CM | POA: Diagnosis present

## 2024-02-17 DIAGNOSIS — C787 Secondary malignant neoplasm of liver and intrahepatic bile duct: Secondary | ICD-10-CM | POA: Diagnosis present

## 2024-02-17 DIAGNOSIS — I129 Hypertensive chronic kidney disease with stage 1 through stage 4 chronic kidney disease, or unspecified chronic kidney disease: Secondary | ICD-10-CM | POA: Diagnosis present

## 2024-02-17 DIAGNOSIS — Z79630 Long term (current) use of alkylating agent: Secondary | ICD-10-CM

## 2024-02-17 DIAGNOSIS — Z9889 Other specified postprocedural states: Secondary | ICD-10-CM

## 2024-02-17 DIAGNOSIS — E872 Acidosis, unspecified: Secondary | ICD-10-CM | POA: Diagnosis present

## 2024-02-17 DIAGNOSIS — D61810 Antineoplastic chemotherapy induced pancytopenia: Secondary | ICD-10-CM | POA: Diagnosis present

## 2024-02-17 DIAGNOSIS — Z7969 Long term (current) use of other immunomodulators and immunosuppressants: Secondary | ICD-10-CM

## 2024-02-17 LAB — CBC WITH DIFFERENTIAL/PLATELET
Abs Immature Granulocytes: 0.04 10*3/uL (ref 0.00–0.07)
Basophils Absolute: 0 10*3/uL (ref 0.0–0.1)
Basophils Relative: 0 %
Eosinophils Absolute: 0 10*3/uL (ref 0.0–0.5)
Eosinophils Relative: 0 %
HCT: 23.9 % — ABNORMAL LOW (ref 36.0–46.0)
Hemoglobin: 7.2 g/dL — ABNORMAL LOW (ref 12.0–15.0)
Immature Granulocytes: 1 %
Lymphocytes Relative: 2 %
Lymphs Abs: 0.2 10*3/uL — ABNORMAL LOW (ref 0.7–4.0)
MCH: 28.2 pg (ref 26.0–34.0)
MCHC: 30.1 g/dL (ref 30.0–36.0)
MCV: 93.7 fL (ref 80.0–100.0)
Monocytes Absolute: 0.1 10*3/uL (ref 0.1–1.0)
Monocytes Relative: 2 %
Neutro Abs: 6.9 10*3/uL (ref 1.7–7.7)
Neutrophils Relative %: 95 %
Platelets: 103 10*3/uL — ABNORMAL LOW (ref 150–400)
RBC: 2.55 MIL/uL — ABNORMAL LOW (ref 3.87–5.11)
RDW: 16.8 % — ABNORMAL HIGH (ref 11.5–15.5)
WBC: 7.2 10*3/uL (ref 4.0–10.5)
nRBC: 0 % (ref 0.0–0.2)

## 2024-02-17 LAB — BASIC METABOLIC PANEL WITH GFR
Anion gap: 7 (ref 5–15)
Anion gap: 6 (ref 5–15)
Anion gap: 6 (ref 5–15)
BUN: 31 mg/dL — ABNORMAL HIGH (ref 8–23)
BUN: 29 mg/dL — ABNORMAL HIGH (ref 8–23)
BUN: 29 mg/dL — ABNORMAL HIGH (ref 8–23)
CO2: 18 mmol/L — ABNORMAL LOW (ref 22–32)
CO2: 19 mmol/L — ABNORMAL LOW (ref 22–32)
CO2: 21 mmol/L — ABNORMAL LOW (ref 22–32)
Calcium: 8.3 mg/dL — ABNORMAL LOW (ref 8.9–10.3)
Calcium: 8.5 mg/dL — ABNORMAL LOW (ref 8.9–10.3)
Calcium: 8.5 mg/dL — ABNORMAL LOW (ref 8.9–10.3)
Chloride: 103 mmol/L (ref 98–111)
Chloride: 107 mmol/L (ref 98–111)
Chloride: 108 mmol/L (ref 98–111)
Creatinine, Ser: 1.73 mg/dL — ABNORMAL HIGH (ref 0.44–1.00)
Creatinine, Ser: 1.58 mg/dL — ABNORMAL HIGH (ref 0.44–1.00)
Creatinine, Ser: 1.73 mg/dL — ABNORMAL HIGH (ref 0.44–1.00)
GFR, Estimated: 32 mL/min — ABNORMAL LOW (ref >60)
GFR, Estimated: 35 mL/min — ABNORMAL LOW (ref >60)
GFR, Estimated: 32 mL/min — ABNORMAL LOW (ref >60)
Glucose, Bld: 754 mg/dL (ref 70–99)
Glucose, Bld: 531 mg/dL (ref 70–99)
Glucose, Bld: 490 mg/dL — ABNORMAL HIGH (ref 70–99)
Potassium: 4.7 mmol/L (ref 3.5–5.1)
Potassium: 4.7 mmol/L (ref 3.5–5.1)
Potassium: 4.9 mmol/L (ref 3.5–5.1)
Sodium: 128 mmol/L — ABNORMAL LOW (ref 135–145)
Sodium: 132 mmol/L — ABNORMAL LOW (ref 135–145)
Sodium: 135 mmol/L (ref 135–145)

## 2024-02-17 LAB — CMP (CANCER CENTER ONLY)
ALT: 26 U/L (ref 0–44)
AST: 19 U/L (ref 15–41)
Albumin: 2.9 g/dL — ABNORMAL LOW (ref 3.5–5.0)
Alkaline Phosphatase: 472 U/L — ABNORMAL HIGH (ref 38–126)
Anion gap: 8 (ref 5–15)
BUN: 30 mg/dL — ABNORMAL HIGH (ref 8–23)
CO2: 20 mmol/L — ABNORMAL LOW (ref 22–32)
Calcium: 8.7 mg/dL — ABNORMAL LOW (ref 8.9–10.3)
Chloride: 103 mmol/L (ref 98–111)
Creatinine: 1.75 mg/dL — ABNORMAL HIGH (ref 0.44–1.00)
GFR, Estimated: 31 mL/min — ABNORMAL LOW (ref >60)
Glucose, Bld: 584 mg/dL (ref 70–99)
Potassium: 4.2 mmol/L (ref 3.5–5.1)
Sodium: 131 mmol/L — ABNORMAL LOW (ref 135–145)
Total Bilirubin: 0.8 mg/dL (ref 0.0–1.2)
Total Protein: 8.1 g/dL (ref 6.5–8.1)

## 2024-02-17 LAB — URINALYSIS, ROUTINE W REFLEX MICROSCOPIC
Bacteria, UA: NONE SEEN
Bilirubin Urine: NEGATIVE
Glucose, UA: >=500 mg/dL — AB
Ketones, ur: NEGATIVE mg/dL
Leukocytes,Ua: NEGATIVE
Nitrite: NEGATIVE
Protein, ur: NEGATIVE mg/dL
Specific Gravity, Urine: 1.022 (ref 1.005–1.030)
pH: 5 (ref 5.0–8.0)

## 2024-02-17 LAB — CBG MONITORING, ED
Glucose-Capillary: >600 mg/dL (ref 70–99)
Glucose-Capillary: 437 mg/dL — ABNORMAL HIGH (ref 70–99)
Glucose-Capillary: 440 mg/dL — ABNORMAL HIGH (ref 70–99)
Glucose-Capillary: 390 mg/dL — ABNORMAL HIGH (ref 70–99)
Glucose-Capillary: 442 mg/dL — ABNORMAL HIGH (ref 70–99)

## 2024-02-17 LAB — PREPARE RBC (CROSSMATCH)

## 2024-02-17 LAB — CBC WITH DIFFERENTIAL (CANCER CENTER ONLY)
Abs Immature Granulocytes: 0.06 10*3/uL (ref 0.00–0.07)
Basophils Absolute: 0 10*3/uL (ref 0.0–0.1)
Basophils Relative: 0 %
Eosinophils Absolute: 0.1 10*3/uL (ref 0.0–0.5)
Eosinophils Relative: 1 %
HCT: 24.5 % — ABNORMAL LOW (ref 36.0–46.0)
Hemoglobin: 7.7 g/dL — ABNORMAL LOW (ref 12.0–15.0)
Immature Granulocytes: 1 %
Lymphocytes Relative: 6 %
Lymphs Abs: 0.5 10*3/uL — ABNORMAL LOW (ref 0.7–4.0)
MCH: 27.3 pg (ref 26.0–34.0)
MCHC: 31.4 g/dL (ref 30.0–36.0)
MCV: 86.9 fL (ref 80.0–100.0)
Monocytes Absolute: 0.6 10*3/uL (ref 0.1–1.0)
Monocytes Relative: 7 %
Neutro Abs: 7.3 10*3/uL (ref 1.7–7.7)
Neutrophils Relative %: 85 %
Platelet Count: 124 10*3/uL — ABNORMAL LOW (ref 150–400)
RBC: 2.82 MIL/uL — ABNORMAL LOW (ref 3.87–5.11)
RDW: 16.9 % — ABNORMAL HIGH (ref 11.5–15.5)
WBC Count: 8.7 10*3/uL (ref 4.0–10.5)
nRBC: 0 % (ref 0.0–0.2)

## 2024-02-17 LAB — BLOOD GAS, VENOUS
Acid-base deficit: 6.9 mmol/L — ABNORMAL HIGH (ref 0.0–2.0)
Bicarbonate: 18.5 mmol/L — ABNORMAL LOW (ref 20.0–28.0)
O2 Saturation: 68.8 %
Patient temperature: 37
pCO2, Ven: 36 mmHg — ABNORMAL LOW (ref 44–60)
pH, Ven: 7.32 (ref 7.25–7.43)
pO2, Ven: 41 mmHg (ref 32–45)

## 2024-02-17 LAB — I-STAT CHEM 8, ED
BUN: 26 mg/dL — ABNORMAL HIGH (ref 8–23)
Calcium, Ion: 1.23 mmol/L (ref 1.15–1.40)
Chloride: 104 mmol/L (ref 98–111)
Creatinine, Ser: 1.7 mg/dL — ABNORMAL HIGH (ref 0.44–1.00)
Glucose, Bld: >700 mg/dL (ref 70–99)
HCT: 23 % — ABNORMAL LOW (ref 36.0–46.0)
Hemoglobin: 7.8 g/dL — ABNORMAL LOW (ref 12.0–15.0)
Potassium: 4.9 mmol/L (ref 3.5–5.1)
Sodium: 132 mmol/L — ABNORMAL LOW (ref 135–145)
TCO2: 18 mmol/L — ABNORMAL LOW (ref 22–32)

## 2024-02-17 LAB — BETA-HYDROXYBUTYRIC ACID
Beta-Hydroxybutyric Acid: 0.06 mmol/L (ref 0.05–0.27)
Beta-Hydroxybutyric Acid: 0.07 mmol/L (ref 0.05–0.27)

## 2024-02-17 LAB — ABO/RH: ABO/RH(D): A POS

## 2024-02-17 LAB — OSMOLALITY: Osmolality: 328 mosm/kg (ref 275–295)

## 2024-02-17 LAB — GLUCOSE, RANDOM: Glucose, Bld: 848 mg/dL (ref 70–99)

## 2024-02-17 LAB — MAGNESIUM: Magnesium: 1.7 mg/dL (ref 1.7–2.4)

## 2024-02-17 MED ORDER — ONDANSETRON HCL 4 MG/2ML IJ SOLN: 4.0000 mg | Freq: Four times a day (QID) | INTRAMUSCULAR | Status: DC | PRN

## 2024-02-17 MED ORDER — VITAMIN D 25 MCG (1000 UNIT) PO TABS
4000.0000 [IU] | ORAL_TABLET | Freq: Every day | ORAL | Status: DC
  Administered 2024-02-18 – 2024-02-20 (×3): 4000 [IU] via ORAL
  Filled 2024-02-17 (×3): qty 4

## 2024-02-17 MED ORDER — HYDROXYZINE HCL 25 MG PO TABS: 25.0000 mg | ORAL_TABLET | Freq: Three times a day (TID) | ORAL | Status: DC | PRN

## 2024-02-17 MED ORDER — LOSARTAN POTASSIUM 25 MG PO TABS
25.0000 mg | ORAL_TABLET | Freq: Every day | ORAL | Status: DC
  Administered 2024-02-18 – 2024-02-20 (×3): 25 mg via ORAL
  Filled 2024-02-17 (×4): qty 1

## 2024-02-17 MED ORDER — HYDRALAZINE HCL 20 MG/ML IJ SOLN: 5.0000 mg | Freq: Four times a day (QID) | INTRAMUSCULAR | Status: DC | PRN

## 2024-02-17 MED ORDER — OXYCODONE HCL 5 MG PO TABS
5.0000 mg | ORAL_TABLET | Freq: Four times a day (QID) | ORAL | Status: DC | PRN
  Administered 2024-02-18 – 2024-02-19 (×2): 5 mg via ORAL
  Filled 2024-02-17 (×2): qty 1

## 2024-02-17 MED ORDER — ALBUTEROL SULFATE (2.5 MG/3ML) 0.083% IN NEBU: 2.5000 mg | INHALATION_SOLUTION | RESPIRATORY_TRACT | Status: DC | PRN

## 2024-02-17 MED ORDER — INSULIN ASPART 100 UNIT/ML IJ SOLN
0.0000 [IU] | Freq: Three times a day (TID) | INTRAMUSCULAR | Status: DC
  Administered 2024-02-18: 15 [IU] via SUBCUTANEOUS
  Filled 2024-02-17: qty 0.15

## 2024-02-17 MED ORDER — LACTATED RINGERS IV BOLUS
1000.0000 mL | INTRAVENOUS | Status: AC
  Administered 2024-02-17: 1000 mL via INTRAVENOUS

## 2024-02-17 MED ORDER — HEPARIN SOD (PORK) LOCK FLUSH 100 UNIT/ML IV SOLN: 500.0000 [IU] | Freq: Once | INTRAVENOUS | Status: DC | PRN

## 2024-02-17 MED ORDER — SODIUM CHLORIDE 0.9 % IV SOLN: INTRAVENOUS | Status: DC

## 2024-02-17 MED ORDER — INSULIN GLARGINE-YFGN 100 UNIT/ML ~~LOC~~ SOLN
20.0000 [IU] | Freq: Every day | SUBCUTANEOUS | Status: DC
  Administered 2024-02-18 (×2): 20 [IU] via SUBCUTANEOUS
  Filled 2024-02-17 (×2): qty 0.2

## 2024-02-17 MED ORDER — INSULIN DEGLUDEC 200 UNIT/ML ~~LOC~~ SOPN: 20.0000 [IU] | PEN_INJECTOR | Freq: Every day | SUBCUTANEOUS | Status: DC

## 2024-02-17 MED ORDER — INSULIN ASPART 100 UNIT/ML IJ SOLN
10.0000 [IU] | Freq: Once | INTRAMUSCULAR | Status: AC
  Administered 2024-02-17: 10 [IU] via SUBCUTANEOUS
  Filled 2024-02-17: qty 1

## 2024-02-17 MED ORDER — SODIUM CHLORIDE 0.9% FLUSH
10.0000 mL | Freq: Once | INTRAVENOUS | Status: AC
  Administered 2024-02-17: 10 mL

## 2024-02-17 MED ORDER — AMLODIPINE BESYLATE 5 MG PO TABS
5.0000 mg | ORAL_TABLET | Freq: Two times a day (BID) | ORAL | Status: DC
  Administered 2024-02-18 – 2024-02-20 (×6): 5 mg via ORAL
  Filled 2024-02-17 (×7): qty 1

## 2024-02-17 MED ORDER — PALONOSETRON HCL INJECTION 0.25 MG/5ML
0.2500 mg | Freq: Once | INTRAVENOUS | Status: AC
  Administered 2024-02-17: 0.25 mg via INTRAVENOUS
  Filled 2024-02-17: qty 5

## 2024-02-17 MED ORDER — HEPARIN SODIUM (PORCINE) 5000 UNIT/ML IJ SOLN
5000.0000 [IU] | Freq: Three times a day (TID) | INTRAMUSCULAR | Status: DC
  Administered 2024-02-18 – 2024-02-20 (×7): 5000 [IU] via SUBCUTANEOUS
  Filled 2024-02-17 (×7): qty 1

## 2024-02-17 MED ORDER — DEXAMETHASONE SODIUM PHOSPHATE 10 MG/ML IJ SOLN
10.0000 mg | Freq: Once | INTRAMUSCULAR | Status: AC
  Administered 2024-02-17: 10 mg via INTRAVENOUS
  Filled 2024-02-17: qty 1

## 2024-02-17 MED ORDER — INSULIN ASPART 100 UNIT/ML IJ SOLN
5.0000 [IU] | Freq: Once | INTRAMUSCULAR | Status: AC
  Administered 2024-02-17: 5 [IU] via SUBCUTANEOUS
  Filled 2024-02-17: qty 0.05

## 2024-02-17 MED ORDER — TACROLIMUS 1 MG PO CAPS
1.0000 mg | ORAL_CAPSULE | Freq: Two times a day (BID) | ORAL | Status: DC
  Administered 2024-02-18 – 2024-02-20 (×6): 1 mg via ORAL
  Filled 2024-02-17 (×6): qty 1

## 2024-02-17 MED ORDER — SODIUM CHLORIDE 0.9 % IV SOLN
237.6000 mg | Freq: Once | INTRAVENOUS | Status: AC
  Administered 2024-02-17: 240 mg via INTRAVENOUS
  Filled 2024-02-17: qty 24

## 2024-02-17 MED ORDER — ACETAMINOPHEN 325 MG PO TABS: 650.0000 mg | ORAL_TABLET | Freq: Four times a day (QID) | ORAL | Status: DC | PRN

## 2024-02-17 MED ORDER — GABAPENTIN 300 MG PO CAPS
300.0000 mg | ORAL_CAPSULE | Freq: Every day | ORAL | Status: DC
  Administered 2024-02-18 – 2024-02-19 (×3): 300 mg via ORAL
  Filled 2024-02-17 (×3): qty 1

## 2024-02-17 MED ORDER — ACETAMINOPHEN 650 MG RE SUPP: 650.0000 mg | Freq: Four times a day (QID) | RECTAL | Status: DC | PRN

## 2024-02-17 MED ORDER — AZATHIOPRINE 50 MG PO TABS
100.0000 mg | ORAL_TABLET | Freq: Two times a day (BID) | ORAL | Status: DC
  Administered 2024-02-18: 100 mg via ORAL
  Filled 2024-02-17 (×2): qty 2

## 2024-02-17 MED ORDER — PRAVASTATIN SODIUM 40 MG PO TABS
40.0000 mg | ORAL_TABLET | Freq: Every day | ORAL | Status: DC
  Administered 2024-02-18 – 2024-02-20 (×3): 40 mg via ORAL
  Filled 2024-02-17 (×4): qty 1

## 2024-02-17 MED ORDER — ONDANSETRON HCL 4 MG PO TABS: 4.0000 mg | ORAL_TABLET | Freq: Four times a day (QID) | ORAL | Status: DC | PRN

## 2024-02-17 MED ORDER — SODIUM CHLORIDE 0.9% FLUSH: 10.0000 mL | INTRAVENOUS | Status: DC | PRN

## 2024-02-17 MED ORDER — INSULIN ASPART 100 UNIT/ML IJ SOLN
10.0000 [IU] | Freq: Once | INTRAMUSCULAR | Status: AC
  Administered 2024-02-17: 10 [IU] via SUBCUTANEOUS
  Filled 2024-02-17: qty 0.1

## 2024-02-17 MED ORDER — SODIUM CHLORIDE 0.9 % IV SOLN
80.0000 mg/m2 | Freq: Once | INTRAVENOUS | Status: DC
  Filled 2024-02-17: qty 7.1

## 2024-02-17 MED ORDER — APREPITANT 130 MG/18ML IV EMUL
130.0000 mg | Freq: Once | INTRAVENOUS | Status: AC
  Administered 2024-02-17: 130 mg via INTRAVENOUS
  Filled 2024-02-17: qty 18

## 2024-02-17 MED ORDER — TRILACICLIB DIHYDROCHLORIDE INJECTION 300 MG
240.0000 mg/m2 | Freq: Once | INTRAVENOUS | Status: AC
  Administered 2024-02-17: 420 mg via INTRAVENOUS
  Filled 2024-02-17: qty 28

## 2024-02-17 MED ORDER — MORPHINE SULFATE (PF) 2 MG/ML IV SOLN
2.0000 mg | INTRAVENOUS | Status: DC | PRN
  Administered 2024-02-18: 2 mg via INTRAVENOUS
  Filled 2024-02-17: qty 1

## 2024-02-17 MED ORDER — SODIUM CHLORIDE 0.9 % IV SOLN: INTRAVENOUS | Status: AC

## 2024-02-17 NOTE — ED Notes (Signed)
 Patient appears awake, alert and oriented. Breathing even and unlabored. No needs this time. Call bell within reach.

## 2024-02-17 NOTE — Progress Notes (Signed)
 Per Dr. Marguerita Shih , it is ok to treat pt today with Carboplatin  and etoposide  and creatinine =1.75 and hgb 7.7.  PCP notified re glucose 584.

## 2024-02-17 NOTE — Progress Notes (Signed)
 Patient has had high blood sugars since arrival for her morning lab draw today. Her original CMP showed a glucose level of 548. Dr. Albin Huh was aware of blood sugar and other issues with her labs today, but decided to continue with treatment. Treatment started with 10 units of novolog  to treat her blood sugar. As the day continued a recheck was done with a POC result of "hi" >600. Dr. Marguerita Shih ordered a blood draw to confirm her actual result. Results came back during her carboplatin  infusion and was 848 (the glucose level was drawn before cosela  administration). Critical was called to Dr. Marguerita Shih and Dr. Elnita Hai who was involved in patient care in regards to her daily insulin  orders. Both MDs agreed that the patient needed to go the the ER after the carboplatin  was completed. Per Dr. Elnita Hai and Dr. Bing Buff request- patient completed her carboplatin  and her PAC was salined locked. PAC left accessed for ED use. Report given to ER nurse handoff. MD in ER was also informed of situation. Etoposide  portion of her chemotherapy was not completed- it was sent back to pharmacy.

## 2024-02-17 NOTE — Progress Notes (Signed)
Blood transfusion ordered.

## 2024-02-17 NOTE — Patient Instructions (Signed)
 [facility] - A DEPT OF [facility]  Discharge Instructions: Thank you for choosing [facility] to provide your oncology and hematology care.   If you have a lab appointment with the Cancer Center, please go directly to the Cancer Center and check in at the registration area.   Wear comfortable clothing and clothing appropriate for easy access to any Portacath or PICC line.   We strive to give you quality time with your provider. You may need to reschedule your appointment if you arrive late (15 or more minutes).  Arriving late affects you and other patients whose appointments are after yours.  Also, if you miss three or more appointments without notifying the office, you may be dismissed from the clinic at the provider's discretion.      For prescription refill requests, have your pharmacy contact our office and allow 72 hours for refills to be completed.    Today you received the following chemotherapy and/or immunotherapy agents: Cosela , Carboplatin , Etoposide       To help prevent nausea and vomiting after your treatment, we encourage you to take your nausea medication as directed.  BELOW ARE SYMPTOMS THAT SHOULD BE REPORTED IMMEDIATELY: *FEVER GREATER THAN 100.4 F (38 C) OR HIGHER *CHILLS OR SWEATING *NAUSEA AND VOMITING THAT IS NOT CONTROLLED WITH YOUR NAUSEA MEDICATION *UNUSUAL SHORTNESS OF BREATH *UNUSUAL BRUISING OR BLEEDING *URINARY PROBLEMS (pain or burning when urinating, or frequent urination) *BOWEL PROBLEMS (unusual diarrhea, constipation, pain near the anus) TENDERNESS IN MOUTH AND THROAT WITH OR WITHOUT PRESENCE OF ULCERS (sore throat, sores in mouth, or a toothache) UNUSUAL RASH, SWELLING OR PAIN  UNUSUAL VAGINAL DISCHARGE OR ITCHING   Items with * indicate a potential emergency and should be followed up as soon as possible or go to the Emergency Department if any problems should occur.  Please show the CHEMOTHERAPY ALERT  CARD or IMMUNOTHERAPY ALERT CARD at check-in to the Emergency Department and triage nurse.  Should you have questions after your visit or need to cancel or reschedule your appointment, please contact [facility] - A DEPT OF [facility]  Dept: [phone]  and follow the prompts.  Office hours are 8:00 a.m. to 4:30 p.m. Monday - Friday. Please note that voicemails left after 4:00 p.m. may not be returned until the following business day.  We are closed weekends and major holidays. You have access to a nurse at all times for urgent questions. Please call the main number to the clinic Dept: [phone] and follow the prompts.   For any non-urgent questions, you may also contact your provider using MyChart. We now offer e-Visits for anyone 69 and older to request care online for non-urgent symptoms. For details visit [email or web address].   Also download the MyChart app! Go to the app store, search "MyChart", open the app, select [facility], and log in with your MyChart username and password.  Trilaciclib Injection What is this medication? TRILACICLIB (TRYE la SYE klib) prevents low levels of red blood cells, white blood cells, and platelets caused by chemotherapy. It works by protecting the cells in your bone marrow that make these blood cells. This lowers the risk of infection and bleeding. It also reduces the need for blood transfusions. This medicine may be used for other purposes; ask your health care provider or pharmacist if you have questions. COMMON BRAND NAME(S): COSELA  What should I tell my care team before I take  this medication? They need to know if you have any of these conditions: Liver disease An unusual or allergic reaction to trilaciclib, other medications, foods, dyes, or preservatives Pregnant or trying to get pregnant Breastfeeding How should I use this medication? This medication is injected into a vein. It is given by your care team in a  hospital or clinic setting. Talk to your care team about the use of this medication in children. Special care may be needed. Overdosage: If you think you have taken too much of this medicine contact a poison control center or emergency room at once. NOTE: This medicine is only for you. Do not share this medicine with others. What if I miss a dose? Keep appointments for follow-up doses. It is important to not miss your dose. Call your care team if you are unable to keep an appointment. What may interact with this medication? Cisplatin Dalfampridine Dofetilide Metformin This list may not describe all possible interactions. Give your health care provider a list of all the medicines, herbs, non-prescription drugs, or dietary supplements you use. Also tell them if you smoke, drink alcohol, or use illegal drugs. Some items may interact with your medicine. What should I watch for while using this medication? Your condition will be monitored carefully while you are receiving this medication. Talk to your care team if you may be pregnant. Serious birth defects can occur if you take this medication during pregnancy and for 3 weeks after the last dose. You will need a negative pregnancy test before starting this medication. Contraception is recommended while taking this medication and for 3 weeks after the last dose. Your care team can help you find the option that works for you. Do not breastfeed while taking this medication and for 3 weeks after the last dose. This medication may cause infertility. Talk to your care team if you are concerned about your fertility. What side effects may I notice from receiving this medication? Side effects that you should report to your care team as soon as possible: Allergic reactions--skin rash, itching, hives, swelling of the face, lips, tongue, or throat Dry cough, shortness of breath or trouble breathing Painful swelling, warmth, or redness of the skin, blisters or  sores at the infusion site Side effects that usually do not require medical attention (report these to your care team if they continue or are bothersome): Fatigue Headache This list may not describe all possible side effects. Call your doctor for medical advice about side effects. You may report side effects to FDA at 1-800-FDA-1088. Where should I keep my medication? This medication is given in a hospital or clinic. It will not be stored at home. NOTE: This sheet is a summary. It may not cover all possible information. If you have questions about this medicine, talk to your doctor, pharmacist, or health care provider.  2024 Elsevier/Gold Standard (2023-09-27 00:00:00)  Carboplatin  Injection What is this medication? CARBOPLATIN  (KAR boe pla tin) treats some types of cancer. It works by slowing down the growth of cancer cells. This medicine may be used for other purposes; ask your health care provider or pharmacist if you have questions. COMMON BRAND NAME(S): Paraplatin  What should I tell my care team before I take this medication? They need to know if you have any of these conditions: Blood disorders Hearing problems Kidney disease Recent or ongoing radiation therapy An unusual or allergic reaction to carboplatin , cisplatin, other medications, foods, dyes, or preservatives Pregnant or trying to get pregnant Breast-feeding How should  I use this medication? This medication is injected into a vein. It is given by your care team in a hospital or clinic setting. Talk to your care team about the use of this medication in children. Special care may be needed. Overdosage: If you think you have taken too much of this medicine contact a poison control center or emergency room at once. NOTE: This medicine is only for you. Do not share this medicine with others. What if I miss a dose? Keep appointments for follow-up doses. It is important not to miss your dose. Call your care team if you are  unable to keep an appointment. What may interact with this medication? Medications for seizures Some antibiotics, such as amikacin, gentamicin, neomycin, streptomycin, tobramycin Vaccines This list may not describe all possible interactions. Give your health care provider a list of all the medicines, herbs, non-prescription drugs, or dietary supplements you use. Also tell them if you smoke, drink alcohol, or use illegal drugs. Some items may interact with your medicine. What should I watch for while using this medication? Your condition will be monitored carefully while you are receiving this medication. You may need blood work while taking this medication. This medication may make you feel generally unwell. This is not uncommon, as chemotherapy can affect healthy cells as well as cancer cells. Report any side effects. Continue your course of treatment even though you feel ill unless your care team tells you to stop. In some cases, you may be given additional medications to help with side effects. Follow all directions for their use. This medication may increase your risk of getting an infection. Call your care team for advice if you get a fever, chills, sore throat, or other symptoms of a cold or flu. Do not treat yourself. Try to avoid being around people who are sick. Avoid taking medications that contain aspirin , acetaminophen , ibuprofen, naproxen, or ketoprofen unless instructed by your care team. These medications may hide a fever. Be careful brushing or flossing your teeth or using a toothpick because you may get an infection or bleed more easily. If you have any dental work done, tell your dentist you are receiving this medication. Talk to your care team if you wish to become pregnant or think you might be pregnant. This medication can cause serious birth defects. Talk to your care team about effective forms of contraception. Do not breast-feed while taking this medication. What side effects  may I notice from receiving this medication? Side effects that you should report to your care team as soon as possible: Allergic reactions--skin rash, itching, hives, swelling of the face, lips, tongue, or throat Infection--fever, chills, cough, sore throat, wounds that don't heal, pain or trouble when passing urine, general feeling of discomfort or being unwell Low red blood cell level--unusual weakness or fatigue, dizziness, headache, trouble breathing Pain, tingling, or numbness in the hands or feet, muscle weakness, change in vision, confusion or trouble speaking, loss of balance or coordination, trouble walking, seizures Unusual bruising or bleeding Side effects that usually do not require medical attention (report to your care team if they continue or are bothersome): Hair loss Nausea Unusual weakness or fatigue Vomiting This list may not describe all possible side effects. Call your doctor for medical advice about side effects. You may report side effects to FDA at 1-800-FDA-1088. Where should I keep my medication? This medication is given in a hospital or clinic. It will not be stored at home. NOTE: This sheet is a summary.  It may not cover all possible information. If you have questions about this medicine, talk to your doctor, pharmacist, or health care provider.  2024 Elsevier/Gold Standard (2022-02-06 00:00:00)  Etoposide  Injection What is this medication? ETOPOSIDE  (e toe POE side) treats some types of cancer. It works by slowing down the growth of cancer cells. This medicine may be used for other purposes; ask your health care provider or pharmacist if you have questions. COMMON BRAND NAME(S): Etopophos, Toposar , VePesid  What should I tell my care team before I take this medication? They need to know if you have any of these conditions: Infection Kidney disease Liver disease Low blood counts, such as low white cell, platelet, red cell counts An unusual or allergic reaction  to etoposide , other medications, foods, dyes, or preservatives If you or your partner are pregnant or trying to get pregnant Breastfeeding How should I use this medication? This medication is injected into a vein. It is given by your care team in a hospital or clinic setting. Talk to your care team about the use of this medication in children. Special care may be needed. Overdosage: If you think you have taken too much of this medicine contact a poison control center or emergency room at once. NOTE: This medicine is only for you. Do not share this medicine with others. What if I miss a dose? Keep appointments for follow-up doses. It is important not to miss your dose. Call your care team if you are unable to keep an appointment. What may interact with this medication? Warfarin This list may not describe all possible interactions. Give your health care provider a list of all the medicines, herbs, non-prescription drugs, or dietary supplements you use. Also tell them if you smoke, drink alcohol, or use illegal drugs. Some items may interact with your medicine. What should I watch for while using this medication? Your condition will be monitored carefully while you are receiving this medication. This medication may make you feel generally unwell. This is not uncommon as chemotherapy can affect healthy cells as well as cancer cells. Report any side effects. Continue your course of treatment even though you feel ill unless your care team tells you to stop. This medication can cause serious side effects. To reduce the risk, your care team may give you other medications to take before receiving this one. Be sure to follow the directions from your care team. This medication may increase your risk of getting an infection. Call your care team for advice if you get a fever, chills, sore throat, or other symptoms of a cold or flu. Do not treat yourself. Try to avoid being around people who are sick. This  medication may increase your risk to bruise or bleed. Call your care team if you notice any unusual bleeding. Talk to your care team about your risk of cancer. You may be more at risk for certain types of cancers if you take this medication. Talk to your care team if you may be pregnant. Serious birth defects can occur if you take this medication during pregnancy and for 6 months after the last dose. You will need a negative pregnancy test before starting this medication. Contraception is recommended while taking this medication and for 6 months after the last dose. Your care team can help you find the option that works for you. If your partner can get pregnant, use a condom during sex while taking this medication and for 4 months after the last dose. Do not breastfeed  while taking this medication. This medication may cause infertility. Talk to your care team if you are concerned about your fertility. What side effects may I notice from receiving this medication? Side effects that you should report to your care team as soon as possible: Allergic reactions--skin rash, itching, hives, swelling of the face, lips, tongue, or throat Infection--fever, chills, cough, sore throat, wounds that don't heal, pain or trouble when passing urine, general feeling of discomfort or being unwell Low red blood cell level--unusual weakness or fatigue, dizziness, headache, trouble breathing Unusual bruising or bleeding Side effects that usually do not require medical attention (report to your care team if they continue or are bothersome): Diarrhea Fatigue Hair loss Loss of appetite Nausea Vomiting This list may not describe all possible side effects. Call your doctor for medical advice about side effects. You may report side effects to FDA at 1-800-FDA-1088. Where should I keep my medication? This medication is given in a hospital or clinic. It will not be stored at home. NOTE: This sheet is a summary. It may not  cover all possible information. If you have questions about this medicine, talk to your doctor, pharmacist, or health care provider.  2024 Elsevier/Gold Standard (2022-03-08 00:00:00)

## 2024-02-17 NOTE — Progress Notes (Signed)
 Released prepare for blood products

## 2024-02-17 NOTE — Telephone Encounter (Signed)
 [name] , MD  notified of pts Glucose today = 584. Mohamed ordered 10 u insulin .   Courtlyn said she takes insulin  only at night. Her med list says BID.

## 2024-02-17 NOTE — ED Triage Notes (Signed)
 Pt arrives from cancer center for elevated blood sugar. Was in the 400s , got 10 units of insulin , still rising into 500s and 600s. Last in 800s and then received tx in 250cc D5. Pt has no complaints.  Pt attended family event yesterday, had sweet tea, cake, juice, lots of carbs. Had regular evening insulin  last night but reports she does not have meal coverage.

## 2024-02-17 NOTE — Progress Notes (Signed)
 CRITICAL VALUE STICKER  CRITICAL VALUE: glucose 584  RECEIVER (on-site recipient of call): [facility]   DATE & TIME NOTIFIED: 02/17/2024 @ 1050  MESSENGER (representative from lab): Heather   MD NOTIFIED: Dr. Marguerita Shih  TIME OF NOTIFICATION: 1056  RESPONSE: Give 10 units of insulin  per Dr. Marguerita Shih

## 2024-02-17 NOTE — Telephone Encounter (Signed)
 Report given to I-li, RN in ED , who said to bring pt to inside registration.

## 2024-02-17 NOTE — ED Provider Notes (Signed)
 [place] EMERGENCY DEPARTMENT AT [facility] Provider Note   CSN: [id number] Arrival date & time: 02/17/24  1433     History  Chief Complaint  Patient presents with   Hyperglycemia    Kathryn Walsh is a 68 y.o. female.  HPI Patient presents for hyperglycemia.  Medical history includes DM, HTN, CKD, depression, lung cancer, liver transplant.  Her medications include tacrolimus , insulin , Farxiga , azathioprine .  Last dose of insulin  at home was Tresiba  last night.  When she arrived at her chemotherapy appointment today, sugar was in the range of 550.  She was given 10 units of lispro.  Labs were rechecked prior to her chemotherapy.  She was found to have further elevated to the range of 850.  She did complete her infusion of carboplatin .  She was then sent to the ED for further evaluation.  Patient denies any current symptoms.  She states that her sugars have been controlled at home.    Home Medications Prior to Admission medications   Medication Sig Start Date End Date Taking? Authorizing Provider  amLODipine  (NORVASC ) 5 MG tablet TAKE 1 TABLET BY MOUTH TWICE A DAY Patient not taking: Reported on 02/11/2024 12/30/23   [name], MD  azaTHIOprine  (IMURAN ) 50 MG tablet Take 100 mg by mouth 2 (two) times daily.     [provider]  B-D UF III MINI PEN NEEDLES 31G X 5 MM MISC Inject into the skin as directed. 12/30/23   [provider]  Blood Glucose Monitoring Suppl (ACCU-CHEK GUIDE ME) w/Device KIT Use to check blood sugar 3 times a day. Dx code e11.65 07/28/20   [name], MD  Cholecalciferol  25 MCG (1000 UT) capsule Take 4,000 Units by mouth daily.    [provider]  cyclobenzaprine  (FLEXERIL ) 10 MG tablet TAKE 1 TABLET BY MOUTH EVERYDAY AT BEDTIME Patient not taking: Reported on 11/07/2023 02/14/21   Ghumman, Ramandeep, NP  dapagliflozin  propanediol (FARXIGA ) 10 MG TABS tablet Take 1 tablet (10 mg total) by mouth daily before breakfast.  02/14/23   [name], MD  gabapentin  (NEURONTIN ) 300 MG capsule Take 1 capsule by mouth at bedtime. 01/02/24 01/01/25  [provider]  glucose blood (ACCU-CHEK GUIDE) test strip Use as instructed to check blood sugar 3 times a day. Dx code e11.65 07/28/20   [name], MD  hydrOXYzine  (ATARAX ) 25 MG tablet Take 25 mg by mouth 3 (three) times daily as needed. 01/06/24   [provider]  insulin  degludec (TRESIBA  FLEXTOUCH) 200 UNIT/ML FlexTouch Pen Inject 25 units nightly 12/10/22   [name], MD  insulin  lispro (HUMALOG  KWIKPEN) 200 UNIT/ML KwikPen Inject 6 Units into the skin 2 (two) times daily before a meal. Please inject 6 units before breakfast and dinner; max dose titration 40 units daily 03/19/22   [name], MD  lidocaine -prilocaine  (EMLA ) cream Apply to affected area once 02/10/24   Marlene Simas, MD  losartan  (COZAAR ) 25 MG tablet Take 25 mg by mouth daily. Patient not taking: Reported on 02/13/2024 08/21/23   [provider]  morphine  (MSIR) 15 MG tablet Take 15 mg by mouth every 4 (four) hours as needed. 01/02/24   [provider]  ondansetron  (ZOFRAN ) 8 MG tablet Take 1 tablet (8 mg total) by mouth every 8 (eight) hours as needed for nausea or vomiting. Start on third day after chemotherapy. 02/10/24   Marlene Simas, MD  ondansetron  (ZOFRAN -ODT) 4 MG disintegrating tablet Take 4 mg by mouth every 8 (eight) hours as needed  for nausea or vomiting. 12/30/23   [provider]  oxyCODONE  (OXY IR/ROXICODONE ) 5 MG immediate release tablet Take 5 mg by mouth every 4 (four) hours as needed. 12/30/23   [provider]  pravastatin  (PRAVACHOL ) 40 MG tablet TAKE 1 TABLET BY MOUTH EVERYDAY AT BEDTIME 12/26/23   [name], MD  prochlorperazine  (COMPAZINE ) 10 MG tablet Take 1 tablet (10 mg total) by mouth every 6 (six) hours as needed for nausea or vomiting (Nausea or vomiting). 02/10/24   Marlene Simas, MD  tacrolimus  (PROGRAF ) 1 MG  capsule Take 2 mg in am and 3 mg in pm 11/19/16   [provider]  TURMERIC PO Take by mouth. Patient not taking: Reported on 10/02/2023    [provider]      Allergies    Patient has no known allergies.    Review of Systems   Review of Systems  Constitutional:  Positive for fatigue.  Gastrointestinal:  Negative for nausea and vomiting.  All other systems reviewed and are negative.   Physical Exam Updated Vital Signs BP 124/65   Pulse 70   Temp 98.4 F (36.9 C) (Oral)   Resp 18   SpO2 97%  Physical Exam Vitals and nursing note reviewed.  Constitutional:      General: She is not in acute distress.    Appearance: Normal appearance. She is well-developed. She is not toxic-appearing or diaphoretic.  HENT:     Head: Normocephalic and atraumatic.     Right Ear: External ear normal.     Left Ear: External ear normal.     Nose: Nose normal.  Eyes:     Extraocular Movements: Extraocular movements intact.     Conjunctiva/sclera: Conjunctivae normal.  Cardiovascular:     Rate and Rhythm: Normal rate and regular rhythm.  Pulmonary:     Effort: Pulmonary effort is normal. No respiratory distress.  Abdominal:     General: There is no distension.     Palpations: Abdomen is soft.  Musculoskeletal:        General: No swelling. Normal range of motion.     Cervical back: Normal range of motion and neck supple.  Skin:    General: Skin is warm and dry.     Coloration: Skin is not jaundiced or pale.  Neurological:     General: No focal deficit present.     Mental Status: She is alert and oriented to person, place, and time.  Psychiatric:        Mood and Affect: Mood normal.        Behavior: Behavior normal.     ED Results / Procedures / Treatments   Labs (all labs ordered are listed, but only abnormal results are displayed) Labs Reviewed  BASIC METABOLIC PANEL WITH GFR - Abnormal; Notable for the following components:      Result Value   Sodium 128 (*)     CO2 18 (*)    Glucose, Bld 754 (*)    BUN 31 (*)    Creatinine, Ser 1.73 (*)    Calcium 8.3 (*)    GFR, Estimated 32 (*)    All other components within normal limits  CBC WITH DIFFERENTIAL/PLATELET - Abnormal; Notable for the following components:   RBC 2.55 (*)    Hemoglobin 7.2 (*)    HCT 23.9 (*)    RDW 16.8 (*)    Platelets 103 (*)    Lymphs Abs 0.2 (*)    All other components within normal  limits  BLOOD GAS, VENOUS - Abnormal; Notable for the following components:   pCO2, Ven 36 (*)    Bicarbonate 18.5 (*)    Acid-base deficit 6.9 (*)    All other components within normal limits  CBG MONITORING, ED - Abnormal; Notable for the following components:   Glucose-Capillary >600 (*)    All other components within normal limits  I-STAT CHEM 8, ED - Abnormal; Notable for the following components:   Sodium 132 (*)    BUN 26 (*)    Creatinine, Ser 1.70 (*)    Glucose, Bld >700 (*)    TCO2 18 (*)    Hemoglobin 7.8 (*)    HCT 23.0 (*)    All other components within normal limits  BETA-HYDROXYBUTYRIC ACID  MAGNESIUM   BASIC METABOLIC PANEL WITH GFR  BASIC METABOLIC PANEL WITH GFR  BASIC METABOLIC PANEL WITH GFR  OSMOLALITY  URINALYSIS, ROUTINE W REFLEX MICROSCOPIC  BETA-HYDROXYBUTYRIC ACID    EKG None  Radiology No results found.  Procedures Procedures    Medications Ordered in ED Medications  insulin  aspart (novoLOG ) injection 10 Units (has no administration in time range)  lactated ringers  bolus 1,000 mL (has no administration in time range)  lactated ringers  bolus 1,000 mL (1,000 mLs Intravenous New Bag/Given 02/17/24 1528)    ED Course/ Medical Decision Making/ A&P                                 Medical Decision Making Amount and/or Complexity of Data Reviewed Labs: ordered.  Risk Prescription drug management.   This patient presents to the ED for concern of hyperglycemia, this involves an extensive number of treatment options, and is a complaint that  carries with it a high risk of complications and morbidity.  The differential diagnosis includes DKA, HHS, dehydration, metabolic derangements   Co morbidities that complicate the patient evaluation  DM, HTN, CKD, depression, lung cancer, liver transplant   Additional history obtained:  Additional history obtained from cancer center staff External records from outside source obtained and reviewed including EMR   Lab Tests:  I Ordered, and personally interpreted labs.  The pertinent results include: Hyperglycemia without evidence of DKA, acute on chronic anemia, no leukocytosis, baseline creatinine, pseudohyponatremia  Cardiac Monitoring: / EKG:  The patient was maintained on a cardiac monitor.  I personally viewed and interpreted the cardiac monitored which showed an underlying rhythm of: Sinus rhythm   Problem List / ED Course / Critical interventions / Medication management  Patient presents for hyperglycemia.  This was identified at her cancer center appointment for chemotherapy infusion.  She did undergo her infusion and was given a 10 unit correction dose of insulin  while there.  On arrival in the ED, patient denies any physical complaints.  She states that she has had no new recent symptoms.  When asked about polyuria, she does state that she urinates a lot.  Patient reports that she has had diabetes for the past 25 years.  She states that she was previously on mealtime insulin  but this was discontinued due to better control of her blood sugars with long-acting at night and diet.  She states that she did eat some sweet foods yesterday.  She states that she does check her sugar every day and that it has been in the 100s.  Hyperglycemia on lab work in the ED was confirmed.  IV fluids were ordered.  Patient states that  she is not willing to stay in the hospital tonight but is willing to undergo treatment in the ED for correction of her hyperglycemia.  Lab work is not consistent with DKA.   Osmolality pending at time of signout.  Care of patient signed out to oncoming ED provider. I ordered medication including IV fluids for hyperglycemia Reevaluation of the patient after these medicines showed that the patient improved I have reviewed the patients home medicines and have made adjustments as needed   Social Determinants of Health:  Has access to outpatient care        Final Clinical Impression(s) / ED Diagnoses Final diagnoses:  Hyperglycemia    Rx / DC Orders ED Discharge Orders     None         Iva Mariner, MD 02/17/24 1657

## 2024-02-17 NOTE — ED Provider Notes (Signed)
 4:40 PM Assumed care of patient from off-going team. For more details, please see note from same day.  In brief, this is a 68 y.o. female w/ h/o lung cancer, went for chemotherapy and BG was in 700s.   Plan/Dispo at time of sign-out & ED Course since sign-out: [ ]  IVF, insulin   BP 124/65   Pulse 70   Temp 98.4 F (36.9 C) (Oral)   Resp 18   SpO2 97%    ED Course:   Clinical Course as of 02/17/24 2309  Mon Feb 17, 2024  1906 Glucose-Capillary(!): 437 Improved significantly [HN]  2022 Glucose(!!): 531 From 1910 [HN]  2023 Basic metabolic panel(!!) Improving Na and Cr [HN]  2025 Glucose-Capillary(!): 440 Stable from two hours ago, will d/w patient. [HN]  2025 Ketones, ur: NEGATIVE No ketonuria [HN]  2025 pH, Ven: 7.32 No acidosis, likely not in DKA [HN]  2117 Hemoglobin(!): 7.2 Worsening anemia [HN]  2130 Patient has now decided that she would like to be admitted, which I feel would be best for her for better glucose control, as she only has nighttime insulin  at home and has been NPO all day, with fluids/insulin  her sugar only decreasing into 400s-500s. She is likely in early HHS, and her anemia is also worsening. Denies hematochezia/melena but would be worth rechecking in the AM. Patient agrees to be admitted. Patient will be admitted to medicine. [HN]    Clinical Course User Index [HN] Merdis Stalling, MD   ------------------------------- Annita Kindle, MD Emergency Medicine  This note was created using dictation software, which may contain spelling or grammatical errors.  CRITICAL CARE Performed by: Merdis Stalling Total critical care time: 35 minutes Critical care time was exclusive of separately billable procedures and treating other patients. Critical care was necessary to treat or prevent imminent or life-threatening deterioration. Critical care was time spent personally by me on the following activities: development of treatment plan with patient and/or surrogate  as well as nursing, discussions with consultants, evaluation of patient's response to treatment, examination of patient, obtaining history from patient or surrogate, ordering and performing treatments and interventions, ordering and review of laboratory studies, ordering and review of radiographic studies, pulse oximetry and re-evaluation of patient's condition.     Merdis Stalling, MD 02/17/24 [phone]

## 2024-02-17 NOTE — H&P (Signed)
 History and Physical    Patient: Kathryn Walsh:[id number] DOB: 1956/10/16 DOA: 02/17/2024 DOS: the patient was seen and examined on 02/17/2024 PCP: [name], MD  Patient coming from: Home  Chief Complaint:  Chief Complaint  Patient presents with   Hyperglycemia   HPI: Flower Franko is a 68 y.o. female with medical history significant of diabetes mellitus on insulin , hyperlipidemia, CKD, hypertension, history of liver transplant on Imuran  and Prograf .  Patient also has new diagnosis of lung cancer and currently receiving chemotherapy.  She received her second round of chemotherapy today and was noted to have elevated blood glucose in the 700s.  She was thus referred to the ER to be further evaluated.  Patient admits compliance with her insulin  regimen at home.  His blood sugar last night before her chemotherapy this morning was 141.  She is quite unsure why her blood glucose has been uncontrolled today.  She denied any headache, dizziness, nausea or vomiting.  Denies any abdominal pain.  Denies any blurry vision.  She also denies any polyuria or polydipsia.   Review of Systems: As mentioned in the history of present illness. All other systems reviewed and are negative. Past Medical History:  Diagnosis Date   Diabetes mellitus without complication (HCC)    High cholesterol    HTN (hypertension)    Liver transplant recipient [facility])    Past Surgical History:  Procedure Laterality Date   IR IMAGING GUIDED PORT INSERTION  02/14/2024   LIVER TRANSPLANTATION     Social History:  reports that she has quit smoking. Her smoking use included cigarettes. She started smoking about 49 years ago. She has a 36.9 pack-year smoking history. She has never used smokeless tobacco. She reports that she does not drink alcohol and does not use drugs.  No Known Allergies  Family History  Problem Relation Age of Onset   Healthy Mother    Healthy Father    Heart disease Sister    Heart  disease Brother    Gout Brother    Heart disease Brother    Healthy Daughter    Diabetes Son     Prior to Admission medications   Medication Sig Start Date End Date Taking? Authorizing Provider  amLODipine  (NORVASC ) 5 MG tablet TAKE 1 TABLET BY MOUTH TWICE A DAY Patient not taking: Reported on 02/11/2024 12/30/23   [name], MD  azaTHIOprine  (IMURAN ) 50 MG tablet Take 100 mg by mouth 2 (two) times daily.     [provider]  B-D UF III MINI PEN NEEDLES 31G X 5 MM MISC Inject into the skin as directed. 12/30/23   [provider]  Blood Glucose Monitoring Suppl (ACCU-CHEK GUIDE ME) w/Device KIT Use to check blood sugar 3 times a day. Dx code e11.65 07/28/20   [name], MD  Cholecalciferol  25 MCG (1000 UT) capsule Take 4,000 Units by mouth daily.    [provider]  cyclobenzaprine  (FLEXERIL ) 10 MG tablet TAKE 1 TABLET BY MOUTH EVERYDAY AT BEDTIME Patient not taking: Reported on 11/07/2023 02/14/21   Ghumman, Ramandeep, NP  dapagliflozin  propanediol (FARXIGA ) 10 MG TABS tablet Take 1 tablet (10 mg total) by mouth daily before breakfast. 02/14/23   [name], MD  gabapentin  (NEURONTIN ) 300 MG capsule Take 1 capsule by mouth at bedtime. 01/02/24 01/01/25  [provider]  glucose blood (ACCU-CHEK GUIDE) test strip Use as instructed to check blood sugar 3 times a day. Dx code e11.65 07/28/20   [name], MD  hydrOXYzine  (  ATARAX ) 25 MG tablet Take 25 mg by mouth 3 (three) times daily as needed. 01/06/24   [provider]  insulin  degludec (TRESIBA  FLEXTOUCH) 200 UNIT/ML FlexTouch Pen Inject 25 units nightly 12/10/22   [name], MD  insulin  lispro (HUMALOG  [facility]) 200 UNIT/ML KwikPen Inject 6 Units into the skin 2 (two) times daily before a meal. Please inject 6 units before breakfast and dinner; max dose titration 40 units daily 03/19/22   [name], MD  lidocaine -prilocaine  (EMLA ) cream Apply to affected area once 02/10/24   Marlene Simas, MD  losartan  (COZAAR ) 25 MG tablet Take 25 mg by mouth daily. 08/21/23   [provider]  morphine  (MSIR) 15 MG tablet Take 15 mg by mouth every 4 (four) hours as needed. 01/02/24   [provider]  ondansetron  (ZOFRAN ) 8 MG tablet Take 1 tablet (8 mg total) by mouth every 8 (eight) hours as needed for nausea or vomiting. Start on third day after chemotherapy. 02/10/24   Marlene Simas, MD  ondansetron  (ZOFRAN -ODT) 4 MG disintegrating tablet Take 4 mg by mouth every 8 (eight) hours as needed for nausea or vomiting. 12/30/23   [provider]  oxyCODONE  (OXY IR/ROXICODONE ) 5 MG immediate release tablet Take 5 mg by mouth every 4 (four) hours as needed. 12/30/23   [provider]  pravastatin  (PRAVACHOL ) 40 MG tablet TAKE 1 TABLET BY MOUTH EVERYDAY AT BEDTIME 12/26/23   [name], MD  prochlorperazine  (COMPAZINE ) 10 MG tablet Take 1 tablet (10 mg total) by mouth every 6 (six) hours as needed for nausea or vomiting (Nausea or vomiting). 02/10/24   Marlene Simas, MD  tacrolimus  (PROGRAF ) 1 MG capsule Take 2 mg in am and 3 mg in pm 11/19/16   [provider]  TURMERIC PO Take by mouth.    [provider]    Physical Exam: Vitals:   02/17/24 1645 02/17/24 1700 02/17/24 1701 02/17/24 1914  BP: 134/67 136/73    Pulse: 73 76    Resp:      Temp:    98 F (36.7 C)  TempSrc:    Oral  SpO2: 98% 100%    Weight:   66.7 kg   Height:   5\' 7"  (1.702 m)    Patient is a frail looking female who was laying comfortably bed.  Not in any acute distress.  She looks well-hydrated HEENT: Not pale not jaundiced Neck: Supple with no JVD Chest wall significant for right sided port a catheter.  Diminished breath sounds at the base of the lungs.  No added sounds however Abdomen: Soft nontender Extremities without pedal edema. CNS shows no focal deficit. Data Reviewed:  Labs show sodium 132, potassium 4.2, chloride 107, bicarb 19, glucose 531, BUN 29,  creatinine 1.58, calcium 8.5 Hemoglobin is 7.8, hematocrit 23, WBC 7.2, platelet count 103    Assessment and Plan: 68 year old female with history of small cell lung cancer receiving his second round of chemotherapy.  Patient also has history of type 2 diabetes mellitus on insulin .  She presented on account of hyperglycemia after her round of chemotherapy.  Blood glucose were in the 700s.  Beta-hydroxybutyrate however were unremarkable.  Patient was admitted on account of hyperglycemic crisis  Hyperglycemic crisis: Patient with known history of type 2 diabetes mellitus on insulin .  She reports glucose was 141 last night.  Unclear if chemotherapy formulations did have any dextrose  or effects.  Patient was given lispro in the ED.  Will restart her insulin  glargine  at 20 units.  Insulin  sliding scale has been instituted.  Will monitor fingerstick glucose as per protocol through the night.    Pseudohyponatremia: Likely due to hyperglycemia.  Hyponatremia will correct with correction of hyperglycemia.  Acute on chronic anemia: Denies any bleeding diathesis.  Will cautiously monitor hemoglobin.  Transfuse if hemoglobin less than 7.  Pancytopenia: Likely due to chemotherapy.  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  Received dose of treatment today.  Recent history of C. difficile: Patient was treated with Flagyl and vancomycin  at [facility].  History of liver transplant: Patient is chronically on tacrolimus    Advance Care Planning:   Code Status: Full Code   Consults: None.  Family Communication: No family at bedside this time  Severity of Illness: The appropriate patient status for this patient is OBSERVATION. Observation status is judged to be reasonable and necessary in order to provide the required intensity of service to ensure the patient's safety. The patient's presenting symptoms, physical exam findings, and initial  radiographic and laboratory data in the context of their medical condition is felt to place them at decreased risk for further clinical deterioration. Furthermore, it is anticipated that the patient will be medically stable for discharge from the hospital within 2 midnights of admission.   Author: Theodora Fish, MD 02/17/2024 10:46 PM  For on call review [email or web address].

## 2024-02-17 NOTE — Progress Notes (Signed)
 CRITICAL VALUE STICKER  CRITICAL VALUE: blood sugar 848  RECEIVER (on-site recipient of call): Izella Marshal, RN  DATE & TIME NOTIFIED: 02/17/24 1314  MESSENGER (representative from lab):  Heather  MD NOTIFIED: Dr. Marguerita Shih and Dr. Elnita Hai  TIME OF NOTIFICATION: 1345  RESPONSE: Per Dr. Elnita Hai- complete her current chemotherapy (the chemotherapy currently running which is carboplatin ) and then take the patient to the ER. Dr. Marguerita Shih is in agreement about taking patient to the ER.

## 2024-02-17 NOTE — Progress Notes (Signed)
 Patient recheck of blood sugar came back hi- greater than 600. Dr. Marguerita Shih contacted.

## 2024-02-18 ENCOUNTER — Ambulatory Visit

## 2024-02-18 DIAGNOSIS — E1165 Type 2 diabetes mellitus with hyperglycemia: Secondary | ICD-10-CM | POA: Diagnosis not present

## 2024-02-18 LAB — CBC
HCT: 23.5 % — ABNORMAL LOW (ref 36.0–46.0)
Hemoglobin: 7.4 g/dL — ABNORMAL LOW (ref 12.0–15.0)
MCH: 28.4 pg (ref 26.0–34.0)
MCHC: 31.5 g/dL (ref 30.0–36.0)
MCV: 90 fL (ref 80.0–100.0)
Platelets: 110 10*3/uL — ABNORMAL LOW (ref 150–400)
RBC: 2.61 MIL/uL — ABNORMAL LOW (ref 3.87–5.11)
RDW: 16.8 % — ABNORMAL HIGH (ref 11.5–15.5)
WBC: 7.5 10*3/uL (ref 4.0–10.5)
nRBC: 0 % (ref 0.0–0.2)

## 2024-02-18 LAB — BASIC METABOLIC PANEL WITH GFR
Anion gap: 6 (ref 5–15)
Anion gap: 9 (ref 5–15)
BUN: 31 mg/dL — ABNORMAL HIGH (ref 8–23)
BUN: 32 mg/dL — ABNORMAL HIGH (ref 8–23)
CO2: 20 mmol/L — ABNORMAL LOW (ref 22–32)
CO2: 18 mmol/L — ABNORMAL LOW (ref 22–32)
Calcium: 8.3 mg/dL — ABNORMAL LOW (ref 8.9–10.3)
Calcium: 8.7 mg/dL — ABNORMAL LOW (ref 8.9–10.3)
Chloride: 105 mmol/L (ref 98–111)
Chloride: 103 mmol/L (ref 98–111)
Creatinine, Ser: 1.58 mg/dL — ABNORMAL HIGH (ref 0.44–1.00)
Creatinine, Ser: 1.65 mg/dL — ABNORMAL HIGH (ref 0.44–1.00)
GFR, Estimated: 35 mL/min — ABNORMAL LOW (ref >60)
GFR, Estimated: 34 mL/min — ABNORMAL LOW (ref >60)
Glucose, Bld: 491 mg/dL — ABNORMAL HIGH (ref 70–99)
Glucose, Bld: 486 mg/dL — ABNORMAL HIGH (ref 70–99)
Potassium: 5.1 mmol/L (ref 3.5–5.1)
Potassium: 4.7 mmol/L (ref 3.5–5.1)
Sodium: 131 mmol/L — ABNORMAL LOW (ref 135–145)
Sodium: 130 mmol/L — ABNORMAL LOW (ref 135–145)

## 2024-02-18 LAB — GLUCOSE, CAPILLARY
Glucose-Capillary: 508 mg/dL (ref 70–99)
Glucose-Capillary: 463 mg/dL — ABNORMAL HIGH (ref 70–99)
Glucose-Capillary: 289 mg/dL — ABNORMAL HIGH (ref 70–99)
Glucose-Capillary: 329 mg/dL — ABNORMAL HIGH (ref 70–99)

## 2024-02-18 LAB — HEMOGLOBIN A1C
Hgb A1c MFr Bld: 8 % — ABNORMAL HIGH (ref 4.8–5.6)
Mean Plasma Glucose: 182.9 mg/dL

## 2024-02-18 LAB — CBG MONITORING, ED: Glucose-Capillary: 463 mg/dL — ABNORMAL HIGH (ref 70–99)

## 2024-02-18 LAB — BETA-HYDROXYBUTYRIC ACID: Beta-Hydroxybutyric Acid: 0.33 mmol/L — ABNORMAL HIGH (ref 0.05–0.27)

## 2024-02-18 LAB — HIV ANTIBODY (ROUTINE TESTING W REFLEX): HIV Screen 4th Generation wRfx: NONREACTIVE

## 2024-02-18 MED ORDER — INSULIN ASPART 100 UNIT/ML IJ SOLN
3.0000 [IU] | Freq: Three times a day (TID) | INTRAMUSCULAR | Status: DC
  Administered 2024-02-18 (×2): 3 [IU] via SUBCUTANEOUS
  Filled 2024-02-18: qty 0.03

## 2024-02-18 MED ORDER — INSULIN ASPART 100 UNIT/ML IJ SOLN
0.0000 [IU] | Freq: Three times a day (TID) | INTRAMUSCULAR | Status: DC
  Administered 2024-02-18: 11 [IU] via SUBCUTANEOUS
  Administered 2024-02-18: 20 [IU] via SUBCUTANEOUS
  Administered 2024-02-19: 4 [IU] via SUBCUTANEOUS
  Administered 2024-02-19: 11 [IU] via SUBCUTANEOUS
  Administered 2024-02-19 – 2024-02-20 (×2): 4 [IU] via SUBCUTANEOUS

## 2024-02-18 MED ORDER — INSULIN ASPART 100 UNIT/ML IJ SOLN
0.0000 [IU] | Freq: Every day | INTRAMUSCULAR | Status: DC
  Administered 2024-02-18: 4 [IU] via SUBCUTANEOUS

## 2024-02-18 MED ORDER — DAPAGLIFLOZIN PROPANEDIOL 10 MG PO TABS
10.0000 mg | ORAL_TABLET | Freq: Every day | ORAL | Status: DC
  Administered 2024-02-18 – 2024-02-20 (×3): 10 mg via ORAL
  Filled 2024-02-18 (×4): qty 1

## 2024-02-18 NOTE — Progress Notes (Addendum)
 PROGRESS NOTE    Kathryn Walsh  ZOX:[id number] DOB: Jan 18, 1956 DOA: 02/17/2024 PCP: [name], MD  Chief Complaint  Patient presents with   Hyperglycemia    Brief Narrative:   Kathryn Walsh is Kathryn Walsh 68 y.o. female with medical history significant of diabetes mellitus on insulin , hyperlipidemia, CKD, hypertension, history of liver transplant on tacrolimus  (imuran  recently discontinued in March).  Patient also has new diagnosis of lung cancer and currently receiving chemotherapy.  She received her second round of chemotherapy on the day of admission and was noted to have elevated blood glucose in the 700s.    Admitted for management of hyperglycemia.  Assessment & Plan:   Principal Problem:   Hyperglycemia due to diabetes mellitus (HCC)  Hyperosmolar Hyperglycemic State Serum osm measured 328 at presentation.  Mild NAGMA. BG at arrival to cancer center was 548 (see 10 am note from [place])  Unclear precipitating cause, she takes 10 units tresiba  at home nightly (this dose was reduced at her recent admission to [facility] - plan at that time was tresiba  10 units and lispro 8 units with meals only with chemotherapy). Appears she also takes farxiga  at home.  She has short acting insulin  on med list, but isn't taking this.  A1c is 8 (doesn't reflect degree of hyperglycemia, suspect underestimate due to recent transfusion in March? Vs recent worsening of hyperglycemia - notably MRI 11/2023 with normal pancreas) Unclear if any recent steroids, will discuss with pharmacy to see if they can help clarify - she received 10 units of dex yesterday at 4/21 Continue tresiba , high dose ssi, and add mealtime.  Continue farxiga .   Metastatic Small Cell Lung Cancer Following with Dr. Marguerita Shih  - chemo with carboplatin  and etoposide   AKI on CKD IIIa Unclear baseline, appears to be 1.3 based on care everywhere labs  Pancytopenia Noted, due to malignancy, chemo   Atrial Fibrillation Noted on  recent [facility] admission in march Not currently on anticoagulation Was started on diltiazem at [facility] admission, currently rate controlled (was supposed to stop amlodipine  and losartan  and start diltiazem - for now, will continue amlodipine  and losartan  - could add metop)  Hyponatremia Related to hyperglycemia  Hx C Diff Noted, no current sx (developed after chemo, treated)  S/p Liver Transplant On tacrolimus  (imuran  recently discontinued in setting of cancer directed therapy) Tac was decreased from 2 mg BID to 1 mg BID at recent [facility] admission in march Will discuss whether need tac level with pharmacy (saw transplant 4/16, see care everywhere -> tac level 4.2 - "hard to push tac in this setting" with creatinine 1.8 they'd planned for repeat labs in 1 month)  Hypertension amlodipine  Losartan   Dyslipidemia Pravastatin    Chronic Pain PDMP recently filled hydromorphine 4 mg tablets #40 pills (enough for 20 days) on 01/17/2024 - unclear if she has any of these left See PDMP regarding other meds (morphine  sulfate IR filled on 3/16 - enough for 15 days) and oxycodone  filled on 12/30/2023 enough for 10 days All different prescribers - for now, sill continue oxy prn here  See care everywhere discharge summary from 3/12 for information regarding discharge instructions from March admission for delirium and neutropenic fever.     DVT prophylaxis: hepariin  Code Status: full Family Communication: none Disposition:   Status is: Observation The patient remains OBS appropriate and will d/c before 2 midnights.   Consultants:  none  Procedures:  none  Antimicrobials:  Anti-infectives (From admission, onward)    None  Subjective: Wants something to eat  Objective: Vitals:   02/17/24 2313 02/18/24 0200 02/18/24 0500 02/18/24 0800  BP:  (!) 144/81 (!) 144/77 (!) 156/71  Pulse:  72 77 93  Resp:  15 17 17   Temp: 98.2 F (36.8 C) 98 F (36.7 C) 98.2 F (36.8 C)   TempSrc: Oral  Oral    SpO2:  95% 96% 99%  Weight:      Height:        Intake/Output Summary (Last 24 hours) at 02/18/2024 0902 Last data filed at 02/17/2024 1905 Gross per 24 hour  Intake 2000 ml  Output --  Net 2000 ml   Filed Weights   02/17/24 1701  Weight: 66.7 kg    Examination:  General exam: Appears calm and comfortable  Respiratory system: unlabored Cardiovascular system: RRR Gastrointestinal system: Abdomen is nondistended, soft and nontender.  Central nervous system: Alert and oriented. No focal neurological deficits. Extremities: no LEE   Data Reviewed: I have personally reviewed following labs and imaging studies  CBC: Recent Labs  Lab 02/17/24 0937 02/17/24 1444 02/17/24 1540 02/18/24 0556  WBC 8.7 7.2  --  7.5  NEUTROABS 7.3 6.9  --   --   HGB 7.7* 7.2* 7.8* 7.4*  HCT 24.5* 23.9* 23.0* 23.5*  MCV 86.9 93.7  --  90.0  PLT 124* 103*  --  110*    Basic Metabolic Panel: Recent Labs  Lab 02/17/24 1444 02/17/24 1540 02/17/24 1910 02/17/24 2245 02/18/24 0242 02/18/24 0556  NA 128* 132* 132* 135 131* 130*  K 4.7 4.9 4.7 4.9 5.1 4.7  CL 103 104 107 108 105 103  CO2 18*  --  19* 21* 20* 18*  GLUCOSE 754* >700* 531* 490* 491* 486*  BUN 31* 26* 29* 29* 31* 32*  CREATININE 1.73* 1.70* 1.58* 1.73* 1.58* 1.65*  CALCIUM 8.3*  --  8.5* 8.5* 8.3* 8.7*  MG 1.7  --   --   --   --   --     GFR: Estimated Creatinine Clearance: 31.7 mL/min ([name]) (by C-G formula based on SCr of 1.65 mg/dL (H)).  Liver Function Tests: Recent Labs  Lab 02/17/24 0937  AST 19  ALT 26  ALKPHOS 472*  BILITOT 0.8  PROT 8.1  ALBUMIN 2.9*    CBG: Recent Labs  Lab 02/17/24 1902 02/17/24 2022 02/17/24 2115 02/17/24 2253 02/18/24 0822  GLUCAP 437* 440* 390* 442* 463*     No results found for this or any previous visit (from the past 240 hours).       Radiology Studies: No results found.      Scheduled Meds:  amLODipine   5 mg Oral BID   azaTHIOprine   100 mg Oral BID    cholecalciferol   4,000 Units Oral Daily   gabapentin   300 mg Oral QHS   heparin   5,000 Units Subcutaneous Q8H   insulin  aspart  0-15 Units Subcutaneous TID WC   insulin  aspart  3 Units Subcutaneous TID WC   insulin  glargine-yfgn  20 Units Subcutaneous QHS   losartan   25 mg Oral Daily   pravastatin   40 mg Oral Daily   tacrolimus   1 mg Oral BID   Continuous Infusions:  sodium chloride        LOS: 0 days    Time spent: over 30 min    Donnetta Gains, MD [facility]   To contact the attending provider between 7A-7P or the covering provider during after hours 7P-7A, please log into the web  site [email or web address] and access using universal [place] password for that web site. If you do not have the password, please call the hospital operator.  02/18/2024, 9:02 AM

## 2024-02-18 NOTE — Plan of Care (Signed)

## 2024-02-18 NOTE — Inpatient Diabetes Management (Signed)
 Inpatient Diabetes Program Recommendations  AACE/ADA: New Consensus Statement on Inpatient Glycemic Control (2015)  Target Ranges:  Prepandial:   less than 140 mg/dL      Peak postprandial:   less than 180 mg/dL (1-2 hours)      Critically ill patients:  140 - 180 mg/dL   Lab Results  Component Value Date   GLUCAP 463 (H) 02/18/2024   HGBA1C 8.0 (H) 02/18/2024    Review of Glycemic Control  Latest Reference Range & Units 02/17/24 15:30 02/17/24 19:02 02/17/24 20:22 02/17/24 21:15 02/17/24 22:53 02/18/24 08:22  Glucose-Capillary 70 - 99 mg/dL >782 (HH) 956 (H) 213 (H) 390 (H) 442 (H) 463 (H)  (HH): Data is critically high (H): Data is abnormally high Diabetes history: Type 2 DM Outpatient Diabetes medications: Tresiba  25 units every day, Novolog  6 units TID, Farxiga  10 mg QD Current orders for Inpatient glycemic control: Semglee  20 units at bedtime, Novolog  3 units TID, Novolog  0-20 units TID & HS Decadron  10 mg x 1  Inpatient Diabetes Program Recommendations:    Consider further increasing Semglee  to 28 units at bedtime.  Thanks, Marjo Sievert, MSN, RNC-OB Diabetes Coordinator [phone] (8a-5p)

## 2024-02-19 ENCOUNTER — Ambulatory Visit

## 2024-02-19 DIAGNOSIS — E1165 Type 2 diabetes mellitus with hyperglycemia: Secondary | ICD-10-CM | POA: Diagnosis not present

## 2024-02-19 LAB — COMPREHENSIVE METABOLIC PANEL WITH GFR
ALT: 32 U/L (ref 0–44)
AST: 38 U/L (ref 15–41)
Albumin: 2.1 g/dL — ABNORMAL LOW (ref 3.5–5.0)
Alkaline Phosphatase: 405 U/L — ABNORMAL HIGH (ref 38–126)
Anion gap: 7 (ref 5–15)
BUN: 39 mg/dL — ABNORMAL HIGH (ref 8–23)
CO2: 20 mmol/L — ABNORMAL LOW (ref 22–32)
Calcium: 8.5 mg/dL — ABNORMAL LOW (ref 8.9–10.3)
Chloride: 109 mmol/L (ref 98–111)
Creatinine, Ser: 1.67 mg/dL — ABNORMAL HIGH (ref 0.44–1.00)
GFR, Estimated: 33 mL/min — ABNORMAL LOW (ref >60)
Glucose, Bld: 297 mg/dL — ABNORMAL HIGH (ref 70–99)
Potassium: 4.6 mmol/L (ref 3.5–5.1)
Sodium: 136 mmol/L (ref 135–145)
Total Bilirubin: 0.8 mg/dL (ref 0.0–1.2)
Total Protein: 6.8 g/dL (ref 6.5–8.1)

## 2024-02-19 LAB — GLUCOSE, CAPILLARY
Glucose-Capillary: 255 mg/dL — ABNORMAL HIGH (ref 70–99)
Glucose-Capillary: 195 mg/dL — ABNORMAL HIGH (ref 70–99)
Glucose-Capillary: 168 mg/dL — ABNORMAL HIGH (ref 70–99)
Glucose-Capillary: 186 mg/dL — ABNORMAL HIGH (ref 70–99)

## 2024-02-19 LAB — CBC WITH DIFFERENTIAL/PLATELET
Abs Immature Granulocytes: 0.03 10*3/uL (ref 0.00–0.07)
Basophils Absolute: 0 10*3/uL (ref 0.0–0.1)
Basophils Relative: 0 %
Eosinophils Absolute: 0 10*3/uL (ref 0.0–0.5)
Eosinophils Relative: 0 %
HCT: 22.3 % — ABNORMAL LOW (ref 36.0–46.0)
Hemoglobin: 7 g/dL — ABNORMAL LOW (ref 12.0–15.0)
Immature Granulocytes: 0 %
Lymphocytes Relative: 7 %
Lymphs Abs: 0.5 10*3/uL — ABNORMAL LOW (ref 0.7–4.0)
MCH: 28.3 pg (ref 26.0–34.0)
MCHC: 31.4 g/dL (ref 30.0–36.0)
MCV: 90.3 fL (ref 80.0–100.0)
Monocytes Absolute: 0.5 10*3/uL (ref 0.1–1.0)
Monocytes Relative: 6 %
Neutro Abs: 6.5 10*3/uL (ref 1.7–7.7)
Neutrophils Relative %: 87 %
Platelets: 100 10*3/uL — ABNORMAL LOW (ref 150–400)
RBC: 2.47 MIL/uL — ABNORMAL LOW (ref 3.87–5.11)
RDW: 17.2 % — ABNORMAL HIGH (ref 11.5–15.5)
WBC: 7.5 10*3/uL (ref 4.0–10.5)
nRBC: 0 % (ref 0.0–0.2)

## 2024-02-19 LAB — MAGNESIUM: Magnesium: 1.6 mg/dL — ABNORMAL LOW (ref 1.7–2.4)

## 2024-02-19 LAB — PHOSPHORUS: Phosphorus: 3.6 mg/dL (ref 2.5–4.6)

## 2024-02-19 MED ORDER — IPRATROPIUM-ALBUTEROL 0.5-2.5 (3) MG/3ML IN SOLN: 3.0000 mL | RESPIRATORY_TRACT | Status: DC | PRN

## 2024-02-19 MED ORDER — CHLORHEXIDINE GLUCONATE CLOTH 2 % EX PADS
6.0000 | MEDICATED_PAD | Freq: Every day | CUTANEOUS | Status: DC
  Administered 2024-02-19 – 2024-02-20 (×2): 6 via TOPICAL

## 2024-02-19 MED ORDER — METOPROLOL TARTRATE 5 MG/5ML IV SOLN: 5.0000 mg | INTRAVENOUS | Status: DC | PRN

## 2024-02-19 MED ORDER — SODIUM CHLORIDE 0.9% FLUSH: 10.0000 mL | INTRAVENOUS | Status: DC | PRN

## 2024-02-19 MED ORDER — INSULIN GLARGINE-YFGN 100 UNIT/ML ~~LOC~~ SOLN
28.0000 [IU] | Freq: Every day | SUBCUTANEOUS | Status: DC
  Administered 2024-02-19: 28 [IU] via SUBCUTANEOUS
  Filled 2024-02-19 (×2): qty 0.28

## 2024-02-19 MED ORDER — HYDRALAZINE HCL 20 MG/ML IJ SOLN: 10.0000 mg | INTRAMUSCULAR | Status: DC | PRN

## 2024-02-19 MED ORDER — MAGNESIUM OXIDE -MG SUPPLEMENT 400 (240 MG) MG PO TABS
800.0000 mg | ORAL_TABLET | Freq: Once | ORAL | Status: AC
  Administered 2024-02-19: 800 mg via ORAL
  Filled 2024-02-19: qty 2

## 2024-02-19 NOTE — Progress Notes (Signed)
 PROGRESS NOTE    Kathryn Walsh  ZOX:[id number] DOB: Apr 11, 1956 DOA: 02/17/2024 PCP: [name], MD    Brief Narrative:    68 y.o. female with medical history significant of diabetes mellitus on insulin , hyperlipidemia, CKD, hypertension, history of liver transplant on tacrolimus  (imuran  recently discontinued in March).  Patient also has new diagnosis of lung cancer and currently receiving chemotherapy.  She received her second round of chemotherapy on the day of admission and was noted to have elevated blood glucose in the 700s.    Admitted for management of hyperglycemia.  Assessment & Plan:  Principal Problem:   Hyperglycemia due to diabetes mellitus (HCC)     Hyperosmolar hyperglycemic state -Supposed to be on outpatient diabetic regimen of Tresiba  25 units daily, NovoLog  6 units 3 times daily and Farxiga  10 mg daily.  Slowly hyperglycemia is improving in the hospital. - Will increase Semglee  28 units at bedtime, continue 3 units Premeal, sliding scale and Farxiga  10 mg daily.    Metastatic Small Cell Lung Cancer Following with Dr. Marguerita Shih  - chemo with carboplatin  and etoposide    AKI on CKD IIIa -Baseline creatinine 1.3, admission creatinine 1.7.  Slowly improving   Pancytopenia Noted, due to malignancy, chemo.  Hemoglobin 7.0 today  Atrial Fibrillation, paroxysmal -Recently diagnosed at [facility].  Not on anticoagulation.   Hyponatremia Related to hyperglycemia   Hx C Diff Noted, no current sx (developed after chemo, treated)   S/p Liver Transplant >20 yrs ago Prior hx of Hep C treated.  On tacrolimus  (imuran  recently discontinued in setting of cancer directed therapy) Tac was decreased from 2 mg BID to 1 mg BID at recent [facility] admission in march Will discuss whether need tac level with pharmacy (saw transplant 4/16, see care everywhere -> tac level 4.2 - "hard to push tac in this setting" with creatinine 1.8 they'd planned for repeat labs in 1 month)    Hypertension amlodipine  Losartan   Dyslipidemia Pravastatin     Chronic Pain PDMP recently filled hydromorphine 4 mg tablets #40 pills (enough for 20 days) on 01/17/2024 - unclear if she has any of these left See PDMP regarding other meds (morphine  sulfate IR filled on 3/16 - enough for 15 days) and oxycodone  filled on 12/30/2023 enough for 10 days All different prescribers - for now, sill continue oxy prn here  DVT prophylaxis: heparin  injection 5,000 Units Start: 02/18/24 0600 SCDs Start: 02/17/24 2243      Code Status: Full Code Family Communication:   Hopefully discharge tomorrow   Subjective:  Feeling well, blood sugar still elevated  Examination:  General exam: Appears calm and comfortable  Respiratory system: Clear to auscultation. Respiratory effort normal. Cardiovascular system: S1 & S2 heard, RRR. No JVD, murmurs, rubs, gallops or clicks. No pedal edema. Gastrointestinal system: Abdomen is nondistended, soft and nontender. No organomegaly or masses felt. Normal bowel sounds heard. Central nervous system: Alert and oriented. No focal neurological deficits. Extremities: Symmetric 5 x 5 power. Skin: No rashes, lesions or ulcers Psychiatry: Judgement and insight appear normal. Mood & affect appropriate.                Diet Orders (From admission, onward)     Start     Ordered   02/17/24 2242  Diet Carb Modified Fluid consistency: Thin; Room service appropriate? Yes  Diet effective now       Question Answer Comment  Diet-HS Snack? Nothing   Calorie Level Medium 1600-2000   Fluid consistency: Thin   Room service  appropriate? Yes      02/17/24 2246            Objective: Vitals:   02/18/24 0917 02/18/24 1319 02/18/24 1725 02/19/24 0428  BP: (!) 146/78 (!) 142/74 136/74 133/65  Pulse: 87 77 84 78  Resp: 16 19 20 18   Temp: 98.4 F (36.9 C) 98.6 F (37 C) 98.3 F (36.8 C) 98.6 F (37 C)  TempSrc: Oral Oral Oral Oral  SpO2: 100% 98% 95% 99%   Weight:      Height:        Intake/Output Summary (Last 24 hours) at 02/19/2024 1144 Last data filed at 02/19/2024 0900 Gross per 24 hour  Intake 986.45 ml  Output --  Net 986.45 ml   Filed Weights   02/17/24 1701  Weight: 66.7 kg    Scheduled Meds:  amLODipine   5 mg Oral BID   Chlorhexidine  Gluconate Cloth  6 each Topical Daily   cholecalciferol   4,000 Units Oral Daily   dapagliflozin  propanediol  10 mg Oral Daily   gabapentin   300 mg Oral QHS   heparin   5,000 Units Subcutaneous Q8H   insulin  aspart  0-20 Units Subcutaneous TID WC   insulin  aspart  0-5 Units Subcutaneous QHS   insulin  aspart  3 Units Subcutaneous TID WC   insulin  glargine-yfgn  28 Units Subcutaneous QHS   losartan   25 mg Oral Daily   pravastatin   40 mg Oral Daily   tacrolimus   1 mg Oral BID   Continuous Infusions:  Nutritional status     Body mass index is 23.02 kg/m.  Data Reviewed:   CBC: Recent Labs  Lab 02/17/24 0937 02/17/24 1444 02/17/24 1540 02/18/24 0556 02/19/24 0518  WBC 8.7 7.2  --  7.5 7.5  NEUTROABS 7.3 6.9  --   --  6.5  HGB 7.7* 7.2* 7.8* 7.4* 7.0*  HCT 24.5* 23.9* 23.0* 23.5* 22.3*  MCV 86.9 93.7  --  90.0 90.3  PLT 124* 103*  --  110* 100*   Basic Metabolic Panel: Recent Labs  Lab 02/17/24 1444 02/17/24 1540 02/17/24 1910 02/17/24 2245 02/18/24 0242 02/18/24 0556 02/19/24 0518  NA 128*   < > 132* 135 131* 130* 136  K 4.7   < > 4.7 4.9 5.1 4.7 4.6  CL 103   < > 107 108 105 103 109  CO2 18*  --  19* 21* 20* 18* 20*  GLUCOSE 754*   < > 531* 490* 491* 486* 297*  BUN 31*   < > 29* 29* 31* 32* 39*  CREATININE 1.73*   < > 1.58* 1.73* 1.58* 1.65* 1.67*  CALCIUM 8.3*  --  8.5* 8.5* 8.3* 8.7* 8.5*  MG 1.7  --   --   --   --   --  1.6*  PHOS  --   --   --   --   --   --  3.6   < > = values in this interval not displayed.   GFR: Estimated Creatinine Clearance: 31.4 mL/min (A) (by C-G formula based on SCr of 1.67 mg/dL (H)). Liver Function Tests: Recent Labs  Lab  02/17/24 0937 02/19/24 0518  AST 19 38  ALT 26 32  ALKPHOS 472* 405*  BILITOT 0.8 0.8  PROT 8.1 6.8  ALBUMIN 2.9* 2.1*   No results for input(s): "LIPASE", "AMYLASE" in the last 168 hours. No results for input(s): "AMMONIA" in the last 168 hours. Coagulation Profile: No results for input(s): "INR", "PROTIME" in  the last 168 hours. Cardiac Enzymes: No results for input(s): "CKTOTAL", "CKMB", "CKMBINDEX", "TROPONINI" in the last 168 hours. BNP (last 3 results) No results for input(s): "PROBNP" in the last 8760 hours. HbA1C: Recent Labs    02/18/24 0242  HGBA1C 8.0*   CBG: Recent Labs  Lab 02/18/24 1219 02/18/24 1408 02/18/24 1644 02/18/24 2011 02/19/24 0727  GLUCAP 508* 463* 289* 329* 255*   Lipid Profile: No results for input(s): "CHOL", "HDL", "LDLCALC", "TRIG", "CHOLHDL", "LDLDIRECT" in the last 72 hours. Thyroid  Function Tests: No results for input(s): "TSH", "T4TOTAL", "FREET4", "T3FREE", "THYROIDAB" in the last 72 hours. Anemia Panel: No results for input(s): "VITAMINB12", "FOLATE", "FERRITIN", "TIBC", "IRON", "RETICCTPCT" in the last 72 hours. Sepsis Labs: No results for input(s): "PROCALCITON", "LATICACIDVEN" in the last 168 hours.  No results found for this or any previous visit (from the past 240 hours).       Radiology Studies: No results found.         LOS: 0 days   Time spent= 35 mins    Maggie Schooner, MD [facility]  If 7PM-7AM, please contact night-coverage  02/19/2024, 11:44 AM

## 2024-02-19 NOTE — Plan of Care (Signed)

## 2024-02-19 NOTE — TOC Initial Note (Signed)
 Transition of Care [facility]) - Initial/Assessment Note    Patient Details  Name: Kathryn Walsh MRN: [id number] Date of Birth: [birth year]  [facility]) CM/SW Contact:    Kathryn Parish, RN Phone Number: 02/19/2024, 2:58 PM  Clinical Narrative:                 Presented for hyperglycemia from home where she lives with her daughter. Patient states she still drives; PCP and insurance verified; No SDOH, DME, HH or oxygen. Transportation home at discharge will be sister Stana Ear [phone]). No TOC needs identify during visit. TOC signing off. If needs present please consult.    Barriers to Discharge: No Barriers Identified   Patient Goals and CMS Choice Patient states their goals for this hospitalization and ongoing recovery are:: Return home          Expected Discharge Plan and Services In-house Referral: NA Discharge Planning Services: NA   Living arrangements for the past 2 months: Single Family Home                 DME Arranged: N/A DME Agency: NA       HH Arranged: NA HH Agency: NA        Prior Living Arrangements/Services Living arrangements for the past 2 months: Single Family Home Lives with:: Adult Children Patient language and need for interpreter reviewed:: Yes Do you feel safe going back to the place where you live?: Yes      Need for Family Participation in Patient Care: No (Comment) Care giver support system in place?: Yes (comment)   Criminal Activity/Legal Involvement Pertinent to Current Situation/Hospitalization: No - Comment as needed  Activities of Daily Living   ADL Screening (condition at time of admission) Independently performs ADLs?: Yes (appropriate for developmental age) Is the patient deaf or have difficulty hearing?: No Does the patient have difficulty seeing, even when wearing glasses/contacts?: No Does the patient have difficulty concentrating, remembering, or making decisions?: No  Permission  Sought/Granted Permission sought to share information with : Case Manager                Emotional Assessment Appearance:: Appears stated age Attitude/Demeanor/Rapport: Engaged Affect (typically observed): Accepting, Appropriate Orientation: : Oriented to Self, Oriented to Place, Oriented to  Time, Oriented to Situation Alcohol / Substance Use: Not Applicable Psych Involvement: No (comment)  Admission diagnosis:  Hyperglycemia [R73.9] Anemia, unspecified type [D64.9] Hyperglycemia due to diabetes mellitus (HCC) [E11.65] Patient Active Problem List   Diagnosis Date Noted   Port-A-Cath in place 02/17/2024   Hyperglycemia due to diabetes mellitus (HCC) 02/17/2024   Primary small cell carcinoma of right lung (HCC) 02/10/2024   Estrogen deficiency 11/09/2023   Thrombocytopenia (HCC) 11/09/2023   Moderate tobacco use disorder 11/09/2023   Positive for microalbuminuria 07/11/2023   Annual physical exam 07/08/2023   Hyperparathyroidism (HCC) 02/27/2023   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   Stage 3a chronic kidney disease (HCC) 08/07/2022   Cigarette nicotine dependence without complication 04/20/2021   Pap smear for cervical cancer screening 01/04/2014   Chronic pain syndrome 01/04/2014   Type 2 diabetes mellitus with stage 3a chronic kidney disease, with long-term current use of insulin  (HCC) 10/06/2013   Depression 10/06/2013   Hyperglycemia 07/31/2013   Cellulitis and abscess 07/30/2013   Immunosuppressed status (HCC) 07/30/2013   Essential hypertension 07/22/2013   S/P liver transplant (HCC) 07/11/2013   HCV (hepatitis C virus) 07/11/2013   Type II diabetes mellitus, uncontrolled 07/11/2013   PCP:  [name], MD Pharmacy:   [facility] [phone] [place], [place] - [phone] AT [facility] [address] [place] [address] Phone: [phone] Fax: [phone]  [facility] [phone] - [place], [place] - [address] [place] [address] Phone: [phone] Fax: [phone]     Social Drivers of Health (SDOH) Social History: SDOH Screenings   Food Insecurity: No Food Insecurity (02/18/2024)  Housing: Low Risk  (02/18/2024)  Transportation Needs: No Transportation Needs (02/18/2024)  Utilities: Not At Risk (02/18/2024)  Alcohol Screen: Low Risk  (10/02/2023)  Depression (PHQ2-9): Low Risk  (11/07/2023)  Financial Resource Strain: Low Risk  (01/08/2024)   Received from [facility]  Physical Activity: Inactive (10/02/2023)  Social Connections: Socially Isolated (02/18/2024)  Stress: No Stress Concern Present (10/02/2023)  Tobacco Use: Medium Risk (02/17/2024)  Health Literacy: Adequate Health Literacy (10/02/2023)   SDOH Interventions:     Readmission Risk Interventions     No data to display

## 2024-02-19 NOTE — Plan of Care (Signed)

## 2024-02-19 NOTE — Hospital Course (Addendum)
 Brief Narrative:    68 y.o. female with medical history significant of diabetes mellitus on insulin , hyperlipidemia, CKD, hypertension, history of liver transplant on tacrolimus  (imuran  recently discontinued in March).  Patient also has new diagnosis of lung cancer and currently receiving chemotherapy.  She received her second round of chemotherapy on the day of admission and was noted to have elevated blood glucose in the 700s.    Admitted for management of hyperglycemia.  Assessment & Plan:  Principal Problem:   Hyperglycemia due to diabetes mellitus (HCC)     Hyperosmolar hyperglycemic state -Supposed to be on outpatient diabetic regimen of Tresiba  25 units daily, NovoLog  6 units 3 times daily and Farxiga  10 mg daily.  Slowly hyperglycemia is improving in the hospital. - Will increase Semglee  28 units at bedtime, continue 3 units Premeal, sliding scale and Farxiga  10 mg daily.    Metastatic Small Cell Lung Cancer Following with Dr. Marguerita Shih  - chemo with carboplatin  and etoposide    AKI on CKD IIIa -Baseline creatinine 1.3, admission creatinine 1.7.  Slowly improving   Pancytopenia Noted, due to malignancy, chemo.  Hemoglobin 7.0 today  Atrial Fibrillation, paroxysmal -Recently diagnosed at [facility].  Not on anticoagulation.   Hyponatremia Related to hyperglycemia   Hx C Diff Noted, no current sx (developed after chemo, treated)   S/p Liver Transplant >20 yrs ago Prior hx of Hep C treated.  On tacrolimus  (imuran  recently discontinued in setting of cancer directed therapy) Tac was decreased from 2 mg BID to 1 mg BID at recent [facility] admission in march Will discuss whether need tac level with pharmacy (saw transplant 4/16, see care everywhere -> tac level 4.2 - "hard to push tac in this setting" with creatinine 1.8 they'd planned for repeat labs in 1 month)   Hypertension amlodipine  Losartan   Dyslipidemia Pravastatin     Chronic Pain PDMP recently filled hydromorphine 4 mg  tablets #40 pills (enough for 20 days) on 01/17/2024 - unclear if she has any of these left See PDMP regarding other meds (morphine  sulfate IR filled on 3/16 - enough for 15 days) and oxycodone  filled on 12/30/2023 enough for 10 days All different prescribers - for now, sill continue oxy prn here  DVT prophylaxis: heparin  injection 5,000 Units Start: 02/18/24 0600 SCDs Start: 02/17/24 2243      Code Status: Full Code Family Communication:   Hopefully discharge tomorrow   Subjective:  Feeling well, blood sugar still elevated  Examination:  General exam: Appears calm and comfortable  Respiratory system: Clear to auscultation. Respiratory effort normal. Cardiovascular system: S1 & S2 heard, RRR. No JVD, murmurs, rubs, gallops or clicks. No pedal edema. Gastrointestinal system: Abdomen is nondistended, soft and nontender. No organomegaly or masses felt. Normal bowel sounds heard. Central nervous system: Alert and oriented. No focal neurological deficits. Extremities: Symmetric 5 x 5 power. Skin: No rashes, lesions or ulcers Psychiatry: Judgement and insight appear normal. Mood & affect appropriate.

## 2024-02-20 ENCOUNTER — Encounter: Payer: Self-pay | Admitting: Internal Medicine

## 2024-02-20 ENCOUNTER — Other Ambulatory Visit (HOSPITAL_COMMUNITY): Payer: Self-pay

## 2024-02-20 ENCOUNTER — Telehealth (HOSPITAL_COMMUNITY): Payer: Self-pay | Admitting: Pharmacy Technician

## 2024-02-20 DIAGNOSIS — E1100 Type 2 diabetes mellitus with hyperosmolarity without nonketotic hyperglycemic-hyperosmolar coma (NKHHC): Secondary | ICD-10-CM | POA: Diagnosis present

## 2024-02-20 DIAGNOSIS — Z833 Family history of diabetes mellitus: Secondary | ICD-10-CM | POA: Diagnosis not present

## 2024-02-20 DIAGNOSIS — R54 Age-related physical debility: Secondary | ICD-10-CM | POA: Diagnosis present

## 2024-02-20 DIAGNOSIS — G8929 Other chronic pain: Secondary | ICD-10-CM | POA: Diagnosis present

## 2024-02-20 DIAGNOSIS — Z8249 Family history of ischemic heart disease and other diseases of the circulatory system: Secondary | ICD-10-CM | POA: Diagnosis not present

## 2024-02-20 DIAGNOSIS — Z95828 Presence of other vascular implants and grafts: Secondary | ICD-10-CM | POA: Diagnosis not present

## 2024-02-20 DIAGNOSIS — Z604 Social exclusion and rejection: Secondary | ICD-10-CM | POA: Diagnosis present

## 2024-02-20 DIAGNOSIS — E7800 Pure hypercholesterolemia, unspecified: Secondary | ICD-10-CM | POA: Diagnosis present

## 2024-02-20 DIAGNOSIS — N1831 Chronic kidney disease, stage 3a: Secondary | ICD-10-CM | POA: Diagnosis present

## 2024-02-20 DIAGNOSIS — N179 Acute kidney failure, unspecified: Secondary | ICD-10-CM | POA: Diagnosis present

## 2024-02-20 DIAGNOSIS — E1165 Type 2 diabetes mellitus with hyperglycemia: Secondary | ICD-10-CM | POA: Diagnosis not present

## 2024-02-20 DIAGNOSIS — E872 Acidosis, unspecified: Secondary | ICD-10-CM | POA: Diagnosis present

## 2024-02-20 DIAGNOSIS — F32A Depression, unspecified: Secondary | ICD-10-CM | POA: Diagnosis present

## 2024-02-20 DIAGNOSIS — Z794 Long term (current) use of insulin: Secondary | ICD-10-CM | POA: Diagnosis not present

## 2024-02-20 DIAGNOSIS — I480 Paroxysmal atrial fibrillation: Secondary | ICD-10-CM | POA: Diagnosis present

## 2024-02-20 DIAGNOSIS — D61818 Other pancytopenia: Secondary | ICD-10-CM | POA: Diagnosis present

## 2024-02-20 DIAGNOSIS — T451X5A Adverse effect of antineoplastic and immunosuppressive drugs, initial encounter: Secondary | ICD-10-CM | POA: Diagnosis present

## 2024-02-20 DIAGNOSIS — Z944 Liver transplant status: Secondary | ICD-10-CM | POA: Diagnosis not present

## 2024-02-20 DIAGNOSIS — C787 Secondary malignant neoplasm of liver and intrahepatic bile duct: Secondary | ICD-10-CM | POA: Diagnosis present

## 2024-02-20 DIAGNOSIS — R739 Hyperglycemia, unspecified: Secondary | ICD-10-CM | POA: Diagnosis present

## 2024-02-20 DIAGNOSIS — Z79899 Other long term (current) drug therapy: Secondary | ICD-10-CM | POA: Diagnosis not present

## 2024-02-20 DIAGNOSIS — C3491 Malignant neoplasm of unspecified part of right bronchus or lung: Secondary | ICD-10-CM | POA: Diagnosis present

## 2024-02-20 DIAGNOSIS — E1122 Type 2 diabetes mellitus with diabetic chronic kidney disease: Secondary | ICD-10-CM | POA: Diagnosis present

## 2024-02-20 DIAGNOSIS — Z87891 Personal history of nicotine dependence: Secondary | ICD-10-CM | POA: Diagnosis not present

## 2024-02-20 DIAGNOSIS — I129 Hypertensive chronic kidney disease with stage 1 through stage 4 chronic kidney disease, or unspecified chronic kidney disease: Secondary | ICD-10-CM | POA: Diagnosis present

## 2024-02-20 DIAGNOSIS — E871 Hypo-osmolality and hyponatremia: Secondary | ICD-10-CM | POA: Diagnosis present

## 2024-02-20 LAB — CBC
HCT: 23.2 % — ABNORMAL LOW (ref 36.0–46.0)
Hemoglobin: 7.2 g/dL — ABNORMAL LOW (ref 12.0–15.0)
MCH: 28.3 pg (ref 26.0–34.0)
MCHC: 31 g/dL (ref 30.0–36.0)
MCV: 91.3 fL (ref 80.0–100.0)
Platelets: 85 10*3/uL — ABNORMAL LOW (ref 150–400)
RBC: 2.54 MIL/uL — ABNORMAL LOW (ref 3.87–5.11)
RDW: 17.8 % — ABNORMAL HIGH (ref 11.5–15.5)
WBC: 7.6 10*3/uL (ref 4.0–10.5)
nRBC: 0 % (ref 0.0–0.2)

## 2024-02-20 LAB — PHOSPHORUS: Phosphorus: 3 mg/dL (ref 2.5–4.6)

## 2024-02-20 LAB — GLUCOSE, CAPILLARY: Glucose-Capillary: 176 mg/dL — ABNORMAL HIGH (ref 70–99)

## 2024-02-20 LAB — BASIC METABOLIC PANEL WITH GFR
Anion gap: 7 (ref 5–15)
BUN: 44 mg/dL — ABNORMAL HIGH (ref 8–23)
CO2: 20 mmol/L — ABNORMAL LOW (ref 22–32)
Calcium: 8 mg/dL — ABNORMAL LOW (ref 8.9–10.3)
Chloride: 108 mmol/L (ref 98–111)
Creatinine, Ser: 1.82 mg/dL — ABNORMAL HIGH (ref 0.44–1.00)
GFR, Estimated: 30 mL/min — ABNORMAL LOW (ref >60)
Glucose, Bld: 187 mg/dL — ABNORMAL HIGH (ref 70–99)
Potassium: 4.6 mmol/L (ref 3.5–5.1)
Sodium: 135 mmol/L (ref 135–145)

## 2024-02-20 LAB — MAGNESIUM: Magnesium: 1.6 mg/dL — ABNORMAL LOW (ref 1.7–2.4)

## 2024-02-20 MED ORDER — TRESIBA FLEXTOUCH 200 UNIT/ML ~~LOC~~ SOPN: 28.0000 [IU] | PEN_INJECTOR | Freq: Every day | SUBCUTANEOUS | 1 refills | Status: DC

## 2024-02-20 MED ORDER — INSULIN ASPART 100 UNIT/ML FLEXPEN: 3.0000 [IU] | PEN_INJECTOR | Freq: Three times a day (TID) | SUBCUTANEOUS | 0 refills | Status: DC

## 2024-02-20 MED ORDER — TRESIBA FLEXTOUCH 200 UNIT/ML ~~LOC~~ SOPN: 28.0000 [IU] | PEN_INJECTOR | Freq: Every day | SUBCUTANEOUS | Status: DC

## 2024-02-20 MED ORDER — SODIUM CHLORIDE 0.9 % IV BOLUS
1000.0000 mL | Freq: Once | INTRAVENOUS | Status: AC
Start: 2024-02-20 — End: 2024-02-20
  Administered 2024-02-20: 1000 mL via INTRAVENOUS

## 2024-02-20 MED ORDER — HEPARIN SOD (PORK) LOCK FLUSH 100 UNIT/ML IV SOLN
500.0000 [IU] | INTRAVENOUS | Status: AC | PRN
  Administered 2024-02-20: 500 [IU]

## 2024-02-20 MED ORDER — FREESTYLE LIBRE 3 SENSOR MISC: 1.0000 [IU] | Freq: Every day | 0 refills | Status: DC

## 2024-02-20 MED ORDER — FREESTYLE LIBRE 3 READER DEVI: 1.0000 | Freq: Every day | 0 refills | Status: DC

## 2024-02-20 NOTE — Inpatient Diabetes Management (Signed)
 Inpatient Diabetes Program Recommendations  AACE/ADA: New Consensus Statement on Inpatient Glycemic Control (2015)  Target Ranges:  Prepandial:   less than 140 mg/dL      Peak postprandial:   less than 180 mg/dL (1-2 hours)      Critically ill patients:  140 - 180 mg/dL   Lab Results  Component Value Date   GLUCAP 176 (H) 02/20/2024   HGBA1C 8.0 (H) 02/18/2024    Review of Glycemic Control  Latest Reference Range & Units 02/19/24 07:27 02/19/24 11:51 02/19/24 16:32 02/19/24 20:51 02/20/24 07:33  Glucose-Capillary 70 - 99 mg/dL 409 (H) 811 (H) 914 (H) 186 (H) 176 (H)  (H): Data is abnormally high  Diabetes history: DM2 Outpatient Diabetes medications: Tresiba  10 units every day, Farxiga  QD Current orders for Inpatient glycemic control: Semglee  28 units every day, Novolog  0-20 units TID and 0-5 units at bedtime, Novolog  3 units TID, Farxiga  10 mg QD  Met with patient at bedside.  Admitted with hyperglycemia from cancer center.  She received her second round of chemotherapy on day of admission.  She confirms above home medications.  She checks her glucose at home with her glucometer.  She states her glucose is usually 130's.  Both carboplatin  and etoposide   may cause hyperglycemia.  Explained monitoring her glucose closely will be important during her treatment.  Asked her if she would be interested in a Freestyle Libre 3 CGM.  Educated her on the CGM; she states she would like to try one.  Asked out [facility] pharmacy for a benefit check.  It is covered with a Co-Pay of $29.00.  We discussed her insulin  changes for discharge.  Reviewed hypoglycemia, < 70 mg/dL, signs, symptoms and treatments.   Will continue to follow while inpatient.  Thank you, Hays Lipschutz, MSN, CDCES Diabetes Coordinator Inpatient Diabetes Program [phone] (team pager from 8a-5p)

## 2024-02-20 NOTE — Plan of Care (Signed)
  Problem: Education: Goal: Ability to describe self-care measures that may prevent or decrease complications (Diabetes Survival Skills Education) will improve Outcome: Adequate for Discharge Goal: Individualized Educational Video(s) Outcome: Adequate for Discharge   Problem: Coping: Goal: Ability to adjust to condition or change in health will improve Outcome: Adequate for Discharge   Problem: Fluid Volume: Goal: Ability to maintain a balanced intake and output will improve Outcome: Adequate for Discharge   Problem: Health Behavior/Discharge Planning: Goal: Ability to identify and utilize available resources and services will improve Outcome: Adequate for Discharge Goal: Ability to manage health-related needs will improve Outcome: Adequate for Discharge   Problem: Nutritional: Goal: Maintenance of adequate nutrition will improve Outcome: Adequate for Discharge Goal: Progress toward achieving an optimal weight will improve Outcome: Adequate for Discharge   Problem: Skin Integrity: Goal: Risk for impaired skin integrity will decrease Outcome: Adequate for Discharge   Problem: Health Behavior/Discharge Planning: Goal: Ability to manage health-related needs will improve Outcome: Adequate for Discharge   Problem: Clinical Measurements: Goal: Ability to maintain clinical measurements within normal limits will improve Outcome: Adequate for Discharge Goal: Will remain free from infection Outcome: Adequate for Discharge Goal: Diagnostic test results will improve Outcome: Adequate for Discharge Goal: Respiratory complications will improve Outcome: Adequate for Discharge Goal: Cardiovascular complication will be avoided Outcome: Adequate for Discharge   Problem: Education: Goal: Knowledge of General Education information will improve Description: Including pain rating scale, medication(s)/side effects and non-pharmacologic comfort measures Outcome: Adequate for Discharge    Problem: Safety: Goal: Ability to remain free from injury will improve Outcome: Adequate for Discharge   Problem: Skin Integrity: Goal: Risk for impaired skin integrity will decrease Outcome: Adequate for Discharge   Problem: Pain Managment: Goal: General experience of comfort will improve and/or be controlled Outcome: Adequate for Discharge

## 2024-02-20 NOTE — Telephone Encounter (Signed)
 Patient Product/process development scientist completed.    The patient is insured through [employer]. Patient has Medicare and is not eligible for a copay card, but may be able to apply for patient assistance or Medicare RX Payment Plan (Patient Must reach out to their plan, if eligible for payment plan), if available.    Ran test claim for Dexcom G7 Sensor and the current 30 day co-pay is $64.60.  Ran test claim for Freestyle Libre 3 Plus Sensor and the current 30 day co-pay is $29.13  This test claim was processed through [employer]- copay amounts may vary at other pharmacies due to [employer], or as the patient moves through the different stages of their insurance plan.     Morgan Arab, CPHT Pharmacy Technician III Certified Patient Advocate [facility] Patient Advocate Team Direct Number: [phone]  Fax: [phone]

## 2024-02-20 NOTE — Discharge Summary (Signed)
 Physician Discharge Summary  Kathryn Walsh ZOX:[id number] DOB: 1955-11-03 DOA: 02/17/2024  PCP: Kathryn Curling, MD  Admit date: 02/17/2024 Discharge date: 02/20/2024  Admitted From: Home Disposition: Home Home  Recommendations for Outpatient Follow-up:  Follow up with PCP in 1-2 weeks Please obtain BMP/CBC in one week your next doctors visit.  Discharge diabetic regimen.  Tresiba  28 units daily, Farxiga , NovoLog  3 units premeals along with sliding scale. Kathryn Walsh has been given Advised oral hydration at home   Discharge Condition: Stable CODE STATUS: Full code Diet recommendation: Diabetic  Brief/Interim Summary: Brief Narrative:    68 y.o. female with medical history significant of diabetes mellitus on insulin , hyperlipidemia, CKD, hypertension, history of liver transplant on tacrolimus  (imuran  recently discontinued in March).  Patient also has new diagnosis of lung cancer and currently receiving chemotherapy.  She received her second round of chemotherapy on the day of admission and was noted to have elevated blood glucose in the 700s.    Admitted for management of hyperglycemia.  Eventually medications were adjusted as mentioned below.  She will be given libre upon discharge as well.  Assessment & Plan:  Principal Problem:   Hyperglycemia due to diabetes mellitus (HCC)     Hyperosmolar hyperglycemic state -Supposed to be on outpatient diabetic regimen of Tresiba  25 units daily, NovoLog  6 units 3 times daily and Farxiga  10 mg daily.  Slowly hyperglycemia is improving in the hospital. - Will increase Semglee  28 units at bedtime, continue 3 units Premeal, sliding scale and Farxiga  10 mg daily.  New prescriptions have been given.  Diabetic coordinator also suggested giving Kathryn Walsh which has been prescribed.    Metastatic Small Cell Lung Cancer Following with Dr. Marguerita Walsh  - chemo with carboplatin  and etoposide    AKI on CKD IIIa - Creatinine around baseline of 1.5.  Will give 1 L  normal saline bolus prior to discharge.  She can repeat outpatient blood work in a week.  Discharge creatinine 1.82.   Pancytopenia Noted, due to malignancy, chemo.  Hemoglobin 7.0 today  Atrial Fibrillation, paroxysmal -Recently diagnosed at [facility].  Not on anticoagulation.   Hyponatremia Related to hyperglycemia   Hx C Diff Noted, no current sx (developed after chemo, treated)   S/p Liver Transplant >20 yrs ago Prior hx of Hep C treated.  On tacrolimus  (imuran  recently discontinued in setting of cancer directed therapy) Tac was decreased from 2 mg BID to 1 mg BID at recent [facility] admission in march Will discuss whether need tac level with pharmacy (saw transplant 4/16, see care everywhere -> tac level 4.2 - "hard to push tac in this setting" with creatinine 1.8 they'd planned for repeat labs in 1 month)   Hypertension amlodipine  Losartan   Dyslipidemia Pravastatin     Chronic Pain PDMP recently filled hydromorphine 4 mg tablets #40 pills (enough for 20 days) on 01/17/2024 - unclear if she has any of these left See PDMP regarding other meds (morphine  sulfate IR filled on 3/16 - enough for 15 days) and oxycodone  filled on 12/30/2023 enough for 10 days All different prescribers - for now, sill continue oxy prn here  DVT prophylaxis: heparin  injection 5,000 Units Start: 02/18/24 0600 SCDs Start: 02/17/24 2243      Code Status: Full Code Family Communication: Called but no answer Discharge today   Subjective:  Feels well wishing to go home  Examination:  General exam: Appears calm and comfortable  Respiratory system: Clear to auscultation. Respiratory effort normal. Cardiovascular system: S1 & S2 heard, RRR. No JVD, murmurs,  rubs, gallops or clicks. No pedal edema. Gastrointestinal system: Abdomen is nondistended, soft and nontender. No organomegaly or masses felt. Normal bowel sounds heard. Central nervous system: Alert and oriented. No focal neurological  deficits. Extremities: Symmetric 5 x 5 power. Skin: No rashes, lesions or ulcers Psychiatry: Judgement and insight appear normal. Mood & affect appropriate.    Discharge Diagnoses:  Principal Problem:   Hyperglycemia due to diabetes mellitus (HCC) Active Problems:   Hyperglycemia      Discharge Exam: Vitals:   02/19/24 1915 02/20/24 0500  BP: 113/61 120/79  Pulse: 96 100  Resp: 18 18  Temp: 98.8 F (37.1 C) 99.3 F (37.4 C)  SpO2: 96% 99%   Vitals:   02/19/24 1351 02/19/24 1352 02/19/24 1915 02/20/24 0500  BP:  (!) 120/58 113/61 120/79  Pulse: 73 82 96 100  Resp: 18 18 18 18   Temp: 99 F (37.2 C) 99 F (37.2 C) 98.8 F (37.1 C) 99.3 F (37.4 C)  TempSrc:  Oral Oral Oral  SpO2: 100% 100% 96% 99%  Weight:      Height:          Discharge Instructions   Allergies as of 02/20/2024   No Known Allergies      Medication List     STOP taking these medications    HumaLOG  KwikPen 200 UNIT/ML KwikPen Generic drug: insulin  lispro       TAKE these medications    amLODipine  5 MG tablet Commonly known as: NORVASC  TAKE 1 TABLET BY MOUTH TWICE A DAY   azaTHIOprine  50 MG tablet Commonly known as: IMURAN  Take 100 mg by mouth 2 (two) times daily.   Cholecalciferol  125 MCG (5000 UT) Tabs Take 5,000 Units by mouth daily.   dapagliflozin  propanediol 10 MG Tabs tablet Commonly known as: Farxiga  Take 1 tablet (10 mg total) by mouth daily before breakfast.   diltiazem 240 MG 24 hr capsule Commonly known as: CARDIZEM CD Take 240 mg by mouth daily.   FreeStyle Libre 3 Reader [place] 1 each by Does not apply route daily in the afternoon.   FreeStyle Libre 3 Sensor Misc 1 Units by Does not apply route daily in the afternoon. Place 1 sensor on the skin every 14 days. Use to check glucose continuously   gabapentin  300 MG capsule Commonly known as: NEURONTIN  Take 1 capsule by mouth at bedtime.   HYDROmorphone  4 MG tablet Commonly known as: DILAUDID  Take 4 mg  by mouth every 4 (four) hours as needed for severe pain (pain score 7-10).   hydrOXYzine  25 MG tablet Commonly known as: ATARAX  Take 25 mg by mouth 3 (three) times daily as needed for anxiety.   insulin  aspart 100 UNIT/ML FlexPen Commonly known as: NOVOLOG  Inject 3 Units into the skin 3 (three) times daily with meals. If eating and Blood Glucose (BG) 80 or higher inject 3 units for meal coverage and add correction dose per scale. If not eating, correction dose only. BG <150= 0 unit; BG 150-200= 1 unit; BG 201-250= 3 unit; BG 251-300= 5 unit; BG 301-350= 7 unit; BG 351-400= 9 unit; BG >400= 11 unit and Call Primary Care.   lidocaine -prilocaine  cream Commonly known as: EMLA  Apply to affected area once What changed:  how much to take how to take this when to take this   losartan  25 MG tablet Commonly known as: COZAAR  Take 25 mg by mouth daily.   morphine  15 MG tablet Commonly known as: MSIR Take 15 mg by mouth every  4 (four) hours as needed for moderate pain (pain score 4-6) or severe pain (pain score 7-10).   ondansetron  4 MG disintegrating tablet Commonly known as: ZOFRAN -ODT Take 4 mg by mouth every 8 (eight) hours as needed for nausea or vomiting.   ondansetron  8 MG tablet Commonly known as: Zofran  Take 1 tablet (8 mg total) by mouth every 8 (eight) hours as needed for nausea or vomiting. Start on third day after chemotherapy.   oxyCODONE  5 MG immediate release tablet Commonly known as: Oxy IR/ROXICODONE  Take 5 mg by mouth every 4 (four) hours as needed for moderate pain (pain score 4-6) or severe pain (pain score 7-10).   pravastatin  40 MG tablet Commonly known as: PRAVACHOL  TAKE 1 TABLET BY MOUTH EVERYDAY AT BEDTIME What changed: See the new instructions.   prochlorperazine  10 MG tablet Commonly known as: COMPAZINE  Take 1 tablet (10 mg total) by mouth every 6 (six) hours as needed for nausea or vomiting (Nausea or vomiting).   tacrolimus  1 MG capsule Commonly known  as: PROGRAF  Take 2-3 mg by mouth 2 (two) times daily. Take two tablets by mouth in the morning. Take three tablets by mouth in the evening.   Tresiba  FlexTouch 200 UNIT/ML FlexTouch Pen Generic drug: insulin  degludec Inject 28 Units into the skin daily at 10 pm. What changed:  how much to take how to take this when to take this additional instructions   TURMERIC PO Take 1 capsule by mouth daily at 12 noon.        No Known Allergies  You were cared for by a hospitalist during your hospital stay. If you have any questions about your discharge medications or the care you received while you were in the hospital after you are discharged, you can call the unit and asked to speak with the hospitalist on call if the hospitalist that took care of you is not available. Once you are discharged, your primary care physician will handle any further medical issues. Please note that no refills for any discharge medications will be authorized once you are discharged, as it is imperative that you return to your primary care physician (or establish a relationship with a primary care physician if you do not have one) for your aftercare needs so that they can reassess your need for medications and monitor your lab values.  You were cared for by a hospitalist during your hospital stay. If you have any questions about your discharge medications or the care you received while you were in the hospital after you are discharged, you can call the unit and asked to speak with the hospitalist on call if the hospitalist that took care of you is not available. Once you are discharged, your primary care physician will handle any further medical issues. Please note that NO REFILLS for any discharge medications will be authorized once you are discharged, as it is imperative that you return to your primary care physician (or establish a relationship with a primary care physician if you do not have one) for your aftercare needs  so that they can reassess your need for medications and monitor your lab values.  Please request your Prim.MD to go over all Hospital Tests and Procedure/Radiological results at the follow up, please get all Hospital records sent to your Prim MD by signing hospital release before you go home.  Get CBC, CMP, 2 view Chest X ray checked  by Primary MD during your next visit or SNF MD in 5-7 days (  we routinely change or add medications that can affect your baseline labs and fluid status, therefore we recommend that you get the mentioned basic workup next visit with your PCP, your PCP may decide not to get them or add new tests based on their clinical decision)  On your next visit with your primary care physician please Get Medicines reviewed and adjusted.  If you experience worsening of your admission symptoms, develop shortness of breath, life threatening emergency, suicidal or homicidal thoughts you must seek medical attention immediately by calling 911 or calling your MD immediately  if symptoms less severe.  You Must read complete instructions/literature along with all the possible adverse reactions/side effects for all the Medicines you take and that have been prescribed to you. Take any new Medicines after you have completely understood and accpet all the possible adverse reactions/side effects.   Do not drive, operate heavy machinery, perform activities at heights, swimming or participation in water activities or provide baby sitting services if your were admitted for syncope or siezures until you have seen by Primary MD or a Neurologist and advised to do so again.  Do not drive when taking Pain medications.   Procedures/Studies: IR IMAGING GUIDED PORT INSERTION Result Date: 02/14/2024 INDICATION: 68 year old female with small cell lung cancer. She presents to establish durable venous access. EXAM: IMPLANTED PORT A CATH PLACEMENT WITH ULTRASOUND AND FLUOROSCOPIC GUIDANCE MEDICATIONS: None.  ANESTHESIA/SEDATION: Versed  1 mg IV; Fentanyl  100 mcg IV; administered by the radiology nurse Moderate Sedation Time:  10 minutes The patient's vital signs and level of consciousness were continuously monitored during the procedure by the interventional radiology nurse under my direct supervision. FLUOROSCOPY: Radiation exposure index: 2 mGy, reference air kerma COMPLICATIONS: None immediate. PROCEDURE: The right neck and chest was prepped with chlorhexidine , and draped in the usual sterile fashion using maximum barrier technique (cap and mask, sterile gown, sterile gloves, large sterile sheet, hand hygiene and cutaneous antiseptic). Local anesthesia was attained by infiltration with 1% lidocaine  with epinephrine . Ultrasound demonstrated patency of the right internal jugular vein, and this was documented with an image. Under real-time ultrasound guidance, this vein was accessed with a 21 gauge micropuncture needle and image documentation was performed. A small dermatotomy was made at the access site with an 11 scalpel. A 0.018" wire was advanced into the SVC and the access needle exchanged for a 35F micropuncture vascular sheath. The 0.018" wire was then removed and a 0.035" wire advanced into the IVC. An appropriate location for the subcutaneous reservoir was selected below the clavicle and an incision was made through the skin and underlying soft tissues. The subcutaneous tissues were then dissected using a combination of blunt and sharp surgical technique and a pocket was formed. An Angio [employer] single lumen power injectable portacatheter was then tunneled through the subcutaneous tissues from the pocket to the dermatotomy and the port reservoir placed within the subcutaneous pocket. The venous access site was then serially dilated and a peel away vascular sheath placed over the wire. The wire was removed and the port catheter advanced into position under fluoroscopic guidance. The catheter tip is  positioned in the superior cavoatrial junction. This was documented with a spot image. The portacatheter was then tested and found to flush and aspirate well. The port was flushed with saline followed by 100 units/mL heparinized saline. The pocket was then closed in two layers using first subdermal inverted interrupted absorbable sutures followed by a running subcuticular suture. The epidermis was then sealed  with Dermabond. The dermatotomy at the venous access site was also closed with Dermabond. IMPRESSION: Successful placement of a right IJ approach [employer] with ultrasound and fluoroscopic guidance. The catheter is ready for use. Electronically Signed   By: Fernando Hoyer M.D.   On: 02/14/2024 14:22     The results of significant diagnostics from this hospitalization (including imaging, microbiology, ancillary and laboratory) are listed below for reference.     Microbiology: No results found for this or any previous visit (from the past 240 hours).   Labs: BNP (last 3 results) No results for input(s): "BNP" in the last 8760 hours. Basic Metabolic Panel: Recent Labs  Lab 02/17/24 1444 02/17/24 1540 02/17/24 2245 02/18/24 0242 02/18/24 0556 02/19/24 0518 02/20/24 0256  NA 128*   < > 135 131* 130* 136 135  K 4.7   < > 4.9 5.1 4.7 4.6 4.6  CL 103   < > 108 105 103 109 108  CO2 18*   < > 21* 20* 18* 20* 20*  GLUCOSE 754*   < > 490* 491* 486* 297* 187*  BUN 31*   < > 29* 31* 32* 39* 44*  CREATININE 1.73*   < > 1.73* 1.58* 1.65* 1.67* 1.82*  CALCIUM 8.3*   < > 8.5* 8.3* 8.7* 8.5* 8.0*  MG 1.7  --   --   --   --  1.6* 1.6*  PHOS  --   --   --   --   --  3.6 3.0   < > = values in this interval not displayed.   Liver Function Tests: Recent Labs  Lab 02/17/24 0937 02/19/24 0518  AST 19 38  ALT 26 32  ALKPHOS 472* 405*  BILITOT 0.8 0.8  PROT 8.1 6.8  ALBUMIN 2.9* 2.1*   No results for input(s): "LIPASE", "AMYLASE" in the last 168 hours. No results for  input(s): "AMMONIA" in the last 168 hours. CBC: Recent Labs  Lab 02/17/24 0937 02/17/24 1444 02/17/24 1540 02/18/24 0556 02/19/24 0518 02/20/24 0256  WBC 8.7 7.2  --  7.5 7.5 7.6  NEUTROABS 7.3 6.9  --   --  6.5  --   HGB 7.7* 7.2* 7.8* 7.4* 7.0* 7.2*  HCT 24.5* 23.9* 23.0* 23.5* 22.3* 23.2*  MCV 86.9 93.7  --  90.0 90.3 91.3  PLT 124* 103*  --  110* 100* 85*   Cardiac Enzymes: No results for input(s): "CKTOTAL", "CKMB", "CKMBINDEX", "TROPONINI" in the last 168 hours. BNP: Invalid input(s): "POCBNP" CBG: Recent Labs  Lab 02/19/24 0727 02/19/24 1151 02/19/24 1632 02/19/24 2051 02/20/24 0733  GLUCAP 255* 195* 168* 186* 176*   D-Dimer No results for input(s): "DDIMER" in the last 72 hours. Hgb A1c Recent Labs    02/18/24 0242  HGBA1C 8.0*   Lipid Profile No results for input(s): "CHOL", "HDL", "LDLCALC", "TRIG", "CHOLHDL", "LDLDIRECT" in the last 72 hours. Thyroid  function studies No results for input(s): "TSH", "T4TOTAL", "T3FREE", "THYROIDAB" in the last 72 hours.  Invalid input(s): "FREET3" Anemia work up No results for input(s): "VITAMINB12", "FOLATE", "FERRITIN", "TIBC", "IRON", "RETICCTPCT" in the last 72 hours. Urinalysis    Component Value Date/Time   COLORURINE STRAW (A) 02/17/2024 1730   APPEARANCEUR CLEAR 02/17/2024 1730   LABSPEC 1.022 02/17/2024 1730   PHURINE 5.0 02/17/2024 1730   GLUCOSEU >=500 (A) 02/17/2024 1730   HGBUR SMALL (A) 02/17/2024 1730   BILIRUBINUR NEGATIVE 02/17/2024 1730   BILIRUBINUR Negative 07/08/2023 1528   KETONESUR NEGATIVE 02/17/2024  1730   PROTEINUR NEGATIVE 02/17/2024 1730   UROBILINOGEN 0.2 07/08/2023 1528   UROBILINOGEN 0.2 07/11/2013 1530   NITRITE NEGATIVE 02/17/2024 1730   LEUKOCYTESUR NEGATIVE 02/17/2024 1730   Sepsis Labs Recent Labs  Lab 02/17/24 1444 02/18/24 0556 02/19/24 0518 02/20/24 0256  WBC 7.2 7.5 7.5 7.6   Microbiology No results found for this or any previous visit (from the past 240  hours).   Time coordinating discharge:  I have spent 35 minutes face to face with the patient and on the ward discussing the patients care, assessment, plan and disposition with other care givers. >50% of the time was devoted counseling the patient about the risks and benefits of treatment/Discharge disposition and coordinating care.   SIGNED:   Maggie Schooner, MD  [facility] 02/20/2024, 10:56 AM   If 7PM-7AM, please contact night-coverage

## 2024-02-21 ENCOUNTER — Telehealth: Payer: Self-pay | Admitting: *Deleted

## 2024-02-21 ENCOUNTER — Encounter: Payer: Self-pay | Admitting: Internal Medicine

## 2024-02-21 LAB — TYPE AND SCREEN
ABO/RH(D): A POS
Antibody Screen: POSITIVE
DAT, IgG: POSITIVE
Donor AG Type: NEGATIVE
PT AG Type: NEGATIVE
Unit division: 0

## 2024-02-21 LAB — BPAM RBC
Blood Product Expiration Date: 202505202359
Unit Type and Rh: 6200

## 2024-02-21 NOTE — Transitions of Care (Post Inpatient/ED Visit) (Signed)
   02/21/2024  Name: Kathryn Walsh MRN: [id number] DOB: 1956/07/25  Today's TOC FU Call Status: Today's TOC FU Call Status:: Unsuccessful Call (1st Attempt) Unsuccessful Call (1st Attempt) Date: 02/21/24  Attempted to reach the patient regarding the most recent Inpatient/ED visit.  Follow Up Plan: Additional outreach attempts will be made to reach the patient to complete the Transitions of Care (Post Inpatient/ED visit) call.   Arna Better RN, BSN [place]  [facility] RN Care Manager [phone]

## 2024-02-21 NOTE — Transitions of Care (Post Inpatient/ED Visit) (Signed)
 02/21/2024  Name: Kathryn Walsh MRN: [id number] DOB: [birth year]  Today's TOC FU Call Status: Today's TOC FU Call Status:: Successful TOC FU Call Completed TOC FU Call Complete Date: 02/21/24 Patient's Name and Date of Birth confirmed.  Transition Care Management Follow-up Telephone Call Date of Discharge: 02/20/24 Discharge Facility: [facility]) Type of Discharge: Inpatient Admission Primary Inpatient Discharge Diagnosis:: Hyperglycemia due to diabetes mellitus How have you been since you were released from the hospital?: Better Any questions or concerns?: Yes Patient Questions/Concerns:: Patient would like something for cough Patient Questions/Concerns Addressed: Other: (Family will contact provider via MyChart message)  Items Reviewed: Did you receive and understand the discharge instructions provided?: Yes Medications obtained,verified, and reconciled?: Yes (Medications Reviewed) Any new allergies since your discharge?: No Dietary orders reviewed?: Yes Type of Diet Ordered:: Diabetic diet/carb modified Do you have support at home?: Yes People in Home [RPT]: child(ren), adult Name of Support/Comfort Primary Source: Daughter/[name]  Medications Reviewed Today: Medications Reviewed Today     Reviewed by Aura Leeds, RN (Registered Nurse) on 02/21/24 at 1510  Med List Status: <None>   Medication Order Taking? Sig Documenting Provider Last Dose Status Informant  amLODipine  (NORVASC ) 5 MG tablet [id number] Yes TAKE 1 TABLET BY MOUTH TWICE A DAY Sanders, Robyn, MD Taking Active Self, Pharmacy Records, Multiple Informants  azaTHIOprine  (IMURAN ) 50 MG tablet [id number] Yes Take 100 mg by mouth 2 (two) times daily.  [provider] Taking Active Self, Pharmacy Records, Multiple Informants           Med Note Baltazar Leventhal, [place]   Tue Feb 18, 2024 11:46 AM)    Cholecalciferol  125 MCG (5000 UT) TABS [id number] No Take 5,000 Units by mouth daily.  Patient not taking:  Reported on 02/21/2024   [provider] Not Taking Active Self, Pharmacy Records, Multiple Informants  Continuous Glucose Receiver (FREESTYLE LIBRE 3 READER) DEVI [id number] Yes 1 each by Does not apply route daily in the afternoon. Maggie Schooner, MD Taking Active   Continuous Glucose Sensor (FREESTYLE LIBRE 3 SENSOR) MISC [id number] Yes 1 Units by Does not apply route daily in the afternoon. Place 1 sensor on the skin every 14 days. Use to check glucose continuously Amin, Ankit C, MD Taking Active   dapagliflozin  propanediol (FARXIGA ) 10 MG TABS tablet [id number] Yes Take 1 tablet (10 mg total) by mouth daily before breakfast. [name], MD Taking Active Self, Pharmacy Records, Multiple Informants  diltiazem (CARDIZEM CD) 240 MG 24 hr capsule [id number] Yes Take 240 mg by mouth daily. [provider] Taking Active Self, Pharmacy Records, Multiple Informants  gabapentin  (NEURONTIN ) 300 MG capsule [id number] Yes Take 1 capsule by mouth at bedtime. [provider] Taking Active Self, Pharmacy Records, Multiple Informants  HYDROmorphone  (DILAUDID ) 4 MG tablet [id number] Yes Take 4 mg by mouth every 4 (four) hours as needed for severe pain (pain score 7-10). [provider] Taking Active Self, Pharmacy Records, Multiple Informants  hydrOXYzine  (ATARAX ) 25 MG tablet [id number] Yes Take 25 mg by mouth 3 (three) times daily as needed for anxiety. [provider] Taking Active Self, Pharmacy Records, Multiple Informants  insulin  aspart (NOVOLOG ) 100 UNIT/ML FlexPen [id number] Yes Inject 3 Units into the skin 3 (three) times daily with meals. If eating and Blood Glucose (BG) 80 or higher inject 3 units for meal coverage and add correction dose per scale. If not eating, correction dose only. BG <150= 0 unit; BG 150-200= 1 unit; BG 201-250= 3 unit; BG 251-300= 5  unit; BG 301-350= 7 unit; BG 351-400= 9 unit; BG >400= 11 unit and Call Primary Care. Maggie Schooner, MD Taking  Active   insulin  degludec (TRESIBA  FLEXTOUCH) 200 UNIT/ML FlexTouch Pen [id number] Yes Inject 28 Units into the skin daily at 10 pm. Amin, Ankit C, MD Taking Active   lidocaine -prilocaine  (EMLA ) cream [id number] Yes Apply to affected area once  Patient taking differently: Apply 1 Application topically once. Apply to affected area once   Marlene Simas, MD Taking Active Self, Pharmacy Records, Multiple Informants  losartan  (COZAAR ) 25 MG tablet [id number] Yes Take 25 mg by mouth daily. [provider] Taking Active Self, Pharmacy Records, Multiple Informants  morphine  (MSIR) 15 MG tablet [id number] No Take 15 mg by mouth every 4 (four) hours as needed for moderate pain (pain score 4-6) or severe pain (pain score 7-10).  Patient not taking: Reported on 02/21/2024   [provider] Not Taking Active Self, Pharmacy Records, Multiple Informants  ondansetron  (ZOFRAN ) 8 MG tablet [id number] No Take 1 tablet (8 mg total) by mouth every 8 (eight) hours as needed for nausea or vomiting. Start on third day after chemotherapy.  Patient not taking: Reported on 02/21/2024   Marlene Simas, MD Not Taking Active Self, Pharmacy Records, Multiple Informants  ondansetron  (ZOFRAN -ODT) 4 MG disintegrating tablet [id number] No Take 4 mg by mouth every 8 (eight) hours as needed for nausea or vomiting.  Patient not taking: Reported on 02/21/2024   [provider] Not Taking Active Self, Pharmacy Records, Multiple Informants  oxyCODONE  (OXY IR/ROXICODONE ) 5 MG immediate release tablet [id number] No Take 5 mg by mouth every 4 (four) hours as needed for moderate pain (pain score 4-6) or severe pain (pain score 7-10).  Patient not taking: Reported on 02/21/2024   [provider] Not Taking Active Self, Pharmacy Records, Multiple Informants  pravastatin  (PRAVACHOL ) 40 MG tablet [id number] No TAKE 1 TABLET BY MOUTH EVERYDAY AT BEDTIME  Patient not taking: Reported on 02/21/2024   [name], MD  Not Taking Active Self, Pharmacy Records, Multiple Informants  prochlorperazine  (COMPAZINE ) 10 MG tablet [id number] No Take 1 tablet (10 mg total) by mouth every 6 (six) hours as needed for nausea or vomiting (Nausea or vomiting).  Patient not taking: Reported on 02/21/2024   Marlene Simas, MD Not Taking Active Self, Pharmacy Records, Multiple Informants  tacrolimus  (PROGRAF ) 1 MG capsule [id number] Yes Take 2-3 mg by mouth 2 (two) times daily. Take two tablets by mouth in the morning. Take three tablets by mouth in the evening. [provider] Taking Active Self, Pharmacy Records, Multiple Informants  TURMERIC PO [id number] Yes Take 1 capsule by mouth daily at 12 noon. [provider] Taking Active Self, Pharmacy Records, Multiple Informants            Home Care and Equipment/Supplies: Were Home Health Services Ordered?: No Any new equipment or medical supplies ordered?: No  Functional Questionnaire: Do you need assistance with bathing/showering or dressing?: No Do you need assistance with meal preparation?: No Do you need assistance with eating?: No Do you have difficulty maintaining continence: No Do you need assistance with getting out of bed/getting out of a chair/moving?: No Do you have difficulty managing or taking your medications?: No  Follow up appointments reviewed: PCP Follow-up appointment confirmed?: Yes Date of PCP follow-up appointment?: 02/26/24 Follow-up Provider: Dr. Elnita Hai [facility] Follow-up appointment confirmed?: Yes Date of Specialist follow-up appointment?: 02/26/24 Follow-Up Specialty Provider:: Oncology Do you need transportation to your follow-up appointment?: No  Do you understand care options if your condition(s) worsen?: Yes-patient verbalized understanding  SDOH Interventions Today    Flowsheet Row Most Recent Value  SDOH Interventions   Food Insecurity Interventions Intervention Not Indicated  Housing Interventions  Intervention Not Indicated  Transportation Interventions Intervention Not Indicated  Utilities Interventions Intervention Not Indicated       Goals Addressed             This Visit's Progress    VBCI Transitions of Care (TOC) Care Plan       Problems:  Recent Hospitalization for treatment of  Hyperglycemia Knowledge deficit  Goal:  Over the next 30 days, the patient will not experience hospital readmission  Interventions: new goal  Diabetes Interventions: Assessed patient's understanding of A1c goal: <7% Provided education to patient about basic DM disease process Reviewed medications with patient and discussed importance of medication adherence Discussed plans with patient for ongoing care management follow up and provided patient with direct contact information for care management team Reviewed scheduled/upcoming provider appointments including: 02/26/24 with PCP or reschedule if unable to attend, 02/26/24 with Oncology Advised patient, providing education and rationale, to check cbg 4 times daily and record, calling provider for findings outside established parameters Review of patient status, including review of consultants reports, relevant laboratory and other test results, and medications completed Assessed social determinant of health barriers Lab Results  Component Value Date   HGBA1C 8.0 (H) 02/18/2024    Patient Self Care Activities:  Attend all scheduled provider appointments Call provider office for new concerns or questions  Participate in Transition of Care Program/Attend TOC scheduled calls Perform all self care activities independently  Take medications as prescribed   enter blood sugar readings and medication or insulin  into daily log take the blood sugar log to all doctor visits drink 6 to 8 glasses of water each day fill half of plate with vegetables  Plan:  Telephone follow up appointment with care management team member scheduled for:  02/28/24 at  2pm with Mylinda Asa, RN        Arna Better RN, BSN [place]  Value-Based Care [facility] RN Care Manager [phone]

## 2024-02-22 ENCOUNTER — Other Ambulatory Visit: Payer: Self-pay | Admitting: Physician Assistant

## 2024-02-22 DIAGNOSIS — D649 Anemia, unspecified: Secondary | ICD-10-CM

## 2024-02-22 NOTE — Progress Notes (Unsigned)
 [facility] OFFICE PROGRESS NOTE  Kathryn Curling, MD 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   PRIOR THERAPY: None  CURRENT THERAPY: Palliative systemic chemotherapy with dose reduced chemotherapy with carboplatin  AUC of 4 and etoposide  80 mg and Cosela . She is here to finish cycle #2 which was interrupted by hospitalization for hyperglycemia. The patient received 1 cycle of systemic chemotherapy at [facility] cancer center but this was complicated with prolonged hospitalization and C. difficile infection.   INTERVAL HISTORY: Kathryn Walsh 68 y.o. female returns to the clinic today for follow-up visit.  In summary the patient establish care in the clinic on/14/25.  She was diagnosed with extensive stage small cell lung cancer and underwent her first cycle of treatment at [facility].  This was complicated by a prolonged hospitalization for C. difficile.  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  She then came in for cycle #2 on 02/17/2024 at the [facility].  She had significant hyperglycemia that day and was subsequently sent to the emergency room where she was hospitalized from 4/21 to 4/24.  Taking diabetes medicines??  She also has been having some cytopenias requiring blood transfusion?  Was supposed to get blood transfusion but did not  Overall she is feeling ***today.  Fatigue?  She denies any fever, chills, or night sweats.  Weight loss and appetite?  She has some ongoing shortness of breath and breathing difficulty and pain under the rib cage.  She denies any chest pain or hemoptysis.  Denies any nausea,  vomiting, diarrhea.  Constipation?  Significant weight loss.  She is here today for evaluation repeat blood work before considering getting day 2 and 3 of cycle #2    MEDICAL HISTORY: Past Medical History:  Diagnosis Date   Diabetes mellitus without complication (HCC)    High cholesterol    HTN (hypertension)    Liver transplant recipient [facility])     ALLERGIES:  has no known allergies.  MEDICATIONS:  Current Outpatient Medications  Medication Sig Dispense Refill   amLODipine  (NORVASC ) 5 MG tablet TAKE 1 TABLET BY MOUTH TWICE A DAY 180 tablet 1   azaTHIOprine  (IMURAN ) 50 MG tablet Take 100 mg by mouth 2 (two) times daily.      Cholecalciferol  125 MCG (5000 UT) TABS Take 5,000 Units by mouth daily. (Patient not taking: Reported on 02/21/2024)     Continuous Glucose Receiver (FREESTYLE LIBRE 3 READER) DEVI 1 each by Does not apply route daily in the afternoon. 1 each 0   Continuous Glucose Sensor (FREESTYLE LIBRE 3 SENSOR) MISC 1 Units by Does not apply route daily in the afternoon. Place 1 sensor on the skin every 14 days. Use to check glucose continuously 2 each 0   dapagliflozin  propanediol (FARXIGA ) 10 MG TABS tablet Take 1 tablet (10 mg total) by mouth daily before breakfast. 30 tablet 5   diltiazem (CARDIZEM CD) 240 MG 24 hr capsule Take 240 mg by mouth daily.     gabapentin  (NEURONTIN ) 300 MG capsule Take 1 capsule by mouth at bedtime.     HYDROmorphone  (DILAUDID ) 4 MG tablet Take 4 mg by mouth every 4 (four) hours  as needed for severe pain (pain score 7-10).     hydrOXYzine  (ATARAX ) 25 MG tablet Take 25 mg by mouth 3 (three) times daily as needed for anxiety.     insulin  aspart (NOVOLOG ) 100 UNIT/ML FlexPen Inject 3 Units into the skin 3 (three) times daily with meals. If eating and Blood Glucose (BG) 80 or higher inject 3 units for meal coverage and add correction dose per scale. If not eating, correction dose only. BG <150= 0 unit; BG 150-200= 1 unit; BG 201-250= 3 unit; BG 251-300= 5  unit; BG 301-350= 7 unit; BG 351-400= 9 unit; BG >400= 11 unit and Call Primary Care. 15 mL 0   insulin  degludec (TRESIBA  FLEXTOUCH) 200 UNIT/ML FlexTouch Pen Inject 28 Units into the skin daily at 10 pm.     lidocaine -prilocaine  (EMLA ) cream Apply to affected area once (Patient taking differently: Apply 1 Application topically once. Apply to affected area once) 30 g 3   losartan  (COZAAR ) 25 MG tablet Take 25 mg by mouth daily.     morphine  (MSIR) 15 MG tablet Take 15 mg by mouth every 4 (four) hours as needed for moderate pain (pain score 4-6) or severe pain (pain score 7-10). (Patient not taking: Reported on 02/21/2024)     ondansetron  (ZOFRAN ) 8 MG tablet Take 1 tablet (8 mg total) by mouth every 8 (eight) hours as needed for nausea or vomiting. Start on third day after chemotherapy. (Patient not taking: Reported on 02/21/2024) 30 tablet 1   ondansetron  (ZOFRAN -ODT) 4 MG disintegrating tablet Take 4 mg by mouth every 8 (eight) hours as needed for nausea or vomiting. (Patient not taking: Reported on 02/21/2024)     oxyCODONE  (OXY IR/ROXICODONE ) 5 MG immediate release tablet Take 5 mg by mouth every 4 (four) hours as needed for moderate pain (pain score 4-6) or severe pain (pain score 7-10). (Patient not taking: Reported on 02/21/2024)     pravastatin  (PRAVACHOL ) 40 MG tablet TAKE 1 TABLET BY MOUTH EVERYDAY AT BEDTIME (Patient not taking: Reported on 02/21/2024) 90 tablet 1   prochlorperazine  (COMPAZINE ) 10 MG tablet Take 1 tablet (10 mg total) by mouth every 6 (six) hours as needed for nausea or vomiting (Nausea or vomiting). (Patient not taking: Reported on 02/21/2024) 30 tablet 1   tacrolimus  (PROGRAF ) 1 MG capsule Take 2-3 mg by mouth 2 (two) times daily. Take two tablets by mouth in the morning. Take three tablets by mouth in the evening.     TURMERIC PO Take 1 capsule by mouth daily at 12 noon.     No current facility-administered medications for this visit.    SURGICAL HISTORY:  Past Surgical  History:  Procedure Laterality Date   IR IMAGING GUIDED PORT INSERTION  02/14/2024   LIVER TRANSPLANTATION      REVIEW OF SYSTEMS:   Review of Systems  Constitutional: Negative for appetite change, chills, fatigue, fever and unexpected weight change.  HENT:   Negative for mouth sores, nosebleeds, sore throat and trouble swallowing.   Eyes: Negative for eye problems and icterus.  Respiratory: Negative for cough, hemoptysis, shortness of breath and wheezing.   Cardiovascular: Negative for chest pain and leg swelling.  Gastrointestinal: Negative for abdominal pain, constipation, diarrhea, nausea and vomiting.  Genitourinary: Negative for bladder incontinence, difficulty urinating, dysuria, frequency and hematuria.   Musculoskeletal: Negative for back pain, gait problem, neck pain and neck stiffness.  Skin: Negative for itching and rash.  Neurological: Negative for dizziness, extremity weakness, gait problem, headaches, light-headedness and  seizures.  Hematological: Negative for adenopathy. Does not bruise/bleed easily.  Psychiatric/Behavioral: Negative for confusion, depression and sleep disturbance. The patient is not nervous/anxious.     PHYSICAL EXAMINATION:  There were no vitals taken for this visit.  ECOG PERFORMANCE STATUS: {CHL ONC ECOG [id number]  Physical Exam  Constitutional: Oriented to person, place, and time and well-developed, well-nourished, and in no distress. No distress.  HENT:  Head: Normocephalic and atraumatic.  Mouth/Throat: Oropharynx is clear and moist. No oropharyngeal exudate.  Eyes: Conjunctivae are normal. Right eye exhibits no discharge. Left eye exhibits no discharge. No scleral icterus.  Neck: Normal range of motion. Neck supple.  Cardiovascular: Normal rate, regular rhythm, normal heart sounds and intact distal pulses.   Pulmonary/Chest: Effort normal and breath sounds normal. No respiratory distress. No wheezes. No rales.  Abdominal: Soft. Bowel  sounds are normal. Exhibits no distension and no mass. There is no tenderness.  Musculoskeletal: Normal range of motion. Exhibits no edema.  Lymphadenopathy:    No cervical adenopathy.  Neurological: Alert and oriented to person, place, and time. Exhibits normal muscle tone. Gait normal. Coordination normal.  Skin: Skin is warm and dry. No rash noted. Not diaphoretic. No erythema. No pallor.  Psychiatric: Mood, memory and judgment normal.  Vitals reviewed.  LABORATORY DATA: Lab Results  Component Value Date   WBC 7.6 02/20/2024   HGB 7.2 (L) 02/20/2024   HCT 23.2 (L) 02/20/2024   MCV 91.3 02/20/2024   PLT 85 (L) 02/20/2024      Chemistry      Component Value Date/Time   NA 135 02/20/2024 0256   NA 144 11/07/2023 1522   K 4.6 02/20/2024 0256   CL 108 02/20/2024 0256   CO2 20 (L) 02/20/2024 0256   BUN 44 (H) 02/20/2024 0256   BUN 23 11/07/2023 1522   CREATININE 1.82 (H) 02/20/2024 0256   CREATININE 1.75 (H) 02/17/2024 0937   CREATININE 0.83 09/07/2021 0940      Component Value Date/Time   CALCIUM 8.0 (L) 02/20/2024 0256   ALKPHOS 405 (H) 02/19/2024 0518   AST 38 02/19/2024 0518   AST 19 02/17/2024 0937   ALT 32 02/19/2024 0518   ALT 26 02/17/2024 0937   BILITOT 0.8 02/19/2024 0518   BILITOT 0.8 02/17/2024 0937       RADIOGRAPHIC STUDIES:  IR IMAGING GUIDED PORT INSERTION Result Date: 02/14/2024 INDICATION: 68 year old female with small cell lung cancer. She presents to establish durable venous access. EXAM: IMPLANTED PORT A CATH PLACEMENT WITH ULTRASOUND AND FLUOROSCOPIC GUIDANCE MEDICATIONS: None. ANESTHESIA/SEDATION: Versed  1 mg IV; Fentanyl  100 mcg IV; administered by the radiology nurse Moderate Sedation Time:  10 minutes The patient's vital signs and level of consciousness were continuously monitored during the procedure by the interventional radiology nurse under my direct supervision. FLUOROSCOPY: Radiation exposure index: 2 mGy, reference air kerma  COMPLICATIONS: None immediate. PROCEDURE: The right neck and chest was prepped with chlorhexidine , and draped in the usual sterile fashion using maximum barrier technique (cap and mask, sterile gown, sterile gloves, large sterile sheet, hand hygiene and cutaneous antiseptic). Local anesthesia was attained by infiltration with 1% lidocaine  with epinephrine . Ultrasound demonstrated patency of the right internal jugular vein, and this was documented with an image. Under real-time ultrasound guidance, this vein was accessed with a 21 gauge micropuncture needle and image documentation was performed. A small dermatotomy was made at the access site with an 11 scalpel. A 0.018" wire was advanced into the SVC and the access needle exchanged  for a 67F micropuncture vascular sheath. The 0.018" wire was then removed and a 0.035" wire advanced into the IVC. An appropriate location for the subcutaneous reservoir was selected below the clavicle and an incision was made through the skin and underlying soft tissues. The subcutaneous tissues were then dissected using a combination of blunt and sharp surgical technique and a pocket was formed. An [employer] single lumen power injectable portacatheter was then tunneled through the subcutaneous tissues from the pocket to the dermatotomy and the port reservoir placed within the subcutaneous pocket. The venous access site was then serially dilated and a peel away vascular sheath placed over the wire. The wire was removed and the port catheter advanced into position under fluoroscopic guidance. The catheter tip is positioned in the superior cavoatrial junction. This was documented with a spot image. The portacatheter was then tested and found to flush and aspirate well. The port was flushed with saline followed by 100 units/mL heparinized saline. The pocket was then closed in two layers using first subdermal inverted interrupted absorbable sutures followed by a running  subcuticular suture. The epidermis was then sealed with Dermabond. The dermatotomy at the venous access site was also closed with Dermabond. IMPRESSION: Successful placement of a right IJ approach [employer] with ultrasound and fluoroscopic guidance. The catheter is ready for use. Electronically Signed   By: Fernando Hoyer M.D.   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    The patient received 1 cycle of systemic chemotherapy at [facility] cancer center but this was complicated with prolonged hospitalization and C. difficile infection.   She underwent cycle #2 on 02/17/2024 with dose reduced chemotherapy with carboplatin  for AUC of 4 and etoposide  80 mg/m.  Her treatment was interrupted due to brief hospitalization for hyperglycemia.  She is here today to consider undergoing the need to and 3 of cycle 2.  Labs were reviewed.  Recommend that she* *  I have added standing orders for sample blood bank and we will arrange for ***  Decadron ?  Referral to nutrition?  Liver Transplant Status Liver transplant 26 years ago due to hepatitis C from a blood transfusion. On tacrolimus  for immunosuppression. Liver enlarged with metastases from lung cancer.   Diabetes Mellitus Diabetes mellitus managed with medication  The patient was advised to call immediately if she has any concerning symptoms in the interval. The patient voices understanding of current disease status and treatment options and is in agreement with the  current care plan. All questions were answered. The patient knows to call the clinic with any problems, questions or concerns. We can certainly see the patient much sooner if necessary     No orders of the defined types were placed in this encounter.    I spent {CHL ONC TIME VISIT - AOZHY:[id number]} counseling the patient face to face. The total time spent in the appointment was {CHL ONC TIME VISIT - XBMWU:[id number]}.  Raschelle Wisenbaker L Yocelyn Brocious, PA-C 02/22/24

## 2024-02-24 ENCOUNTER — Other Ambulatory Visit

## 2024-02-26 ENCOUNTER — Inpatient Hospital Stay: Admitting: Internal Medicine

## 2024-02-26 ENCOUNTER — Other Ambulatory Visit: Payer: Self-pay

## 2024-02-26 ENCOUNTER — Inpatient Hospital Stay

## 2024-02-26 ENCOUNTER — Inpatient Hospital Stay: Admitting: Physician Assistant

## 2024-02-26 VITALS — BP 137/69 | HR 96 | Temp 97.7°F | Resp 15 | Wt 145.7 lb

## 2024-02-26 DIAGNOSIS — Z87891 Personal history of nicotine dependence: Secondary | ICD-10-CM | POA: Diagnosis not present

## 2024-02-26 DIAGNOSIS — Z79899 Other long term (current) drug therapy: Secondary | ICD-10-CM | POA: Diagnosis not present

## 2024-02-26 DIAGNOSIS — Z95828 Presence of other vascular implants and grafts: Secondary | ICD-10-CM

## 2024-02-26 DIAGNOSIS — C3411 Malignant neoplasm of upper lobe, right bronchus or lung: Secondary | ICD-10-CM | POA: Diagnosis not present

## 2024-02-26 DIAGNOSIS — C787 Secondary malignant neoplasm of liver and intrahepatic bile duct: Secondary | ICD-10-CM | POA: Diagnosis not present

## 2024-02-26 DIAGNOSIS — D649 Anemia, unspecified: Secondary | ICD-10-CM

## 2024-02-26 DIAGNOSIS — T451X5A Adverse effect of antineoplastic and immunosuppressive drugs, initial encounter: Secondary | ICD-10-CM | POA: Diagnosis not present

## 2024-02-26 DIAGNOSIS — I10 Essential (primary) hypertension: Secondary | ICD-10-CM | POA: Diagnosis not present

## 2024-02-26 DIAGNOSIS — C7951 Secondary malignant neoplasm of bone: Secondary | ICD-10-CM | POA: Diagnosis not present

## 2024-02-26 DIAGNOSIS — R634 Abnormal weight loss: Secondary | ICD-10-CM

## 2024-02-26 DIAGNOSIS — D6481 Anemia due to antineoplastic chemotherapy: Secondary | ICD-10-CM | POA: Diagnosis not present

## 2024-02-26 DIAGNOSIS — Z5112 Encounter for antineoplastic immunotherapy: Secondary | ICD-10-CM | POA: Diagnosis not present

## 2024-02-26 DIAGNOSIS — E119 Type 2 diabetes mellitus without complications: Secondary | ICD-10-CM | POA: Diagnosis not present

## 2024-02-26 DIAGNOSIS — Z79624 Long term (current) use of inhibitors of nucleotide synthesis: Secondary | ICD-10-CM | POA: Diagnosis not present

## 2024-02-26 DIAGNOSIS — R059 Cough, unspecified: Secondary | ICD-10-CM | POA: Diagnosis not present

## 2024-02-26 DIAGNOSIS — Z79621 Long term (current) use of calcineurin inhibitor: Secondary | ICD-10-CM | POA: Diagnosis not present

## 2024-02-26 DIAGNOSIS — C3491 Malignant neoplasm of unspecified part of right bronchus or lung: Secondary | ICD-10-CM | POA: Diagnosis not present

## 2024-02-26 DIAGNOSIS — Z5111 Encounter for antineoplastic chemotherapy: Secondary | ICD-10-CM | POA: Diagnosis not present

## 2024-02-26 DIAGNOSIS — Z79631 Long term (current) use of antimetabolite agent: Secondary | ICD-10-CM | POA: Diagnosis not present

## 2024-02-26 DIAGNOSIS — D696 Thrombocytopenia, unspecified: Secondary | ICD-10-CM | POA: Diagnosis not present

## 2024-02-26 LAB — CBC WITH DIFFERENTIAL (CANCER CENTER ONLY)
Abs Immature Granulocytes: 0.02 10*3/uL (ref 0.00–0.07)
Basophils Absolute: 0 10*3/uL (ref 0.0–0.1)
Basophils Relative: 1 %
Eosinophils Absolute: 0.1 10*3/uL (ref 0.0–0.5)
Eosinophils Relative: 1 %
HCT: 21.8 % — ABNORMAL LOW (ref 36.0–46.0)
Hemoglobin: 6.8 g/dL — CL (ref 12.0–15.0)
Immature Granulocytes: 0 %
Lymphocytes Relative: 10 %
Lymphs Abs: 0.6 10*3/uL — ABNORMAL LOW (ref 0.7–4.0)
MCH: 28 pg (ref 26.0–34.0)
MCHC: 31.2 g/dL (ref 30.0–36.0)
MCV: 89.7 fL (ref 80.0–100.0)
Monocytes Absolute: 0.5 10*3/uL (ref 0.1–1.0)
Monocytes Relative: 8 %
Neutro Abs: 5.1 10*3/uL (ref 1.7–7.7)
Neutrophils Relative %: 80 %
Platelet Count: 54 10*3/uL — ABNORMAL LOW (ref 150–400)
RBC: 2.43 MIL/uL — ABNORMAL LOW (ref 3.87–5.11)
RDW: 16.8 % — ABNORMAL HIGH (ref 11.5–15.5)
WBC Count: 6.4 10*3/uL (ref 4.0–10.5)
nRBC: 0 % (ref 0.0–0.2)

## 2024-02-26 LAB — CMP (CANCER CENTER ONLY)
ALT: 17 U/L (ref 0–44)
AST: 14 U/L — ABNORMAL LOW (ref 15–41)
Albumin: 2.9 g/dL — ABNORMAL LOW (ref 3.5–5.0)
Alkaline Phosphatase: 564 U/L — ABNORMAL HIGH (ref 38–126)
Anion gap: 6 (ref 5–15)
BUN: 26 mg/dL — ABNORMAL HIGH (ref 8–23)
CO2: 21 mmol/L — ABNORMAL LOW (ref 22–32)
Calcium: 8.3 mg/dL — ABNORMAL LOW (ref 8.9–10.3)
Chloride: 110 mmol/L (ref 98–111)
Creatinine: 1.42 mg/dL — ABNORMAL HIGH (ref 0.44–1.00)
GFR, Estimated: 40 mL/min — ABNORMAL LOW (ref >60)
Glucose, Bld: 228 mg/dL — ABNORMAL HIGH (ref 70–99)
Potassium: 4.6 mmol/L (ref 3.5–5.1)
Sodium: 137 mmol/L (ref 135–145)
Total Bilirubin: 0.8 mg/dL (ref 0.0–1.2)
Total Protein: 7.4 g/dL (ref 6.5–8.1)

## 2024-02-26 LAB — SAMPLE TO BLOOD BANK

## 2024-02-26 LAB — PREPARE RBC (CROSSMATCH)

## 2024-02-26 MED ORDER — SODIUM CHLORIDE 0.9% FLUSH
10.0000 mL | INTRAVENOUS | Status: AC | PRN
Start: 2024-02-26 — End: 2024-02-26
  Administered 2024-02-26: 10 mL

## 2024-02-26 MED ORDER — SODIUM CHLORIDE 0.9% IV SOLUTION
250.0000 mL | Freq: Once | INTRAVENOUS | Status: AC
  Administered 2024-02-26: 100 mL via INTRAVENOUS

## 2024-02-26 MED ORDER — ACETAMINOPHEN 325 MG PO TABS
650.0000 mg | ORAL_TABLET | Freq: Once | ORAL | Status: AC
  Administered 2024-02-26: 650 mg via ORAL
  Filled 2024-02-26: qty 2

## 2024-02-26 MED ORDER — BENZONATATE 100 MG PO CAPS: 100.0000 mg | ORAL_CAPSULE | Freq: Three times a day (TID) | ORAL | 2 refills | Status: DC | PRN

## 2024-02-26 MED ORDER — SODIUM CHLORIDE 0.9% FLUSH
10.0000 mL | Freq: Once | INTRAVENOUS | Status: AC
Start: 2024-02-26 — End: 2024-02-26
  Administered 2024-02-26: 10 mL

## 2024-02-26 MED ORDER — HEPARIN SOD (PORK) LOCK FLUSH 100 UNIT/ML IV SOLN
500.0000 [IU] | Freq: Every day | INTRAVENOUS | Status: AC | PRN
  Administered 2024-02-26: 500 [IU]

## 2024-02-26 MED ORDER — DIPHENHYDRAMINE HCL 25 MG PO CAPS
25.0000 mg | ORAL_CAPSULE | Freq: Once | ORAL | Status: AC
  Administered 2024-02-26: 25 mg via ORAL
  Filled 2024-02-26: qty 1

## 2024-02-26 NOTE — Progress Notes (Signed)
 Spoke with Athena Bland in the blood bank and confirmed blood orders for 1 unit.

## 2024-02-26 NOTE — Progress Notes (Signed)
 Spoke with Kathryn Walsh in the blood bank and confirmed 1 unit of blood for tomorrow.

## 2024-02-26 NOTE — Patient Instructions (Signed)

## 2024-02-27 ENCOUNTER — Inpatient Hospital Stay

## 2024-02-27 LAB — TYPE AND SCREEN
ABO/RH(D): A POS
Antibody Screen: POSITIVE
DAT, IgG: NEGATIVE
Donor AG Type: NEGATIVE
Donor AG Type: NEGATIVE
Unit division: 0
Unit division: 0

## 2024-02-27 LAB — BPAM RBC
Blood Product Expiration Date: 202505202359
Blood Product Expiration Date: 202505302359
ISSUE DATE / TIME: 202504301233
ISSUE DATE / TIME: 202504301424
Unit Type and Rh: 6200
Unit Type and Rh: 6200

## 2024-02-28 ENCOUNTER — Telehealth: Payer: Self-pay

## 2024-02-28 ENCOUNTER — Ambulatory Visit

## 2024-02-28 ENCOUNTER — Encounter: Payer: Self-pay | Admitting: Internal Medicine

## 2024-02-28 NOTE — Telephone Encounter (Signed)
 Spoke with patient in regards to treatment.  Patient reports that she "does not want to continue chemotherapy because every time I take it something ain't right.  The first time my blood sugar went up and then this past time I had to have blood."  Informed patient that information will be relayed to providers.    Informed patient to keep port flush with lab appt on 5/7 and 5/12 plus appt with Dr. Marguerita Shih on 5/13 to speak about next steps.  Infusion appts will remain the same until spoken with Dr. Marguerita Shih on 5/13. Patient verbalized understanding.

## 2024-03-02 ENCOUNTER — Telehealth: Payer: Self-pay | Admitting: *Deleted

## 2024-03-02 ENCOUNTER — Other Ambulatory Visit

## 2024-03-02 NOTE — Transitions of Care (Post Inpatient/ED Visit) (Signed)
   03/02/2024  Name: Joselyn Freestone MRN: [id number] DOB: February 20, 1956  Today's TOC FU Call Status:    Second outreach attempt to reach the patient for week 2 of TOC 30-day program.  Follow Up Plan: Additional outreach attempts will be made to reach the patient to complete the Transitions of Care (Post Inpatient/ED visit) call.   Arna Better RN, BSN [employer]  [facility] RN Care Manager [phone]

## 2024-03-03 ENCOUNTER — Telehealth: Payer: Self-pay | Admitting: *Deleted

## 2024-03-03 NOTE — Patient Instructions (Signed)
 Visit Information  Thank you for taking time to visit with me today. Please don't hesitate to contact me if I can be of assistance to you before our next scheduled telephone appointment.  Our next appointment is by telephone on 03/11/24 at 3:45pm  Following is a copy of your care plan:   Goals Addressed             This Visit's Progress    [facility] Transitions of Care (TOC) Care Plan       Problems:  Recent Hospitalization for treatment of  Hyperglycemia Knowledge deficit  Goal:  Over the next 30 days, the patient will not experience hospital readmission  Interventions: new goal  Diabetes Interventions: Assessed patient's understanding of A1c goal: <7% Reviewed medications with patient and discussed importance of medication adherence Reviewed scheduled/upcoming provider appointments including: 03/04/24-Port Flush and Dexa scan, 03/09/24-Port Flush and 03/10/24 with Dr. Donn Fury Review of patient status, including review of consultants reports, relevant laboratory and other test results, and medications completed Advised patient to take FreeStyle Libre to provider appointment for assistance with use Lab Results  Component Value Date   HGBA1C 8.0 (H) 02/18/2024    Patient Self Care Activities:  Attend all scheduled provider appointments Call provider office for new concerns or questions  Participate in Transition of Care Program/Attend TOC scheduled calls Perform all self care activities independently  Take medications as prescribed   enter blood sugar readings and medication or insulin  into daily log take the blood sugar log to all doctor visits drink 6 to 8 glasses of water each day fill half of plate with vegetables  Plan:  Telephone follow up appointment with care management team member scheduled for:  03/11/24 at 3:45 pm         Patient verbalizes understanding of instructions and care plan provided today and agrees to view in MyChart. Active MyChart status and patient  understanding of how to access instructions and care plan via MyChart confirmed with patient.     Telephone follow up appointment with care management team member scheduled for:03/11/24 at 3:45pm  Please call the care guide team at [phone] if you need to cancel or reschedule your appointment.   Please call [phone]-TALK (toll free, 24 hour hotline) go to [facility] [address] [place] [phone]) call 911 if you are experiencing a Mental Health or Behavioral Health Crisis or need someone to talk to.  Arna Better RN, BSN [place]  [facility] RN Care Manager [phone]

## 2024-03-03 NOTE — Transitions of Care (Post Inpatient/ED Visit) (Signed)
 Transition of Care week 2  Visit Note  03/03/2024  Name: Kathryn Walsh MRN: [id number]          DOB: Nov 23, 1955  Situation: Patient enrolled in [facility] 30-day program. Visit completed with Ms. Boston Byers by telephone.   Background:   Initial Transition Care Management Follow-up Telephone Call    Past Medical History:  Diagnosis Date   Diabetes mellitus without complication (HCC)    High cholesterol    HTN (hypertension)    Liver transplant recipient [facility])     Assessment: Patient Reported Symptoms: Cognitive Cognitive Status: Able to follow simple commands, Alert and oriented to person, place, and time, Insightful and able to interpret abstract concepts, Normal speech and language skills   Health Maintenance Behaviors: Annual physical exam, Healthy diet Healing Pattern: Average Health Facilitated by: Pain control, Rest, Healthy diet  Neurological Neurological Review of Symptoms: No symptoms reported    HEENT HEENT Symptoms Reported: No symptoms reported      Cardiovascular Cardiovascular Symptoms Reported: Fatigue Does patient have uncontrolled Hypertension?: No    Respiratory Respiratory Symptoms Reported: Dry cough Additional Respiratory Details: Primary small cell carcinoma of right lung Respiratory Self-Management Outcome: 3 (uncertain) Respiratory Comment: Patient does not want to continue with chemotherapy  Endocrine Patient reports the following symptoms related to hypoglycemia or hyperglycemia : No symptoms reported Is patient diabetic?: Yes Is patient checking blood sugars at home?: Yes Endocrine Conditions: Diabetes Endocrine Management Strategies: Routine screening, Medication therapy, Diet modification, Medical device Endocrine Self-Management Outcome: 4 (good)  Gastrointestinal Gastrointestinal Symptoms Reported: No symptoms reported      Genitourinary Genitourinary Symptoms Reported: No symptoms reported    Integumentary Integumentary Symptoms Reported:  No symptoms reported    Musculoskeletal Musculoskelatal Symptoms Reviewed: No symptoms reported   Falls in the past year?: No    Psychosocial Psychosocial Symptoms Reported: No symptoms reported         There were no vitals filed for this visit.  Medications Reviewed Today     Reviewed by Aura Leeds, RN (Registered Nurse) on 03/03/24 at 1622  Med List Status: <None>   Medication Order Taking? Sig Documenting Provider Last Dose Status Informant  amLODipine  (NORVASC ) 5 MG tablet [id number] Yes TAKE 1 TABLET BY MOUTH TWICE A DAY Sanders, Robyn, MD Taking Active Self, Pharmacy Records, Multiple Informants  azaTHIOprine  (IMURAN ) 50 MG tablet [id number] Yes Take 100 mg by mouth 2 (two) times daily.  [provider] Taking Active Self, Pharmacy Records, Multiple Informants           Med Note Baltazar Leventhal, [place]   Tue Feb 18, 2024 11:46 AM)    benzonatate  (TESSALON ) 100 MG capsule [id number] No Take 1 capsule (100 mg total) by mouth 3 (three) times daily as needed.  Patient not taking: Reported on 03/03/2024   Heilingoetter, Cassandra L, PA-C Not Taking Active   Cholecalciferol  125 MCG (5000 UT) TABS [id number] No Take 5,000 Units by mouth daily.  Patient not taking: Reported on 02/21/2024   [provider] Not Taking Active Self, Pharmacy Records, Multiple Informants  Continuous Glucose Receiver (FREESTYLE LIBRE 3 READER) [place] [id number] No 1 each by Does not apply route daily in the afternoon.  Patient not taking: Reported on 03/03/2024   Maggie Schooner, MD Not Taking Active   Continuous Glucose Sensor (FREESTYLE LIBRE 3 SENSOR) [place] [id number] No 1 Units by Does not apply route daily in the afternoon. Place 1 sensor on the skin every 14 days. Use to check glucose continuously  Patient not taking: Reported on 03/03/2024   Maggie Schooner, MD Not Taking Active   dapagliflozin  propanediol (FARXIGA ) 10 MG TABS tablet [id number] Yes Take 1 tablet (10 mg total) by mouth daily before  breakfast. [name], MD Taking Active Self, Pharmacy Records, Multiple Informants  diltiazem (CARDIZEM CD) 240 MG 24 hr capsule [id number] No Take 240 mg by mouth daily.  Patient not taking: Reported on 03/03/2024   [provider] Not Taking Active Self, Pharmacy Records, Multiple Informants  gabapentin  (NEURONTIN ) 300 MG capsule [id number] No Take 1 capsule by mouth at bedtime.  Patient not taking: Reported on 03/03/2024   [provider] Not Taking Consider Medication Status and Discontinue (Completed Course) Self, Pharmacy Records, Multiple Informants  HYDROmorphone  (DILAUDID ) 4 MG tablet [id number] Yes Take 4 mg by mouth every 4 (four) hours as needed for severe pain (pain score 7-10). [provider] Taking Active Self, Pharmacy Records, Multiple Informants  hydrOXYzine  (ATARAX ) 25 MG tablet [id number] No Take 25 mg by mouth 3 (three) times daily as needed for anxiety.  Patient not taking: Reported on 02/26/2024   [provider] Not Taking Active Self, Pharmacy Records, Multiple Informants  insulin  aspart (NOVOLOG ) 100 UNIT/ML FlexPen [id number] Yes Inject 3 Units into the skin 3 (three) times daily with meals. If eating and Blood Glucose (BG) 80 or higher inject 3 units for meal coverage and add correction dose per scale. If not eating, correction dose only. BG <150= 0 unit; BG 150-200= 1 unit; BG 201-250= 3 unit; BG 251-300= 5 unit; BG 301-350= 7 unit; BG 351-400= 9 unit; BG >400= 11 unit and Call Primary Care. Maggie Schooner, MD Taking Active   insulin  degludec (TRESIBA  FLEXTOUCH) 200 UNIT/ML FlexTouch Pen [id number] Yes Inject 28 Units into the skin daily at 10 pm. Amin, Ankit C, MD Taking Active   lidocaine -prilocaine  (EMLA ) cream [id number] Yes Apply to affected area once  Patient taking differently: Apply 1 Application topically once. Apply to affected area once   Marlene Simas, MD Taking Active Self, Pharmacy Records, Multiple Informants  losartan   (COZAAR ) 25 MG tablet [id number] No Take 25 mg by mouth daily.  Patient not taking: Reported on 03/03/2024   [provider] Not Taking Active Self, Pharmacy Records, Multiple Informants  morphine  (MSIR) 15 MG tablet [id number] No Take 15 mg by mouth every 4 (four) hours as needed for moderate pain (pain score 4-6) or severe pain (pain score 7-10).  Patient not taking: Reported on 02/21/2024   [provider] Not Taking Active Self, Pharmacy Records, Multiple Informants  ondansetron  (ZOFRAN ) 8 MG tablet [id number] No Take 1 tablet (8 mg total) by mouth every 8 (eight) hours as needed for nausea or vomiting. Start on third day after chemotherapy.  Patient not taking: Reported on 02/21/2024   Marlene Simas, MD Not Taking Active Self, Pharmacy Records, Multiple Informants  ondansetron  (ZOFRAN -ODT) 4 MG disintegrating tablet [id number] No Take 4 mg by mouth every 8 (eight) hours as needed for nausea or vomiting.  Patient not taking: Reported on 02/21/2024   [provider] Not Taking Active Self, Pharmacy Records, Multiple Informants  oxyCODONE  (OXY IR/ROXICODONE ) 5 MG immediate release tablet [id number] No Take 5 mg by mouth every 4 (four) hours as needed for moderate pain (pain score 4-6) or severe pain (pain score 7-10).  Patient not taking: Reported on 03/03/2024   [provider] Not Taking Active Self, Pharmacy Records, Multiple Informants  pravastatin  (PRAVACHOL ) 40 MG tablet [id number] Yes TAKE 1  TABLET BY MOUTH EVERYDAY AT BEDTIME [name], MD Taking Active Self, Pharmacy Records, Multiple Informants  prochlorperazine  (COMPAZINE ) 10 MG tablet [id number] No Take 1 tablet (10 mg total) by mouth every 6 (six) hours as needed for nausea or vomiting (Nausea or vomiting).  Patient not taking: Reported on 02/21/2024   Marlene Simas, MD Not Taking Active Self, Pharmacy Records, Multiple Informants  tacrolimus  (PROGRAF ) 1 MG capsule [id number] Yes Take 2-3 mg by mouth 2  (two) times daily. Take two tablets by mouth in the morning. Take three tablets by mouth in the evening. [provider] Taking Active Self, Pharmacy Records, Multiple Informants  TURMERIC PO [id number] Yes Take 1 capsule by mouth daily at 12 noon. [provider] Taking Active Self, Pharmacy Records, Multiple Informants            Recommendation:   none  Follow Up Plan:   Telephone follow-up in 1 week  Arna Better RN, BSN [place]  [facility] RN Care Manager [phone]

## 2024-03-04 ENCOUNTER — Inpatient Hospital Stay: Attending: Internal Medicine

## 2024-03-04 ENCOUNTER — Other Ambulatory Visit: Payer: Self-pay | Admitting: Physician Assistant

## 2024-03-04 ENCOUNTER — Ambulatory Visit
Admission: RE | Admit: 2024-03-04 | Discharge: 2024-03-04 | Disposition: A | Discharge status: Home / Self Care | Payer: Medicare HMO | Source: Ambulatory Visit | Attending: Internal Medicine | Admitting: Internal Medicine

## 2024-03-04 ENCOUNTER — Telehealth: Payer: Self-pay

## 2024-03-04 DIAGNOSIS — D649 Anemia, unspecified: Secondary | ICD-10-CM

## 2024-03-04 DIAGNOSIS — N958 Other specified menopausal and perimenopausal disorders: Secondary | ICD-10-CM | POA: Diagnosis not present

## 2024-03-04 DIAGNOSIS — C3491 Malignant neoplasm of unspecified part of right bronchus or lung: Secondary | ICD-10-CM

## 2024-03-04 DIAGNOSIS — C7951 Secondary malignant neoplasm of bone: Secondary | ICD-10-CM | POA: Diagnosis not present

## 2024-03-04 DIAGNOSIS — D6481 Anemia due to antineoplastic chemotherapy: Secondary | ICD-10-CM

## 2024-03-04 DIAGNOSIS — Z95828 Presence of other vascular implants and grafts: Secondary | ICD-10-CM

## 2024-03-04 DIAGNOSIS — C3411 Malignant neoplasm of upper lobe, right bronchus or lung: Secondary | ICD-10-CM | POA: Diagnosis not present

## 2024-03-04 DIAGNOSIS — Z452 Encounter for adjustment and management of vascular access device: Secondary | ICD-10-CM | POA: Diagnosis not present

## 2024-03-04 DIAGNOSIS — C787 Secondary malignant neoplasm of liver and intrahepatic bile duct: Secondary | ICD-10-CM | POA: Diagnosis not present

## 2024-03-04 DIAGNOSIS — M8588 Other specified disorders of bone density and structure, other site: Secondary | ICD-10-CM | POA: Diagnosis not present

## 2024-03-04 DIAGNOSIS — E2839 Other primary ovarian failure: Secondary | ICD-10-CM

## 2024-03-04 DIAGNOSIS — E538 Deficiency of other specified B group vitamins: Secondary | ICD-10-CM

## 2024-03-04 DIAGNOSIS — Z944 Liver transplant status: Secondary | ICD-10-CM | POA: Diagnosis not present

## 2024-03-04 LAB — CBC WITH DIFFERENTIAL (CANCER CENTER ONLY)
Abs Immature Granulocytes: 0.02 10*3/uL (ref 0.00–0.07)
Basophils Absolute: 0 10*3/uL (ref 0.0–0.1)
Basophils Relative: 0 %
Eosinophils Absolute: 0 10*3/uL (ref 0.0–0.5)
Eosinophils Relative: 0 %
HCT: 25.9 % — ABNORMAL LOW (ref 36.0–46.0)
Hemoglobin: 8.4 g/dL — ABNORMAL LOW (ref 12.0–15.0)
Immature Granulocytes: 0 %
Lymphocytes Relative: 9 %
Lymphs Abs: 0.4 10*3/uL — ABNORMAL LOW (ref 0.7–4.0)
MCH: 28.4 pg (ref 26.0–34.0)
MCHC: 32.4 g/dL (ref 30.0–36.0)
MCV: 87.5 fL (ref 80.0–100.0)
Monocytes Absolute: 0.4 10*3/uL (ref 0.1–1.0)
Monocytes Relative: 8 %
Neutro Abs: 3.9 10*3/uL (ref 1.7–7.7)
Neutrophils Relative %: 83 %
Platelet Count: 64 10*3/uL — ABNORMAL LOW (ref 150–400)
RBC: 2.96 MIL/uL — ABNORMAL LOW (ref 3.87–5.11)
RDW: 19.1 % — ABNORMAL HIGH (ref 11.5–15.5)
WBC Count: 4.7 10*3/uL (ref 4.0–10.5)
nRBC: 0 % (ref 0.0–0.2)

## 2024-03-04 LAB — VITAMIN B12: Vitamin B-12: 856 pg/mL (ref 180–914)

## 2024-03-04 LAB — IRON AND IRON BINDING CAPACITY (CC-WL,HP ONLY)
Iron: 34 ug/dL (ref 28–170)
Saturation Ratios: 14 % (ref 10.4–31.8)
TIBC: 242 ug/dL — ABNORMAL LOW (ref 250–450)
UIBC: 208 ug/dL (ref 148–442)

## 2024-03-04 LAB — FERRITIN: Ferritin: 980 ng/mL — ABNORMAL HIGH (ref 11–307)

## 2024-03-04 LAB — SAMPLE TO BLOOD BANK

## 2024-03-04 LAB — CMP (CANCER CENTER ONLY)
ALT: 17 U/L (ref 0–44)
AST: 22 U/L (ref 15–41)
Albumin: 2.9 g/dL — ABNORMAL LOW (ref 3.5–5.0)
Alkaline Phosphatase: 451 U/L — ABNORMAL HIGH (ref 38–126)
Anion gap: 8 (ref 5–15)
BUN: 19 mg/dL (ref 8–23)
CO2: 21 mmol/L — ABNORMAL LOW (ref 22–32)
Calcium: 8.3 mg/dL — ABNORMAL LOW (ref 8.9–10.3)
Chloride: 106 mmol/L (ref 98–111)
Creatinine: 1.19 mg/dL — ABNORMAL HIGH (ref 0.44–1.00)
GFR, Estimated: 50 mL/min — ABNORMAL LOW (ref >60)
Glucose, Bld: 216 mg/dL — ABNORMAL HIGH (ref 70–99)
Potassium: 4.2 mmol/L (ref 3.5–5.1)
Sodium: 135 mmol/L (ref 135–145)
Total Bilirubin: 0.8 mg/dL (ref 0.0–1.2)
Total Protein: 7.7 g/dL (ref 6.5–8.1)

## 2024-03-04 LAB — FOLATE: Folate: 5 ng/mL — ABNORMAL LOW (ref >5.9)

## 2024-03-04 MED ORDER — FOLIC ACID 1 MG PO TABS: 1.0000 mg | ORAL_TABLET | Freq: Every day | ORAL | 2 refills | Status: DC

## 2024-03-04 MED ORDER — HEPARIN SOD (PORK) LOCK FLUSH 100 UNIT/ML IV SOLN
500.0000 [IU] | Freq: Once | INTRAVENOUS | Status: AC
Start: 2024-03-04 — End: 2024-03-04
  Administered 2024-03-04: 500 [IU]

## 2024-03-04 MED ORDER — SODIUM CHLORIDE 0.9% FLUSH
10.0000 mL | Freq: Once | INTRAVENOUS | Status: AC
  Administered 2024-03-04: 10 mL

## 2024-03-04 NOTE — Telephone Encounter (Signed)
 Spoke with patients daughter in regards to lab results.  Per Cassie, PA- Folate is low.  Sent in folic acid  to the pharmacy.  Daughter voiced understanding.

## 2024-03-06 ENCOUNTER — Encounter: Payer: Self-pay | Admitting: Internal Medicine

## 2024-03-09 ENCOUNTER — Other Ambulatory Visit

## 2024-03-09 ENCOUNTER — Telehealth: Payer: Self-pay | Admitting: Medical Oncology

## 2024-03-09 ENCOUNTER — Ambulatory Visit: Admitting: Physician Assistant

## 2024-03-09 ENCOUNTER — Inpatient Hospital Stay

## 2024-03-09 ENCOUNTER — Ambulatory Visit

## 2024-03-09 DIAGNOSIS — C7951 Secondary malignant neoplasm of bone: Secondary | ICD-10-CM | POA: Diagnosis not present

## 2024-03-09 DIAGNOSIS — Z95828 Presence of other vascular implants and grafts: Secondary | ICD-10-CM

## 2024-03-09 DIAGNOSIS — C3411 Malignant neoplasm of upper lobe, right bronchus or lung: Secondary | ICD-10-CM | POA: Diagnosis not present

## 2024-03-09 DIAGNOSIS — Z452 Encounter for adjustment and management of vascular access device: Secondary | ICD-10-CM | POA: Diagnosis not present

## 2024-03-09 DIAGNOSIS — D6481 Anemia due to antineoplastic chemotherapy: Secondary | ICD-10-CM

## 2024-03-09 DIAGNOSIS — Z944 Liver transplant status: Secondary | ICD-10-CM | POA: Diagnosis not present

## 2024-03-09 DIAGNOSIS — C787 Secondary malignant neoplasm of liver and intrahepatic bile duct: Secondary | ICD-10-CM | POA: Diagnosis not present

## 2024-03-09 DIAGNOSIS — C3491 Malignant neoplasm of unspecified part of right bronchus or lung: Secondary | ICD-10-CM

## 2024-03-09 LAB — SAMPLE TO BLOOD BANK

## 2024-03-09 LAB — CMP (CANCER CENTER ONLY)
ALT: 30 U/L (ref 0–44)
AST: 43 U/L — ABNORMAL HIGH (ref 15–41)
Albumin: 2.9 g/dL — ABNORMAL LOW (ref 3.5–5.0)
Alkaline Phosphatase: 565 U/L — ABNORMAL HIGH (ref 38–126)
Anion gap: 6 (ref 5–15)
BUN: 25 mg/dL — ABNORMAL HIGH (ref 8–23)
CO2: 22 mmol/L (ref 22–32)
Calcium: 8.6 mg/dL — ABNORMAL LOW (ref 8.9–10.3)
Chloride: 104 mmol/L (ref 98–111)
Creatinine: 1.25 mg/dL — ABNORMAL HIGH (ref 0.44–1.00)
GFR, Estimated: 47 mL/min — ABNORMAL LOW (ref >60)
Glucose, Bld: 324 mg/dL — ABNORMAL HIGH (ref 70–99)
Potassium: 4.3 mmol/L (ref 3.5–5.1)
Sodium: 132 mmol/L — ABNORMAL LOW (ref 135–145)
Total Bilirubin: 1.1 mg/dL (ref 0.0–1.2)
Total Protein: 8.2 g/dL — ABNORMAL HIGH (ref 6.5–8.1)

## 2024-03-09 LAB — CBC WITH DIFFERENTIAL (CANCER CENTER ONLY)
Abs Immature Granulocytes: 0.02 10*3/uL (ref 0.00–0.07)
Basophils Absolute: 0 10*3/uL (ref 0.0–0.1)
Basophils Relative: 0 %
Eosinophils Absolute: 0 10*3/uL (ref 0.0–0.5)
Eosinophils Relative: 0 %
HCT: 27 % — ABNORMAL LOW (ref 36.0–46.0)
Hemoglobin: 8.5 g/dL — ABNORMAL LOW (ref 12.0–15.0)
Immature Granulocytes: 0 %
Lymphocytes Relative: 9 %
Lymphs Abs: 0.5 10*3/uL — ABNORMAL LOW (ref 0.7–4.0)
MCH: 28.1 pg (ref 26.0–34.0)
MCHC: 31.5 g/dL (ref 30.0–36.0)
MCV: 89.1 fL (ref 80.0–100.0)
Monocytes Absolute: 0.5 10*3/uL (ref 0.1–1.0)
Monocytes Relative: 9 %
Neutro Abs: 4.5 10*3/uL (ref 1.7–7.7)
Neutrophils Relative %: 82 %
Platelet Count: 74 10*3/uL — ABNORMAL LOW (ref 150–400)
RBC: 3.03 MIL/uL — ABNORMAL LOW (ref 3.87–5.11)
RDW: 20.3 % — ABNORMAL HIGH (ref 11.5–15.5)
WBC Count: 5.5 10*3/uL (ref 4.0–10.5)
nRBC: 0 % (ref 0.0–0.2)

## 2024-03-09 MED ORDER — SODIUM CHLORIDE 0.9% FLUSH
10.0000 mL | Freq: Once | INTRAVENOUS | Status: AC
  Administered 2024-03-09: 10 mL

## 2024-03-09 MED ORDER — HEPARIN SOD (PORK) LOCK FLUSH 100 UNIT/ML IV SOLN
500.0000 [IU] | Freq: Once | INTRAVENOUS | Status: AC
  Administered 2024-03-09: 500 [IU]

## 2024-03-09 NOTE — Telephone Encounter (Signed)
 [name] said Una wants to do a video visit tomorrow and she does not want to take anymore treatment. Appt type changed and infusions cancelled. Labs done today and port flushed.

## 2024-03-09 NOTE — Progress Notes (Signed)
 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  Past medical history of DM, HTN, liver transplant.  Patient received cycle 1 of carboplatin , etoposide , cosela  at [facility].  Hospitalized following first cycle with c diff.  Second hospitalization on 4/21-4/24 with hyperglycemia.    Met with patient in RD office.  Reports that she has decided to not take anymore chemotherapy.  Reports that she is living with her daughter who moved from [place] and has a new (1 month) old baby.  Reports taste alterations (overly salty and sweet).  Vomited after drinking an ensure in the hospital and is not planning on trying anymore protein shakes.  Usually has granola and yogurt  sometimes with fruit for breakfast around 9am.  Then around 1pm will eat some fruit.  Supper recently has been soups (vegeables).  Drinks water during the day.  Denies nausea.  Reports bowel movement daily (normal consistency).    Patient became tearful during visit.     Nutrition Focused Physical Exam:   Deferred today   Medications: tresiba , aspart, folic acid    Labs: from 5/7 Glucose 216, creatinine 1.19   Anthropometrics:   Height: 67 inches Weight: 144 lb 8 oz in RD office 163 lb 12.8 oz on 11/07/23 Patient reports UBW of 167 lb about a year ago BMI: 22  12% weight loss in the last 4 months, significant   Estimated Energy Needs  Kcals: 1625-1950 Protein: 81-98 g Fluid: 1625-1950 ml   NUTRITION DIAGNOSIS: Inadequate oral intake related to cancer and related treatment side effects as evidenced by 12% weight loss in the last 4 months, taste alterations, reduced oral intake    INTERVENTION:  Discussed strategies to help with taste alterations. Handout provided Discussed ways to add calories and protein without increasing blood glucose  Encouraged eating q 2-3 hours Offered LCSW and  Chaplain services to patient and she declined at this time.  Contact information provided   MONITORING, EVALUATION, GOAL: weight trends, intake   Next Visit: Monday, June 9th phone call  Chandria Rookstool B. Zollie Hipp, CSO, LDN Registered Dietitian [phone]

## 2024-03-10 ENCOUNTER — Ambulatory Visit

## 2024-03-10 ENCOUNTER — Inpatient Hospital Stay (HOSPITAL_BASED_OUTPATIENT_CLINIC_OR_DEPARTMENT_OTHER): Admitting: Internal Medicine

## 2024-03-10 ENCOUNTER — Other Ambulatory Visit: Payer: Self-pay | Admitting: Internal Medicine

## 2024-03-10 ENCOUNTER — Telehealth: Payer: Self-pay | Admitting: Medical Oncology

## 2024-03-10 DIAGNOSIS — C3491 Malignant neoplasm of unspecified part of right bronchus or lung: Secondary | ICD-10-CM

## 2024-03-10 NOTE — Progress Notes (Signed)
 [facility] [phone]   [phone]  PROGRESS NOTE FOR TELEMEDICINE VISITS  [name], MD [address] [place] [address]  I connected withNAME@ on 03/10/24 at  8:15 AM EDT by video enabled telemedicine visit and verified that I am speaking with the correct person using two identifiers.   I discussed the limitations, risks, security and privacy concerns of performing an evaluation and management service by telemedicine and the availability of in-person appointments. I also discussed with the patient that there may be a patient responsible charge related to this service. The patient expressed understanding and agreed to proceed.  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  She also has a history of liver transplant.   PRIOR THERAPY: Palliative systemic chemotherapy with dose reduced chemotherapy with carboplatin  AUC of 4 and etoposide  80 mg and Cosela . She is here to finish cycle #2 which was interrupted by hospitalization for hyperglycemia. The patient received 1 cycle of systemic chemotherapy at [facility] cancer center but this was complicated with prolonged hospitalization and C. difficile infection.  This treatment was discontinued based on her request.   CURRENT THERAPY: Hospice and palliative care.  INTERVAL HISTORY: Kathryn Walsh 68 y.o. female has a virtual video visit with me today for evaluation and discussion of her condition.  She was accompanied by her daughter.Discussed the use of AI scribe software for clinical note  transcription with the patient, who gave verbal consent to proceed.  History of Present Illness   Kathryn Walsh is a 68 year old female with extensive stage small cell lung cancer who presents for a virtual follow-up visit. She is accompanied by her daughter.  Diagnosed with extensive stage small cell lung cancer in February 2025, she is currently undergoing palliative systemic chemotherapy with reduced dose carboplatin  and etoposide . She has completed two cycles of chemotherapy. The first cycle was administered during a hospitalization at [facility], which was complicated by a prolonged stay and a Clostridioides difficile infection.  Following the second cycle of chemotherapy, she experienced an increase in blood sugar levels and required a blood transfusion due to a drop in red blood cells. Due to these complications, she has decided to discontinue chemotherapy treatment.  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       MEDICAL HISTORY: Past Medical History:  Diagnosis Date   Diabetes mellitus without complication (HCC)    High cholesterol    HTN (hypertension)    Liver transplant recipient [facility])     ALLERGIES:  has no known allergies.  MEDICATIONS:  Current Outpatient Medications  Medication Sig Dispense Refill   amLODipine  (NORVASC ) 5 MG tablet TAKE 1 TABLET BY MOUTH TWICE A DAY 180 tablet 1   azaTHIOprine  (IMURAN ) 50 MG tablet Take 100 mg by mouth 2 (two) times daily.      benzonatate  (TESSALON ) 100 MG capsule Take 1 capsule (100 mg total) by mouth 3 (three) times daily as needed. (Patient not taking: Reported on 03/03/2024) 30 capsule 2   Cholecalciferol   125 MCG (5000 UT) TABS Take 5,000 Units by mouth daily. (Patient not taking: Reported on 02/21/2024)     Continuous Glucose Receiver (FREESTYLE LIBRE 3 READER) DEVI 1 each by Does not apply route daily in the  afternoon. (Patient not taking: Reported on 03/03/2024) 1 each 0   Continuous Glucose Sensor (FREESTYLE LIBRE 3 SENSOR) MISC 1 Units by Does not apply route daily in the afternoon. Place 1 sensor on the skin every 14 days. Use to check glucose continuously (Patient not taking: Reported on 03/03/2024) 2 each 0   dapagliflozin  propanediol (FARXIGA ) 10 MG TABS tablet Take 1 tablet (10 mg total) by mouth daily before breakfast. 30 tablet 5   diltiazem (CARDIZEM CD) 240 MG 24 hr capsule Take 240 mg by mouth daily. (Patient not taking: Reported on 03/03/2024)     folic acid  (FOLVITE ) 1 MG tablet Take 1 tablet (1 mg total) by mouth daily. 30 tablet 2   gabapentin  (NEURONTIN ) 300 MG capsule Take 1 capsule by mouth at bedtime. (Patient not taking: Reported on 03/03/2024)     HYDROmorphone  (DILAUDID ) 4 MG tablet Take 4 mg by mouth every 4 (four) hours as needed for severe pain (pain score 7-10).     hydrOXYzine  (ATARAX ) 25 MG tablet Take 25 mg by mouth 3 (three) times daily as needed for anxiety. (Patient not taking: Reported on 02/26/2024)     insulin  aspart (NOVOLOG ) 100 UNIT/ML FlexPen Inject 3 Units into the skin 3 (three) times daily with meals. If eating and Blood Glucose (BG) 80 or higher inject 3 units for meal coverage and add correction dose per scale. If not eating, correction dose only. BG <150= 0 unit; BG 150-200= 1 unit; BG 201-250= 3 unit; BG 251-300= 5 unit; BG 301-350= 7 unit; BG 351-400= 9 unit; BG >400= 11 unit and Call Primary Care. 15 mL 0   insulin  degludec (TRESIBA  FLEXTOUCH) 200 UNIT/ML FlexTouch Pen Inject 28 Units into the skin daily at 10 pm.     lidocaine -prilocaine  (EMLA ) cream Apply to affected area once (Patient taking differently: Apply 1 Application topically once. Apply to affected area once) 30 g 3   losartan  (COZAAR ) 25 MG tablet Take 25 mg by mouth daily. (Patient not taking: Reported on 03/03/2024)     morphine  (MSIR) 15 MG tablet Take 15 mg by mouth every 4 (four) hours as needed for  moderate pain (pain score 4-6) or severe pain (pain score 7-10). (Patient not taking: Reported on 02/21/2024)     ondansetron  (ZOFRAN ) 8 MG tablet Take 1 tablet (8 mg total) by mouth every 8 (eight) hours as needed for nausea or vomiting. Start on third day after chemotherapy. (Patient not taking: Reported on 02/21/2024) 30 tablet 1   ondansetron  (ZOFRAN -ODT) 4 MG disintegrating tablet Take 4 mg by mouth every 8 (eight) hours as needed for nausea or vomiting. (Patient not taking: Reported on 02/21/2024)     oxyCODONE  (OXY IR/ROXICODONE ) 5 MG immediate release tablet Take 5 mg by mouth every 4 (four) hours as needed for moderate pain (pain score 4-6) or severe pain (pain score 7-10). (Patient not taking: Reported on 03/03/2024)     pravastatin  (PRAVACHOL ) 40 MG tablet TAKE 1 TABLET BY MOUTH EVERYDAY AT BEDTIME 90 tablet 1   prochlorperazine  (COMPAZINE ) 10 MG tablet Take 1 tablet (10 mg total) by mouth every 6 (six) hours as needed for nausea or vomiting (Nausea or vomiting). (Patient not taking: Reported on 02/21/2024) 30 tablet 1   tacrolimus  (PROGRAF ) 1 MG  capsule Take 2-3 mg by mouth 2 (two) times daily. Take two tablets by mouth in the morning. Take three tablets by mouth in the evening.     TURMERIC PO Take 1 capsule by mouth daily at 12 noon.     No current facility-administered medications for this visit.    SURGICAL HISTORY:  Past Surgical History:  Procedure Laterality Date   IR IMAGING GUIDED PORT INSERTION  02/14/2024   LIVER TRANSPLANTATION      REVIEW OF SYSTEMS:  Constitutional: positive for fatigue Eyes: negative Ears, nose, mouth, throat, and face: negative Respiratory: positive for dyspnea on exertion Cardiovascular: negative Gastrointestinal: negative Genitourinary:negative Integument/breast: negative Hematologic/lymphatic: negative Musculoskeletal:negative Neurological: negative Behavioral/Psych: negative Endocrine: negative Allergic/Immunologic: negative     LABORATORY  DATA: Lab Results  Component Value Date   WBC 5.5 03/09/2024   HGB 8.5 (L) 03/09/2024   HCT 27.0 (L) 03/09/2024   MCV 89.1 03/09/2024   PLT 74 (L) 03/09/2024      Chemistry      Component Value Date/Time   NA 132 (L) 03/09/2024 1013   NA 144 11/07/2023 1522   K 4.3 03/09/2024 1013   CL 104 03/09/2024 1013   CO2 22 03/09/2024 1013   BUN 25 (H) 03/09/2024 1013   BUN 23 11/07/2023 1522   CREATININE 1.25 (H) 03/09/2024 1013   CREATININE 0.83 09/07/2021 0940      Component Value Date/Time   CALCIUM 8.6 (L) 03/09/2024 1013   ALKPHOS 565 (H) 03/09/2024 1013   AST 43 (H) 03/09/2024 1013   ALT 30 03/09/2024 1013   BILITOT 1.1 03/09/2024 1013       RADIOGRAPHIC STUDIES: DG Bone Density Result Date: 03/04/2024 EXAM: DUAL X-RAY ABSORPTIOMETRY (DXA) FOR BONE MINERAL DENSITY 03/04/2024 1:26 pm CLINICAL DATA:  68 year old Female Postmenopausal. Screening for osteoporosis TECHNIQUE: An axial (e.g., hips, spine) and/or appendicular (e.g., radius) exam was performed, as appropriate, using GE Secretary/administrator at [employer] [facility]. Images are obtained for bone mineral density measurement and are not obtained for diagnostic purposes. [id number] Exclusions: Lumbar spine due to degenerative changes. COMPARISON:  07/06/2021. FINDINGS: Scan quality: Good. LEFT FEMORAL NECK: BMD (in g/cm2): 0.663 T-score: -2.7 Z-score: -2.0 LEFT TOTAL HIP: BMD (in g/cm2): 0.594 T-score: -3.3 Z-score: -2.9 RIGHT FEMORAL NECK: BMD (in g/cm2): 0.659 T-score: -2.7 Z-score: -2.0 RIGHT TOTAL HIP: BMD (in g/cm2): 0.672 T-score: -2.7 Z-score: -2.3 DUAL-FEMUR TOTAL MEAN: Rate of change from previous exam: -8.1 % LEFT FOREARM (RADIUS 33%): BMD (in g/cm2): 0.505 T-score: -4.2 Z-score: -3.3 Rate of change from previous exam: No significant rate of change from previous exam. FRAX 10-YEAR PROBABILITY OF FRACTURE: FRAX not reported as the lowest BMD is not in the osteopenia range. IMPRESSION: Osteoporosis  based on BMD. Fracture risk is increased. Increased risk is based on low BMD. RECOMMENDATIONS: 1. All patients should optimize calcium and vitamin D  intake. 2. Consider FDA-approved medical therapies in postmenopausal women and men aged 24 years and older, based on the following: - A hip or vertebral (clinical or morphometric) fracture - T-score less than or equal to -2.5 and secondary causes have been excluded. - Low bone mass (T-score between -1.0 and -2.5) and a 10-year probability of a hip fracture greater than or equal to 3% or a 10-year probability of a major osteoporosis-related fracture greater than or equal to 20% based on the [place] -adapted WHO algorithm. - Clinician judgment and/or patient preferences may indicate treatment for people with 10-year fracture probabilities  above or below these levels 3. Patients with diagnosis of osteoporosis or at high risk for fracture should have regular bone mineral density tests. For patients eligible for Medicare, routine testing is allowed once every 2 years. The testing frequency can be increased to one year for patients who have rapidly progressing disease, those who are receiving or discontinuing medical therapy to restore bone mass, or have additional risk factors. Electronically Signed   By: Dina  Arceo M.D.   On: 03/04/2024 13:49   IR IMAGING GUIDED PORT INSERTION Result Date: 02/14/2024 INDICATION: 68 year old female with small cell lung cancer. She presents to establish durable venous access. EXAM: IMPLANTED PORT A CATH PLACEMENT WITH ULTRASOUND AND FLUOROSCOPIC GUIDANCE MEDICATIONS: None. ANESTHESIA/SEDATION: Versed  1 mg IV; Fentanyl  100 mcg IV; administered by the radiology nurse Moderate Sedation Time:  10 minutes The patient's vital signs and level of consciousness were continuously monitored during the procedure by the interventional radiology nurse under my direct supervision. FLUOROSCOPY: Radiation exposure index: 2 mGy, reference air kerma  COMPLICATIONS: None immediate. PROCEDURE: The right neck and chest was prepped with chlorhexidine , and draped in the usual sterile fashion using maximum barrier technique (cap and mask, sterile gown, sterile gloves, large sterile sheet, hand hygiene and cutaneous antiseptic). Local anesthesia was attained by infiltration with 1% lidocaine  with epinephrine . Ultrasound demonstrated patency of the right internal jugular vein, and this was documented with an image. Under real-time ultrasound guidance, this vein was accessed with a 21 gauge micropuncture needle and image documentation was performed. A small dermatotomy was made at the access site with an 11 scalpel. A 0.018" wire was advanced into the SVC and the access needle exchanged for a 10F micropuncture vascular sheath. The 0.018" wire was then removed and a 0.035" wire advanced into the IVC. An appropriate location for the subcutaneous reservoir was selected below the clavicle and an incision was made through the skin and underlying soft tissues. The subcutaneous tissues were then dissected using a combination of blunt and sharp surgical technique and a pocket was formed. An [employer] single lumen power injectable portacatheter was then tunneled through the subcutaneous tissues from the pocket to the dermatotomy and the port reservoir placed within the subcutaneous pocket. The venous access site was then serially dilated and a peel away vascular sheath placed over the wire. The wire was removed and the port catheter advanced into position under fluoroscopic guidance. The catheter tip is positioned in the superior cavoatrial junction. This was documented with a spot image. The portacatheter was then tested and found to flush and aspirate well. The port was flushed with saline followed by 100 units/mL heparinized saline. The pocket was then closed in two layers using first subdermal inverted interrupted absorbable sutures followed by a running  subcuticular suture. The epidermis was then sealed with Dermabond. The dermatotomy at the venous access site was also closed with Dermabond. IMPRESSION: Successful placement of a right IJ approach [employer] with ultrasound and fluoroscopic guidance. The catheter is ready for use. Electronically Signed   By: Fernando Hoyer M.D.   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  She also has a history of  liver transplant. The patient was undergoing palliative systemic chemotherapy with dose reduced chemotherapy with carboplatin  AUC of 4 and etoposide  80 mg and Cosela . She is here to finish cycle #2 which was interrupted by hospitalization for hyperglycemia. The patient received 1 cycle of systemic chemotherapy at [facility] cancer center but this was complicated with prolonged hospitalization and C. difficile infection.  This treatment was discontinued based on her request.     Extensive stage small cell lung cancer Diagnosed in February 2025, currently on palliative systemic chemotherapy with reduced dose carboplatin  and etoposide . After two cycles, complications included hyperglycemia and anemia requiring transfusion. She has chosen to discontinue chemotherapy due to these complications and the non-curative nature of the treatment. Prognosis is poor with an expected survival of less than three months without further treatment. The aggressive nature of the cancer and limited benefit of resuscitation in the event of cardiac or respiratory arrest were discussed. - Cancel all planned chemotherapy - Consult hospice for home evaluation and support  Liver  transplant Liver transplant secondary to hepatitis C. Immunotherapy is not viable due to transplant history. Chemotherapy poses a risk to liver function, complicating treatment options for lung cancer.  Goals of Care Emphasis on palliative approach due to non-curative cancer. She prefers to remain at home and has agreed to hospice care for comfort and support. In the event of cardiac or respiratory arrest, she wishes to be resuscitated if possible, although benefits are limited given the aggressive nature of her cancer. - Initiate hospice care for home support - Document resuscitation preferences   She was advised to call if she has any concerning symptoms in the interval.  I discussed the assessment and treatment plan with the patient. The patient was provided an opportunity to ask questions and all were answered. The patient agreed with the plan and demonstrated an understanding of the instructions.   The patient was advised to call back or seek an in-person evaluation if the symptoms worsen or if the condition fails to improve as anticipated.  I provided 30 minutes of face-to-face video visit time during this encounter, and > 50% was spent counseling as documented under my assessment & plan.  Aurelio Blower, MD 03/10/2024 8:21 AM  Disclaimer: This note was dictated with voice recognition software. Similar sounding words can inadvertently be transcribed and may not be corrected upon review.

## 2024-03-11 ENCOUNTER — Ambulatory Visit

## 2024-03-11 ENCOUNTER — Encounter: Payer: Self-pay | Admitting: Internal Medicine

## 2024-03-11 ENCOUNTER — Other Ambulatory Visit: Payer: Self-pay | Admitting: Medical Oncology

## 2024-03-11 ENCOUNTER — Inpatient Hospital Stay

## 2024-03-11 ENCOUNTER — Other Ambulatory Visit: Payer: Self-pay | Admitting: *Deleted

## 2024-03-11 NOTE — Transitions of Care (Post Inpatient/ED Visit) (Signed)
  Transition of Care week 3  Visit Note  03/11/2024  Name: Kathryn Walsh MRN: [id number]          DOB: Aug 20, 1956  Situation: Patient enrolled in [facility] 30-day program. Visit completed with Ms. Boston Byers by telephone.   Background:   Initial Transition Care Management Follow-up Telephone Call    Past Medical History:  Diagnosis Date   Diabetes mellitus without complication (HCC)    High cholesterol    HTN (hypertension)    Liver transplant recipient [facility])     Assessment: Patient Reported Symptoms: Cognitive Cognitive Status: Able to follow simple commands, Alert and oriented to person, place, and time, Normal speech and language skills, Insightful and able to interpret abstract concepts      Neurological Neurological Review of Symptoms: No symptoms reported    HEENT HEENT Symptoms Reported: No symptoms reported      Cardiovascular Cardiovascular Symptoms Reported: No symptoms reported    Respiratory Respiratory Symptoms Reported: Dry cough Additional Respiratory Details: Primary small cell carcinoma of right lung Respiratory Self-Management Outcome: 3 (uncertain) Respiratory Comment: Referral to Hospice placed today  Endocrine Patient reports the following symptoms related to hypoglycemia or hyperglycemia : No symptoms reported Is patient diabetic?: Yes Is patient checking blood sugars at home?: Yes Endocrine Conditions: Diabetes Endocrine Self-Management Outcome: 3 (uncertain) Endocrine Comment: Patient has not scheduled with PCP. Referral to Hospice placed today by Oncology  Gastrointestinal Gastrointestinal Symptoms Reported: No symptoms reported      Genitourinary Genitourinary Symptoms Reported: No symptoms reported    Integumentary Integumentary Symptoms Reported: No symptoms reported    Musculoskeletal Musculoskelatal Symptoms Reviewed: No symptoms reported        Psychosocial Psychosocial Symptoms Reported: Not assessed         There were no vitals  filed for this visit.  Medications Reviewed Today   Medications were not reviewed in this encounter   Medications were not reviewed today. Patient was resting.  Recommendation:   none  Follow Up Plan:   Telephone follow-up in 1 week  Arna Better RN, BSN [place]  [facility] Health RN Care Manager [phone]

## 2024-03-11 NOTE — Telephone Encounter (Signed)
 Dtr said to refer pt to [facility]. Mohamed requests Dr Shevlin be attending.   Tracy at intake given referral information. Demographic and note faxed to hospice.

## 2024-03-11 NOTE — Telephone Encounter (Signed)
Referred to hospice.

## 2024-03-11 NOTE — Progress Notes (Signed)
 Dtr notified that I referred Kathryn Walsh to [facility].

## 2024-03-11 NOTE — Patient Instructions (Signed)
 Visit Information  Thank you for taking time to visit with me today. Please don't hesitate to contact me if I can be of assistance to you before our next scheduled telephone appointment.   Following is a copy of your care plan:   Goals Addressed             This Visit's Progress    VBCI Transitions of Care (TOC) Care Plan       Problems:  Recent Hospitalization for treatment of Hyperglycemia Knowledge deficit  Goal:  Over the next 30 days, the patient will not experience hospital readmission  Interventions: new goal  Diabetes Interventions: Assessed patient's understanding of A1c goal: <7% Review of patient status, including review of consultants reports, relevant laboratory and other test results, and medications completed Provided patient with number to [facility] [phone] to call for Tacrolimus  refill RNCM will follow up next week for status of Hospice referral  Lab Results  Component Value Date   HGBA1C 8.0 (H) 02/18/2024    Patient Self Care Activities:  Attend all scheduled provider appointments Call provider office for new concerns or questions  Participate in Transition of Care Program/Attend TOC scheduled calls Perform all self care activities independently  Take medications as prescribed   enter blood sugar readings and medication or insulin  into daily log take the blood sugar log to all doctor visits drink 6 to 8 glasses of water each day fill half of plate with vegetables  Plan:  Telephone follow up appointment with care management team member scheduled for:  03/18/24 at 2:30 pm         Patient verbalizes understanding of instructions and care plan provided today and agrees to view in MyChart. Active MyChart status and patient understanding of how to access instructions and care plan via MyChart confirmed with patient.     Telephone follow up appointment with care management team member scheduled for:03/18/24 at 2:30pm  Please call the  care guide team at [phone] if you need to cancel or reschedule your appointment.   Please call [phone]-TALK (toll free, 24 hour hotline) go to [facility] Urgent [facility] [address] [place] [phone]) call 911 if you are experiencing a Mental Health or Behavioral Health Crisis or need someone to talk to.  Arna Better RN, BSN [place]  [facility] Health RN Care Manager [phone]

## 2024-03-12 ENCOUNTER — Other Ambulatory Visit: Payer: Self-pay | Admitting: Internal Medicine

## 2024-03-12 ENCOUNTER — Ambulatory Visit

## 2024-03-16 ENCOUNTER — Other Ambulatory Visit

## 2024-03-17 ENCOUNTER — Encounter: Payer: Self-pay | Admitting: Internal Medicine

## 2024-03-18 ENCOUNTER — Other Ambulatory Visit

## 2024-03-18 ENCOUNTER — Ambulatory Visit: Admitting: Physician Assistant

## 2024-03-18 ENCOUNTER — Ambulatory Visit

## 2024-03-18 ENCOUNTER — Other Ambulatory Visit: Payer: Self-pay | Admitting: *Deleted

## 2024-03-18 NOTE — Transitions of Care (Post Inpatient/ED Visit) (Signed)
   03/18/2024  Name: Kathryn Walsh MRN: [id number] DOB: [birth year]  RNCM spoke with DPR/[name], verified Hospice involvement. Case Closure performed.   Arna Better RN, BSN [place]  [facility] Health RN Care Manager [phone]

## 2024-03-19 ENCOUNTER — Ambulatory Visit

## 2024-03-20 ENCOUNTER — Ambulatory Visit

## 2024-03-24 ENCOUNTER — Other Ambulatory Visit

## 2024-03-25 ENCOUNTER — Inpatient Hospital Stay

## 2024-03-25 DIAGNOSIS — N179 Acute kidney failure, unspecified: Secondary | ICD-10-CM | POA: Diagnosis not present

## 2024-03-25 DIAGNOSIS — E1010 Type 1 diabetes mellitus with ketoacidosis without coma: Secondary | ICD-10-CM | POA: Diagnosis not present

## 2024-03-25 DIAGNOSIS — K6389 Other specified diseases of intestine: Secondary | ICD-10-CM | POA: Diagnosis not present

## 2024-03-25 DIAGNOSIS — Z944 Liver transplant status: Secondary | ICD-10-CM | POA: Diagnosis not present

## 2024-03-25 DIAGNOSIS — C3411 Malignant neoplasm of upper lobe, right bronchus or lung: Secondary | ICD-10-CM | POA: Diagnosis not present

## 2024-03-25 DIAGNOSIS — Z859 Personal history of malignant neoplasm, unspecified: Secondary | ICD-10-CM | POA: Diagnosis not present

## 2024-03-25 DIAGNOSIS — D849 Immunodeficiency, unspecified: Secondary | ICD-10-CM | POA: Diagnosis not present

## 2024-03-25 DIAGNOSIS — E875 Hyperkalemia: Secondary | ICD-10-CM | POA: Diagnosis not present

## 2024-03-25 DIAGNOSIS — G319 Degenerative disease of nervous system, unspecified: Secondary | ICD-10-CM | POA: Diagnosis not present

## 2024-03-25 DIAGNOSIS — Z794 Long term (current) use of insulin: Secondary | ICD-10-CM | POA: Diagnosis not present

## 2024-03-25 DIAGNOSIS — R918 Other nonspecific abnormal finding of lung field: Secondary | ICD-10-CM | POA: Diagnosis not present

## 2024-03-25 DIAGNOSIS — C7951 Secondary malignant neoplasm of bone: Secondary | ICD-10-CM | POA: Diagnosis not present

## 2024-03-25 DIAGNOSIS — I10 Essential (primary) hypertension: Secondary | ICD-10-CM | POA: Diagnosis not present

## 2024-03-25 DIAGNOSIS — R739 Hyperglycemia, unspecified: Secondary | ICD-10-CM | POA: Diagnosis not present

## 2024-03-25 DIAGNOSIS — K7460 Unspecified cirrhosis of liver: Secondary | ICD-10-CM | POA: Diagnosis not present

## 2024-03-25 DIAGNOSIS — G9341 Metabolic encephalopathy: Secondary | ICD-10-CM | POA: Diagnosis not present

## 2024-03-25 DIAGNOSIS — R188 Other ascites: Secondary | ICD-10-CM | POA: Diagnosis not present

## 2024-03-25 DIAGNOSIS — C771 Secondary and unspecified malignant neoplasm of intrathoracic lymph nodes: Secondary | ICD-10-CM | POA: Diagnosis not present

## 2024-03-25 DIAGNOSIS — C787 Secondary malignant neoplasm of liver and intrahepatic bile duct: Secondary | ICD-10-CM | POA: Diagnosis not present

## 2024-03-25 DIAGNOSIS — R531 Weakness: Secondary | ICD-10-CM | POA: Diagnosis not present

## 2024-03-25 DIAGNOSIS — E0810 Diabetes mellitus due to underlying condition with ketoacidosis without coma: Secondary | ICD-10-CM | POA: Diagnosis not present

## 2024-03-25 DIAGNOSIS — N200 Calculus of kidney: Secondary | ICD-10-CM | POA: Diagnosis not present

## 2024-03-25 DIAGNOSIS — C3490 Malignant neoplasm of unspecified part of unspecified bronchus or lung: Secondary | ICD-10-CM | POA: Diagnosis not present

## 2024-03-25 DIAGNOSIS — R4182 Altered mental status, unspecified: Secondary | ICD-10-CM | POA: Diagnosis not present

## 2024-03-25 DIAGNOSIS — E1110 Type 2 diabetes mellitus with ketoacidosis without coma: Secondary | ICD-10-CM | POA: Diagnosis not present

## 2024-03-25 DIAGNOSIS — I4430 Unspecified atrioventricular block: Secondary | ICD-10-CM | POA: Diagnosis not present

## 2024-03-25 NOTE — ED Provider Notes (Addendum)
 [facility] [place]  ED Provider Note  Caylah Plouff 68 y.o. female DOB: 1956/05/24 MRN: [id number] History   Chief Complaint  Patient presents with  . Pain    BIB EMS from home reports generalized pain x2days. Reported hospice care stated won't be able to see until tomorrow   . Elevated Blood Sugar (Symptomatic)    Blood glucose on EMS 554. EMS gave calcium and sodium bicarb due to wide QRS complex. Mass on upper abdomen pt reports not being there before    Here via ems with cc of intractable pain, urinary frequency.  Ems note prolonged qt/widening qrs, received calcium/bicarb pta.  Currently in hospice for metastatic cancer.  She has had some treatment at [facility] and [employer] [facility].  EMS had a blood sugar of 554.  Family arrived shortly after and spoke to them over the phone.  They tell me she has been on steroids recently.  Over the last approximate week she has become more confused has had increased urination.  Family expresses frustration that hospice team did not do outpatient labs.  They have not noted any fever or vomiting.  She has new palpable mass in her midline.  Patient has confusion is disoriented to year and month and most of the history is obtained from the family.  The patient states that she would like something for pain.  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   History provided by:  Patient (daughter, son) Pain  Elevated Blood Sugar (Symptomatic)      Past Medical History:  Diagnosis Date  . Cancer (*)   . Diabetes mellitus (*)   . History of liver transplant (*) 1999  . Hypertension     Past Surgical History:  Procedure Laterality Date  . Liver surgery      Social History   Substance and Sexual Activity  Alcohol Use Not Currently   Social History   Tobacco Use  Smoking Status Former  . Types: Cigarettes  Smokeless Tobacco Never   E-Cigarettes  . Vaping  Use Never User   . Start Date    . Cartridges/Day    . Quit Date     Social History   Substance and Sexual Activity  Drug Use Never         No Known Allergies  Home Medications   AMLODIPINE  BESYLATE (NORVASC ) 5 MG TABLET    Take one tablet (5 mg dose) by mouth 2 (two) times daily.   AZATHIOPRINE  (IMURAN ) 50 MG TABLET    Take two tablets (100 mg dose) by mouth 2 (two) times daily.   CYCLOBENZAPRINE  (FLEXERIL ) 10 MG TABLET    Take one tablet (10 mg dose) by mouth at bedtime.   DAPAGLIFLOZIN  (FARXIGA ) 10 MG TABLET    Take one tablet (10 mg dose) by mouth every morning.   DEXAMETHASONE  (DECADRON ) 2 MG TABLET    Take one tablet (2 mg dose) by mouth every morning.   HYDROMORPHONE  (DILAUDID ) 4 MG TABLET    Take one tablet (4 mg dose) by mouth every 4 (four) hours as needed for Pain.   INSULIN  ASPART (NOVOLOG  FLEXPEN RELION) 100 UNIT/ML INJECTION    Inject into the skin with meals as needed for High Blood Sugar. SLIDING SCALE BG<150= 0 UNITS 150-200= 1 UNITS 201-250= 3 UNITS 251-300= 5 UNITS 301-350= 7 UNITS 351-400= 9 UNITS BG>400= 11 UNITS   INSULIN  DEGLUDEC (TRESIBA ) 100 UNIT/ML INJECTION    Inject  twenty Units into the skin at bedtime.   INSULIN  DEGLUDEC (TRESIBA ) 100 UNIT/ML INJECTION    Inject twenty eight Units into the skin at bedtime.   LOSARTAN  POTASSIUM (COZAAR ) 25 MG TABLET    Take one tablet (25 mg dose) by mouth daily.   MAGNESIUM  OXIDE 400 MG TABLET    Take one tablet (400 mg dose) by mouth 2 (two) times daily.   ONDANSETRON  (ZOFRAN -ODT) 4 MG DISINTEGRATING TABLET    Take one tablet (4 mg dose) by mouth every 8 (eight) hours as needed for Nausea.   POLYETHYLENE GLYCOL (MIRALAX) 17 G PACKET    Take seventeen g by mouth 2 (two) times daily.   PRAVASTATIN  SODIUM (PRAVACHOL ) 40 MG TABLET    Take one tablet (40 mg dose) by mouth daily.   SENNOSIDES-DOCUSATE SODIUM (SENNA-DOCUSATE) 8.6-50 MG PER TABLET    Take two tablets by mouth 2 (two) times daily.   TACROLIMUS  (PROGRAF ) 1  MG CAPSULE    Take two capsules (2 mg dose) by mouth 2 (two) times daily.    Primary Survey  Primary Survey  Review of Systems   Review of Systems  Unable to perform ROS: Mental status change    Physical Exam   ED Triage Vitals  BP 03/25/24 1953 124/74  Heart Rate 03/25/24 1953 93  Resp 03/25/24 1953 16  SpO2 03/25/24 1953 100 %  Temp 03/25/24 2000 (!) 93.9 F (34.4 C)    Physical Exam  Nursing note and vitals reviewed. Constitutional: She appears distressed and appears ill.  HENT:  Head: Normocephalic.  Mouth/Throat: Mucous membranes are dry.  Eyes: Scleral icterus is present.  Neck: No JVD.  Cardiovascular: Normal rate, regular rhythm, normal heart sounds and intact distal pulses.  Pulmonary/Chest: Respiratory effort normal and breath sounds normal.  Abdominal: Soft.  Palpable mass mid epigastrium  Neurological: She is alert. Moves all extremities equally. She has normal speech.     ED Course   Lab results:   CBC AND DIFFERENTIAL - Abnormal      Result Value   WBC 20.5 (*)    RBC 3.26 (*)    HGB 9.3 (*)    HCT 31.0 (*)    MCV 95.1 (*)    MCH 28.5     MCHC 30.0 (*)    Plt Ct 88 (*)    Comment: Platelet confirmed on smear.   RDW SD 82.0 (*)    MPV 12.8 (*)    NRBC% 0.7 (*)    Absolute NRBC Count 0.14 (*)    NEUTROPHIL % 91.3     LYMPHOCYTE % 2.1     MONOCYTE % 4.4     Eosinophil % 0.0     BASOPHIL % 0.1     IG% 2.1     ABSOLUTE NEUTROPHIL COUNT 18.72 (*)    ABSOLUTE LYMPHOCYTE COUNT 0.44 (*)    Absolute Monocyte Count 0.91 (*)    Absolute Eosinophil Count 0.00     Absolute Basophil Count 0.03     Absolute Immature Granulocyte Count 0.43 (*)   COMPREHENSIVE METABOLIC PANEL - Abnormal   Na 127 (*)    Potassium 7.6 (*)    Cl 89 (*)    CO2 5 (*)    AGAP 33 (*)    Glucose 574 (*)    BUN 105 (*)    Creatinine 4.83 (*)    Ca 9.3     ALK PHOS 667 (*)    T Bili 6.8 (*)  Total Protein 6.9     Alb 2.0 (*)    GLOBULIN 4.9 (*)     ALBUMIN/GLOBULIN RATIO 0.4 (*)    BUN/CREAT RATIO 21.7     ALT 101 (*)    AST 118 (*)    eGFR 9 (*)    Comment: Normal GFR (glomerular filtration rate) > 60 mL/min/1.73 meters squared, < 60 may include impaired kidney function. Calculation based on the Chronic Kidney Disease Epidemiology Collaboration (CK-EPI)equation refit without adjustment for race.  MAGNESIUM  - Abnormal   Mg 3.3 (*)   PROTIME-INR - Abnormal   PT 27.2 (*)    Comment: Many commonly administered drugs may affect the results obtained in PT and PTT testing.   INR 2.6     Comment: INR Therapeutic Range for DVT, PE:      2.0 - 3.0 INR Mechanical Prosthetic Heart Valves: 2.5 - 3.5  LIPASE - Abnormal   Lipase 100 (*)   LACTIC ACID - Abnormal   Lactic Acid 12.8 (*)   PROCALCITONIN - Abnormal   Procalcitonin 22.30 (*)    Narrative:    PCT  0.09 - 0.50 ------- Low risk for progression to severe systemic infection (severe sepsis/septic shock). Caution:  PCT levels below 0.50 ng.mL do not exclude an infection, because localized infections (without system signs) may be associated with such low levels.  If PCT is measured very early after a bacterial challenge (usually <6 hours), these values may still be low.  In this case, PCT should be re-assessed 6-24 hours later.  >0.50 - 2 --------- Moderate risk for progression to severe systemic infection (severe sepsis/septic shock). The patient should be closely monitored both clinically and by re-assessing PCT within 6-24 hours.  >2 - <10 --------- High risk for progression to severe systemic infection (severe sepsis/septic shock).  >=10 ------------- High likelihood of severe sepsis or septic shock.   POCT GLUCOSE - Abnormal   Glucose, POC >500 (*)    OPERATOR ID [id number]     INSTRUMENT ID [id number]    POCT ISTAT VENOUS PANEL - Abnormal   POC CREAT 5.3 (*)    SAMPLE SOURCE VENOUS     INSTRUMENT ID [id number]     OPERATOR ID [id number]    POCT ISTAT VENOUS PANEL -  Abnormal   POC POTASSIUM 7.5 (*)    SAMPLE SOURCE VENOUS     INSTRUMENT ID [id number]     OPERATOR ID [id number]    BLOOD GAS, VENOUS - Abnormal   PH VENOUS 7.093 (*)    PCO2 VENOUS 22.5 (*)    PO2 VENOUS 44.6     BASE EXCESS VENOUS -21.2 (*)    BICARBONATE VENOUS 7 (*)    PATIENT TEMPERATURE 98.6     OXYGEN SATURATION MEASURED VEN 60.1    TSH - Normal   TSH 0.85    CULTURE, BLOOD  CULTURE, BLOOD  URINALYSIS W/MICRO REFLEX CULTURE - SYMPTOMATIC  REFLEXED LACTIC ACID  HEMOGLOBIN A1C  BASIC METABOLIC PANEL  BASIC METABOLIC PANEL  BASIC METABOLIC PANEL  MAGNESIUM   MAGNESIUM   PHOSPHORUS  PHOSPHORUS  BETA-HYDROXYBUTYRIC ACID  BASIC METABOLIC PANEL  BASIC METABOLIC PANEL  POCT POTASSIUM  POCT ISTAT CREATININE  POCT GLUCOSE  POCT GLUCOSE  POCT GLUCOSE  POCT GLUCOSE  POCT GLUCOSE  POCT GLUCOSE  POCT GLUCOSE  POCT GLUCOSE  POCT GLUCOSE  POCT GLUCOSE  POCT GLUCOSE  POCT GLUCOSE  POCT GLUCOSE  POCT GLUCOSE  POCT GLUCOSE  POCT GLUCOSE  POCT GLUCOSE  POCT GLUCOSE  POCT GLUCOSE  POCT GLUCOSE  POCT GLUCOSE  POCT GLUCOSE  POCT GLUCOSE  POCT GLUCOSE  POCT GLUCOSE    Imaging:   XR CHEST AP PORTABLE   Narrative:    INDICATION: Shortness of breath  TECHNIQUE: XR CHEST AP PORTABLE.   Exam date/time: 03/25/2024 8:17 PM.  Comparison 09/17/2021  FINDINGS: Right upper lobe mass. Normal heart size. Lungs otherwise clear. Right-sided port    Impression:    IMPRESSION: 1. Right upper lobe mass  Electronically Signed by: Glendia Guillaume, MD on 03/25/2024 8:47 PM  CT ABDOMEN PELVIS WO IV CONTRAST      ECG: ECG Results          ECG 12 lead (Final result)  Result time 03/25/24 22:33:33    Final result             Narrative:   Diagnosis Class Abnormal Acquisition Device D3K Systolic BP 121 Diastolic BP 58 Ventricular Rate 185 Atrial Rate 185 QRS Duration 184 Q-T Interval 254 QTC Calculation(Bazett) 445 Calculated R Axis -76 Calculated T Axis 100  Diagnosis  Wide QRS tachycardia with frequent premature ventricular complexes in a pattern of bigeminy Right bundle branch block Left anterior fascicular block ### Bifascicular block ### Septal infarct , age undetermined T wave abnormality, consider inferior ischemia Abnormal ECG When compared with ECG of 08-Jan-2024 20:39, Wide QRS tachycardia has replaced Sinus rhythm Vent. rate has increased BY  82 BPM Donnell Ferretti ([id number]) on 03/25/2024 10:33:23 PM certifies that he/she has reviewed the ECG tracing and confirms the  independent interpretation is correct.                                                                                       Pre-Sedation Critical Care  Performed by: Ferretti ONEIDA Donnell, DO Authorized by: Ferretti ONEIDA Donnell, DO   Critical care provider statement:    Total time providing critical care:  30-74 minutes (45)   Critical care time was exclusive of:  Separately billable procedures and treating other patients   Critical care was necessary to treat or prevent imminent or life-threatening deterioration of the following conditions:  Metabolic crisis   Critical care was time spent personally by me on the following activities:  Development of treatment plan with patient or surrogate, evaluation of patient's response to treatment, re-evaluation of patient's condition, ordering and performing treatments and interventions, examination of patient, review of old charts, ordering and review of radiographic studies, ordering and review of laboratory studies, interpretation of cardiac output measurements and discussions with consultants     Medical Decision Making On arrival patient is exhibiting evidence of hyperkalemia on her EKG already received calcium and sodium bicarbonate.  Spent about 45 minutes discussing with various family members regarding goals of care.  Ultimately around 10 to 10:15 PM they elected to revoke hospice and request  full spectrum of care.  Did discuss with him that this would not reverse her cancer and may prolong suffering as well.  She has an elevated procalcitonin with an AKI and a potassium of 7.6.  I have ordered further treatment with fluids insulin  drip bicarbonate and another dose of calcium here.  Patient is nonfocal moving all extremities but is confused.  Will add on a Noncon CT abdomen pelvis given her AKI.  Of asked nurses to place Foley catheter.  Empiric broad-spectrum antibiotics are being given.    Medications LR bolus 2,274 mL (2,274 mLs IntraVENous New Bag 03/25/24 2234) 0.45% NaCl + KCL 40 mEq infusion (has no administration in time range) 0.45% NaCl infusion (has no administration in time range) D5 - 0.45% NaCl + KCl 40 mEq/L infusion (has no administration in time range) D5-0.45% NaCl infusion (has no administration in time range) ondansetron  (ZOFRAN ) tablet 4 mg (has no administration in time range)   Or ondansetron  (ZOFRAN ) tablet 4 mg (has no administration in time range)   Or ondansetron  (ZOFRAN ) injection 4 mg (has no administration in time range) potassium chloride  10 mEq in SW 100 mL ivpb - premix (has no administration in time range) insulin  regular (HUMAN) (MYXREDLIN) 100 units/100 mL in NS (has no administration in time range) NaCl 0.9 % infusion (has no administration in time range) PHARMACY TO DOSE MED - COMMUNICATION (has no administration in time range) dextrose  (GLUTOSE/SWEET CHEEKS) 40% oral gel 15-30 g (has no administration in time range)   Or dextrose  injection 12.5 g (has no administration in time range)   Or dextrose  injection 25 g (has no administration in time range)   Or glucagon injection 1 mg (has no administration in time range) NaCl 0.9 % infusion (has no administration in time range) calcium gluconate 2 g in saline 100 mL - premix (has no administration in time range) sodium bicarbonate 8.4 % injection 50 mEq (has no administration in time  range) piperacillin-tazobactam (ZOSYN) injection 4.5 g (has no administration in time range) vancomycin  2,000 mg in 0.9% NaCl 500 mL - premix (has no administration in time range) NaCl 0.9% bolus 1,000 mL (0 mLs IntraVENous Stopped 03/25/24 2145) HYDROmorphone  (DILAUDID ) injection 1 mg (1 mg IntraVENous Given 03/25/24 2028) ondansetron  (ZOFRAN ) injection 4 mg (4 mg IntraVENous Given 03/25/24 2028) HYDROmorphone  (DILAUDID ) injection 1 mg (1 mg IntraVENous Given 03/25/24 2122)   Amount and/or Complexity of Data Reviewed External Data Reviewed: labs, radiology and notes.    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  She also has a history of liver transplant. The patient was undergoing palliative systemic chemotherapy with dose reduced chemotherapy with carboplatin  AUC of 4 and etoposide  80 mg and Cosela . She is here to finish cycle #2 which was interrupted by hospitalization for hyperglycemia. The patient received 1 cycle of systemic chemotherapy at [facility] cancer center but this was complicated with prolonged hospitalization and C. difficile infection.  This treatment was discontinued based on her request.      Extensive stage small cell lung cancer Diagnosed in February 2025, currently on palliative systemic chemotherapy with reduced dose carboplatin  and etoposide . After two cycles, complications included hyperglycemia and anemia requiring transfusion. She has chosen to discontinue chemotherapy due to these complications and the non-curative nature of the treatment. Prognosis is poor with an expected survival of less than three months without further treatment. The aggressive  nature of the cancer and limited benefit of resuscitation in the event of cardiac or respiratory arrest were discussed. 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 Immunotherapy is not viable due to transplant history. Chemotherapy poses a risk to liver function, complicating treatment options for lung cancer.   Goals of Care Emphasis on palliative approach due to non-curative cancer. She prefers to remain at home and has agreed to hospice care for comfort and support. In the event of cardiac or respiratory arrest, she wishes to be resuscitated if possible, although benefits are limited given the aggressive nature of her cancer. - Initiate hospice care for home support - Document resuscitation preferences   She was advised to call if she has any concerning symptoms in the interval.   I discussed the assessment and treatment plan with the patient. The patient was provided an opportunity to ask questions and all were answered. The patient agreed with the plan and demonstrated an understanding of the instructions.   The patient was advised to call back or seek an in-person evaluation if the symptoms worsen or if the condition fails to improve as anticipated.   I provided 30 minutes of face-to-face video visit time during this encounter, and > 50% was spent counseling as documented under my assessment & plan.    Labs: ordered. Decision-making details documented in ED Course. Radiology: ordered and independent interpretation performed. Decision-making details documented in ED Course. ECG/medicine tests: ordered and independent interpretation performed. Decision-making details documented in ED Course. Discussion of management or test interpretation with external provider(s): icu  Risk OTC drugs. Prescription drug management. Decision regarding hospitalization.           Provider  Communication  New Prescriptions   No medications on file    Modified Medications   No medications on file    Discontinued Medications   No medications on file    Clinical Impression Final diagnoses:  Diabetic ketoacidosis without coma associated with diabetes mellitus due to underlying condition (*)  AKI (acute kidney injury)  Hyperkalemia  Metabolic encephalopathy    ED Disposition     ED Disposition  Admit   Condition  --   Comment  --                   Electronically signed by:    Lorrene ONEIDA Livings, DO 03/25/24 2238    Lorrene ONEIDA Livings, DO 03/25/24 2238

## 2024-03-29 NOTE — H&P (Signed)
 Readmit under HIP.

## 2024-03-29 NOTE — Nursing Note (Signed)
 Record of Death  MRN: [id number]    Name: Rosalyn Archambault    DOB: 1956-08-02     SSN: [id number]  Date of Death: 05-Apr-2024             Time of Death: 1345  Discharge Location/Unit:: CICU  Death Certificate: Who will sign death certificate?: Amy Gilleland,MD    Contact Notification: Name:: [name] Mohoney Eini Contact Person Relationship to Patient: Daughter Contact Person Phone Number: [phone]  Special Handling: Special Handling: None    Belongings:      Disposition: Release of Body/Permission & Receipt of personal effects form completed and signed?: Yes Disposition: Funeral home/cremation service      Candidate for donation: [employer] Referral Number: [id number] [employer] OPO Representative's Name: Tinnie Peebles ([employer]) Notified by:: Cora Ee, RN    Is patient a potential donor?: Yes Donation Type: Eyes Eye Prep Tasks Performed: Place sterile soaked gauze over eyelids          Medical Examiner/Coroner Case: Reportable Situations:: NA           Autopsy (non-Me/Coroner):  Autopsy Requested (Non-ME/Coroner Cases): Not requested                          Funeral Home/Cremation Services: [facility]/Cremation Service Name:: [phone] [facility]/Cremation [facility] Notified: No Funeral Home/Cremation Service phone number: Abran PARAS. [employer] [facility]   Notifications:       Other caregivers present: RN

## 2024-03-29 NOTE — Discharge Summary (Signed)
 [employer] [facility] MED[facility] CENTER  [facility] Inpatient Discharge Summary  PCP: No primary care provider on file. 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 Follow-Up Appointments Already Scheduled: No future appointments.  Discharge Medications:   Current Discharge Medication List    You have not been prescribed any medications.     Allergies: No Known Allergies  Consultations this Admission: INPATIENT CONSULT TO WOUND OSTOMY IP CONSULT TO CASE MANAGEMENT, RN/SW  Procedures/Imaging:     CT Abdomen Pelvis WO IV Contrast  Final Result  IMPRESSION:     1. Cirrhosis with hepatomegaly.  2. Small to moderate volume ascites.  3. Anasarca  4. Colonic wall thickening which could reflect colitis or portal colopathy.  5. Stable left renal midpole nonobstructive stone.    Electronically Signed by: Dorn Burkes on 04/11/24 5:14 AM    CT Head WO Contrast  Final Result  IMPRESSION:  No acute intracranial abnormality. Cerebral atrophy unchanged.    Multiple calvarial lucencies unchanged raising suspicion for metastatic disease.    Electronically Signed by: Dorn Burkes on 2024-04-11 4:27 AM    XR Chest Ap Portable  Final Result  IMPRESSION:  1.  Right upper lobe mass    Electronically Signed by: Glendia Guillaume, MD on 03/25/2024 8:47 PM      Pertinent Labs:  Cardiac Labs: No results for input(s): CK, CKMB, CTNI, BNP in the last 168 hours. CBC: Recent Labs    Units 03/25/24 2038  WBC thou/mcL 20.5*  HGB gm/dL 9.3*  PLT thou/mcL 88*   BMP: Recent Labs    Units 04/11/2024 0856 04-11-24 0415 04/11/2024 0016 03/25/24 2039 03/25/24 2038 03/25/24 2024  NA mmol/L 132* 134* 131*  --  127*  --   K mmol/L 8.4* 7.7* 8.3* 7.5* 7.6*  --   CL mmol/L 93* 92* 92*  --  89*  --   CO2 mmol/L 7* 5* 4*  --  5*  --   BUN mg/dL 889* 887* 891*  --  894*  --   CREATININE mg/dL 5.29* 5.29* 5.43*  --  4.83* 5.3*  MAGNESIUM  mg/dL 2.9*  --  3.2*  --  3.3*  --    Lipid Panel: No results for input(s): CHOL, TRIG, HDL, LDL in the last 168 hours. Liver Enzymes: Recent Labs    Units 11-Apr-2024 0016 03/25/24 2038  INR   --  2.6  AST U/L 280* 118*  ALT U/L 118* 101*  ALKPHOS U/L 559* 667*  BILITOT mg/dL 6.0* 6.8*   Endocrine Panels: Recent Labs    Units 2024/04/11 1054 04-11-2024 9053 04-11-2024 9143 April 11, 2024 9164 04-11-24 9283 April 11, 2024 9392 2024-04-11 9445 04/11/24 0455 04-11-24 9584  25-Apr-2024 0406 04-25-2024 0303 04/25/2024 0204 03/25/24 2238 03/25/24 2038  TSH mcIU/mL  --   --   --   --   --   --   --   --   --   --   --   --   --  0.85  GLUCOSE mg/dL 838* 812* 807* 805* 770* 235* 273* 289* 295* 313* 368* 425*   < > 574*   < > = values in this interval not displayed.    Hospital Course   Physicians involved in care during this hospitalization Attending Provider: Lorrene ONEIDA Livings, DO Attending Provider: Almeda Winston Daring, MD Attending Provider: Greig Pair, MD Admitting Provider: Alyce Ozell Chancy, MD  HPI per admitting provider: Patient is a 68 year old female with a history of liver transplant in 1999 as well as insulin -dependent diabetes. 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 She had been given 1 round of chemotherapy which was very poorly tolerated and had elected to discontinue treatment. She was discharged to home with hospice assistance. She had been living with her daughter. She had had difficulty with intractable pain not responding to outpatient medications for quite some time now which have gotten progressively worse. She had also had poor p.o. intake and worsening weakness. Approximately 3 days ago she began having some diarrhea and had also had some blood in her urine. Her daughter has been checking her blood sugars at home which have been as high as the 500s a couple of days ago but then had dropped under 200 for the past 24 hours. In addition to her pain medications she had started Decadron  yesterday. Today, her pain had become progressively worse as well as some generalized weakness. She had also developed some altered mental status and her home pain medications were not helping with her symptoms so her daughter elected to activate EMS. In the ED, her labs are consistent with DKA with significant AKI as well as a significant lactic acidosis. She is starting the DKA protocol. She has a leukocytosis and elevated procalcitonin has been given Zosyn and vancomycin . CT of the abdomen is pending as she had been having some increasing abdominal swelling. Potassium was markedly high with some significant EKG changes and she was given calcium gluconate and bicarbonate in the ED in addition to starting the insulin  drip. Given her mother's intractable symptoms, the patient's daughter elected to revoke her hospice status and she is being admitted for further management. I was able to speak with her daughter by phone. She states her mom had been able to drink some fluids but had not been eating. She had had generalized weakness and a near fall while attempting to walk today. Her mental status is normally quite good  but became more altered today. She has a long history of diabetes requiring insulin  therapy and but has never had DKA before. No prior history of known kidney disease.  Hospital Course:     Ms. Kathryn Walsh was admitted to ICU on insulin  infusion. Despite insulin  gtt and fluids, potassium and lactate continue to rise. I discussed events with daughter, discussed that her mother appears uncomfortable and is likely actively dying. She wishes to transition to comfort measures, agreeable to HIP.  BP (!) 125/58 (BP Location: Right Upper Arm, Patient Position: Lying)   Pulse 71   Temp (!) 96.4 F (35.8 C) (Bladder)   Resp (!) 27  Ht 1.702 m (5' 7)   Wt 65.7 kg (144 lb 13.5 oz)   SpO2 100%   BMI 22.69 kg/m   Physical Exam Constitutional:      Appearance: She is ill-appearing.  HENT:     Head: Normocephalic.     Nose: Nose normal.  Eyes:     Extraocular Movements: Extraocular movements intact.     Pupils: Pupils are equal, round, and reactive to light.  Cardiovascular:     Rate and Rhythm: Tachycardia present.  Pulmonary:     Breath sounds: No wheezing.     Comments: Increased RR  Abdominal:     Palpations: Abdomen is soft.     Tenderness: There is no abdominal tenderness.  Musculoskeletal:        General: Normal range of motion.  Skin:    General: Skin is warm and dry.     Capillary Refill: Capillary refill takes less than 2 seconds.  Neurological:     General: No focal deficit present.     Post Hospital Care   Activity:   Weight Bearing Status:          Oxygen Orders for Discharge: O2 Device: None (Room air) SpO2: 100 %  Diet: Diet and Nourishment Orders (From admission, onward)     Start       03/25/24 2245  NPO Effective Now  Diet effective now       Question:  CCM Protocol Order  Answer:  Yes              Wound Care Recommendations: None   Lines/Drains/Airways: Patient Lines/Drains/Airways Status     Active LDAs     Name Placement date  Placement time Site Days   Implanted Port Chest --  --  Chest  --   Peripheral IV 20 G Anterior;Right Forearm 03/25/24  2026  Forearm *  less than 1   Peripheral IV 20 G Left Antecubital 03/25/24  2040  Antecubital *  less than 1   Peripheral IV 20 G Right Antecubital 2024/03/29  0100  Antecubital  less than 1            Therapy Recommendations:  PT:                  OT:            SLP:              Home Health Orders: DME Orders (From admission, onward)    None      Home Health Agency     None       I spent 40 minutes performing discharge services.   Electronically signed: Era No, MD 03-29-2024 / 1:17 PM *Some images could not be shown.

## 2024-03-29 NOTE — Discharge Summary (Signed)
 [employer] [facility]  [facility] Inpatient Discharge Summary  PCP: No primary care provider on file. Discharge Details   Admit date:         19-Apr-2024 Discharge date:        04/19/24  Hospital Days:    1 days  Code Status:   No CPR Advanced Directives on file: Living Will Teacher, music of Desire for Natural Death), Healthcare Power of Attorney     Living Will (Declaration of Desire for Natural Death): No, copy requested from patient  Discharge Diagnoses:  Active Problems:   * No active hospital problems. *   Task list for follow-up: N/a   Follow-Up Appointments Suggested: No follow-up provider specified. Follow-Up Appointments Already Scheduled: No future appointments.  Discharge Medications:   Current Discharge Medication List    You have not been prescribed any medications.     Allergies: No Known Allergies  Consultations this Admission: None  Procedures/Imaging:     No orders to display    Pertinent Labs:  Cardiac Labs: No results for input(s): CK, CKMB, CTNI, BNP in the last 168 hours. CBC: Recent Labs    Units 03/25/24 2038  WBC thou/mcL 20.5*  HGB gm/dL 9.3*  PLT thou/mcL 88*   BMP: Recent Labs    Units 2024-04-19 0856 04-19-24 0415 2024/04/19 0016 03/25/24 2039 03/25/24 2038 03/25/24 2024  NA mmol/L 132* 134* 131*  --  127*  --   K mmol/L 8.4* 7.7* 8.3* 7.5* 7.6*  --   CL mmol/L 93* 92* 92*  --  89*  --   CO2 mmol/L 7* 5* 4*  --  5*  --   BUN mg/dL 889* 887* 891*  --  894*  --   CREATININE mg/dL 5.29* 5.29* 5.43*  --  4.83* 5.3*  MAGNESIUM  mg/dL 2.9*  --  3.2*  --  3.3*  --    Lipid Panel: No results for input(s): CHOL, TRIG, HDL, LDL in the last 168 hours. Liver Enzymes: Recent Labs    Units 04/19/24 0016 03/25/24 2038  INR   --  2.6  AST U/L 280* 118*  ALT U/L 118* 101*  ALKPHOS U/L 559* 667*  BILITOT mg/dL 6.0* 6.8*   Endocrine Panels: Recent Labs    Units 04/19/24 1054  April 19, 2024 0946 2024/04/19 0856 2024/04/19 0835 2024/04/19 0716 April 19, 2024 0607 2024-04-19 0554 04/19/24 0455 April 19, 2024 0415 Apr 19, 2024 0406 04-19-2024 0303 2024-04-19 0204 03/25/24 2238 03/25/24 2038  TSH mcIU/mL  --   --   --   --   --   --   --   --   --   --   --   --   --  0.85  GLUCOSE mg/dL 838* 812* 807* 805* 770* 235* 273* 289* 295* 313* 368* 425*   < > 574*   < > = values in this interval not displayed.    Hospital Course   Physicians involved in care during this hospitalization Attending Provider: Greig Pair, MD Admitting Provider: Greig Pair, MD  HPI per admitting provider: Patient is a 68 year old female with a history of liver transplant in 1999 as well as insulin -dependent diabetes. She had unfortunately been diagnosed with small cell lung cancer in February of this year which was evaluated both at [facility] and at [facility]. She had been given 1 round of chemotherapy which was very poorly tolerated and had elected to discontinue treatment. She was discharged to home with hospice assistance. She had been living with her daughter. She had  had difficulty with intractable pain not responding to outpatient medications for quite some time now which have gotten progressively worse. She had also had poor p.o. intake and worsening weakness. Approximately 3 days ago she began having some diarrhea and had also had some blood in her urine. Her daughter has been checking her blood sugars at home which have been as high as the 500s a couple of days ago but then had dropped under 200 for the past 24 hours. In addition to her pain medications she had started Decadron  yesterday. Today, her pain had become progressively worse as well as some generalized weakness. She had also developed some altered mental status and her home pain medications were not helping with her symptoms so her daughter elected to activate EMS. In the ED, her labs are consistent with DKA with significant AKI as well as a significant  lactic acidosis. She is starting the DKA protocol. She has a leukocytosis and elevated procalcitonin has been given Zosyn and vancomycin . CT of the abdomen is pending as she had been having some increasing abdominal swelling. Potassium was markedly high with some significant EKG changes and she was given calcium gluconate and bicarbonate in the ED in addition to starting the insulin  drip. Given her mother's intractable symptoms, the patient's daughter elected to revoke her hospice status and she is being admitted for further management. I was able to speak with her daughter by phone. She states her mom had been able to drink some fluids but had not been eating. She had had generalized weakness and a near fall while attempting to walk today. Her mental status is normally quite good but became more altered today. She has a long history of diabetes requiring insulin  therapy and but has never had DKA before. No prior history of known kidney disease.    Hospital Course:    Kathryn Walsh was admitted to ICU on insulin  infusion. Despite insulin  gtt and fluids, potassium and lactate continue to rise. I discussed events with daughter, discussed that her mother appears uncomfortable and is likely actively dying. We transitioned to comfort measures after discussing options with family. Ms Strothman died shortly after transition to comfort care.   There were no vitals taken for this visit.  Physical Exam Vitals reviewed.     Post Hospital Care   Activity:   Weight Bearing Status:          Oxygen Orders for Discharge: O2 Device: None (Room air) SpO2: 100 %  Diet: Diet and Nourishment Orders (From admission, onward)    None       Wound Care Recommendations: None   Lines/Drains/Airways: Patient Lines/Drains/Airways Status     Active LDAs     Name Placement date Placement time Site Days   Implanted Port Chest --  --  Chest  --   Peripheral IV 20 G Anterior;Right Forearm 03/25/24  2026   Forearm *  less than 1   Peripheral IV 20 G Left Antecubital 03/25/24  2040  Antecubital *  less than 1   Peripheral IV 20 G Right Antecubital 2024/04/19  0100  Antecubital  less than 1            Therapy Recommendations:  PT:                  OT:            SLP:              Home Health Orders: DME Orders (  From admission, onward)    None      Home Health Agency     None       I spent 15 minutes performing discharge services.   Electronically signed: Era No, MD 04/13/24 / 2:05 PM *Some images could not be shown.

## 2024-03-29 NOTE — Progress Notes (Addendum)
 [facility]ICAL CENTER Case Management     Patient:   Kathryn Walsh MR Number:  [id number] Patient Date of Birth: [birth year] Age/Sex:  68 y.o./female     CM self consulted after chart review due to pt having hospice services at home. This consult has been since cleared by this Clinical research associate. Message from [employer] from [facility] [place]. Pt is active with the agency and can be accepted HIP to their facility [facility]. Unfortunately, per provider pt is not stable for transfer to the hospice home and will be HIP at [facility]. Per Jenne Spatz from the agency will be here shortly to see the pt.   Addendum ( 1401) Met with Roxanne from [facility]. She will meet with the pt/family. Of note, it looks like pt was placed on HIP with [facility]. Called Zebedee Drier from [facility] (expedited review), notified her that the pt is active with [facility]. She will come up and speak with Roxanne. Notified Cheri from [facility] via [facility] of situation. There was no CM consult placed for HIP/hospice referral , no palliative care consult.or order for expedited hospice referral. CM to follow which agency pt will be with for HIP care.       Electronically signed: Nat Hines, RN Apr 24, 2024 / 1:43 PM

## 2024-03-29 DEATH — deceased

## 2024-03-30 ENCOUNTER — Ambulatory Visit

## 2024-03-30 ENCOUNTER — Ambulatory Visit: Admitting: Internal Medicine

## 2024-03-30 ENCOUNTER — Other Ambulatory Visit

## 2024-03-31 ENCOUNTER — Ambulatory Visit: Admitting: Internal Medicine

## 2024-03-31 ENCOUNTER — Ambulatory Visit

## 2024-03-31 ENCOUNTER — Other Ambulatory Visit

## 2024-04-01 ENCOUNTER — Inpatient Hospital Stay

## 2024-04-01 ENCOUNTER — Ambulatory Visit

## 2024-04-02 ENCOUNTER — Ambulatory Visit

## 2024-04-06 ENCOUNTER — Encounter

## 2024-04-06 ENCOUNTER — Other Ambulatory Visit

## 2024-04-08 ENCOUNTER — Ambulatory Visit: Admitting: Internal Medicine

## 2024-04-08 ENCOUNTER — Other Ambulatory Visit

## 2024-04-08 ENCOUNTER — Ambulatory Visit

## 2024-04-09 ENCOUNTER — Ambulatory Visit

## 2024-04-10 ENCOUNTER — Ambulatory Visit

## 2024-04-13 ENCOUNTER — Other Ambulatory Visit

## 2024-04-15 ENCOUNTER — Inpatient Hospital Stay

## 2024-04-20 ENCOUNTER — Ambulatory Visit

## 2024-04-20 ENCOUNTER — Ambulatory Visit: Admitting: Physician Assistant

## 2024-04-20 ENCOUNTER — Other Ambulatory Visit

## 2024-04-21 ENCOUNTER — Ambulatory Visit

## 2024-04-22 ENCOUNTER — Ambulatory Visit

## 2024-04-22 ENCOUNTER — Inpatient Hospital Stay

## 2024-04-28 ENCOUNTER — Ambulatory Visit

## 2024-04-28 ENCOUNTER — Ambulatory Visit: Admitting: Physician Assistant

## 2024-04-28 ENCOUNTER — Other Ambulatory Visit

## 2024-04-29 ENCOUNTER — Ambulatory Visit

## 2024-04-30 ENCOUNTER — Ambulatory Visit

## 2024-05-22 ENCOUNTER — Encounter: Payer: Self-pay | Admitting: Internal Medicine

## 2024-07-13 ENCOUNTER — Encounter: Payer: Medicare HMO | Admitting: Internal Medicine

## 2024-10-21 ENCOUNTER — Ambulatory Visit: Payer: Medicare HMO
